# Patient Record
Sex: Male | Born: 1935 | Race: White | Hispanic: No | Marital: Married | Smoking: Former smoker
Health system: Southern US, Community
[De-identification: ages and names within clinical notes are randomized; demographics above are authoritative.]

## PROBLEM LIST (undated history)

## (undated) DIAGNOSIS — K219 Gastro-esophageal reflux disease without esophagitis: Secondary | ICD-10-CM

## (undated) DIAGNOSIS — E785 Hyperlipidemia, unspecified: Secondary | ICD-10-CM

## (undated) DIAGNOSIS — F32A Depression, unspecified: Secondary | ICD-10-CM

## (undated) DIAGNOSIS — I482 Chronic atrial fibrillation, unspecified: Secondary | ICD-10-CM

## (undated) DIAGNOSIS — Z8659 Personal history of other mental and behavioral disorders: Secondary | ICD-10-CM

## (undated) DIAGNOSIS — I10 Essential (primary) hypertension: Secondary | ICD-10-CM

## (undated) DIAGNOSIS — N183 Chronic kidney disease, stage 3 unspecified: Secondary | ICD-10-CM

## (undated) DIAGNOSIS — F329 Major depressive disorder, single episode, unspecified: Secondary | ICD-10-CM

## (undated) DIAGNOSIS — I5032 Chronic diastolic (congestive) heart failure: Secondary | ICD-10-CM

## (undated) DIAGNOSIS — I714 Abdominal aortic aneurysm, without rupture, unspecified: Secondary | ICD-10-CM

## (undated) DIAGNOSIS — C801 Malignant (primary) neoplasm, unspecified: Secondary | ICD-10-CM

## (undated) DIAGNOSIS — I2510 Atherosclerotic heart disease of native coronary artery without angina pectoris: Secondary | ICD-10-CM

## (undated) DIAGNOSIS — I495 Sick sinus syndrome: Secondary | ICD-10-CM

## (undated) DIAGNOSIS — I739 Peripheral vascular disease, unspecified: Secondary | ICD-10-CM

## (undated) DIAGNOSIS — J449 Chronic obstructive pulmonary disease, unspecified: Secondary | ICD-10-CM

## (undated) DIAGNOSIS — G4730 Sleep apnea, unspecified: Secondary | ICD-10-CM

## (undated) DIAGNOSIS — S82899A Other fracture of unspecified lower leg, initial encounter for closed fracture: Secondary | ICD-10-CM

## (undated) HISTORY — DX: Abdominal aortic aneurysm, without rupture, unspecified: I71.40

## (undated) HISTORY — PX: CORONARY ARTERY BYPASS GRAFT: SHX141

## (undated) HISTORY — PX: ABDOMINAL AORTIC ANEURYSM REPAIR: SUR1152

## (undated) HISTORY — DX: Chronic kidney disease, stage 3 unspecified: N18.30

## (undated) HISTORY — PX: INSERT / REPLACE / REMOVE PACEMAKER: SUR710

## (undated) HISTORY — DX: Chronic kidney disease, stage 3 (moderate): N18.3

## (undated) HISTORY — DX: Chronic obstructive pulmonary disease, unspecified: J44.9

## (undated) HISTORY — PX: TRIGGER FINGER RELEASE: SHX641

## (undated) HISTORY — DX: Sleep apnea, unspecified: G47.30

## (undated) HISTORY — DX: Malignant (primary) neoplasm, unspecified: C80.1

## (undated) HISTORY — DX: Hyperlipidemia, unspecified: E78.5

## (undated) HISTORY — DX: Gastro-esophageal reflux disease without esophagitis: K21.9

## (undated) HISTORY — PX: ANKLE SURGERY: SHX546

## (undated) HISTORY — DX: Sick sinus syndrome: I49.5

## (undated) HISTORY — PX: OTHER SURGICAL HISTORY: SHX169

## (undated) HISTORY — DX: Essential (primary) hypertension: I10

## (undated) HISTORY — DX: Peripheral vascular disease, unspecified: I73.9

## (undated) HISTORY — DX: Chronic diastolic (congestive) heart failure: I50.32

## (undated) HISTORY — DX: Atherosclerotic heart disease of native coronary artery without angina pectoris: I25.10

## (undated) HISTORY — DX: Other fracture of unspecified lower leg, initial encounter for closed fracture: S82.899A

## (undated) HISTORY — PX: FOOT SURGERY: SHX648

## (undated) HISTORY — DX: Chronic atrial fibrillation, unspecified: I48.20

## (undated) HISTORY — DX: Abdominal aortic aneurysm, without rupture: I71.4

---

## 1997-08-19 ENCOUNTER — Inpatient Hospital Stay (HOSPITAL_COMMUNITY): Admission: EM | Admit: 1997-08-19 | Discharge: 1997-08-30 | Payer: Self-pay | Admitting: Cardiology

## 1997-10-01 ENCOUNTER — Inpatient Hospital Stay (HOSPITAL_COMMUNITY): Admission: RE | Admit: 1997-10-01 | Discharge: 1997-10-06 | Payer: Self-pay | Admitting: Vascular Surgery

## 1997-11-16 ENCOUNTER — Emergency Department (HOSPITAL_COMMUNITY): Admission: EM | Admit: 1997-11-16 | Discharge: 1997-11-16 | Payer: Self-pay | Admitting: Emergency Medicine

## 2002-12-30 ENCOUNTER — Encounter: Payer: Self-pay | Admitting: Neurosurgery

## 2003-01-01 ENCOUNTER — Inpatient Hospital Stay (HOSPITAL_COMMUNITY): Admission: RE | Admit: 2003-01-01 | Discharge: 2003-01-02 | Payer: Self-pay | Admitting: Neurosurgery

## 2003-01-01 ENCOUNTER — Encounter: Payer: Self-pay | Admitting: Neurosurgery

## 2003-09-26 ENCOUNTER — Other Ambulatory Visit: Payer: Self-pay

## 2005-01-19 ENCOUNTER — Ambulatory Visit: Payer: Self-pay | Admitting: Chiropractic Medicine

## 2005-02-25 ENCOUNTER — Ambulatory Visit: Payer: Self-pay | Admitting: Family Medicine

## 2005-12-13 ENCOUNTER — Inpatient Hospital Stay: Payer: Self-pay | Admitting: Internal Medicine

## 2005-12-14 ENCOUNTER — Inpatient Hospital Stay (HOSPITAL_COMMUNITY): Admission: AD | Admit: 2005-12-14 | Discharge: 2005-12-16 | Payer: Self-pay | Admitting: *Deleted

## 2005-12-15 ENCOUNTER — Encounter: Payer: Self-pay | Admitting: Cardiovascular Disease

## 2006-07-23 ENCOUNTER — Emergency Department: Payer: Self-pay | Admitting: Emergency Medicine

## 2007-01-15 ENCOUNTER — Ambulatory Visit: Payer: Self-pay | Admitting: Specialist

## 2007-01-15 ENCOUNTER — Other Ambulatory Visit: Payer: Self-pay

## 2007-01-22 ENCOUNTER — Inpatient Hospital Stay: Payer: Self-pay | Admitting: Internal Medicine

## 2007-02-19 ENCOUNTER — Inpatient Hospital Stay: Payer: Self-pay | Admitting: Specialist

## 2007-03-06 ENCOUNTER — Ambulatory Visit: Payer: Self-pay | Admitting: Unknown Physician Specialty

## 2007-03-13 ENCOUNTER — Ambulatory Visit: Payer: Self-pay | Admitting: *Deleted

## 2007-06-16 ENCOUNTER — Inpatient Hospital Stay: Payer: Self-pay | Admitting: Internal Medicine

## 2007-06-16 ENCOUNTER — Other Ambulatory Visit: Payer: Self-pay

## 2007-06-21 ENCOUNTER — Encounter: Payer: Self-pay | Admitting: Cardiovascular Disease

## 2007-06-21 ENCOUNTER — Encounter: Payer: Self-pay | Admitting: Internal Medicine

## 2007-09-10 ENCOUNTER — Emergency Department: Payer: Self-pay | Admitting: Emergency Medicine

## 2007-09-10 ENCOUNTER — Other Ambulatory Visit: Payer: Self-pay

## 2008-01-05 ENCOUNTER — Emergency Department: Payer: Self-pay | Admitting: Emergency Medicine

## 2008-04-23 ENCOUNTER — Inpatient Hospital Stay: Payer: Self-pay | Admitting: Internal Medicine

## 2008-04-30 ENCOUNTER — Ambulatory Visit: Payer: Self-pay | Admitting: Otolaryngology

## 2008-05-15 ENCOUNTER — Inpatient Hospital Stay (HOSPITAL_COMMUNITY): Admission: AD | Admit: 2008-05-15 | Discharge: 2008-05-17 | Payer: Self-pay | Admitting: Cardiovascular Disease

## 2008-05-16 ENCOUNTER — Encounter: Payer: Self-pay | Admitting: Internal Medicine

## 2008-05-16 ENCOUNTER — Encounter: Payer: Self-pay | Admitting: Cardiovascular Disease

## 2008-06-12 ENCOUNTER — Encounter: Payer: Self-pay | Admitting: Cardiovascular Disease

## 2008-09-18 ENCOUNTER — Encounter: Payer: Self-pay | Admitting: Cardiovascular Disease

## 2008-09-19 ENCOUNTER — Encounter: Payer: Self-pay | Admitting: Internal Medicine

## 2008-09-22 ENCOUNTER — Encounter: Payer: Self-pay | Admitting: Cardiovascular Disease

## 2008-12-29 ENCOUNTER — Observation Stay: Payer: Self-pay | Admitting: Internal Medicine

## 2009-01-01 ENCOUNTER — Encounter: Payer: Self-pay | Admitting: Cardiovascular Disease

## 2009-02-02 ENCOUNTER — Encounter: Payer: Self-pay | Admitting: Family Medicine

## 2009-02-09 ENCOUNTER — Encounter: Payer: Self-pay | Admitting: Family Medicine

## 2009-03-11 ENCOUNTER — Encounter: Payer: Self-pay | Admitting: Family Medicine

## 2009-06-12 ENCOUNTER — Ambulatory Visit: Payer: Self-pay | Admitting: Cardiovascular Disease

## 2009-06-12 ENCOUNTER — Telehealth: Payer: Self-pay | Admitting: Cardiovascular Disease

## 2009-06-12 DIAGNOSIS — I509 Heart failure, unspecified: Secondary | ICD-10-CM | POA: Insufficient documentation

## 2009-06-12 DIAGNOSIS — I4891 Unspecified atrial fibrillation: Secondary | ICD-10-CM | POA: Insufficient documentation

## 2009-06-12 DIAGNOSIS — I10 Essential (primary) hypertension: Secondary | ICD-10-CM | POA: Insufficient documentation

## 2009-06-12 DIAGNOSIS — I25810 Atherosclerosis of coronary artery bypass graft(s) without angina pectoris: Secondary | ICD-10-CM

## 2009-06-12 DIAGNOSIS — I714 Abdominal aortic aneurysm, without rupture, unspecified: Secondary | ICD-10-CM | POA: Insufficient documentation

## 2009-06-12 DIAGNOSIS — E785 Hyperlipidemia, unspecified: Secondary | ICD-10-CM | POA: Insufficient documentation

## 2009-06-12 DIAGNOSIS — Z950 Presence of cardiac pacemaker: Secondary | ICD-10-CM | POA: Insufficient documentation

## 2009-06-12 DIAGNOSIS — K219 Gastro-esophageal reflux disease without esophagitis: Secondary | ICD-10-CM

## 2009-06-12 DIAGNOSIS — J449 Chronic obstructive pulmonary disease, unspecified: Secondary | ICD-10-CM

## 2009-06-12 DIAGNOSIS — J4489 Other specified chronic obstructive pulmonary disease: Secondary | ICD-10-CM | POA: Insufficient documentation

## 2009-06-15 ENCOUNTER — Encounter: Payer: Self-pay | Admitting: Cardiovascular Disease

## 2009-06-22 ENCOUNTER — Ambulatory Visit: Payer: Self-pay | Admitting: Internal Medicine

## 2009-07-20 ENCOUNTER — Encounter: Payer: Self-pay | Admitting: Internal Medicine

## 2009-08-04 ENCOUNTER — Encounter: Payer: Self-pay | Admitting: Cardiovascular Disease

## 2009-08-06 ENCOUNTER — Emergency Department: Payer: Medicare Other | Admitting: Emergency Medicine

## 2009-08-13 ENCOUNTER — Emergency Department: Payer: Medicare Other | Admitting: Emergency Medicine

## 2009-10-15 ENCOUNTER — Ambulatory Visit: Payer: Self-pay | Admitting: Cardiovascular Disease

## 2009-10-24 ENCOUNTER — Inpatient Hospital Stay: Payer: Medicare Other | Admitting: Psychiatry

## 2009-11-15 ENCOUNTER — Ambulatory Visit: Payer: Self-pay | Admitting: Internal Medicine

## 2009-11-15 ENCOUNTER — Observation Stay (HOSPITAL_COMMUNITY): Admission: EM | Admit: 2009-11-15 | Discharge: 2009-11-16 | Payer: Self-pay | Admitting: Emergency Medicine

## 2010-04-21 ENCOUNTER — Encounter: Payer: Self-pay | Admitting: Cardiovascular Disease

## 2010-05-09 LAB — CONVERTED CEMR LAB
BUN: 22 mg/dL
Basophils Relative: 1 %
Eosinophils Relative: 4 %
Glucose, Bld: 104 mg/dL
HCT: 35.7 %
Hemoglobin: 12 g/dL
Lymphocytes, automated: 26 %
MCV: 86 fL
Monocytes Relative: 12 %
WBC: 7.1 10*3/uL

## 2010-05-11 NOTE — Assessment & Plan Note (Signed)
Summary: NEP/AMD   Visit Type:  New EP pt Referring Provider:  Mariah Milling Primary Provider:  Maxwell Marion  CC:  no cp; some shortness of breath and no edema .  History of Present Illness: 77 year old gentleman known to me from [facility] with a history of coronary artery disease, bypass surgery in 1999 with catheterization in 2007 with patent LIMA to the LAD, occluded vein graft to the OM, history of Cypher stent to the PL branch and mid RCA in September 07, ejection fraction 45-50%, chronic atrial fibrillation with Medtronic pacemaker not on Coumadin due to recurrent falls and injury, possible alcohol abuse. Pacemaker was placed by Dr. Lynnea Ferrier on 25 2010 for syncope, AFib with pauses. The patient states that his episodes of syncope have improved since his PPM was placed.  No other complaints. He states that he is trying to reduce his ETOH intake.   Current Problems (verified): 1)  Aaa  (ICD-441.4) 2)  Essential Hypertension  (ICD-401.9) 3)  Hyperlipidemia  (ICD-272.4) 4)  Gerd  (ICD-530.81) 5)  COPD  (ICD-496) 6)  Accidental Falls, Recurrent  (ICD-E888.9) 7)  Pacemaker, Permanent  (ICD-V45.01) 8)  Atrial Fibrillation  (ICD-427.31) 9)  Coronary Atherosclerosis of Artery Bypass Graft  (ICD-414.04) 10)  CHF  (ICD-428.0)  Current Medications (verified): 1)  Omeprazole 20 Mg Cpdr (Omeprazole) .[name].. 1 Tab Daily 2)  Aspirin 81 Mg Chew (Aspirin) .[name].. 1 Tab Daily 3)  Cartia Xt 180 Mg Xr24h-Cap (Diltiazem Hcl Coated Beads) .[name].. 1 Tab Daily 4)  Prozac 20 Mg Caps (Fluoxetine Hcl) .[name].. 1 Tab Daily 5)  Metoprolol Succinate 50 Mg Xr24h-Tab (Metoprolol Succinate) .... Take One Tablet By Mouth Daily 6)  Crestor 5 Mg Tabs (Rosuvastatin Calcium) .... Take One Tablet By Mouth 4 Times A Week  Allergies (verified): 1)  ! Lipitor  Past History:  Past Medical History: Last updated: 06/12/2009 CAD -- bypass 1999 CHF - ECHO 09/2008 EF 45-50% chronic a fib, sp pacemaker  implantation witrh medtronic sensia recurrent falls, not a coumadin candidate COPD GERD hyperlipidemia HTN AAA --repair 1999  Past Surgical History: Last updated: 06/12/2009 CABG x2 1999 cardiac cath 2007: patent LIMA to the LAD and an occluded saphenous vein graft to an OM h/o cyper stent placement to a PL branch into the mid RCA in Sept. AAA repair L hip replacement appendectomy tonsilectomy pacemaker  Risk Factors: Alcohol Use: 1 (06/12/2009) Caffeine Use: 2 (06/12/2009) Exercise: no (06/12/2009)  Risk Factors: Smoking Status: quit > 6 months (06/12/2009) Packs/Day: 1.5 (06/12/2009)  Review of Systems  The patient denies chest pain, syncope, dyspnea on exertion, and peripheral edema.    Vital Signs:  Patient profile:   77 year old male Height:      73 inches Weight:      236.50 pounds BMI:     31.32 Pulse rate:   78 / minute Pulse rhythm:   regular BP sitting:   124 / 76  (left arm) Cuff size:   regular  Vitals Entered By: [name] (June 22, 2009 10:52 AM)  Physical Exam  General:  tall elderly gentleman in no apparent distress, HEENT exam is benign.  neck is supple with no JVP or carotid bruits  heart sounds are regular with S1-S2 and no murmurs appreciated lungs are clear to auscultation with no wheezes Rales. Well healed PPM incision.  abdominal exam is benign heart for morbid obesity, no significant lower extremity pulses are equal and symmetrical in his upper and lower extremities, neurologic exam  is grossly nonfocal, skin is warm and dry. He was able to walk without a cane though had slight balance instability.   PPM Specifications Following MD:  Lewayne Bunting, MD     PPM Vendor:  Medtronic     PPM Model Number:  [id number]     PPM Serial Number:  [id number] PPM DOI:  05/16/2008     PPM Implanting MD:  NOT IMPLANTED BY [place]  Lead 1    Location: RV     DOI: 05/16/2008     Model #: [id number]     Serial #: [id number]     Status: active  Magnet Response  Rate:  BOL 85 ERI  65  Indications:  A-fib   PPM Follow Up Remote Check?  No Battery Voltage:  2.78 V     Battery Est. Longevity:  6.5 years     Pacer Dependent:  No     Right Ventricle  Amplitude: 15.68 mV, Impedance: 613 ohms, Threshold: 0.75 V at 0.4 msec  Episodes Coumadin:  No Ventricular Pacing:  76.2%  Parameters Mode:  VVIR     Lower Rate Limit:  60     Upper Rate Limit:  130 Next Cardiology Appt Due:  12/10/2009 Tech Comments:  RV 2.5@ 0.4.  Device function normal.  The patient does c/o some soreness at the site when he lays on that side.  Dr. Ladona Ridgel will address that with him.  ROV 6 months in the [place] [facility]. Altha Harm, LPN  June 22, 2009 11:10 AM  MD Comments:  Agree with above.  ICD Specifications Following MD:  Lewayne Bunting, MD     ICD Vendor:  Medtronic     Impression & Recommendations:  Problem # 1:  ATRIAL FIBRILLATION (ICD-427.31) He will continue his ASA.  He is not a coumadin candidate and his rate is well controlled. His updated medication list for this problem includes:    Aspirin 81 Mg Chew (Aspirin) .[name]... 1 tab daily    Metoprolol Succinate 50 Mg Xr24h-tab (Metoprolol succinate) .[name]... Take one tablet by mouth daily  Problem # 2:  ESSENTIAL HYPERTENSION (ICD-401.9) He will continue his current meds and maintain a low sodium diet. His updated medication list for this problem includes:    Aspirin 81 Mg Chew (Aspirin) .[name]... 1 tab daily    Cartia Xt 180 Mg Xr24h-cap (Diltiazem hcl coated beads) .[name]... 1 tab daily    Metoprolol Succinate 50 Mg Xr24h-tab (Metoprolol succinate) .[name]... Take one tablet by mouth daily  Problem # 3:  PACEMAKER, PERMANENT (ICD-V45.01) His device is working normally.  Will recheck in several months.  Patient Instructions: 1)  Your physician recommends that you schedule a follow-up appointment in: 6 months with [name] 2)  Your physician recommends that you continue on your current medications as directed. Please refer to the  Current Medication list given to you today.

## 2010-05-11 NOTE — Assessment & Plan Note (Signed)
Summary: [name]   Visit Type:  Follow-up Referring Provider:  Mariah Milling Primary Provider:  Maxwell Marion  CC:  c/o soreness at incision from pacemaker.[name]  History of Present Illness: 77 year old gentleman known to me from [facility] with a history of coronary artery disease, bypass surgery in 1999 with catheterization in 2007 with patent LIMA to the LAD, occluded vein graft to the OM, history of Cypher stent to the PL branch and mid RCA in September 07, ejection fraction 45-50%, chronic atrial fibrillation with Medtronic pacemaker not on Coumadin due to recurrent falls and injury, possible alcohol abuse. Pacemaker was placed by Dr. Lynnea Ferrier on 25 2010 for syncope, AFib with pauses.  The patient states that his episodes of syncope have improved since his PPM was placed.    he does have a history of alcohol abuse per his family though he denies that he drinks that much and reports that his gait instability and history of falls with fractures and trauma to his face has been due to poor balance. He denies any recent falls but he states that his legs and balance her stool poor. He usually walks with a cane though did not bring with him today.  Overall he has no new complaints. No symptoms of chest discomfort, no shortness of breath, no significant lower extremity edema. He does not do much during the daytime, does not exercise or get out of the house much. He states his wife is always getting on him to do something.   Current Medications (verified): 1)  Omeprazole 20 Mg Cpdr (Omeprazole) .[name].. 1 Tab Daily 2)  Aspirin 81 Mg Chew (Aspirin) .... Two Tablets Once Daily 3)  Diltiazem Hcl Cr 120 Mg Xr12h-Cap (Diltiazem Hcl) .[name].. 1 Once Daily 4)  Prozac 20 Mg Caps (Fluoxetine Hcl) .[name].. 1 Tab Daily 5)  Metoprolol Succinate 50 Mg Xr24h-Tab (Metoprolol Succinate) .... Take One Tablet By Mouth Daily 6)  Pravachol 20 Mg Tabs (Pravastatin Sodium) .[name].. 1 Once Daily  Allergies (verified): 1)   ! Lipitor  Past History:  Past Medical History: Last updated: 06/15/2009 CAD -- bypass 1999 CHF - ECHO 09/2008 EF 45-50% chronic a fib, sp pacemaker implantation witrh medtronic sensia recurrent falls, not a coumadin candidate COPD GERD hyperlipidemia HTN AAA --repair 1999  Past Surgical History: Last updated: 06/15/2009 CABG x2 1999 cardiac cath 2007: patent LIMA to the LAD and an occluded saphenous vein graft to an OM h/o cyper stent placement to a PL branch into the mid RCA in Sept. AAA repair L hip replacement appendectomy tonsilectomy pacemaker  Family History: Last updated: 2009-06-15 mother died of stomach CA at 73 father died at 9 of cerebral hemorrhage family hx of DM, HTN, CAD  Social History: Last updated: 15-Jun-2009 retired Scientist, product/process development married quit smoking in 1999 EtOH abuse  Risk Factors: Alcohol Use: 1 (2009-06-15) Caffeine Use: 2 (2009/06/15) Exercise: no (2009-06-15)  Risk Factors: Smoking Status: quit > 6 months (Jun 15, 2009) Packs/Day: 1.5 (June 15, 2009)  Review of Systems       The patient complains of difficulty walking.  The patient denies fever, weight loss, weight gain, vision loss, decreased hearing, hoarseness, chest pain, syncope, dyspnea on exertion, peripheral edema, prolonged cough, abdominal pain, incontinence, muscle weakness, depression, and enlarged lymph nodes.    Vital Signs:  Patient profile:   77 year old male Height:      73 inches Weight:      229 pounds BMI:     30.32 Pulse rate:   86 /  minute BP sitting:   144 / 87  (left arm) Cuff size:   regular  Vitals Entered By: Bishop Dublin, CMA (October 15, 2009 10:42 AM)  Physical Exam  General:  Well developed, well nourished, in no acute distress. Head:  normocephalic and atraumatic Neck:  Neck supple, no JVD. No masses, thyromegaly or abnormal cervical nodes. Lungs:  Clear bilaterally to auscultation and percussion. Heart:  Non-displaced PMI, chest non-tender;  regular rate and rhythm, S1, S2 without murmurs, rubs or gallops. Carotid upstroke normal, no bruit. Pedals normal pulses. No edema, no varicosities. Abdomen:  Bowel sounds positive; abdomen soft and non-tender without masses Msk:  Back normal, normal gait. Muscle strength and tone normal. Pulses:  mildly diminished pulses in his lower extremities Extremities:  No clubbing or cyanosis. Neurologic:  Alert and oriented x 3. Skin:  Intact without lesions or rashes. Psych:  Normal affect.    PPM Specifications Following MD:  Lewayne Bunting, MD     PPM Vendor:  Medtronic     PPM Model Number:  [id number]     PPM Serial Number:  [id number] PPM DOI:  05/16/2008     PPM Implanting MD:  NOT IMPLANTED BY [place]  Lead 1    Location: RV     DOI: 05/16/2008     Model #: [id number]     Serial #: [id number]     Status: active  Magnet Response Rate:  BOL 85 ERI  65  Indications:  A-fib   PPM Follow Up Pacer Dependent:  No      Episodes Coumadin:  No  Parameters Mode:  VVIR     Lower Rate Limit:  60     Upper Rate Limit:  130  ICD Specifications Following MD:  Lewayne Bunting, MD     ICD Vendor:  Medtronic     Impression & Recommendations:  Problem # 1:  CORONARY ATHEROSCLEROSIS OF ARTERY BYPASS GRAFT (ICD-414.04) no symptoms suggestive of angina. He is not very active and we have encouraged him to do more walking with his cane. He does have significant gait instability and history of falls. We will continue him on his current medication.  His updated medication list for this problem includes:    Aspirin 81 Mg Chew (Aspirin) .[name]..[name] Two tablets once daily    Diltiazem Hcl Cr 120 Mg Xr12h-cap (Diltiazem hcl) .[name]... 1 once daily    Metoprolol Succinate 50 Mg Xr24h-tab (Metoprolol succinate) .[name]... Take one tablet by mouth daily  Problem # 2:  HYPERLIPIDEMIA (ICD-272.4) We will try to obtain his most recent lipid panel. It is important that his cholesterol is less than 150, LDL less than 70 given his underlying  coronary artery disease and history of bypass.  The following medications were removed from the medication list:    Crestor 5 Mg Tabs (Rosuvastatin calcium) .[name]... Take one tablet by mouth 4 times a week His updated medication list for this problem includes:    Pravachol 20 Mg Tabs (Pravastatin sodium) .[name]... 1 once daily  Problem # 3:  ESSENTIAL HYPERTENSION (ICD-401.9) Blood pressure is reasonably well controlled on today's visit.  His updated medication list for this problem includes:    Aspirin 81 Mg Chew (Aspirin) .[name]..[name] Two tablets once daily    Diltiazem Hcl Cr 120 Mg Xr12h-cap (Diltiazem hcl) .[name]... 1 once daily    Metoprolol Succinate 50 Mg Xr24h-tab (Metoprolol succinate) .[name]... Take one tablet by mouth daily  Problem # 4:  PACEMAKER, PERMANENT (ICD-V45.01) He is followed  by Dr. Ladona Ridgel for his pacemaker. Recent evaluation suggests a pacemaker is working well. Mr. Sibert has no complaints of lightheadedness or dizziness.

## 2010-05-11 NOTE — Op Note (Signed)
Summary: Pacemaker  Pacemaker   Imported By: Harlon Flor 07/23/2009 10:56:13  _____________________________________________________________________  External Attachment:    Type:   Image     Comment:   External Document

## 2010-05-11 NOTE — Cardiovascular Report (Signed)
Summary: Cardiac Cath Other  Cardiac Cath Other   Imported By: Harlon Flor 06/15/2009 15:41:38  _____________________________________________________________________  External Attachment:    Type:   Image     Comment:   External Document

## 2010-05-11 NOTE — Assessment & Plan Note (Signed)
Summary: NP6/AMD   Visit Type:  Follow-up Primary Provider:  Maxwell Kennedy  CC:  no chest pain, SOB continues, no swelling since changing to salt substitute, and fell last night and lacerated eyebrow.  History of Present Illness: 77 year old gentleman known to me from [facility] with a history of coronary artery disease, bypass surgery in 1999 with catheterization in 2007 with patent LIMA to the LAD, occluded vein graft to the OM, history of Cypher stent to the PL branch and mid RCA in September 07, ejection fraction 45-50%, chronic itch or fibrillation with Medtronic pacemaker not on Coumadin due to recurrent falls and injury, possible alcohol abuse.  [name] has had admissions to [facility] for traumatic facial injury after falls. He presents again today after falling last night and has a laceration above his left eye. He did not seek medical attention. On previous falls, he's required plastic surgery for healing. His wife states that he drinks too much and wanted him "committed". He states that he does not drink much and that most of his falls come from his history of left hip repair and chronic right knee weakness.  Echocardiogram in June 2010 shows mildly dilated left atrium, ejection fraction 45-50%, diastolic relaxation abnormality, right ventricular systolic pressure 30-40 mm of mercury. Mild to moderate tricuspid regurgitation.  Stress test in June 2010 was a pharmacologic study showing no significant ischemia. Overall this was felt to be a low risk scan.  Pacemaker was placed by Dr. Lynnea Ferrier on 25 2010 for syncope, AFib with pauses. Medtronic serial number [id number]  Past catheterization in 9 2007.  Renal artery ultrasound in March 2009 was essentially normal.  Cholesterol in June 2010 shows total cholesterol 226, LDL 149, HDL 62, triglycerides 74. TSH 1.3 hemoglobin A1c 6.1  Preventive Screening-Counseling &  Management  Alcohol-Tobacco     Alcohol drinks/day: 1     Alcohol type: spirits     Smoking Status: quit > 6 months     Packs/Day: 1.5     Year Started: 1952     Year Quit: 1999  Caffeine-Diet-Exercise     Caffeine use/day: 2     Does Patient Exercise: no  Current Problems (verified): 1)  Aaa  (ICD-441.4) 2)  Essential Hypertension  (ICD-401.9) 3)  Hyperlipidemia  (ICD-272.4) 4)  Gerd  (ICD-530.81) 5)  COPD  (ICD-496) 6)  Accidental Falls, Recurrent  (ICD-E888.9) 7)  Pacemaker, Permanent  (ICD-V45.01) 8)  Atrial Fibrillation  (ICD-427.31) 9)  Coronary Atherosclerosis of Artery Bypass Graft  (ICD-414.04) 10)  CHF  (ICD-428.0)  Current Medications (verified): 1)  Omeprazole 20 Mg Cpdr (Omeprazole) .[name].. 1 Tab Daily 2)  Aspirin 81 Mg Chew (Aspirin) .[name].. 1 Tab Daily 3)  Cartia Xt 180 Mg Xr24h-Cap (Diltiazem Hcl Coated Beads) .[name].. 1 Tab Daily 4)  Prozac 20 Mg Caps (Fluoxetine Hcl) .[name].. 1 Tab Daily 5)  Metoprolol Succinate 50 Mg Xr24h-Tab (Metoprolol Succinate) .... Take One Tablet By Mouth Daily 6)  Crestor 5 Mg Tabs (Rosuvastatin Calcium) .... Take One Tablet By Mouth 2 Times A Week  Allergies (verified): 1)  ! Lipitor  Past History:  Family History: Last updated: 2009-07-12 mother died of stomach CA at 101 father died at 41 of cerebral hemorrhage family hx of DM, HTN, CAD  Social History: Last updated: July 12, 2009 retired Scientist, product/process development married quit smoking in 1999 EtOH abuse  Risk Factors: Alcohol Use: 1 (07-12-09) Caffeine Use: 2 (07-12-2009) Exercise: no (12-Jul-2009)  Risk Factors: Smoking  Status: quit > 6 months (06/12/2009) Packs/Day: 1.5 (06/12/2009)  Past Medical History: CAD -- bypass 1999 CHF - ECHO 09/2008 EF 45-50% chronic a fib, sp pacemaker implantation witrh medtronic sensia recurrent falls, not a coumadin candidate COPD GERD hyperlipidemia HTN AAA --repair 1999  Past Surgical History: CABG x2 1999 cardiac cath 2007: patent LIMA to  the LAD and an occluded saphenous vein graft to an OM h/o cyper stent placement to a PL branch into the mid RCA in Sept. AAA repair L hip replacement appendectomy tonsilectomy pacemaker  Family History: mother died of stomach CA at 71 father died at 11 of cerebral hemorrhage family hx of DM, HTN, CAD  Social History: retired Scientist, product/process development married quit smoking in 1999 EtOH abuse Alcohol drinks/day:  1 Smoking Status:  quit > 6 months Packs/Day:  1.5 Caffeine use/day:  2 Does Patient Exercise:  no  Review of Systems       The patient complains of chest pain.  The patient denies fever, weight loss, weight gain, vision loss, decreased hearing, hoarseness, syncope, dyspnea on exertion, peripheral edema, prolonged cough, headaches, abdominal pain, incontinence, muscle weakness, depression, and enlarged lymph nodes.         Rare epsodes of chest pain  Vital Signs:  Patient profile:   77 year old male Height:      73 inches Weight:      235.63 pounds BMI:     31.20 Pulse rate:   83 / minute Pulse rhythm:   regular BP sitting:   178 / 84  (left arm) Cuff size:   regular  Vitals Entered By: [name] (June 12, 2009 10:09 AM)  Physical Exam  General:  tall elderly gentleman in no apparent distress, HEENT exam is benign, laceration above his left eye that is new and 1 1/2 cm in length,otherwise alert and oriented, neck is supple with no JVP or carotid bruits, heart sounds are regular with S1-S2 and no murmurs appreciated, lungs are clear to auscultation with no wheezes Rales, abdominal exam is benign heart for morbid obesity, no significant lower extremity pulses are equal and symmetrical in his upper and lower extremities, neurologic exam is grossly nonfocal, skin is warm and dry. He was able to walk without a cane though had slight balance instability.   EKG  Procedure date:  06/12/2009  Findings:      Ventricular paced rhythm with ventricular rate 83 beats per  minute.  Impression & Recommendations:  Problem # 1:  ACCIDENTAL FALLS, RECURRENT (ICD-E888.9) His wife reports a long alcohol history. The patient denies that he drinks that much. He blames it on his left hip surgery and has chronic right knee weakness and discomfort. He frequently states that he falls from tripping over items at home. He uses his cane occasionally. We will continue to watch him. I suggested that he watches drinking. I suspect that he drinks more than he is letting on.  Problem # 2:  CORONARY ATHEROSCLEROSIS OF ARTERY BYPASS GRAFT (ICD-414.04) history of coronary artery disease as detailed above. He had a stress test in June 2010 that showed no ischemia. His cholesterol is not well-controlled and he only takes Crestor 2 times per week. He denies any significant myalgias. We have suggested that he increase the dose of his Crestor to 5 times per week and titrate this up to every day 5 mg if tolerated. His LFTs were normal in June of 2010.  His updated medication list for this problem includes:  Aspirin 81 Mg Chew (Aspirin) .[name]... 1 tab daily    Cartia Xt 180 Mg Xr24h-cap (Diltiazem hcl coated beads) .[name]... 1 tab daily    Metoprolol Succinate 50 Mg Xr24h-tab (Metoprolol succinate) .[name]... Take one tablet by mouth daily  Problem # 3:  ESSENTIAL HYPERTENSION (ICD-401.9) Blood pressure is elevated today he states that he has been missing doses of medications. He did not take his medications today. He is due to take metoprolol and Cartia. Rest and to monitor his blood pressures at home to mention that he is not running above goal. His updated medication list for this problem includes:    Aspirin 81 Mg Chew (Aspirin) .[name]... 1 tab daily    Cartia Xt 180 Mg Xr24h-cap (Diltiazem hcl coated beads) .[name]... 1 tab daily    Metoprolol Succinate 50 Mg Xr24h-tab (Metoprolol succinate) .[name]... Take one tablet by mouth daily  Problem # 4:  PACEMAKER, PERMANENT (ICD-V45.01) he is to have his pacemaker  checked in [place], last checked in September 2010. He's indicated that he would like to have it checked in [place] with Dr. Graciela Husbands. We will arrange this for him. His Cartia was increased on the last visit with Dr. Lynnea Ferrier for additional rate control.    Echocardiogram  Procedure date:  09/22/2008  Findings:      AF with intermittent ventricular paced rhythm Left ventricular systolic function is mildly reduced EF 45-50%  The trasmitral spectral Doppler flow pattern is suggestive of impraied LV relaxation Regional wall motion abnormality may reflect pacemaker activation There is a pacemaker lead in the R ventricle The LA is mildly dilated There is mild to moderate tricuspid regurg R ventricular systolic pressure is elevated at 33 mmHg Trivial pericardial effusion  Exercise Stress Test  Procedure date:  09/22/2008  Findings:      pharmacological myocardial perfusion imagine study with no evidence of significant ischemia.  There is a small to moderate sized region og scar noted in the mid to distal inferior and infero-apical region.  The post stress left ventricle is normal in size.  non gated study secondary to ectopy  pharmacological stress test without chest pain or EKG changes for ischemia. frequent PVC's. EKG is negative for ischemia.  no significant ischemia demonstrated.  region of scar noted.  this is a low risk scan.  prior study not available for comparsion.  Pacemaker Information  Procedure date:  05/16/2008  Findings:      implanted device: PPM generator- manufacturer: medtronic model # [id number], serial # [id number] Ventricular lead- manufacturer medtronicm model # [id number], serial # [id number]  Cardiac Cath  Procedure date:  12/15/2005  Findings:      successful placement of a Cypher 2.5x8 mm stent in the proximal posterolateral artery lesion successful placement of a Cypher 3.5x18 mm stent in the md right coronary artery stenotic lesion, ultimately post  dilated to 3.85 mm patent left internal mammary artery to the left anterior descending with diffuse apical left anterior descending totally occluded vein graft to the obtuse marginal significant 2 vessel coronary artery disease mild depressed left ventricle systolic function patent abdominal aortic graft eighty percent left renal artery stenosis adjuvant use of integrillin infusion  Renal US  Procedure date:  06/21/2007  Findings:      right and left renal arteries: no evidence of significant diameter reduction, dissection, tortuosity or any other vascular abnormality  right and left kidneys: essentially equal in size, symmetrical in shape with normal cortex and medulla nad no abnormal echogenicity or hydronephrosis  this is essentially a normal renal duplex Doppler evaluation  -  Date:  09/18/2008    WBC: 7.1    HGB: 12.0    HCT: 35.7    RBC: 4.17    PLT: 236    MCV: 86    RDW: 17.2    Neutrophil: 57    Lymphs: 26    Monos: 12    Eos: 4    Basophil: 1    BG Random: 104    BUN: 22    Creatinine: 1.33

## 2010-05-11 NOTE — Progress Notes (Signed)
Summary: FYI  Phone Note Call from Patient Call back at Home Phone [phone]   Caller: WIFE Call For: [facility] Summary of Call: PTS WIFE WOULD LIKE FOR DR Mariah Milling TO CALL HER BEFORE HE SEES MR [facility]-THERE IS SOMETHING THAT SHE WANTS TO TELL HIM BEFORE THE VISIT AND SHE WAS UNABLE TO COME WITH HIM Initial call taken by: Harlon Flor,  June 12, 2009 9:38 AM  Follow-up for Phone Call        drinking issue- last night pt fell because he was drinking, mind is gone especially at evening time, pts wife would like for him to have tests done "on his head"  Follow-up by: Charlena Cross, RN, BSN,  June 12, 2009 9:43 AM

## 2010-05-11 NOTE — Progress Notes (Signed)
Summary: PHI  PHI   Imported By: Harlon Flor 07/23/2009 10:56:36  _____________________________________________________________________  External Attachment:    Type:   Image     Comment:   External Document

## 2010-05-11 NOTE — Progress Notes (Signed)
Summary: PHI  PHI   Imported By: Harlon Flor 06/15/2009 15:46:47  _____________________________________________________________________  External Attachment:    Type:   Image     Comment:   External Document

## 2010-05-12 ENCOUNTER — Encounter: Payer: Self-pay | Admitting: Cardiovascular Disease

## 2010-05-14 ENCOUNTER — Encounter: Payer: Self-pay | Admitting: Cardiovascular Disease

## 2010-05-14 ENCOUNTER — Ambulatory Visit (INDEPENDENT_AMBULATORY_CARE_PROVIDER_SITE_OTHER): Payer: MEDICARE | Admitting: Cardiovascular Disease

## 2010-05-14 DIAGNOSIS — E785 Hyperlipidemia, unspecified: Secondary | ICD-10-CM

## 2010-05-14 DIAGNOSIS — I2510 Atherosclerotic heart disease of native coronary artery without angina pectoris: Secondary | ICD-10-CM

## 2010-05-19 NOTE — Assessment & Plan Note (Signed)
Summary: Surgical Clearance for Knee Surgery/AMD   Vital Signs:  Patient profile:   77 year old male Height:      73 inches Weight:      234 pounds BMI:     30.98 Pulse rate:   85 / minute BP sitting:   121 / 78  (left arm) Cuff size:   regular  Vitals Entered By: Bishop Dublin, CMA (May 14, 2010 2:12 PM)  Visit Type:  Follow-up Referring Provider:  Mariah Milling Primary Provider:  Maxwell Marion  CC:  Surgical clearance for right knee surg.  Occas. has some chest pain and shortness of breath. .  History of Present Illness: 77 year old gentleman with a history of coronary artery disease, bypass surgery in 1999 with catheterization in 2007 with patent LIMA to the LAD, occluded vein graft to the OM, history of Cypher stent to the PL branch and mid RCA in September 07, ejection fraction 45-50%, chronic atrial fibrillation with Medtronic pacemaker not on Coumadin due to recurrent falls and injury from alcohol abuse. Pacemaker was placed by Dr. Lynnea Ferrier on 25 2010 for syncope, AFib with pauses.  Mr. [name] reports having shortness of breath at baseline with exertion "because I'm out of shape ". He denies any chest pain. He does not do any exercise. He does not take very good care of himself in general. He has alcohol problems and his family has caught him hiding alcohol bottles throughout his house and workshop. After he drinks, he frequent falls down and hurts himself. Previously he has had a large laceration to the left eye. On his visit today, he has had a small injury to his left hand. he continues to drive though it is uncertain if he drives while intoxicated. His family is worried he will hurt somebody and hurt himself.  He has spent a month and day alcohol rehabilitation facility but did not followup with Alcoholics Anonymous after discharge. He reports it was expensive to stay at the rehabilitation facility. He is not interested in attending another alcohol rehabilitation at this time.  he  states that he needs to have right knee replacement as soon as possible secondary to severe pain. Has difficulty ambulating secondary to his symptoms. This has been going on for several years and getting worse.  EKG shows paced rhythm at 85 beats per minute   Current Medications (verified): 1)  Omeprazole 20 Mg Cpdr (Omeprazole) .[name].. 1 Tab Daily 2)  Aspirin 81 Mg Chew (Aspirin) .... Two Tablets Once Daily 3)  Diltiazem Hcl Cr 120 Mg Xr12h-Cap (Diltiazem Hcl) .[name].. 1 Once Daily 4)  Prozac 20 Mg Caps (Fluoxetine Hcl) .[name].. 1 Tab Daily 5)  Metoprolol Succinate 50 Mg Xr24h-Tab (Metoprolol Succinate) .... Take One Tablet By Mouth Daily 6)  Pravachol 20 Mg Tabs (Pravastatin Sodium) .[name].. 1 Once Daily 7)  Lisinopril 10 Mg Tabs (Lisinopril) .... One Tablet Once Daily  Allergies (verified): 1)  ! Lipitor  Past History:  Past Medical History: Last updated: 06/12/2009 CAD -- bypass 1999 CHF - ECHO 09/2008 EF 45-50% chronic a fib, sp pacemaker implantation witrh medtronic sensia recurrent falls, not a coumadin candidate COPD GERD hyperlipidemia HTN AAA --repair 1999  Past Surgical History: Last updated: 06/12/2009 CABG x2 1999 cardiac cath 2007: patent LIMA to the LAD and an occluded saphenous vein graft to an OM h/o cyper stent placement to a PL branch into the mid RCA in Sept. AAA repair L hip replacement appendectomy tonsilectomy pacemaker  Family History: Last updated: 06/12/2009 mother  died of stomach CA at 80 father died at 5 of cerebral hemorrhage family hx of DM, HTN, CAD  Social History: Last updated: 06/12/2009 retired Scientist, product/process development married quit smoking in 1999 EtOH abuse  Risk Factors: Alcohol Use: 1 (06/12/2009) Caffeine Use: 2 (06/12/2009) Exercise: no (06/12/2009)  Risk Factors: Smoking Status: quit > 6 months (06/12/2009) Packs/Day: 1.5 (06/12/2009)  Review of Systems       The patient complains of dyspnea on exertion and difficulty walking.  The  patient denies fever, weight loss, weight gain, vision loss, decreased hearing, hoarseness, chest pain, syncope, peripheral edema, prolonged cough, abdominal pain, incontinence, muscle weakness, depression, and enlarged lymph nodes.         severe right knee pain  Physical Exam  General:  Well developed, well nourished, in no acute distress. Head:  normocephalic and atraumatic Neck:  Neck supple, no JVD. No masses, thyromegaly or abnormal cervical nodes. Lungs:  Clear bilaterally to auscultation and percussion. Heart:  Non-displaced PMI, chest non-tender; regular rate and rhythm, S1, S2 without murmurs, rubs or gallops. Carotid upstroke normal, no bruit. Pedals normal pulses. No edema, no varicosities. Abdomen:  Bowel sounds positive; abdomen soft and non-tender without masses Msk:  Back normal, normal gait. Muscle strength and tone normal. Pulses:  mildly diminished pulses in his lower extremities Extremities:  No clubbing or cyanosis. Neurologic:  Alert and oriented x 3. Skin:  rash on his face and forehead Psych:  Normal affect.   Impression & Recommendations:  Problem # 1:  ACCIDENTAL FALLS, RECURRENT (ICD-E888.9) we had a long discussion about his falls and alcohol problem. He will continue to try to quit. His wife would like him to take a medication for his memory. I have suggested he talk with Dr. Sheppard Penton or possibly a neurologist.  he has had a long problem with alcohol for many years and a recent stay in rehabilitation did not help. He did not followup with Alcoholics Anonymous. Have encouraged him to recheck with Alcoholics Anonymous. I do not think that he would have alcohol withdrawal with his upcoming surgery on his knee.  Problem # 2:  PRE-OPERATIVE CARDIAC EXAM (ICD-V72.81) he would be an acceptable risk for his upcoming total knee replacement. No further testing is needed. Would continue him on his current outpatient regimen.  His updated medication list for this problem  includes:    Aspirin 81 Mg Chew (Aspirin) .[name]..[name] Two tablets once daily    Diltiazem Hcl Cr 120 Mg Xr12h-cap (Diltiazem hcl) .[name]... 1 once daily    Metoprolol Succinate 50 Mg Xr24h-tab (Metoprolol succinate) .[name]... Take one tablet by mouth daily    Lisinopril 10 Mg Tabs (Lisinopril) ..... One tablet once daily  Problem # 3:  HYPERLIPIDEMIA (ICD-272.4) We'll try to obtain his most recent lipid panel from Dr. Evelene Croon. Goal LDL is less than 70.[name]  His updated medication list for this problem includes:    Pravachol 20 Mg Tabs (Pravastatin sodium) .[name]... 1 once daily  Problem # 4:  ESSENTIAL HYPERTENSION (ICD-401.9) Blood pressure is adequate on today's visit. No further medication changes made.  His updated medication list for this problem includes:    Aspirin 81 Mg Chew (Aspirin) .[name]..[name] Two tablets once daily    Diltiazem Hcl Cr 120 Mg Xr12h-cap (Diltiazem hcl) .[name]... 1 once daily    Metoprolol Succinate 50 Mg Xr24h-tab (Metoprolol succinate) .[name]... Take one tablet by mouth daily    Lisinopril 10 Mg Tabs (Lisinopril) ..... One tablet once daily  Other Orders: EKG  w/ Interpretation (93000)   Orders Added: 1)  EKG w/ Interpretation [93000]

## 2010-05-20 ENCOUNTER — Encounter (INDEPENDENT_AMBULATORY_CARE_PROVIDER_SITE_OTHER): Payer: MEDICARE

## 2010-05-20 ENCOUNTER — Encounter: Payer: Self-pay | Admitting: Internal Medicine

## 2010-05-20 ENCOUNTER — Ambulatory Visit: Payer: Medicare Other | Admitting: Family Medicine

## 2010-05-20 DIAGNOSIS — I4891 Unspecified atrial fibrillation: Secondary | ICD-10-CM

## 2010-05-21 ENCOUNTER — Encounter: Payer: Self-pay | Admitting: Cardiovascular Disease

## 2010-05-24 ENCOUNTER — Inpatient Hospital Stay (HOSPITAL_COMMUNITY)
Admission: RE | Admit: 2010-05-24 | Discharge: 2010-05-27 | DRG: 470 | Disposition: A | Discharge status: Skilled Nursing Facility | Payer: MEDICARE | Source: Ambulatory Visit | Attending: Orthopedic Surgery | Admitting: Orthopedic Surgery

## 2010-05-24 ENCOUNTER — Ambulatory Visit: Payer: Self-pay | Admitting: Cardiovascular Disease

## 2010-05-24 DIAGNOSIS — F1020 Alcohol dependence, uncomplicated: Secondary | ICD-10-CM | POA: Diagnosis present

## 2010-05-24 DIAGNOSIS — Z950 Presence of cardiac pacemaker: Secondary | ICD-10-CM

## 2010-05-24 DIAGNOSIS — Z79899 Other long term (current) drug therapy: Secondary | ICD-10-CM

## 2010-05-24 DIAGNOSIS — Z9861 Coronary angioplasty status: Secondary | ICD-10-CM

## 2010-05-24 DIAGNOSIS — I10 Essential (primary) hypertension: Secondary | ICD-10-CM | POA: Diagnosis present

## 2010-05-24 DIAGNOSIS — K219 Gastro-esophageal reflux disease without esophagitis: Secondary | ICD-10-CM | POA: Diagnosis present

## 2010-05-24 DIAGNOSIS — D62 Acute posthemorrhagic anemia: Secondary | ICD-10-CM | POA: Diagnosis not present

## 2010-05-24 DIAGNOSIS — Z87891 Personal history of nicotine dependence: Secondary | ICD-10-CM

## 2010-05-24 DIAGNOSIS — I4891 Unspecified atrial fibrillation: Secondary | ICD-10-CM | POA: Diagnosis present

## 2010-05-24 DIAGNOSIS — T40605A Adverse effect of unspecified narcotics, initial encounter: Secondary | ICD-10-CM | POA: Diagnosis not present

## 2010-05-24 DIAGNOSIS — Z7982 Long term (current) use of aspirin: Secondary | ICD-10-CM

## 2010-05-24 DIAGNOSIS — I2510 Atherosclerotic heart disease of native coronary artery without angina pectoris: Secondary | ICD-10-CM | POA: Diagnosis present

## 2010-05-24 DIAGNOSIS — Y9210 Unspecified residential institution as the place of occurrence of the external cause: Secondary | ICD-10-CM | POA: Diagnosis not present

## 2010-05-24 DIAGNOSIS — M1710 Unilateral primary osteoarthritis, unspecified knee: Principal | ICD-10-CM | POA: Diagnosis present

## 2010-05-24 DIAGNOSIS — F99 Mental disorder, not otherwise specified: Secondary | ICD-10-CM | POA: Diagnosis not present

## 2010-05-24 DIAGNOSIS — E785 Hyperlipidemia, unspecified: Secondary | ICD-10-CM | POA: Diagnosis present

## 2010-05-24 DIAGNOSIS — I509 Heart failure, unspecified: Secondary | ICD-10-CM | POA: Diagnosis present

## 2010-05-24 DIAGNOSIS — E669 Obesity, unspecified: Secondary | ICD-10-CM | POA: Diagnosis present

## 2010-05-24 LAB — COMPREHENSIVE METABOLIC PANEL
ALT: 13 U/L (ref 0–53)
Alkaline Phosphatase: 70 U/L (ref 39–117)
BUN: 19 mg/dL (ref 6–23)
CO2: 26 mEq/L (ref 19–32)
Calcium: 9.6 mg/dL (ref 8.4–10.5)
GFR calc non Af Amer: 48 mL/min — ABNORMAL LOW (ref >60)
Glucose, Bld: 96 mg/dL (ref 70–99)
Sodium: 140 mEq/L (ref 135–145)
Total Protein: 7.1 g/dL (ref 6.0–8.3)

## 2010-05-24 LAB — TYPE AND SCREEN
ABO/RH(D): A POS
Antibody Screen: NEGATIVE

## 2010-05-24 LAB — CBC
Hemoglobin: 13.2 g/dL (ref 13.0–17.0)
MCH: 30.7 pg (ref 26.0–34.0)
MCHC: 33.7 g/dL (ref 30.0–36.0)
RDW: 17.7 % — ABNORMAL HIGH (ref 11.5–15.5)

## 2010-05-24 LAB — DIFFERENTIAL
Basophils Absolute: 0.1 10*3/uL (ref 0.0–0.1)
Basophils Relative: 1 % (ref 0–1)
Eosinophils Absolute: 0.3 10*3/uL (ref 0.0–0.7)
Eosinophils Relative: 4 % (ref 0–5)
Monocytes Absolute: 0.9 10*3/uL (ref 0.1–1.0)
Monocytes Relative: 13 % — ABNORMAL HIGH (ref 3–12)

## 2010-05-24 LAB — PROTIME-INR
INR: 1.02 (ref 0.00–1.49)
Prothrombin Time: 13.6 seconds (ref 11.6–15.2)

## 2010-05-24 LAB — ABO/RH: ABO/RH(D): A POS

## 2010-05-25 ENCOUNTER — Ambulatory Visit: Payer: Self-pay | Admitting: Internal Medicine

## 2010-05-25 LAB — BASIC METABOLIC PANEL
BUN: 20 mg/dL (ref 6–23)
CO2: 27 mEq/L (ref 19–32)
Calcium: 8.7 mg/dL (ref 8.4–10.5)
Chloride: 101 mEq/L (ref 96–112)
Creatinine, Ser: 1.31 mg/dL (ref 0.4–1.5)

## 2010-05-25 LAB — CBC
Hemoglobin: 11 g/dL — ABNORMAL LOW (ref 13.0–17.0)
MCH: 31 pg (ref 26.0–34.0)
MCHC: 34.1 g/dL (ref 30.0–36.0)
MCV: 91 fL (ref 78.0–100.0)
RBC: 3.55 MIL/uL — ABNORMAL LOW (ref 4.22–5.81)

## 2010-05-26 ENCOUNTER — Inpatient Hospital Stay (HOSPITAL_COMMUNITY): Payer: MEDICARE

## 2010-05-26 LAB — CBC
MCH: 30.6 pg (ref 26.0–34.0)
MCHC: 33.9 g/dL (ref 30.0–36.0)
MCV: 90.1 fL (ref 78.0–100.0)
Platelets: 167 10*3/uL (ref 150–400)
RDW: 17.2 % — ABNORMAL HIGH (ref 11.5–15.5)

## 2010-05-26 LAB — BASIC METABOLIC PANEL
BUN: 22 mg/dL (ref 6–23)
Calcium: 8.8 mg/dL (ref 8.4–10.5)
Creatinine, Ser: 1.4 mg/dL (ref 0.4–1.5)
GFR calc Af Amer: 60 mL/min — ABNORMAL LOW (ref >60)
GFR calc non Af Amer: 50 mL/min — ABNORMAL LOW (ref >60)

## 2010-05-26 LAB — BRAIN NATRIURETIC PEPTIDE: Pro B Natriuretic peptide (BNP): 289 pg/mL — ABNORMAL HIGH (ref 0.0–100.0)

## 2010-05-27 ENCOUNTER — Encounter: Payer: Medicare Other | Admitting: Internal Medicine

## 2010-05-27 LAB — CBC
HCT: 27.5 % — ABNORMAL LOW (ref 39.0–52.0)
MCHC: 33.5 g/dL (ref 30.0–36.0)
Platelets: 176 10*3/uL (ref 150–400)
RDW: 17.5 % — ABNORMAL HIGH (ref 11.5–15.5)

## 2010-05-27 LAB — BASIC METABOLIC PANEL
Calcium: 8.8 mg/dL (ref 8.4–10.5)
GFR calc non Af Amer: 53 mL/min — ABNORMAL LOW (ref >60)
Glucose, Bld: 118 mg/dL — ABNORMAL HIGH (ref 70–99)
Sodium: 136 mEq/L (ref 135–145)

## 2010-06-01 NOTE — Op Note (Signed)
NAME:  [name], [name]               ACCOUNT NO.:  [id number]  MEDICAL RECORD NO.:  [id number]           PATIENT TYPE:  I  LOCATION:  3313                         FACILITY:  [facility]  PHYSICIAN:  Lyzbeth Genrich A. Thurston Kennedy, M.D. DATE OF BIRTH:  [birth year]  DATE OF PROCEDURE:  05/24/2010 DATE OF DISCHARGE:                              OPERATIVE REPORT   PREOPERATIVE DIAGNOSIS:  Right knee, degenerative joint disease.  POSTOPERATIVE DIAGNOSIS:  Right knee, degenerative joint disease.  PROCEDURE: 1. Right total knee replacement using DePuy cemented total knee system     with #4 cemented femur, #5 cemented tibia with 15-mm polyethylene     RP tibial spacer and 38-mm polyethylene cemented patella. 2. Zinacef impregnated cement.  SURGEON:  Elana Alm. Thurston Hole, MD  ASSISTANT:  Julien Girt, PA  ANESTHESIA:  General.  OPERATIVE TIME:  One hour and 30 minutes.  COMPLICATIONS:  None.  DESCRIPTION OF PROCEDURE:  [name] was brought to the operating room on May 24, 2010, after a femoral nerve block was placed in the holding area by Anesthesia.  He was placed on the operating table in supine position.  He received Ancef 2 g IV preoperatively for prophylaxis.  After being placed under general anesthesia, his right knee was examined.  Range of motion from -8 to 140 degrees, mild varus deformity, knee stable ligamentous exam with normal patellar tracking. He had a Foley catheter placed under sterile conditions.  His right leg was prepped using sterile DuraPrep and draped using sterile technique. After this was done, time-out procedure was called and the correct right knee identified.  The right leg was exsanguinated and a thigh tourniquet elevated to 365 mm.  Initially, through a 15-cm longitudinal incision based over the patella, initial exposure was made.  The underlying subcutaneous tissues were incised along with the skin incision.  A median arthrotomy was performed revealing an  excessive amount of normal- appearing joint fluid.  The articular surfaces were inspected.  He had grade 4 changes medially, grade 3 changes laterally, and grade 3 and 4 changes in the patellofemoral joint.  Osteophytes were removed from the femoral condyles and tibial plateau.  Medial and lateral meniscal remnants were removed as well as the anterior cruciate ligament. Intramedullary drill was then drilled up the femoral canal for placement of distal femoral cutting jig, which was placed in the appropriate manner rotation and a distal 12-mm cut was made.  The distal femur was incised.  A #4 was found to the appropriate size, #4 cutting jig was placed in the appropriate manner of external rotation and then these cuts were made.  The proximal tibia was then exposed.  The tibial spines were removed with an oscillating saw.  Intramedullary drill was then drilled down the tibial canal for placement of proximal tibial cutting jig, which was placed in the appropriate manner rotation and a proximal 2-mm cut was made based off the medial or lower side.  Spacer blocks were then placed in flexion and extension.  A 15-mm blocks gave excellent balancing, excellent stability, and excellent correction of his flexion and varus deformities.  At this point, the #  5 tibial baseplate trial was placed on the cut tibial surface with an excellent fit and the keel cut was made.  The PCL box cutter was then placed on the distal femur and these cuts were made.  After this was done, the #4 femoral trial was placed with a #5 tibial base plate trial and a 15-mm polyethylene RP tibial spacer, knee was reduced, taken through range of motion from 0 to 135 degrees with excellent stability and excellent correction of his flexion and varus deformities and normal patellar tracking.  A resurfacing 9.5-mm cut was then made on the patella and 3 locking holes placed for a 38-mm patellar trial and again patellofemoral tracking  was evaluated and found to be normal.  After this done, it felt that all the trial components were of excellent size, fit, and stability.  They were then removed.  The knee was then jet lavage irrigated with 3 liters of saline.  The proximal tibia was then exposed and #5 tibial baseplate with Zinacef impregnated cement backing was hammered into position with an excellent fit with excess cement being removed from around the edges.  A #4 femoral component with cement backing was hammered into position also with an excellent fit with excess cement being removed from around the edges.  The 15-mm polyethylene RP tibial spacer was placed on tibial baseplate, the knee reduced, taken through range of motion from 0 to 140 degrees with excellent stability and excellent correction of his flexion and varus deformities.  The 38-mm polyethylene cement backed patella was then placed in its position and held there with a clamp.  After cement hardened, again patellofemoral tracking was evaluated and found to be normal.  At this point, it was felt that all the components were of excellent size, fit, and stability.  The wound was further irrigated with saline.  Tourniquet was released.  Hemostasis obtained with cautery.  The arthrotomy was then closed with #1 Ethibond suture over 2 medium Hemovac drains.  Subcutaneous tissues were closed with 0 and 2-0 Vicryl, subcuticular layer was closed with 4-0 Monocryl.  Sterile dressings, long-leg splint applied.  The patient then awakened, extubated, and taken to recovery  room in stable condition.  Needle and sponge counts were correct x2 at the end the case.  Neurovascular status normal.  Pulses 2+ and symmetric.     [name], M.D.     RAW/MEDQ  D:  05/24/2010  T:  05/25/2010  Job:  [id number]  Electronically Signed by Salvatore Marvel M.D. on 06/01/2010 08:36:14 AM

## 2010-06-02 NOTE — Letter (Signed)
Summary: Medical Record Release  Medical Record Release   Imported By: Harlon Flor 05/24/2010 15:20:04  _____________________________________________________________________  External Attachment:    Type:   Image     Comment:   External Document

## 2010-06-02 NOTE — Procedures (Signed)
Summary: Surgical Clearance/AMD   Current Medications (verified): 1)  Omeprazole 20 Mg Cpdr (Omeprazole) .[name].. 1 Tab Daily 2)  Aspirin 81 Mg Chew (Aspirin) .... Two Tablets Once Daily 3)  Diltiazem Hcl Cr 120 Mg Xr12h-Cap (Diltiazem Hcl) .[name].. 1 Once Daily 4)  Prozac 20 Mg Caps (Fluoxetine Hcl) .[name].. 1 Tab Daily 5)  Metoprolol Succinate 50 Mg Xr24h-Tab (Metoprolol Succinate) .... Take One Tablet By Mouth Daily 6)  Pravachol 20 Mg Tabs (Pravastatin Sodium) .[name].. 1 Once Daily 7)  Lisinopril 10 Mg Tabs (Lisinopril) .... One Tablet Once Daily  Allergies (verified): 1)  ! Lipitor   PPM Specifications Following MD:  Lewayne Bunting, MD     PPM Vendor:  Medtronic     PPM Model Number:  [id number]     PPM Serial Number:  [id number] PPM DOI:  05/16/2008     PPM Implanting MD:  NOT IMPLANTED BY [place]  Lead 1    Location: RV     DOI: 05/16/2008     Model #: [id number]     Serial #: [id number]     Status: active  Magnet Response Rate:  BOL 85 ERI  65  Indications:  A-fib   PPM Follow Up Remote Check?  No Battery Voltage:  2.78 V     Battery Est. Longevity:  8 years     Pacer Dependent:  No     Right Ventricle  Amplitude: 22.40 mV, Impedance: 575 ohms, Threshold: 0.75 V at 0.4 msec  Episodes Coumadin:  No Ventricular Pacing:  73%  Parameters Mode:  VVIR     Lower Rate Limit:  60     Upper Rate Limit:  130 Next Cardiology Appt Due:  11/10/2010 Tech Comments:  No parameter changes. Device function normal.  ROV 6 months with Dr.Anushri Casalino in [facility], LPN  May 20, 2010 12:08 PM   ICD Specifications Following MD:  Lewayne Bunting, MD     ICD Vendor:  Medtronic

## 2010-06-08 ENCOUNTER — Ambulatory Visit: Payer: Self-pay | Admitting: Internal Medicine

## 2010-06-08 NOTE — Progress Notes (Signed)
Summary: [name]: Progress Note  [name]: Progress Note   Imported By: Earl Many 06/02/2010 09:38:37  _____________________________________________________________________  External Attachment:    Type:   Image     Comment:   External Document

## 2010-06-10 ENCOUNTER — Encounter: Payer: Medicare Other | Admitting: Internal Medicine

## 2010-06-11 ENCOUNTER — Ambulatory Visit: Payer: Medicare Other | Admitting: Orthopedic Surgery

## 2010-06-15 ENCOUNTER — Ambulatory Visit: Payer: Self-pay | Admitting: Internal Medicine

## 2010-06-17 NOTE — Discharge Summary (Signed)
NAME:  [name], [name]               ACCOUNT NO.:  [id number]  MEDICAL RECORD NO.:  [id number]           PATIENT TYPE:  I  LOCATION:  5038                         FACILITY:  [facility]  PHYSICIAN:  Arnoldo Hildreth A. Thurston Hole, M.D. DATE OF BIRTH:  1933/07/22  DATE OF ADMISSION:  05/24/2010 DATE OF DISCHARGE:  05/27/2010                              DISCHARGE SUMMARY   ADMITTING DIAGNOSES:  End-stage degenerative joint disease, right knee; coronary artery disease status post bypass grafting in 1999; congestive heart failure with an ejection fraction of 45-50%; chronic AFib with pacemaker placement; chronic obstructive pulmonary disease; gastroesophageal reflux disease; hyperlipidemia; hypertension; aortic aneurysm status post repair in 1999; alcoholism; obesity.  DISCHARGE DIAGNOSIS:  End-stage degenerative joint disease, right knee status post right total knee replacement, acute blood loss anemia, acute postop confusion that is secondary to narcotics.  It has cleared since narcotics have been discontinued.  Coronary artery disease status post bypass grafting in 1999, congestive heart failure with an ejection fraction of 45%, chronic AFib status post pacemaker placement, chronic obstructive pulmonary disease, gastroesophageal reflux disease, hyperlipidemia, hypertension, aortic aneurysm status post repair currently stable by ultrasound within the last month.  HISTORY OF PRESENT ILLNESS:  The patient is a 77 year old white male with a history of end-stage DJD of his right knee.  He has tried anti- inflammatories and articular cortisone injections, continues to have right knee pain and has failed conservative care.  Risks, benefits, and possible complications were discussed in detail with the patient and his wife.  He elects to undergo a right total knee replacement.  PROCEDURES IN-HOUSE:  On May 24, 2010, the patient underwent a right total knee replacement by Dr. Thurston Hole, a right femoral nerve  block by anesthesia and a Autovac transfusion of 350 mL.  He tolerated all 3 procedures well.  HOSPITAL COURSE:  The patient was admitted postoperatively for pain control, DVT prophylaxis, and physical therapy.  He was placed on an alcohol withdrawal protocol with Ativan due to him being a known alcoholic. He has showed no signs of DTs, although has had some postop delirium that has cleared.  It appeared worse with narcotics.  All of his narcotics have been discontinued and he is receiving Tylenol for pain control.  He was doing well with Tylenol for pain control and is having significantly less confusion.  At night time, he is given Ativan 0.5 to 1 mg p.o. at bedtime and this has helped with his night time confusion.  In the past 24 hours, he has been alert and oriented to person, place, and situation and has been very cooperative.  Postop day #1, the patient was placed in the step-down unit due to his significant cardiac disease.  He was stable overnight with his pacemaker functioning properly.  Surgical wound was well-approximated.  His hemoglobin was 11 on postop day #1.  He was metabolically stable.  He was up with physical therapy in the cardiac step-down unit and had no difficulty with this and so was transferred to the orthopedic floor. Postop day #2, the patient had some acute onset of delirium following narcotics administration and was very combative,  narcotics were discontinued, he was given some Ativan.  He was more cooperative postop day #2, but somewhat somnolent.  Postop day #3, the patient was alert and oriented to person, place, and situation.  His wheezing improved with albuterol nebulizers.  His BNP is stable at approximately 200. Today he has no shortness of breath, no wheezing, no chest pain.  His hemoglobin is stable at 9.2.  His vital signs were stable.  He has been weaned off oxygen.  Surgical wound is well-approximated.  He has a moderate amount of ecchymosis  on the lateral aspect of his wound.  He is being discharged to [facility] for physical therapy daily, occupational therapy daily for ADLs.  He will need a CPM 0-90 degrees 6 hours a day.  He will need his right heel elevated on a folded pillow for 30 minutes twice a day.  DISCHARGE MEDICATIONS: 1. Lisinopril 10 mg 1 p.o. daily. 2. Pravachol 20 mg 1 p.o. daily. 3. Toprol-XL 50 mg 1 p.o. daily. 4. Omeprazole 20 mg 1 p.o. daily. 5. Fluoxetine 20 mg 1 p.o. daily. 6. Cardizem 120 mg 1 p.o. daily. 7. Aspirin 81 mg 1 p.o. daily. 8. Lovenox 30 mg subcu b.i.d. until June 10, 2010. 9. Tylenol 325 mg 1-2 q.4 h. p.r.n. pain. 10.Albuterol 2.5 mg nebulizer or inhaler q.6 h. p.r.n. wheezing. 11.Colace 100 mg 1 tablet twice a day as needed. 12.Ativan 0.5-1 mg q.8 h. p.r.n. anxiety, alcohol withdrawal, or     agitation.  He is being discharged in stable condition, weightbearing as tolerated, he will need daily dressing changes on his right knee, he will need to follow up with Dr. Wyline Mood on June 07, 2010.  Please call our office with increased pain, increased redness, increased swelling, or a temperature greater than 101.  Our office number is [phone].     Kirstin Shepperson, PA-C   ______________________________ Elana Alm Thurston Hole, M.D.    KS/MEDQ  D:  05/27/2010  T:  05/27/2010  Job:  [id number]  Electronically Signed by Julien Girt P.A. on 06/14/2010 03:35:52 PM Electronically Signed by Salvatore Marvel M.D. on 06/16/2010 02:02:25 PM

## 2010-06-17 NOTE — Cardiovascular Report (Signed)
Summary: Office Visit   Office Visit   Imported By: Roderic Ovens 06/07/2010 16:02:12  _____________________________________________________________________  External Attachment:    Type:   Image     Comment:   External Document

## 2010-06-25 LAB — DIFFERENTIAL
Basophils Absolute: 0 10*3/uL (ref 0.0–0.1)
Basophils Absolute: 0.1 10*3/uL (ref 0.0–0.1)
Basophils Relative: 1 % (ref 0–1)
Eosinophils Absolute: 0.4 10*3/uL (ref 0.0–0.7)
Eosinophils Absolute: 0.5 10*3/uL (ref 0.0–0.7)
Eosinophils Relative: 5 % (ref 0–5)
Lymphocytes Relative: 19 % (ref 12–46)
Neutro Abs: 6.4 10*3/uL (ref 1.7–7.7)
Neutrophils Relative %: 65 % (ref 43–77)
Neutrophils Relative %: 69 % (ref 43–77)

## 2010-06-25 LAB — BASIC METABOLIC PANEL
CO2: 23 mEq/L (ref 19–32)
CO2: 23 mEq/L (ref 19–32)
Calcium: 8.9 mg/dL (ref 8.4–10.5)
Chloride: 109 mEq/L (ref 96–112)
Creatinine, Ser: 1.37 mg/dL (ref 0.4–1.5)
GFR calc Af Amer: 52 mL/min — ABNORMAL LOW (ref >60)
GFR calc Af Amer: >60 mL/min (ref >60)
GFR calc non Af Amer: 51 mL/min — ABNORMAL LOW (ref >60)
Potassium: 4.5 mEq/L (ref 3.5–5.1)
Sodium: 140 mEq/L (ref 135–145)

## 2010-06-25 LAB — CBC
Hemoglobin: 10.1 g/dL — ABNORMAL LOW (ref 13.0–17.0)
MCH: 26.7 pg (ref 26.0–34.0)
MCH: 27 pg (ref 26.0–34.0)
MCHC: 33.2 g/dL (ref 30.0–36.0)
Platelets: 269 10*3/uL (ref 150–400)
Platelets: 276 10*3/uL (ref 150–400)
RBC: 3.81 MIL/uL — ABNORMAL LOW (ref 4.22–5.81)

## 2010-06-25 LAB — MAGNESIUM: Magnesium: 1.8 mg/dL (ref 1.5–2.5)

## 2010-06-25 LAB — URINALYSIS, ROUTINE W REFLEX MICROSCOPIC
Glucose, UA: NEGATIVE mg/dL
Protein, ur: 100 mg/dL — AB
pH: 6 (ref 5.0–8.0)

## 2010-06-25 LAB — CARDIAC PANEL(CRET KIN+CKTOT+MB+TROPI)
CK, MB: 4.6 ng/mL — ABNORMAL HIGH (ref 0.3–4.0)
CK, MB: 5.4 ng/mL — ABNORMAL HIGH (ref 0.3–4.0)
Relative Index: 2.4 (ref 0.0–2.5)
Relative Index: 2.5 (ref 0.0–2.5)
Total CK: 174 U/L (ref 7–232)
Total CK: 211 U/L (ref 7–232)
Total CK: 223 U/L (ref 7–232)
Troponin I: 0.02 ng/mL (ref 0.00–0.06)
Troponin I: 0.11 ng/mL — ABNORMAL HIGH (ref 0.00–0.06)

## 2010-06-25 LAB — POCT CARDIAC MARKERS
CKMB, poc: 3.7 ng/mL (ref 1.0–8.0)
Myoglobin, poc: 221 ng/mL (ref 12–200)

## 2010-06-25 LAB — MRSA PCR SCREENING: MRSA by PCR: NEGATIVE

## 2010-06-25 LAB — CORTISOL-AM, BLOOD: Cortisol - AM: 9.6 ug/dL (ref 4.3–22.4)

## 2010-06-25 LAB — LIPID PANEL
HDL: 36 mg/dL — ABNORMAL LOW (ref >39)
Total CHOL/HDL Ratio: 3.1 RATIO
Triglycerides: 99 mg/dL (ref <150)
VLDL: 20 mg/dL (ref 0–40)

## 2010-06-25 LAB — URINE CULTURE

## 2010-06-25 LAB — URINE MICROSCOPIC-ADD ON

## 2010-06-25 LAB — ETHANOL: Alcohol, Ethyl (B): 5 mg/dL (ref 0–10)

## 2010-07-13 ENCOUNTER — Other Ambulatory Visit (HOSPITAL_COMMUNITY): Payer: MEDICARE

## 2010-07-19 ENCOUNTER — Inpatient Hospital Stay (HOSPITAL_COMMUNITY): Admission: RE | Admit: 2010-07-19 | Payer: Medicare Other | Source: Ambulatory Visit | Admitting: Orthopedic Surgery

## 2010-07-27 LAB — CBC
HCT: 37 % — ABNORMAL LOW (ref 39.0–52.0)
Hemoglobin: 11.5 g/dL — ABNORMAL LOW (ref 13.0–17.0)
Hemoglobin: 12.5 g/dL — ABNORMAL LOW (ref 13.0–17.0)
Platelets: 206 10*3/uL (ref 150–400)
RBC: 3.96 MIL/uL — ABNORMAL LOW (ref 4.22–5.81)
WBC: 7 10*3/uL (ref 4.0–10.5)
WBC: 7.2 10*3/uL (ref 4.0–10.5)

## 2010-07-27 LAB — BASIC METABOLIC PANEL
BUN: 17 mg/dL (ref 6–23)
Calcium: 9.1 mg/dL (ref 8.4–10.5)
GFR calc Af Amer: 57 mL/min — ABNORMAL LOW (ref >60)
GFR calc Af Amer: >60 mL/min (ref >60)
GFR calc non Af Amer: 47 mL/min — ABNORMAL LOW (ref >60)
GFR calc non Af Amer: >60 mL/min (ref >60)
Potassium: 4.2 mEq/L (ref 3.5–5.1)
Sodium: 133 mEq/L — ABNORMAL LOW (ref 135–145)
Sodium: 136 mEq/L (ref 135–145)

## 2010-07-27 LAB — PROTIME-INR: INR: 1.2 (ref 0.00–1.49)

## 2010-07-27 LAB — APTT: aPTT: 33 seconds (ref 24–37)

## 2010-08-17 ENCOUNTER — Telehealth: Payer: Self-pay | Admitting: Emergency Medicine

## 2010-08-17 NOTE — Telephone Encounter (Signed)
Pt called in today stating that when he was in the last time to see you you stated that there is another medication that he could try for his BP that was cheaper. He wanted to know what medication that was and if he can try it. Please advise. Thanks Huntley Dec.

## 2010-08-20 NOTE — Telephone Encounter (Signed)
Pt came in office this AM stating he had not heard back about medication question. He states that Dr. Mariah Milling said a medication might need to be changed if too expensive, he was referring to Metoprolol Succinate. Pt states that he has not had a problem with succ, so pt was advised today to continue with current meds. Pt ok with this.

## 2010-08-24 NOTE — Discharge Summary (Signed)
NAMEJAIMEN, MELONE               ACCOUNT NO.:  [id number]   MEDICAL RECORD NO.:  [id number]          PATIENT TYPE:  INP   LOCATION:  4733                         FACILITY:  [facility]   PHYSICIAN:  Antonieta Iba, MD   DATE OF BIRTH:  Dec 27, 1933   DATE OF ADMISSION:  05/15/2008  DATE OF DISCHARGE:  05/17/2008                               DISCHARGE SUMMARY   DISCHARGE DIAGNOSES:  1. Syncope as well as lightheadedness episodes, and near-syncope      secondary to bradycardia.      a.     The patient had a left eye fracture, requiring surgery with       this syncope.      b.     Placement of permanent transvenous pacemaker, Medtronic,       single chamber.  2. Chronic atrial fibrillation.  3. Anticoagulation.  4. History of coronary disease with bypass grafting in 1999.      a.     Known occluded vein graft to the obtuse marginal.      b.     Previous angioplasty of the mid right coronary artery.  5. History of congestive heart failure.   DISCHARGE CONDITION:  Improved.   PROCEDURE:  Placement of a VVI Medtronic pacemaker on May 16, 2008,  by Dr. Ritta Slot.   DISCHARGE MEDICATIONS:  1. Aspirin 81 mg 2 daily.  2. Omeprazole 20 mg daily.  3. Cartia 120 mg daily.  4. Furosemide if needed.  5. Lisinopril 5 mg daily.  6. Fluoxetine 20 mg daily.  7. Coumadin, do not begin until Sunday, May 18, 2008,  2.5 mg 1      daily as before.  8. Temazepam 15 mg at bedtime.  9. Crestor 5 mg daily.  10.Resume the metoprolol tartrate, i.e., Lopressor 25 mg twice a day,      new dose.  No Toprol for now but Dr. Mariah Milling may change.  11.Tylenol #3 one to two tablets every 6 hours if needed for pain at      pacer site.   DISCHARGE INSTRUCTIONS:  1. Low-sodium heart-healthy diet.  2. Increase activity slowly.  No lifting for 1-2 weeks.  No driving      for 1 week.  3. See pacemaker instruction sheet for complete details for wound      care.  Fever, any swelling, or drainage, call the  office.  4. Follow up with Dr. Mariah Milling in 1 week and [facility] will call      with date and time.  5. Follow up with Dr. Lynnea Ferrier in 3 months for pacemaker interrogation.      The office will call you with date and time.  That will be the      [place] office.  Have Coumadin level checked on Wednesday,      May 21, 2008.   HISTORY OF PRESENT ILLNESS:  A 77 year old gentleman with history of  coronary artery disease, bypass grafting, and graft dysfunction with  occluded vein graft to the OM, angioplasty of the mid RCA, also 1  episode of heart failure  in the past, had a recent syncopal episode,  resulting in fracture of his left orbit.  He underwent surgery and was  seen secondary to previous episodes of near-syncope.   The patient had CardioNet placed and on the CardioNet, he has episodes  of tachycardia with chronic AFib, rates up to 150 beats per minute, also  periods of bradycardia with heart rates in the 30s with frequent pauses,  lasting more than 3 seconds.  He had had several symptomatic episodes.   It was decided to have a pacemaker placed as we could not control the  tachycardia without medication and with the medication, he would have  syncopal episodes.  The patient came into the hospital on May 15, 2008, while weather was significantly bad, he had early morning  procedure, as well as continued 3-second pauses that were symptomatic.   After the patient was admitted, he did have numerous episodes.  He was  originally placed on the telemetry unit due to numerous episodes of 3-  second pauses.  He went to the step-down unit for further closer  management.  On the next morning, he underwent pacemaker insertion by  Dr. Lynnea Ferrier and tolerated the procedure.  Day after procedure, he was  seen by Dr. Lynnea Ferrier.  His pacer was interrogated and with that  interrogation, he did have 1 episode of rapid ventricular response to  his atrial fibrillation.  Blood pressure  was also elevated at the time  of discharge, 160/96 or prior to discharge, but his lisinopril had been  held, unsure of why and as well, we increased his beta-blocker and  restarted his Cardizem.  The patient will follow up with Dr. Lynnea Ferrier as  previously stated.   FAMILY HISTORY, SOCIAL HISTORY, REVIEW OF SYSTEMS:  See H&P.   LABORATORY DATA:  Hemoglobin 12.5, hematocrit 37, WBC 7.2, platelets  206.  Basic metabolic panel, sodium 133, potassium 4.2, chloride 100,  CO2 24, glucose 104, BUN 17, creatinine 1.17 and PTT prior to procedure  33 with a pro time of 1.16 with an INR of 1.2 and a CBC with hemoglobin  11.5, hematocrit 34.6 prior to the procedure.   Post procedure, the patient had several needs for fentanyl IV but he  will be discharged with Tylenol No. 3.      Darcella Gasman. Annie Paras, N.P.      Antonieta Iba, MD  Electronically Signed    LRI/MEDQ  D:  05/17/2008  T:  05/17/2008  Job:  [id number]   cc:   Ritta Slot, MD  Cay Schillings

## 2010-08-24 NOTE — H&P (Signed)
NAMECRYSTIAN, [name]               ACCOUNT NO.:  [id number]   MEDICAL RECORD NO.:  [id number]         PATIENT TYPE:  INP   LOCATION:                               FACILITY:  [facility]   PHYSICIAN:  Antonieta Iba, MD   DATE OF BIRTH:  1933-09-09   DATE OF ADMISSION:  05/15/2008  DATE OF DISCHARGE:                              HISTORY & PHYSICAL   HISTORY:  [name] is a 77 year old gentleman with a history of  coronary artery disease, history of bypass surgery with recent syncopal  event resulting in a fracture of his left orbit, status post repair,  also with previous episodes of near syncope, who presents for routine  followup today in the clinic on May 15, 2008.  He has had a  CardioNet over the past several weeks following his admission to  [facility] on April 23, 2008 for his syncopal  episode.  CardioNet shows frequent periods of tachycardia with chronic  atrial fibrillation with rates up to 150 beats per minute and also with  periods of bradycardia with rates in the 30s and frequent pauses lasting  more than 3 seconds.  He has had several symptomatic episodes and hit  his button several times.  His most recent syncopal episode in January  was when he hopped up in the middle of the night to go to the bathroom  and he was found on the floor, and that is when he went to [facility].  Given  the findings on his CardioNet, he is being admitted to the hospital for  pacemaker placement.  The case has been discussed with Dr. Lynnea Ferrier.  Due  to the poor weather due to come into the [facility] area tomorrow,  Friday, May 16, 2008, I suggested that they go to the hospital late  this afternoon for the procedure early tomorrow.  It was discussed that  we could potentially wait and have the patient report next week, though  given that he has numerous prolonged pauses and has had a recent  syncopal episode with the eye fracture from his fall, I did not want  to  wait too long and felt that he might get lost to followup.   PAST MEDICAL HISTORY:  1. Significant for coronary disease, history of bypass surgery in 1999      with an occluded vein graft to the OM, PTCA of the mid RCA, history      of CHF symptoms in March 2009 with admission to [facility].  2. Recent syncopal episode with fracture of the left eye requiring      surgery.  3. Previous episodes of lightheadedness and near syncope.   CURRENT MEDICATIONS:  1. Omeprazole 20 mg daily.  2. Aspirin 162 mg daily.  3. Warfarin 2.5 mg daily.  4. Cartia 120 mg daily.  5. Lasix p.r.n.  6. Lisinopril 5 mg daily.  7. Fluoxetine 20 mg daily.  8. Temazepam 15 mg q.h.s.  9. Crestor 5 mg daily.  10.Metoprolol tartrate 12.5 mg q.a.m. only.  (This had been  tried over      the past week in an effort to slow his heart rate.)   FAMILY HISTORY:  Noncontributory.   SOCIAL HISTORY:  He is married with 3 children and 3 grandchildren.  Does not do any exercise.  He leads a sedentary lifestyle.  No  significant smoking history.  He does not drink any significant alcohol.  He is retired.   PHYSICAL EXAMINATION:  VITAL SIGNS:  His blood pressure typically runs  between 100-130 systolic/60-80s with heart rate in the 60-70s with  details as above, weight 235 pounds, he is 6 feet 1 inch.  HEENT:  Benign.  NECK:  Supple with no JVP or carotid bruits.  HEART:  Sounds are irregular with S1 and S2.  No murmurs appreciated.  LUNGS:  Clear to auscultation with no wheezes or rales.  He is  moderately obese with no widening of the aortic pulsation.  EXTREMITIES:  He has equal and symmetrical pulses in his upper and lower  extremities.  No significant lower extremity edema.  NEUROLOGIC:  Nonfocal.  SKIN:  Warm and dry.   LABORATORY DATA:  Most recent lab findings show an INR from May 14, 2008 of 1.2, creatinine 1.54, BUN 28 which is up from his creatinine of  1.29 and BUN of  19 in October 2009.  This is most likely due to his  p.r.n. Lasix.   DIAGNOSTICS:  EKG shows atrial fibrillation with rates in the 60-70s  with no significant ST/T wave changes.   SUMMARY:  [name] is a 77 year old gentleman with history of coronary  disease and bypass in 1999, history of congestive heart failure, recent  history of near syncope as well as a severe syncopal episode with a left  eye fracture requiring surgery.  Recent CardioNet showing sick sinus  syndrome with rates up to 150 and as low as 35 with frequent prolonged  pauses more than 3 seconds long.  Due to the challenging nature of his  rhythm and the inability to control his tachycardia with low-dose beta  blockers due to his bradycardia and pauses and due to his recent  syncopal episode due to his pauses and bradycardia, I have suggested  that he have a pacemaker placed to make management easier of his rate.  I have discussed this with Dr. Lynnea Ferrier and he is tentatively scheduled  for a pacemaker placement early in the morning on May 16, 2008 at  [facility].  He will be admitted this evening due to severe  weather that is due to hit the area the day of the procedure.      Antonieta Iba, MD  Electronically Signed     TJG/MEDQ  D:  05/15/2008  T:  05/15/2008  Job:  [id number]   cc:   Cay Schillings

## 2010-08-27 NOTE — Discharge Summary (Signed)
NAMEAARYN, [name]               ACCOUNT NO.:  [id number]   MEDICAL RECORD NO.:  [id number]          PATIENT TYPE:  INP   LOCATION:  6526                         FACILITY:  [facility]   PHYSICIAN:  Darlin Priestly, MD  DATE OF BIRTH:  12/21/1933   DATE OF ADMISSION:  12/14/2005  DATE OF DISCHARGE:  12/16/2005                               DISCHARGE SUMMARY   PRIMARY CARDIOLOGIST:  Dr. Elsie Lincoln.   PRIMARY CARE PHYSICIAN:  Dr. Cay Schillings.   FINAL DIAGNOSES:  1. Chest pain/unstable angina pectoris.  2. Status post stenting of the proximal PLA with a 2.5 x 8 mm CYPHER      stent taking 8% stenosis to the left and 10% residual.  3. Status post stenting of the mid right coronary artery with a 3.5 x      18 mm CYPHER stent taking 70% stenosis to the left and 10%      residual.  4. Sixty percent stenosis in the circumflex artery.  5. Left ventricular function ejection fraction 40% with global      hypokinesis.  6. Status post coronary artery bypass graft in 1999.  7. Hypertension.  8. Dyslipidemia.  9. Obesity.  10.Gastroesophageal reflux disease.  11.Degenerative disc disease.  12.Abdominal aortic aneurysm repair in 1999 with a patent graft on      cardiac catheterization.  13.Eighty percent left renal artery stenosis.  14.Totally occluded saphenous vein graft to the obtuse marginal      branch.   BRIEF HISTORY:  [name] is a 77 year old male with history of  coronary artery disease, status post CABG x2 in 1999.  He had done quite  well since that time with only occasional chest pain.  Prior to his  admission, he experienced left anterior chest discomfort and shoulder  pain, which were accompanied by shortness of breath and nausea.  He had  negative enzymes at [facility] where he sought  initial attention; however, upon arrival to [facility] transfer, his CK  was 196, CK-MB was 5.9 with an index of 3.0 and troponin was 0.45.  He  was scheduled for cardiac  catheterization on December 15, 2005 with the  above findings.  There was also a patent left internal mammary artery to  the LAD with diffuse apical left anterior descending.  There was a  totally occluded vein graft to the obtuse marginal branch and there was  significant 2-vessel coronary artery disease.  There was also evidence  of 80% left renal artery stenosis.  Percutaneous coronary intervention,  he was started on an Integrilin infusion and transferred to telemetry in  stable condition.  On the day of discharge, he is ambulating in the  hallway without difficulty and has had no recurrent chest discomfort.   DISCHARGE INSTRUCTIONS:  He is to follow a low-fat, low-cholesterol, low-  sodium diet.  He is to avoid driving for the next 2 days and no heavy  lifting for the next 2 weeks.  He is to increase his activity slowly and  he may shower within 24 hours.   WOUND CARE:  He is to contact us with any redness, swelling or drainage  from the right groin site or particularly with the onset of any new  chest pain or shortness of breath.   DISCHARGE MEDICATIONS:  1. Aspirin 325 mg daily.  2. Zetia 10 mg daily.  3. Toprol-XL 25 mg 1/2 tablet daily.  4. Protonix 40 mg daily.  5. Plavix 75 mg daily.  6. Lisinopril 20 mg daily.   FOLLOWUP APPOINTMENT:  He is to see Dr. Jenne Campus on September 24 at  12:15.     ______________________________  Charmian Muff, NP      Darlin Priestly, MD  Electronically Signed    LS/MEDQ  D:  04/24/2006  T:  04/25/2006  Job:  [id number]   cc:   Cay Schillings

## 2010-08-27 NOTE — Cardiovascular Report (Signed)
[name], [name]               ACCOUNT NO.:  [id number]   MEDICAL RECORD NO.:  [id number]          PATIENT TYPE:  INP   LOCATION:  3799                         FACILITY:  [facility]   PHYSICIAN:  Darlin Priestly, MD  DATE OF BIRTH:  [birth year]   DATE OF PROCEDURE:  12/15/2005  DATE OF DISCHARGE:                              CARDIAC CATHETERIZATION   PROCEDURE:  1. Left heart catheterization.  2. Coronary angiography.  3. Left ventriculogram.  4. Saphenous vein graft.  5. Left internal mammary artery angiography.  6. Abdominal aortogram.  7. PLA , proximal, placement endocoronary stent.  8. Right coronary artery mid, placement endocoronary stent.  .   COMPLICATIONS:  None.   INDICATIONS:  [name] is a 77 year old male patient of Dr. Lavonne Chick,  Dr. Sheppard Penton  and Dr. Cheree Ditto with a history of hypertension, hyperlipidemia,  history of peripheral vascular disease, status post abdominal aorta repair  by Dr. Gretta Began in 1999, history of CAD, status post bypass surgery in  1999 consisting of LIMA to the LAD and vein graft to obtuse marginal. He was  recently admitted to [facility] with a complaint of  substernal chest pain and did rule in for non-Q wave MI with a troponin of  1.1.  He is now referred for cardiac catheterization to reassess his CAD.   DESCRIPTION OF PROCEDURE:  After giving informed consent, the patient was  brought to the cardiac catheterization laboratory.  Right lower quadrant was  shaved, prepped and draped in the usual sterile fashion.  ECG monitor was  established.  Using the modified Seldinger technique, #6 intra-arterial  sheath was inserted in the right femoral artery.  A 6-French diagnostic  catheter then performed diagnostic angiography.   The left main is a medium size vessel with no significant disease.   The LAD is a small vessel which was totally occluded in its proximal  portion.  The mid and distal LAD fill the patent LIMA which  insertion to the  portion of the LAD.  The LAD becomes a small, diffusely diseased vessel at  the apex.  There is retrograde fill at one small diagonal branch.   The left coronary artery also has a strong medium size ramus intermedius  which bifurcates distally with no significant disease.   The left circumflex is a medium size vessel that coarse to the AV groove to  one obtuse marginal branch.  AV circumflex is noted to have diffuse 60% mid  vessel disease.   The first OM is a medium size vessel which bifurcates in the mid segment.  There is a graft which previously inserted into the lower branch of the OM  which does retrograde fill but it appears to be totally occluded with dye  hanging in the graft.  There is a 50% lesion beyond the graft insertion in  the OM.   Saphenous vein graft to the OM is totally occluded in its early mid segment.  There is TIMI 0 to 1 flow into the mid portion of the graft.  As previously  stated, the distal limb of  the graft does retrograde fill from the native  OM.   The right coronary artery is a large vessel, is dominant coarse to the PDA  and posterolateral branch.  The RCA is a large vessel with 60 and 70%  sequential lesions its distal mid segment.  There is 50% lesion in the  distal RCA prior to the bifurcation.   The PDA is a medium size vessel with no significant disease.   The PLA is a large vessel which bifurcates in its distal side,  more than  80% proximal lesion.   HEMODYNAMIC SYSTEM:  Arterial pressure 125/72, LV systemic pressure 124/7,  LVEDP 18.   LEFT VENTRICULOGRAM:  Mildly depressed EF of 40% with global hypokinesis.   ABDOMINAL AORTOGRAM:  Patent abdominal aorta graft extending into both  proximal iliacs.  There is an 80% ostial left renal artery stenosis noted.   INTERVENTIONAL PROCEDURE:  PLA, left proximal:  Final diagnostic  angiography, a #6-French JR-4 guiding catheter with side holes was engaged  in right coronary  ostium.  Next, a 0.014 Reuben Likes was advanced out of the  guiding catheter and positioned in the distal PLA without difficulty.  Next,  a Cypher 2.5 x 8 mm stent was then positioned across the proximal PLA lesion  and deployed to 8 atmospheres for 27 seconds.  A second inflation to 12  atmospheres was performed for a total of 17 seconds.  Followup angiogram  revealed excellent luminal gain with no evidence of dissection of thrombus.  This balloon was removed and a Powersail 2.5 x 8 was then placed within the  previously placed stent.  One inflation of 14 atmospheres was performed for  a total of 20 seconds.  Followup angiogram revealed no evidence of  dissection or thrombus.  TIMI 3 through the distal vessel.  This balloon was  then removed and a Cypher 3.5 x 18 mm stent was then placed in the distal  mid RCA.  This stent was employed at 12 atmospheres for a total of 25  seconds.  A second inflation to 16 atmospheres was performed for 17 seconds.  Followup angiogram revealed no evidence of dissection or thrombus.  This  balloon was then exchanged for a Powersail 3.75 x 13 mm balloon.  Two  inflations to a maximum of 14 atmospheres was performed for a total of  approximately 40 seconds.  Followup angiogram revealed no evidence of  dissection or thrombus, and TIMI 3 flow through the distal vessel.  IV  Integrilin was used throughout the case.  Intravenous doses of heparin were  given to maintain AC between 200 and 300.   Final angiograms reveal less than 10% residual stenosis in the PLA stenotic  lesions as well as less than 10% residual stenosis in the mid RCA stenotic  lesion.  At this point we elected to conclude the procedure.  All balloons,  sheaths, and  catheters were removed.  Hemostatic sheaths were sewn in  place.  The patient transferred back to the ward in stable condition.   CONCLUSIONS: 1. Successful placement of a Cypher 2.5 x 8 mm stent in the proximal      posterolateral  artery lesion.  2. Successful placement of a Cypher 3.5 x 18 mm stent in the mid right      coronary artery stenotic lesion, ultimately post dilated to 3.85 mm.  3. Patent left internal mammary artery to the left anterior descending      with diffuse apical left anterior descending.  4. Totally occluded vein graft to the obtuse marginal.  5. Significant two-vessel coronary artery disease.  6. Mild depressed left ventricular systolic function.  7. Patent abdominal aortic graft.  8. Eighty percent left renal artery stenosis.  9. Adjuvant use of Integrilin infusion.      Darlin Priestly, MD  Electronically Signed     RHM/MEDQ  D:  12/15/2005  T:  12/15/2005  Job:  [id number]   cc:   Madaline Savage, M.D.  Cay Schillings

## 2010-08-27 NOTE — Op Note (Signed)
NAME:  [name], [name]                         ACCOUNT NO.:  [id number]   MEDICAL RECORD NO.:  [id number]                   PATIENT TYPE:  INP   LOCATION:  2860                                 FACILITY:  [facility]   PHYSICIAN:  Hewitt Shorts, M.D.            DATE OF BIRTH:  Jun 20, 1933   DATE OF PROCEDURE:  01/01/2003  DATE OF DISCHARGE:                                 OPERATIVE REPORT   PREOPERATIVE DIAGNOSIS:  Cervical spondylosis, degenerative disk disease,  and radiculopathy.   POSTOPERATIVE DIAGNOSIS:  Cervical spondylosis, degenerative disk disease,  and radiculopathy.   OPERATION PERFORMED:  C4-5 and C5-6 anterior cervical diskectomy and  arthrodesis with iliac crest allograft and tether cervical plating.   SURGEON:  Hewitt Shorts, M.D.   ASSISTANT:  Payton Doughty, M.D.   ANESTHESIA:  General endotracheal.   INDICATIONS FOR PROCEDURE:  The patient is a 77 year old man who presented  with proximal right upper extremity weakness and atrophy.  He has undergone  extensive work-up including evaluation of myopathy and amyotrophic lateral  sclerosis.  In the end it was the opinion of his neurologist that in fact,  his difficulties are due to cervical spondylosis and therefore, the patient  is admitted now for surgical decompression.   DESCRIPTION OF PROCEDURE:  The patient was brought to the operating room and  placed under general endotracheal anesthesia.  The patient was placed in 10  pounds of halter traction.  The neck was prepped with Betadine soap and  solution and draped in sterile fashion.  A horizontal incision was made on  the left side of the neck.  The line of incision was infiltrated with local  anesthetic with epinephrine.  Dissection was carried down to the  subcutaneous tissue and platysma and bipolar cautery was used to maintain  hemostasis.  Dissection was carried out to an avascular plane leaving the  sternocleidomastoid, carotid artery and jugular  vein laterally and trachea  and esophagus medially.  The ventral aspect of the vertebral column was  identified.  An x-ray was taken and the C3-4 and C4-5 intervertebral disk  spaces were identified.  Diskectomy was begun at the C4-5 level.  Incision  of the annulus continued with microcurets and pituitary rongeurs.  There was  marked degeneration of the disk space which was markedly narrowed.  The  cartilaginous end plates and the corresponding vertebrae were removed using  the MicroMax drill and then the operating microscope was draped and brought  into the field to provide additional magnification, illumination and  visualization and the remainder of the diskectomy and decompression was  performed using microdissection and microsurgical technique.  Posterior  osteophytic overgrowth was removed using the MicroMax drill along with a 2mm  Kerrison punch with a thin foot plate.  The posterior longitudinal ligament  which was partially calcified, was carefully removed and then a foraminotomy  was performed bilaterally.  In the end,  good decompression of the spinal  canal and thecal sac as well as the foramina and nerve roots was achieved  bilaterally.  At the C5-6 level, it was quite difficult to identify the  actual interspace.  The anterior longitudinal ligament had ossified and we  could not identify the disk space or the annulus.  X-rays were taken.  The  view was limited due to his shoulders.  We attempted to visualize it with a  C-arm and in the end, through relative dead reckoning, we identified where  we believe the disk space would be and drilled off the anterior ossification  and entered into the disk space.  We scraped out the remaining degenerated  disk material with microcurets and the cartilaginous end plates which were  partially calcified, were removed as well, using the MicroMax drill as well  as microcurets.  The microscope again was brought into the field to provide   additional magnification, illumination and visualization and the remainder  of the decompression was performed using microdissection and microsurgical  technique. There was significant posterior osteophytic overgrowth that was  removed the MicroMax drill along with the 2 mm Kerrison punch with thin foot  plate.  We were able to decompress the spinal canal and thecal sac and then  performed foraminotomies decompressing the foramina and nerve roots  bilaterally.  We examined the foramina carefully at each level and felt that  good decompression of the nerve roots had been achieved and we had also  decompressed the spinal canal at both the C4-5 and C5-6 levels.  Once the  diskectomy was completed, we measured the disk spaces using spacers to  select the size of interbody graft. We selected a 7 mm graft at C4-5 and an  8 mm graft at C5-6.  Each of the grafts was hydrated in saline solution and  positioned in the intervertebral disk space and countersunk.  Gelfoam was  used to help establish hemostasis.  We selected a 35 mm tether cervical  plate.  It was positioned over the fusion construct and secured to the C4  and C6 vertebrae with a pair of 4 x 14 mm screws and to the C5 vertebra with  a single 4 x 14 mm screw.  Each screw hole was drilled and tapped. The  screws were placed in alternating fashion. All five screws were placed and  then they were fully tightened.  The wound was irrigated with bacitracin  solution and checked for hemostasis which was established and confirmed.  An  x-ray was taken which showed the screws in good position at C4 and the graft  in good position a the C4-5 level but we could not see the C5 or 6 vertebrae  or the C5-6 disk space due to the patient's shoulders.  However, under  direct visualization, the grafts appeared in good position, the plate and  screws appeared in good position.  The wound was irrigated with bacitracin solution and checked for hemostasis once  again and then we proceeded with  closure.  The platysma was closed with interrupted inverted 2-0 undyed  Vicryl sutures, the subcutaneous and subcuticular layer were closed with  interrupted inverted 3-0 undyed Vicryl sutures.  The skin was reapproximated  with Dermabond.  The patient tolerated the procedure well.  The estimated  blood loss was 50mL.  Sponge and instrument counts were correct.  Following  surgery, the patient had been taken out of cervical traction once the bone  grafts were in place,  was reversed from anesthetic to be extubated and was  transferred to the recovery room for further care.                                                Hewitt Shorts, M.D.    RWN/MEDQ  D:  01/01/2003  T:  01/02/2003  Job:  [id number]

## 2010-09-17 ENCOUNTER — Observation Stay: Payer: Medicare Other | Admitting: Internal Medicine

## 2010-09-23 ENCOUNTER — Encounter: Payer: Self-pay | Admitting: Cardiovascular Disease

## 2010-09-23 ENCOUNTER — Ambulatory Visit (INDEPENDENT_AMBULATORY_CARE_PROVIDER_SITE_OTHER): Payer: Medicare Other | Admitting: Cardiovascular Disease

## 2010-09-23 DIAGNOSIS — Z950 Presence of cardiac pacemaker: Secondary | ICD-10-CM

## 2010-09-23 DIAGNOSIS — R079 Chest pain, unspecified: Secondary | ICD-10-CM | POA: Insufficient documentation

## 2010-09-23 DIAGNOSIS — I509 Heart failure, unspecified: Secondary | ICD-10-CM

## 2010-09-23 DIAGNOSIS — E785 Hyperlipidemia, unspecified: Secondary | ICD-10-CM

## 2010-09-23 DIAGNOSIS — J449 Chronic obstructive pulmonary disease, unspecified: Secondary | ICD-10-CM

## 2010-09-23 DIAGNOSIS — I10 Essential (primary) hypertension: Secondary | ICD-10-CM

## 2010-09-23 DIAGNOSIS — I25810 Atherosclerosis of coronary artery bypass graft(s) without angina pectoris: Secondary | ICD-10-CM

## 2010-09-23 DIAGNOSIS — I714 Abdominal aortic aneurysm, without rupture: Secondary | ICD-10-CM

## 2010-09-23 DIAGNOSIS — I4891 Unspecified atrial fibrillation: Secondary | ICD-10-CM

## 2010-09-23 NOTE — Assessment & Plan Note (Signed)
Blood pressure is well controlled on today's visit. No changes made to the medications. 

## 2010-09-23 NOTE — Assessment & Plan Note (Signed)
Recent chest pain from sustaining a fall and injury to the left side of his chest. He has point tenderness. Recent admission to the hospital overnight for pain control. Continues to have mild pain with movement.

## 2010-09-23 NOTE — Assessment & Plan Note (Signed)
Cholesterol is at goal on the current lipid regimen. No changes to the medications were made.  

## 2010-09-23 NOTE — Progress Notes (Signed)
   Patient ID: [name], male    DOB: 1934-03-14, 77 y.o.   MRN: [id number]  HPI Comments: 77 year old gentleman with a history of coronary artery disease, bypass surgery in 1999 with catheterization in 2007 with patent LIMA to the LAD, occluded vein graft to the OM, history of Cypher stent to the PL branch and mid RCA in September 07, ejection fraction 45-50%, chronic atrial fibrillation with Medtronic pacemaker not on Coumadin due to recurrent falls and injury from alcohol abuse. Pacemaker was placed by Dr. Lynnea Ferrier on 25 2010 for syncope, AFib with pauses.   [name] Recently fell over a garden hose and injured his chest. He was admitted to the hospital overnight for pain control. He was discharged on any nonsteroidal anti-inflammatory medication. He reports that his discomfort is slowly getting better. He has point tenderness over the left chest radiating into his left upper back.   He is status post right knee replacement. Uncertain if he is still drinking as he has had a problem with alcohol abuse in the past and has even been to rehabilitation with relapse. Uncertain if his recent fall had anything to do with alcohol. This was not addressed with him.   EKG shows paced rhythm at 67 beats per minute      Review of Systems  Constitutional: Negative.   HENT: Negative.   Eyes: Negative.   Respiratory: Negative.   Cardiovascular: Positive for chest pain.  Gastrointestinal: Negative.   Musculoskeletal: Positive for gait problem.  Skin: Negative.   Neurological: Negative.   Hematological: Negative.   Psychiatric/Behavioral: Negative.   All other systems reviewed and are negative.    BP 126/71  Pulse 67  Ht 6\' 1"  (1.854 m)  Wt 230 lb (104.327 kg)  BMI 30.34 kg/m2   Physical Exam  Nursing note and vitals reviewed. Constitutional: He is oriented to person, place, and time. He appears well-developed and well-nourished.       Unsteady gait, walks with a cane  HENT:  Head:  Normocephalic.  Nose: Nose normal.  Mouth/Throat: Oropharynx is clear and moist.  Eyes: Conjunctivae are normal. Pupils are equal, round, and reactive to light.  Neck: Normal range of motion. Neck supple. No JVD present.  Cardiovascular: Normal rate, regular rhythm, S1 normal, S2 normal, normal heart sounds and intact distal pulses.  Exam reveals no gallop and no friction rub.   No murmur heard.      Some chest discomfort with palpation on the left  Pulmonary/Chest: Effort normal and breath sounds normal. No respiratory distress. He has no wheezes. He has no rales. He exhibits no tenderness.  Abdominal: Soft. Bowel sounds are normal. He exhibits no distension. There is no tenderness.  Musculoskeletal: Normal range of motion. He exhibits no edema and no tenderness.  Lymphadenopathy:    He has no cervical adenopathy.  Neurological: He is alert and oriented to person, place, and time. Coordination normal.  Skin: Skin is warm and dry. No rash noted. No erythema.  Psychiatric: He has a normal mood and affect. His behavior is normal. Judgment and thought content normal.           Assessment and Plan

## 2010-09-23 NOTE — Patient Instructions (Signed)
You are doing well. No medication changes were made. Please call us if you have new issues that need to be addressed before your next appt.  We will call you for a follow up Appt. In 6 months  

## 2010-09-23 NOTE — Assessment & Plan Note (Signed)
Rate is well controlled on his current medication regimen. No changes made

## 2010-12-10 ENCOUNTER — Encounter: Payer: Self-pay | Admitting: *Deleted

## 2010-12-22 ENCOUNTER — Encounter: Payer: Self-pay | Admitting: Cardiovascular Disease

## 2011-03-17 ENCOUNTER — Encounter: Payer: Self-pay | Admitting: Cardiovascular Disease

## 2011-03-25 ENCOUNTER — Ambulatory Visit (INDEPENDENT_AMBULATORY_CARE_PROVIDER_SITE_OTHER): Payer: Medicare Other | Admitting: Cardiovascular Disease

## 2011-03-25 ENCOUNTER — Encounter: Payer: Self-pay | Admitting: Cardiovascular Disease

## 2011-03-25 DIAGNOSIS — I25810 Atherosclerosis of coronary artery bypass graft(s) without angina pectoris: Secondary | ICD-10-CM

## 2011-03-25 DIAGNOSIS — E785 Hyperlipidemia, unspecified: Secondary | ICD-10-CM

## 2011-03-25 DIAGNOSIS — M79603 Pain in arm, unspecified: Secondary | ICD-10-CM

## 2011-03-25 DIAGNOSIS — Z950 Presence of cardiac pacemaker: Secondary | ICD-10-CM

## 2011-03-25 DIAGNOSIS — I10 Essential (primary) hypertension: Secondary | ICD-10-CM

## 2011-03-25 DIAGNOSIS — M79609 Pain in unspecified limb: Secondary | ICD-10-CM

## 2011-03-25 DIAGNOSIS — I4891 Unspecified atrial fibrillation: Secondary | ICD-10-CM

## 2011-03-25 DIAGNOSIS — R079 Chest pain, unspecified: Secondary | ICD-10-CM

## 2011-03-25 MED ORDER — DABIGATRAN ETEXILATE MESYLATE 150 MG PO CAPS: 150.0000 mg | ORAL_CAPSULE | Freq: Two times a day (BID) | ORAL | Status: DC

## 2011-03-25 NOTE — Progress Notes (Signed)
Patient ID: [name], male    DOB: 06-May-1933, 77 y.o.   MRN: [id number]  HPI Comments: 77 year old gentleman with a history of coronary artery disease, bypass surgery in 1999 with catheterization in 2007 with patent LIMA to the LAD, occluded vein graft to the OM, history of Cypher stent to the PL branch and mid RCA in September 07, ejection fraction 45-50%, chronic atrial fibrillation with Medtronic pacemaker not on Coumadin due to previous recurrent falls and injury from alcohol abuse. Pacemaker was placed by Dr. Lynnea Ferrier on 25 2010 for syncope, AFib with pauses.   Mr. Kruse has not been drinking alcohol by report and has had less falls. He overall feels well with no complaints. His wife reports that he sleeps all day and is up all night and this bothers her. She would like him to sleep at night and be more active in the daytime. Notes indicate he had a discussion with Dr. Sheppard Penton about warfarin. He was reluctant to start anticoagulation secondary to the inconvenience of trips to the doctor.   He is status post right knee replacement.  EKG shows paced rhythm at 67 beats per minute    Outpatient Encounter Prescriptions as of 03/25/2011  Medication Sig Dispense Refill  . aspirin 81 MG EC tablet Take 162 mg by mouth daily.       [name] diltiazem (DILACOR XR) 180 MG 24 hr capsule Take 180 mg by mouth daily.        . ferrous sulfate 325 (65 FE) MG tablet Take 325 mg by mouth daily with breakfast.        . FLUoxetine (PROZAC) 20 MG capsule Take 20 mg by mouth daily.        [name] lisinopril (PRINIVIL,ZESTRIL) 10 MG tablet Take 10 mg by mouth daily.        . metoprolol (TOPROL-XL) 50 MG 24 hr tablet Take 50 mg by mouth daily.        . OMEGA 3 1200 MG CAPS Take 2 capsules by mouth every morning.        [name] omeprazole (PRILOSEC) 20 MG capsule Take 20 mg by mouth daily.        . pravastatin (PRAVACHOL) 20 MG tablet Take 20 mg by mouth daily.        . dabigatran (PRADAXA) 150 MG CAPS Take 1 capsule (150 mg  total) by mouth every 12 (twelve) hours.  60 capsule  6     Review of Systems  Constitutional: Negative.   HENT: Negative.   Eyes: Negative.   Respiratory: Negative.   Gastrointestinal: Negative.   Skin: Negative.   Neurological: Negative.   Hematological: Negative.   Psychiatric/Behavioral: Negative.   All other systems reviewed and are negative.    BP 140/100  Pulse 67  Ht 6\' 1"  (1.854 m)  Wt 237 lb (107.502 kg)  BMI 31.27 kg/m2   Physical Exam  Nursing note and vitals reviewed. Constitutional: He is oriented to person, place, and time. He appears well-developed and well-nourished.       Unsteady gait, walks with a cane  HENT:  Head: Normocephalic.  Nose: Nose normal.  Mouth/Throat: Oropharynx is clear and moist.  Eyes: Conjunctivae are normal. Pupils are equal, round, and reactive to light.  Neck: Normal range of motion. Neck supple. No JVD present.  Cardiovascular: Normal rate, regular rhythm, S1 normal, S2 normal, normal heart sounds and intact distal pulses.  Exam reveals no gallop and no friction rub.   No murmur  heard.      Some chest discomfort with palpation on the left  Pulmonary/Chest: Effort normal and breath sounds normal. No respiratory distress. He has no wheezes. He has no rales. He exhibits no tenderness.  Abdominal: Soft. Bowel sounds are normal. He exhibits no distension. There is no tenderness.  Musculoskeletal: Normal range of motion. He exhibits no edema and no tenderness.  Lymphadenopathy:    He has no cervical adenopathy.  Neurological: He is alert and oriented to person, place, and time. Coordination normal.  Skin: Skin is warm and dry. No rash noted. No erythema.  Psychiatric: He has a normal mood and affect. His behavior is normal. Judgment and thought content normal.           Assessment and Plan

## 2011-03-25 NOTE — Assessment & Plan Note (Signed)
In the past, he has not tolerated higher doses of cholesterol medication.

## 2011-03-25 NOTE — Assessment & Plan Note (Signed)
We have discussed anticoagulation. With [name] we have suggested he try Pradaxa 150 mg b.i.d..  We have given him some samples, called in a prescription. On his insurance, this is a teir one medication and should not be expensive.

## 2011-03-25 NOTE — Assessment & Plan Note (Signed)
He is overdue for a pacemaker check. We'll schedule this for him with Dr. Ladona Ridgel.

## 2011-03-25 NOTE — Assessment & Plan Note (Signed)
Blood pressure is elevated on today's visit. We've asked him to monitor his blood pressure at home.

## 2011-03-25 NOTE — Assessment & Plan Note (Signed)
Currently with no symptoms of angina. No further workup at this time. Continue current medication regimen. 

## 2011-03-25 NOTE — Patient Instructions (Addendum)
You are doing well.  We will start pradaxa 150 mg twice a day (blood thinner for atrial fibrillation) We will schedule you for an appt with Dr. Ladona Ridgel to have your pacemaker checked.  Please call us if you have new issues that need to be addressed before your next appt.  The office will contact you for a follow up Appt. In 6 months

## 2011-04-01 ENCOUNTER — Telehealth: Payer: Self-pay

## 2011-04-01 NOTE — Telephone Encounter (Signed)
Spoke to Charmayne Sheer @ [phone] for prior authorization on Pradaxa 150 mg one tablet bid.  The authorization is approved starting Jan. 1, 2013 through Jan.1, 2014.  Ref number is [id number]. Called [facility] to let them know pradaxa approved for one year.  The patient was given samples to get him through the holidays.

## 2011-04-13 LAB — CBC
HCT: 40.6 % (ref 40.0–52.0)
HGB: 13.5 g/dL (ref 13.0–18.0)
MCHC: 33.2 g/dL (ref 32.0–36.0)
MCV: 94 fL (ref 80–100)

## 2011-04-13 LAB — BASIC METABOLIC PANEL
Anion Gap: 12 (ref 7–16)
BUN: 34 mg/dL — ABNORMAL HIGH (ref 7–18)
Chloride: 106 mmol/L (ref 98–107)
Co2: 20 mmol/L — ABNORMAL LOW (ref 21–32)
EGFR (African American): 48 — ABNORMAL LOW
Osmolality: 283 (ref 275–301)

## 2011-04-13 LAB — TROPONIN I: Troponin-I: <0.02 ng/mL

## 2011-04-14 ENCOUNTER — Inpatient Hospital Stay: Payer: Self-pay | Admitting: Internal Medicine

## 2011-04-14 DIAGNOSIS — R0602 Shortness of breath: Secondary | ICD-10-CM

## 2011-04-14 DIAGNOSIS — I369 Nonrheumatic tricuspid valve disorder, unspecified: Secondary | ICD-10-CM

## 2011-04-14 LAB — TROPONIN I
Troponin-I: <0.02 ng/mL
Troponin-I: <0.02 ng/mL

## 2011-04-14 LAB — POTASSIUM: Potassium: 4.1 mmol/L (ref 3.5–5.1)

## 2011-04-14 LAB — MAGNESIUM: Magnesium: 1.2 mg/dL — ABNORMAL LOW

## 2011-04-14 LAB — CK TOTAL AND CKMB (NOT AT ARMC)
CK, Total: 152 U/L (ref 35–232)
CK, Total: 114 U/L (ref 35–232)
CK-MB: 1.3 ng/mL (ref 0.5–3.6)
CK-MB: 1.3 ng/mL (ref 0.5–3.6)

## 2011-04-15 LAB — LIPID PANEL
HDL Cholesterol: 46 mg/dL (ref 40–60)
Ldl Cholesterol, Calc: 51 mg/dL (ref 0–100)
Triglycerides: 80 mg/dL (ref 0–200)
VLDL Cholesterol, Calc: 16 mg/dL (ref 5–40)

## 2011-04-15 LAB — BASIC METABOLIC PANEL
Anion Gap: 15 (ref 7–16)
BUN: 34 mg/dL — ABNORMAL HIGH (ref 7–18)
Chloride: 108 mmol/L — ABNORMAL HIGH (ref 98–107)
Creatinine: 1.75 mg/dL — ABNORMAL HIGH (ref 0.60–1.30)
EGFR (African American): 49 — ABNORMAL LOW
Potassium: 4.5 mmol/L (ref 3.5–5.1)

## 2011-04-15 LAB — CBC WITH DIFFERENTIAL/PLATELET
Basophil #: 0 10*3/uL (ref 0.0–0.1)
Basophil %: 0 %
Eosinophil %: 0 %
HCT: 38.4 % — ABNORMAL LOW (ref 40.0–52.0)
HGB: 12.5 g/dL — ABNORMAL LOW (ref 13.0–18.0)
Lymphocyte #: 0.7 10*3/uL — ABNORMAL LOW (ref 1.0–3.6)
MCH: 31.1 pg (ref 26.0–34.0)
MCHC: 32.7 g/dL (ref 32.0–36.0)
MCV: 95 fL (ref 80–100)
Monocyte #: 0.4 10*3/uL (ref 0.0–0.7)
Neutrophil #: 15.1 10*3/uL — ABNORMAL HIGH (ref 1.4–6.5)
Platelet: 220 10*3/uL (ref 150–440)
RDW: 17.2 % — ABNORMAL HIGH (ref 11.5–14.5)

## 2011-04-15 LAB — HEMOGLOBIN A1C: Hemoglobin A1C: 6.2 % (ref 4.2–6.3)

## 2011-04-21 ENCOUNTER — Ambulatory Visit: Payer: Medicare Other | Admitting: Cardiovascular Disease

## 2011-05-03 ENCOUNTER — Ambulatory Visit (INDEPENDENT_AMBULATORY_CARE_PROVIDER_SITE_OTHER): Payer: Medicare Other | Admitting: Internal Medicine

## 2011-05-03 ENCOUNTER — Encounter: Payer: Self-pay | Admitting: Internal Medicine

## 2011-05-03 DIAGNOSIS — I4891 Unspecified atrial fibrillation: Secondary | ICD-10-CM

## 2011-05-03 DIAGNOSIS — I10 Essential (primary) hypertension: Secondary | ICD-10-CM

## 2011-05-03 DIAGNOSIS — Z950 Presence of cardiac pacemaker: Secondary | ICD-10-CM

## 2011-05-03 DIAGNOSIS — I509 Heart failure, unspecified: Secondary | ICD-10-CM

## 2011-05-03 LAB — PACEMAKER DEVICE OBSERVATION
BMOD-0003RV: 30
BRDY-0002RV: 60 {beats}/min
BRDY-0004RV: 130 {beats}/min
VENTRICULAR PACING PM: 78

## 2011-05-03 NOTE — Assessment & Plan Note (Signed)
Interrogation of his pacemaker demonstrates normal function. We'll plan to recheck in several months.

## 2011-05-03 NOTE — Assessment & Plan Note (Signed)
His ventricular rate appears to be well-controlled. He is pacing approximately 70% of the time. He will continue his current dose of anti-coagulation.

## 2011-05-03 NOTE — Progress Notes (Signed)
HPI [name] returns today for followup. He is a pleasant 78 yo man with CAD, s/p MI, chronic atrial fib, syncope secondary to prolonged pauses and is s/p PPM. He denies c/p. He admits to being sedentary. He denies syncope, but does note dyspnea with exertion. Allergies  Allergen Reactions  . Atorvastatin      Current Outpatient Prescriptions  Medication Sig Dispense Refill  . aspirin 81 MG EC tablet Take 162 mg by mouth daily.       . dabigatran (PRADAXA) 150 MG CAPS Take 1 capsule (150 mg total) by mouth every 12 (twelve) hours.  60 capsule  6  . diltiazem (DILACOR XR) 180 MG 24 hr capsule Take 180 mg by mouth daily.        . ferrous sulfate 325 (65 FE) MG tablet Take 325 mg by mouth daily with breakfast.        . FLUoxetine (PROZAC) 20 MG capsule Take 20 mg by mouth daily.        [name] lisinopril (PRINIVIL,ZESTRIL) 10 MG tablet Take 10 mg by mouth daily.        . metoprolol (TOPROL-XL) 50 MG 24 hr tablet Take 50 mg by mouth daily.        [name] omeprazole (PRILOSEC) 20 MG capsule Take 20 mg by mouth daily.        . pravastatin (PRAVACHOL) 20 MG tablet Take 20 mg by mouth 2 (two) times daily at 10 AM and 5 PM.          Past Medical History  Diagnosis Date  . Chronic atrial fibrillation   . Coronary artery disease   . Chronic obstructive pulmonary disease   . GERD (gastroesophageal reflux disease)   . Hyperlipidemia   . Hypertension     ROS:   All systems reviewed and negative except as noted in the HPI.   Past Surgical History  Procedure Date  . Coronary artery bypass graft   . Abdominal aortic aneurysm repair   . Insert / replace / remove pacemaker      Family History  Problem Relation Age of Onset  . Stomach cancer Mother   . Diabetes Mother   . Hypertension Mother   . Coronary artery disease Mother   . Cerebral aneurysm Father   . Diabetes Father   . Hypertension Father   . Coronary artery disease Father      History   Social History  . Marital Status:  Married    Spouse Name: N/A    Number of Children: N/A  . Years of Education: N/A   Occupational History  . Not on file.   Social History Main Topics  . Smoking status: Former Smoker    Quit date: 04/11/1997  . Smokeless tobacco: Never Used  . Alcohol Use: Yes  . Drug Use: No  . Sexually Active: Not on file   Other Topics Concern  . Not on file   Social History Narrative  . No narrative on file     BP 110/66  Pulse 67  Ht 6\' 1"  (1.854 m)  Wt 104.509 kg (230 lb 6.4 oz)  BMI 30.40 kg/m2  SpO2 98%  Physical Exam:  Well appearing elderly man, NAD HEENT: Unremarkable Neck:  No JVD, no thyromegally Lungs:  Clear with no wheezes, rales, or rhonchi. Well-healed pacemaker incision. HEART:  Regular rate rhythm, no murmurs, no rubs, no clicks Abd:  soft, positive bowel sounds, no organomegally, no rebound, no guarding Ext:  2 plus  pulses, no edema, no cyanosis, no clubbing Skin:  No rashes no nodules Neuro:  CN II through XII intact, motor grossly intact  DEVICE  Normal device function.  See PaceArt for details.   Assess/Plan:

## 2011-05-03 NOTE — Assessment & Plan Note (Signed)
His symptoms are currently class II. He is sedentary. I've asked that he increase his physical activity. She will maintain a low-sodium diet and his current medical therapy.

## 2011-05-03 NOTE — Patient Instructions (Signed)
Follow up with Dr. Taylor in one year.  

## 2011-05-03 NOTE — Assessment & Plan Note (Signed)
His blood pressure is well-controlled today. He will continue his current medical therapy and maintain a low-sodium diet.

## 2011-08-04 ENCOUNTER — Ambulatory Visit (INDEPENDENT_AMBULATORY_CARE_PROVIDER_SITE_OTHER): Payer: Medicare Other | Admitting: *Deleted

## 2011-08-04 ENCOUNTER — Encounter: Payer: Self-pay | Admitting: Internal Medicine

## 2011-08-04 DIAGNOSIS — I4891 Unspecified atrial fibrillation: Secondary | ICD-10-CM

## 2011-08-08 LAB — REMOTE PACEMAKER DEVICE
BATTERY VOLTAGE: 2.77 V
BMOD-0005RV: 95 {beats}/min
BRDY-0002RV: 60 {beats}/min
RV LEAD THRESHOLD: 0.75 V
VENTRICULAR PACING PM: 90

## 2011-08-09 NOTE — Progress Notes (Signed)
Remote pacer check  

## 2011-08-10 ENCOUNTER — Telehealth: Payer: Self-pay | Admitting: Cardiovascular Disease

## 2011-08-10 NOTE — Telephone Encounter (Signed)
Pt wife states that he is having surgery on 5/10 and needs to know what medications he needs to stop and when. Please call pt to advise

## 2011-08-10 NOTE — Telephone Encounter (Signed)
Patient's wife would like to know what medications pt needs to stop prior surgery. Patient is having orthopedic surgery on one  his finder and a toe on 08/19/11. Spoke with Johnnie at Dr. Trenton Gammon office, she said that pt needs to stop take pradaxa and aspirin, for whatever time Dr. Mariah Milling agrees for  pt to be off prior surgery.

## 2011-08-11 NOTE — Telephone Encounter (Signed)
He would be acceptable risk to stop Plavix 5 days before the procedure. Would check with surgeon that they are not able to keep him on low-dose aspirin. If they definitively need him off aspirin, this is certainly a small risk that he will have to take. He could stop the aspirin if he is willing to take the  risk  No further workup needed for preop, acceptable risk overall from cardiac perspective

## 2011-08-11 NOTE — Telephone Encounter (Signed)
Returned call to Dr. Michaelle Copas office about pt ability to stay on low dose aspirin while Pradaxa is stopped for 5 days prior to surgery. Dayton Scrape will talk with Dr. Katrinka Blazing and return call to triage room tomorrow. Pt is having  Left hand Charley finger tendon release and a nodule removed from his left foot. Mylo Red RN

## 2011-08-12 ENCOUNTER — Ambulatory Visit: Payer: Self-pay | Admitting: Specialist

## 2011-08-12 LAB — BASIC METABOLIC PANEL
Anion Gap: 7 (ref 7–16)
Calcium, Total: 9.2 mg/dL (ref 8.5–10.1)
Co2: 27 mmol/L (ref 21–32)
Creatinine: 1.54 mg/dL — ABNORMAL HIGH (ref 0.60–1.30)
EGFR (African American): 50 — ABNORMAL LOW
EGFR (Non-African Amer.): 43 — ABNORMAL LOW
Glucose: 98 mg/dL (ref 65–99)
Potassium: 4.7 mmol/L (ref 3.5–5.1)

## 2011-08-12 LAB — CBC
MCHC: 33.2 g/dL (ref 32.0–36.0)
MCV: 99 fL (ref 80–100)
RBC: 3.97 10*6/uL — ABNORMAL LOW (ref 4.40–5.90)
RDW: 14.9 % — ABNORMAL HIGH (ref 11.5–14.5)
WBC: 7.2 10*3/uL (ref 3.8–10.6)

## 2011-08-17 NOTE — Telephone Encounter (Signed)
Dr.Smith's office called # [phone] was told okay with Dr.Gollan to have patient hold plavix 5 days before surgery.If okay with Dr.Smith patient can remain on low dose aspirin.Also was told patient's wife stated patient does not take plavix any more.

## 2011-08-19 ENCOUNTER — Ambulatory Visit: Payer: Self-pay | Admitting: Specialist

## 2011-08-26 ENCOUNTER — Encounter: Payer: Self-pay | Admitting: *Deleted

## 2011-09-27 ENCOUNTER — Ambulatory Visit (INDEPENDENT_AMBULATORY_CARE_PROVIDER_SITE_OTHER): Payer: Medicare Other | Admitting: Cardiovascular Disease

## 2011-09-27 ENCOUNTER — Encounter: Payer: Self-pay | Admitting: Cardiovascular Disease

## 2011-09-27 VITALS — BP 142/86 | HR 84 | Ht 72.0 in | Wt 241.0 lb

## 2011-09-27 DIAGNOSIS — R0602 Shortness of breath: Secondary | ICD-10-CM

## 2011-09-27 DIAGNOSIS — I25810 Atherosclerosis of coronary artery bypass graft(s) without angina pectoris: Secondary | ICD-10-CM

## 2011-09-27 DIAGNOSIS — I10 Essential (primary) hypertension: Secondary | ICD-10-CM

## 2011-09-27 DIAGNOSIS — I4891 Unspecified atrial fibrillation: Secondary | ICD-10-CM

## 2011-09-27 DIAGNOSIS — Z950 Presence of cardiac pacemaker: Secondary | ICD-10-CM

## 2011-09-27 DIAGNOSIS — R079 Chest pain, unspecified: Secondary | ICD-10-CM

## 2011-09-27 NOTE — Assessment & Plan Note (Signed)
Blood pressure is well controlled on today's visit. No changes made to the medications. 

## 2011-09-27 NOTE — Assessment & Plan Note (Signed)
Followed by Dr. Taylor 

## 2011-09-27 NOTE — Progress Notes (Signed)
Patient ID: [name], male    DOB: 12/16/1933, 78 y.o.   MRN: [id number]  HPI Comments: 78 year old gentleman with a history of coronary artery disease, bypass surgery in 1999 with catheterization in 2007 with patent LIMA to the LAD, occluded vein graft to the OM, history of Cypher stent to the PL branch and mid RCA in September 07, ejection fraction 45-50%, chronic atrial fibrillation with Medtronic pacemaker, hx of previous recurrent falls and injury from alcohol abuse (stopped drinking more recently), Pacemaker was placed by Dr. Lynnea Ferrier in 2010 for syncope, AFib with pauses. He presents for routine followup.   Mr. Thiam has not been drinking alcohol by report and has had less falls. He has had no complications on anticoagulation. He overall feels well with no complaints. He does feel tired with no energy. He does not do any regular exercise . Previously he was sleeping most of the day and awake at nighttime. He has tried to turn this around . He is no longer walking with a cane.   He is status post right knee replacement.  EKG shows paced rhythm at 84 beats per minute   Outpatient Encounter Prescriptions as of 09/27/2011  Medication Sig Dispense Refill  . aspirin 81 MG EC tablet Take 162 mg by mouth daily.       . dabigatran (PRADAXA) 150 MG CAPS Take 1 capsule (150 mg total) by mouth every 12 (twelve) hours.  60 capsule  6  . diltiazem (DILACOR XR) 180 MG 24 hr capsule Take 180 mg by mouth daily.        [name] lisinopril (PRINIVIL,ZESTRIL) 10 MG tablet Take 10 mg by mouth daily.        . metoprolol succinate (TOPROL-XL) 100 MG 24 hr tablet Take 100 mg by mouth daily. Take with or immediately following a meal.      . omeprazole (PRILOSEC) 20 MG capsule Take 20 mg by mouth daily.        . pravastatin (PRAVACHOL) 20 MG tablet Take 20 mg by mouth daily.       . Tamsulosin HCl (FLOMAX) 0.4 MG CAPS Take 0.4 mg by mouth daily after supper.      [name] DISCONTD: ferrous sulfate 325 (65 FE) MG tablet  Take 325 mg by mouth daily with breakfast.        . DISCONTD: FLUoxetine (PROZAC) 20 MG capsule Take 20 mg by mouth daily.        [name] DISCONTD: metoprolol (TOPROL-XL) 50 MG 24 hr tablet Take 50 mg by mouth daily.         Review of Systems  Constitutional: Negative.   HENT: Negative.   Eyes: Negative.   Respiratory: Negative.   Cardiovascular: Negative.   Gastrointestinal: Negative.   Musculoskeletal: Negative.   Skin: Negative.   Neurological: Negative.   Hematological: Negative.   Psychiatric/Behavioral: Negative.   All other systems reviewed and are negative.    BP 142/86  Pulse 84  Ht 6' (1.829 m)  Wt 241 lb (109.317 kg)  BMI 32.69 kg/m2  Physical Exam  Nursing note and vitals reviewed. Constitutional: He is oriented to person, place, and time. He appears well-developed and well-nourished.  HENT:  Head: Normocephalic.  Nose: Nose normal.  Mouth/Throat: Oropharynx is clear and moist.  Eyes: Conjunctivae are normal. Pupils are equal, round, and reactive to light.  Neck: Normal range of motion. Neck supple. No JVD present.  Cardiovascular: Normal rate, regular rhythm, S1 normal, S2 normal, normal heart sounds  and intact distal pulses.  Exam reveals no gallop and no friction rub.   No murmur heard. Pulmonary/Chest: Effort normal and breath sounds normal. No respiratory distress. He has no wheezes. He has no rales. He exhibits no tenderness.  Abdominal: Soft. Bowel sounds are normal. He exhibits no distension. There is no tenderness.  Musculoskeletal: Normal range of motion. He exhibits no edema and no tenderness.  Lymphadenopathy:    He has no cervical adenopathy.  Neurological: He is alert and oriented to person, place, and time. Coordination normal.  Skin: Skin is warm and dry. No rash noted. No erythema.  Psychiatric: He has a normal mood and affect. His behavior is normal. Judgment and thought content normal.           Assessment and Plan

## 2011-09-27 NOTE — Assessment & Plan Note (Signed)
Currently with no symptoms of angina. No further workup at this time. Continue current medication regimen. 

## 2011-09-27 NOTE — Patient Instructions (Addendum)
You are doing well. No medication changes were made. Try zyrtec ([facility]-TEC from [facility]) for allergies.  Check the price of Xarelto   Please call us if you have new issues that need to be addressed before your next appt.  Your physician wants you to follow-up in: 6 months.  You will receive a reminder letter in the mail two months in advance. If you don't receive a letter, please call our office to schedule the follow-up appointment.

## 2011-09-27 NOTE — Assessment & Plan Note (Signed)
We have suggested he stay on anticoagulation. He will continue pradaxa twice a day.

## 2011-11-10 ENCOUNTER — Encounter: Payer: Self-pay | Admitting: Internal Medicine

## 2011-11-10 ENCOUNTER — Ambulatory Visit (INDEPENDENT_AMBULATORY_CARE_PROVIDER_SITE_OTHER): Payer: Medicare Other | Admitting: *Deleted

## 2011-11-10 DIAGNOSIS — I4891 Unspecified atrial fibrillation: Secondary | ICD-10-CM

## 2011-11-10 DIAGNOSIS — Z950 Presence of cardiac pacemaker: Secondary | ICD-10-CM

## 2011-11-15 LAB — REMOTE PACEMAKER DEVICE
BMOD-0003RV: 30
BMOD-0005RV: 95 {beats}/min
BRDY-0004RV: 130 {beats}/min
VENTRICULAR PACING PM: 86

## 2011-11-24 ENCOUNTER — Encounter: Payer: Self-pay | Admitting: *Deleted

## 2012-01-16 ENCOUNTER — Observation Stay: Payer: Self-pay | Admitting: Internal Medicine

## 2012-01-16 ENCOUNTER — Telehealth: Payer: Self-pay

## 2012-01-16 LAB — URINALYSIS, COMPLETE
Bilirubin,UR: NEGATIVE
Ketone: NEGATIVE
Ph: 6 (ref 4.5–8.0)
Protein: NEGATIVE
RBC,UR: 1 /HPF (ref 0–5)
Specific Gravity: 1.006 (ref 1.003–1.030)
Squamous Epithelial: NONE SEEN
WBC UR: <1 /HPF (ref 0–5)

## 2012-01-16 LAB — CBC WITH DIFFERENTIAL/PLATELET
Basophil #: 0.1 10*3/uL (ref 0.0–0.1)
Basophil %: 0.8 %
Eosinophil #: 0.5 10*3/uL (ref 0.0–0.7)
Eosinophil %: 7.1 %
HCT: 40.4 % (ref 40.0–52.0)
Lymphocyte #: 1.3 10*3/uL (ref 1.0–3.6)
Lymphocyte %: 19.7 %
MCH: 31.8 pg (ref 26.0–34.0)
MCHC: 33.2 g/dL (ref 32.0–36.0)
Monocyte #: 0.6 x10 3/mm (ref 0.2–1.0)
Monocyte %: 9.2 %
Neutrophil #: 4.2 10*3/uL (ref 1.4–6.5)
Neutrophil %: 63.2 %
RBC: 4.21 10*6/uL — ABNORMAL LOW (ref 4.40–5.90)
RDW: 14.2 % (ref 11.5–14.5)

## 2012-01-16 LAB — TROPONIN I
Troponin-I: <0.02 ng/mL
Troponin-I: <0.02 ng/mL

## 2012-01-16 LAB — CK TOTAL AND CKMB (NOT AT ARMC)
CK, Total: 122 U/L (ref 35–232)
CK-MB: 1.9 ng/mL (ref 0.5–3.6)
CK-MB: 1.7 ng/mL (ref 0.5–3.6)

## 2012-01-16 LAB — COMPREHENSIVE METABOLIC PANEL
Albumin: 3.8 g/dL (ref 3.4–5.0)
Alkaline Phosphatase: 83 U/L (ref 50–136)
Anion Gap: 9 (ref 7–16)
BUN: 18 mg/dL (ref 7–18)
Chloride: 108 mmol/L — ABNORMAL HIGH (ref 98–107)
Creatinine: 1.76 mg/dL — ABNORMAL HIGH (ref 0.60–1.30)
Glucose: 117 mg/dL — ABNORMAL HIGH (ref 65–99)
Osmolality: 288 (ref 275–301)
SGOT(AST): 25 U/L (ref 15–37)
SGPT (ALT): 28 U/L (ref 12–78)
Sodium: 143 mmol/L (ref 136–145)
Total Protein: 7.5 g/dL (ref 6.4–8.2)

## 2012-01-16 LAB — APTT: Activated PTT: 49.6 secs — ABNORMAL HIGH (ref 23.6–35.9)

## 2012-01-16 NOTE — Telephone Encounter (Signed)
Pt wife calling says pt has been c/o worsening sob and CP over the last 3 days. She says he has been  "moaning and yelling all night". I asked why and she says its because of pain in his chest, associated with severe dyspnea.  He is having active symptoms right now and wife wants pt to be seen this AM.  I explained Dr. Mariah Milling not in office until 1000 this am and I do not believe pt should wait this long.  He should go to ER now, especially considering extensive CAD hx.  She agrees and will have pt go to hosp. Now.

## 2012-01-17 LAB — CK TOTAL AND CKMB (NOT AT ARMC)
CK, Total: 113 U/L (ref 35–232)
CK-MB: 1.6 ng/mL (ref 0.5–3.6)

## 2012-01-26 ENCOUNTER — Encounter: Payer: Self-pay | Admitting: Cardiovascular Disease

## 2012-01-26 ENCOUNTER — Ambulatory Visit (INDEPENDENT_AMBULATORY_CARE_PROVIDER_SITE_OTHER): Payer: Medicare Other | Admitting: Cardiovascular Disease

## 2012-01-26 VITALS — BP 104/64 | HR 78 | Ht 73.0 in | Wt 250.5 lb

## 2012-01-26 DIAGNOSIS — I2510 Atherosclerotic heart disease of native coronary artery without angina pectoris: Secondary | ICD-10-CM

## 2012-01-26 DIAGNOSIS — I509 Heart failure, unspecified: Secondary | ICD-10-CM

## 2012-01-26 DIAGNOSIS — I5031 Acute diastolic (congestive) heart failure: Secondary | ICD-10-CM

## 2012-01-26 DIAGNOSIS — R0602 Shortness of breath: Secondary | ICD-10-CM

## 2012-01-26 DIAGNOSIS — I10 Essential (primary) hypertension: Secondary | ICD-10-CM

## 2012-01-26 DIAGNOSIS — Z950 Presence of cardiac pacemaker: Secondary | ICD-10-CM

## 2012-01-26 DIAGNOSIS — E785 Hyperlipidemia, unspecified: Secondary | ICD-10-CM

## 2012-01-26 DIAGNOSIS — I25810 Atherosclerosis of coronary artery bypass graft(s) without angina pectoris: Secondary | ICD-10-CM

## 2012-01-26 DIAGNOSIS — I4891 Unspecified atrial fibrillation: Secondary | ICD-10-CM

## 2012-01-26 DIAGNOSIS — I5032 Chronic diastolic (congestive) heart failure: Secondary | ICD-10-CM | POA: Insufficient documentation

## 2012-01-26 MED ORDER — FUROSEMIDE 20 MG PO TABS: 20.0000 mg | ORAL_TABLET | Freq: Two times a day (BID) | ORAL | Status: DC

## 2012-01-26 NOTE — Assessment & Plan Note (Signed)
Followed by [place] [facility]. 

## 2012-01-26 NOTE — Assessment & Plan Note (Signed)
We have suggested he stay on his statin. Repeat blood work needed. He is scheduled to followup with Dr. Burnett Sheng for new patient evaluation.

## 2012-01-26 NOTE — Progress Notes (Signed)
Patient ID: [name], male    DOB: 29-Feb-1934, 78 y.o.   MRN: [id number]  HPI Comments: 78 year old gentleman with a history of coronary artery disease, bypass surgery in 1999 with catheterization in 2007 with patent LIMA to the LAD, occluded vein graft to the OM, history of Cypher stent to the PL branch and mid RCA in September 07, ejection fraction 45-50%, chronic atrial fibrillation with Medtronic pacemaker, hx of previous recurrent falls and injury from alcohol abuse, Pacemaker  in 2010 for syncope, AFib with pauses. He presents for routine followup.  He was recently in the hospital 01/16/2012  and discharged on October 8, admitted with shortness of breath, suspected diastolic CHF. He was given IV Lasix with improvement of his symptoms. He reports he has been drinking a significant amount of water. He was having several pillow orthopnea and PND, no leg swelling. He was discharged on Lasix 20 mg twice a day but has only been taking Lasix daily. BNP in the hospital was 1700, initial creatinine 1.76 normal LFTs  Since his discharge, he continues to feel mild shortness of breath. He continues to drink significant by mouth fluid. He has not been weighing himself at home   He is status post right knee replacement.  EKG shows paced rhythm at78 beats per minute   Outpatient Encounter Prescriptions as of 01/26/2012  Medication Sig Dispense Refill  . aspirin 81 MG EC tablet Take 162 mg by mouth daily.       . dabigatran (PRADAXA) 150 MG CAPS Take 1 capsule (150 mg total) by mouth every 12 (twelve) hours.  60 capsule  6  . diltiazem (DILACOR XR) 180 MG 24 hr capsule Take 180 mg by mouth daily.        . furosemide (LASIX) 20 MG tablet Take 1 tablet (20 mg total) by mouth 2 (two) times daily.  180 tablet  3  . lisinopril (PRINIVIL,ZESTRIL) 10 MG tablet Take 10 mg by mouth daily.        . metoprolol succinate (TOPROL-XL) 100 MG 24 hr tablet Take 100 mg by mouth daily. Take with or immediately  following a meal.      . omeprazole (PRILOSEC) 20 MG capsule Take 20 mg by mouth daily.        . potassium chloride SA (K-DUR,KLOR-CON) 20 MEQ tablet Take 20 mEq by mouth daily.      . pravastatin (PRAVACHOL) 20 MG tablet Take 20 mg by mouth daily.       . Tamsulosin HCl (FLOMAX) 0.4 MG CAPS Take 0.4 mg by mouth daily after supper.      [name] DISCONTD: furosemide (LASIX) 20 MG tablet Take 20 mg by mouth daily.       Review of Systems  Constitutional: Negative.   HENT: Negative.   Eyes: Negative.   Respiratory: Positive for shortness of breath.   Cardiovascular: Negative.   Gastrointestinal: Negative.   Musculoskeletal: Negative.   Skin: Negative.   Neurological: Negative.   Hematological: Negative.   Psychiatric/Behavioral: Negative.   All other systems reviewed and are negative.    BP 104/64  Pulse 78  Ht 6\' 1"  (1.854 m)  Wt 250 lb 8 oz (113.626 kg)  BMI 33.05 kg/m2  Physical Exam  Nursing note and vitals reviewed. Constitutional: He is oriented to person, place, and time. He appears well-developed and well-nourished.  HENT:  Head: Normocephalic.  Nose: Nose normal.  Mouth/Throat: Oropharynx is clear and moist.  Eyes: Conjunctivae normal are normal. Pupils  are equal, round, and reactive to light.  Neck: Normal range of motion. Neck supple. No JVD present.  Cardiovascular: Normal rate, regular rhythm, S1 normal, S2 normal, normal heart sounds and intact distal pulses.  Exam reveals no gallop and no friction rub.   No murmur heard. Pulmonary/Chest: Effort normal and breath sounds normal. No respiratory distress. He has no wheezes. He has no rales. He exhibits no tenderness.  Abdominal: Soft. Bowel sounds are normal. He exhibits no distension. There is no tenderness.  Musculoskeletal: Normal range of motion. He exhibits no edema and no tenderness.  Lymphadenopathy:    He has no cervical adenopathy.  Neurological: He is alert and oriented to person, place, and time. Coordination  normal.  Skin: Skin is warm and dry. No rash noted. No erythema.  Psychiatric: He has a normal mood and affect. His behavior is normal. Judgment and thought content normal.           Assessment and Plan

## 2012-01-26 NOTE — Patient Instructions (Signed)
You are doing well. No medication changes were made.  Decrease fluid intake Take extra lasix, up to two a day for worsening shortness of breath or edema  Please call us if you have new issues that need to be addressed before your next appt.  Your physician wants you to follow-up in: 3 months.  You will receive a reminder letter in the mail two months in advance. If you don't receive a letter, please call our office to schedule the follow-up appointment.

## 2012-01-26 NOTE — Assessment & Plan Note (Signed)
Blood pressure is well controlled on today's visit. No changes made to the medications. 

## 2012-01-26 NOTE — Assessment & Plan Note (Signed)
Currently on warfarin. Paced rhythm 

## 2012-01-26 NOTE — Assessment & Plan Note (Signed)
Currently with no symptoms of angina. No further workup at this time. Continue current medication regimen. 

## 2012-01-26 NOTE — Assessment & Plan Note (Signed)
Recent discharge 10 days ago. We have suggested he decrease his fluid intake and stay on Lasix daily. He would likely need Lasix twice a day for continued symptoms of shortness of breath. We have suggested he watch his weight closely and take extra Lasix for any fluid gain/weight gain.

## 2012-02-13 ENCOUNTER — Encounter: Payer: Self-pay | Admitting: Internal Medicine

## 2012-02-13 ENCOUNTER — Ambulatory Visit (INDEPENDENT_AMBULATORY_CARE_PROVIDER_SITE_OTHER): Payer: Medicare Other | Admitting: *Deleted

## 2012-02-13 DIAGNOSIS — I4891 Unspecified atrial fibrillation: Secondary | ICD-10-CM

## 2012-02-13 DIAGNOSIS — Z950 Presence of cardiac pacemaker: Secondary | ICD-10-CM

## 2012-02-20 LAB — REMOTE PACEMAKER DEVICE
BATTERY VOLTAGE: 2.77 V
BMOD-0003RV: 30
BMOD-0005RV: 95 {beats}/min
RV LEAD IMPEDENCE PM: 451 Ohm

## 2012-02-28 ENCOUNTER — Encounter: Payer: Self-pay | Admitting: *Deleted

## 2012-03-30 ENCOUNTER — Inpatient Hospital Stay: Payer: Self-pay | Admitting: Internal Medicine

## 2012-03-30 DIAGNOSIS — I5042 Chronic combined systolic (congestive) and diastolic (congestive) heart failure: Secondary | ICD-10-CM

## 2012-03-30 DIAGNOSIS — I4891 Unspecified atrial fibrillation: Secondary | ICD-10-CM

## 2012-03-30 DIAGNOSIS — N189 Chronic kidney disease, unspecified: Secondary | ICD-10-CM

## 2012-03-30 LAB — URINALYSIS, COMPLETE
Blood: NEGATIVE
Ketone: NEGATIVE
Leukocyte Esterase: NEGATIVE
Ph: 5 (ref 4.5–8.0)
Protein: NEGATIVE
Specific Gravity: 1.014 (ref 1.003–1.030)
WBC UR: 2 /HPF (ref 0–5)

## 2012-03-30 LAB — COMPREHENSIVE METABOLIC PANEL
Albumin: 3.5 g/dL (ref 3.4–5.0)
Alkaline Phosphatase: 84 U/L (ref 50–136)
Anion Gap: 12 (ref 7–16)
BUN: 65 mg/dL — ABNORMAL HIGH (ref 7–18)
Bilirubin,Total: 0.3 mg/dL (ref 0.2–1.0)
Calcium, Total: 8.9 mg/dL (ref 8.5–10.1)
Co2: 19 mmol/L — ABNORMAL LOW (ref 21–32)
EGFR (Non-African Amer.): 16 — ABNORMAL LOW
Osmolality: 293 (ref 275–301)
Potassium: 4 mmol/L (ref 3.5–5.1)
Sodium: 137 mmol/L (ref 136–145)
Total Protein: 7.4 g/dL (ref 6.4–8.2)

## 2012-03-30 LAB — CBC
HCT: 36.2 % — ABNORMAL LOW (ref 40.0–52.0)
HGB: 12.1 g/dL — ABNORMAL LOW (ref 13.0–18.0)
MCH: 31.3 pg (ref 26.0–34.0)
MCV: 94 fL (ref 80–100)
Platelet: 235 10*3/uL (ref 150–440)
RBC: 3.87 10*6/uL — ABNORMAL LOW (ref 4.40–5.90)
WBC: 6.8 10*3/uL (ref 3.8–10.6)

## 2012-03-31 LAB — BASIC METABOLIC PANEL
Calcium, Total: 9 mg/dL (ref 8.5–10.1)
Chloride: 109 mmol/L — ABNORMAL HIGH (ref 98–107)
Co2: 20 mmol/L — ABNORMAL LOW (ref 21–32)
Creatinine: 2.61 mg/dL — ABNORMAL HIGH (ref 0.60–1.30)
EGFR (African American): 26 — ABNORMAL LOW
EGFR (Non-African Amer.): 23 — ABNORMAL LOW
Glucose: 111 mg/dL — ABNORMAL HIGH (ref 65–99)
Osmolality: 291 (ref 275–301)
Potassium: 4.3 mmol/L (ref 3.5–5.1)
Sodium: 138 mmol/L (ref 136–145)

## 2012-03-31 LAB — CBC WITH DIFFERENTIAL/PLATELET
Basophil %: 1.1 %
Eosinophil #: 0.5 10*3/uL (ref 0.0–0.7)
Lymphocyte #: 1.3 10*3/uL (ref 1.0–3.6)
MCH: 31.7 pg (ref 26.0–34.0)
MCHC: 33.8 g/dL (ref 32.0–36.0)
MCV: 94 fL (ref 80–100)
Monocyte #: 0.9 x10 3/mm (ref 0.2–1.0)
Monocyte %: 14 %
Neutrophil #: 3.4 10*3/uL (ref 1.4–6.5)
Neutrophil %: 55.8 %
RBC: 3.49 10*6/uL — ABNORMAL LOW (ref 4.40–5.90)
WBC: 6.2 10*3/uL (ref 3.8–10.6)

## 2012-03-31 LAB — MAGNESIUM: Magnesium: 1.5 mg/dL — ABNORMAL LOW

## 2012-04-01 LAB — BASIC METABOLIC PANEL
BUN: 35 mg/dL — ABNORMAL HIGH (ref 7–18)
Calcium, Total: 9 mg/dL (ref 8.5–10.1)
EGFR (African American): 38 — ABNORMAL LOW
EGFR (Non-African Amer.): 33 — ABNORMAL LOW
Glucose: 116 mg/dL — ABNORMAL HIGH (ref 65–99)
Potassium: 4.9 mmol/L (ref 3.5–5.1)
Sodium: 136 mmol/L (ref 136–145)

## 2012-04-01 LAB — MAGNESIUM: Magnesium: 1.8 mg/dL

## 2012-04-02 ENCOUNTER — Encounter: Payer: Self-pay | Admitting: *Deleted

## 2012-04-02 ENCOUNTER — Telehealth: Payer: Self-pay | Admitting: *Deleted

## 2012-04-02 NOTE — Telephone Encounter (Signed)
I spoke with [name] and he is doing well today. Reviewed a low sodium diet with him as well as daily weights. He likes macaroni and cheese. Will reduce portion size and try to avoid those high sodium foods. He is aware of his appointmen 04/09/12 with Ward Givens NP. Mylo Red RN\

## 2012-04-09 ENCOUNTER — Ambulatory Visit (INDEPENDENT_AMBULATORY_CARE_PROVIDER_SITE_OTHER): Payer: Medicare Other | Admitting: Nurse Practitioner

## 2012-04-09 ENCOUNTER — Telehealth: Payer: Self-pay | Admitting: Internal Medicine

## 2012-04-09 ENCOUNTER — Encounter: Payer: Self-pay | Admitting: Nurse Practitioner

## 2012-04-09 VITALS — BP 110/70 | HR 86 | Ht 73.0 in | Wt 240.0 lb

## 2012-04-09 DIAGNOSIS — R0602 Shortness of breath: Secondary | ICD-10-CM

## 2012-04-09 DIAGNOSIS — I25810 Atherosclerosis of coronary artery bypass graft(s) without angina pectoris: Secondary | ICD-10-CM

## 2012-04-09 DIAGNOSIS — R0789 Other chest pain: Secondary | ICD-10-CM

## 2012-04-09 DIAGNOSIS — I959 Hypotension, unspecified: Secondary | ICD-10-CM

## 2012-04-09 DIAGNOSIS — N179 Acute kidney failure, unspecified: Secondary | ICD-10-CM

## 2012-04-09 DIAGNOSIS — N189 Chronic kidney disease, unspecified: Secondary | ICD-10-CM

## 2012-04-09 DIAGNOSIS — I5032 Chronic diastolic (congestive) heart failure: Secondary | ICD-10-CM

## 2012-04-09 NOTE — Telephone Encounter (Signed)
Pt wants to know if he needs to keep appt with Dr Ladona Ridgel in January. Pt device was checked while in hospital

## 2012-04-09 NOTE — Patient Instructions (Addendum)
Your physician wants you to follow-up in: 04/27/12 with Dr. Mariah Milling as scheduled. You will receive a reminder letter in the mail two months in advance. If you don't receive a letter, please call our office to schedule the follow-up appointment.

## 2012-04-09 NOTE — Progress Notes (Signed)
Patient Name: [name] Date of Encounter: 04/09/2012  Primary Care Provider:  Jerl Mina, MD Primary Cardiologist:  Concha Se, MD  Patient Profile  78 y/o male who was recently hospitalized @ [facility] with hypotn and renal failure who presents for f/u.  Problem List   Past Medical History  Diagnosis Date  . Chronic atrial fibrillation     a. on pradaxa  . Coronary artery disease     a. s/p MI x 3;  b. s/p CABG in 1999;  c. Cath 2007: LIMA->LAD patent, VG->OM 100, PCI/DES of RPL/mid RCA (cypher DES).  . Chronic obstructive pulmonary disease   . GERD (gastroesophageal reflux disease)   . Hyperlipidemia   . Hypertension   . PVD (peripheral vascular disease)   . AAA (abdominal aortic aneurysm)     a. s/p repair  . Chronic diastolic CHF (congestive heart failure)     a. 01/2012 Echo:  EF 55%.  . Tachy-brady syndrome     a. s/p PPM 2010.  [name] CKD (chronic kidney disease), stage III     a. Acute on chronic 03/2012   Past Surgical History  Procedure Date  . Coronary artery bypass graft   . Abdominal aortic aneurysm repair   . Insert / replace / remove pacemaker   . Foot surgery     left foot surgery to remove cyst.  . Trigger finger release     Allergies  Allergies  Allergen Reactions  . Atorvastatin     HPI  78 y/o male with the above complex problem list.  He was admitted to [facility] on 1/20 with complaints of malaise, wkns, back pain, and hypotn.  Creatinine was elevated @ 3.46.  He was admitted and hydrated.  Antihypertensives, including lisinopril and furosemide, were held.  Off of meds, BP improved and creatinine stabilized to near his baseline @ 1.94.  His home doses of metoprolol & Dilt were resumed, as was pradaxa (held in setting of ARF), and he was d/c'd off of lisinopril and lasix.  Since d/c, he has been feeling increasingly better.  He denies pnd, orthopnea, n, v, dizziness, syncope, edema, weight gain, or early satiety.  He does have chronic DOE that  occurs after walking about 100 yards.  He notes that this has been this way for years.  He has not had any chest pain/pressure.  He weighs himself daily and his weight has been stable.  Home Medications  Prior to Admission medications   Medication Sig Start Date End Date Taking? Authorizing Provider  aspirin 81 MG EC tablet Take 162 mg by mouth daily.    Yes Historical Provider, MD  dabigatran (PRADAXA) 150 MG CAPS Take 1 capsule (150 mg total) by mouth every 12 (twelve) hours. 03/25/11  Yes Antonieta Iba, MD  diltiazem (DILACOR XR) 180 MG 24 hr capsule Take 180 mg by mouth daily.     Yes Historical Provider, MD  metoprolol succinate (TOPROL-XL) 100 MG 24 hr tablet Take 100 mg by mouth daily. Take with or immediately following a meal.   Yes Historical Provider, MD  omeprazole (PRILOSEC) 20 MG capsule Take 20 mg by mouth daily.     Yes Historical Provider, MD  pravastatin (PRAVACHOL) 20 MG tablet Take 20 mg by mouth daily.    Yes Historical Provider, MD  Tamsulosin HCl (FLOMAX) 0.4 MG CAPS Take 0.4 mg by mouth daily after supper.   Yes Historical Provider, MD    Review of Systems  As above, he  has some degree of chronic DOE.  He denies chest pain, pnd, orthopnea, n, v, dizziness, syncope, edema, weight gain, or early satiety. All other systems reviewed and are otherwise negative except as noted above.  Physical Exam  Blood pressure 110/70, pulse 86, height 6\' 1"  (1.854 m), weight 240 lb (108.863 kg), SpO2 97.00%.  General: Pleasant, NAD Psych: Normal affect. Neuro: Alert and oriented X 3. Moves all extremities spontaneously. HEENT: Normal  Neck: Supple without bruits or JVD. Lungs:  Resp regular and unlabored, CTA. Heart: irregular, no s3, s4, or murmurs. Abdomen: Soft, non-tender, non-distended, BS + x 4.  Extremities: No clubbing, cyanosis or edema. DP/PT/Radials 1+ and equal bilaterally.  Accessory Clinical Findings  ECG - paced, 86, underlying afib.  Assessment & Plan  1.   Hypotension:  Resolved.  BP looks good today.  His strength has improved.  He remains off of lisinopril and lasix for the time being.  2.  Acute on chronic stage III CKD:  F/u BMET today.  3.  Chronic Diastolic CHF:  Volume looks good.  Weight has been stable.  HR/BP well controlled.  He is currently off of lasix.  F/U bmet today.  4.  Chronic Afib/tachy-brady syndrome:  Rate-controlled on bb/dilt.  Cont pradaxa.  Device was interrogated while hospitalized and reportedly nl.  He has f/u with Dr. Ladona Ridgel @ the end of January.  5.  CAD:  No chest pain.  He has chronic doe.  He had nl troponins while hospitalized despite hypotension.  Cont asa, bb, statin therapy.  6.  Dispo:  BMET today.  F/U with Dr. Mariah Milling as scheduled next month.  Nicolasa Ducking, NP 04/09/2012, 10:26 AM

## 2012-04-09 NOTE — Telephone Encounter (Signed)
I dont see record of this in [facility] records Does he need to keep device appt as scheduled?

## 2012-04-09 NOTE — Addendum Note (Signed)
Addended by: Nicolasa Ducking R on: 04/09/2012 11:55 AM   Modules accepted: Level of Service

## 2012-04-10 LAB — BASIC METABOLIC PANEL
Calcium: 9.3 mg/dL (ref 8.6–10.2)
Chloride: 102 mmol/L (ref 97–108)
GFR calc Af Amer: 46 mL/min/{1.73_m2} — ABNORMAL LOW (ref >59)
GFR calc non Af Amer: 39 mL/min/{1.73_m2} — ABNORMAL LOW (ref >59)
Glucose: 126 mg/dL — ABNORMAL HIGH (ref 65–99)
Potassium: 5.1 mmol/L (ref 3.5–5.2)
Sodium: 141 mmol/L (ref 134–144)

## 2012-04-16 NOTE — Telephone Encounter (Signed)
Spoke with wife and explained that [name] does need to keep his scheduled appointment with Dr. Ladona Ridgel in [place] since he has not seen him in 1 year.

## 2012-04-27 ENCOUNTER — Ambulatory Visit (INDEPENDENT_AMBULATORY_CARE_PROVIDER_SITE_OTHER): Payer: Medicare Other | Admitting: Cardiovascular Disease

## 2012-04-27 ENCOUNTER — Encounter: Payer: Self-pay | Admitting: Cardiovascular Disease

## 2012-04-27 VITALS — BP 130/80 | HR 76 | Ht 73.0 in | Wt 244.8 lb

## 2012-04-27 DIAGNOSIS — N183 Chronic kidney disease, stage 3 unspecified: Secondary | ICD-10-CM

## 2012-04-27 DIAGNOSIS — I4891 Unspecified atrial fibrillation: Secondary | ICD-10-CM

## 2012-04-27 DIAGNOSIS — I509 Heart failure, unspecified: Secondary | ICD-10-CM

## 2012-04-27 DIAGNOSIS — I10 Essential (primary) hypertension: Secondary | ICD-10-CM

## 2012-04-27 DIAGNOSIS — Z950 Presence of cardiac pacemaker: Secondary | ICD-10-CM

## 2012-04-27 NOTE — Assessment & Plan Note (Signed)
He has followup with Dr. Ladona Ridgel in 2 weeks' time for annual review.

## 2012-04-27 NOTE — Assessment & Plan Note (Signed)
We'll continue on pradaxa twice a day. Rate is well controlled.

## 2012-04-27 NOTE — Progress Notes (Signed)
Patient ID: [name], male    DOB: 20-May-1933, 79 y.o.   MRN: [id number]  HPI Comments: 79 year old gentleman with a history of coronary artery disease, bypass surgery in 1999 with catheterization in 2007 with patent LIMA to the LAD, occluded vein graft to the OM, history of Cypher stent to the PL branch and mid RCA in September 07, ejection fraction 45-50%, chronic atrial fibrillation with Medtronic pacemaker, hx of previous recurrent falls and injury from alcohol abuse (stopped drinking more recently), Pacemaker was placed by Dr. Lynnea Ferrier in 2010 for syncope, AFib with pauses. He presents for routine followup.   Recent hospitalization for malaise, dehydration, creatinine greater than 3. Lisinopril and Lasix was discontinued. Recent followup post hospital visit with Ward Givens in our clinic and he was doing relatively well. No medication changes made. Today he reports that he is doing well. No significant change in his weight. Chronic mild shortness of breath which is unchanged. No edema. Overall he feels the best yesterday and today and he is soaked in a long time.  Gait is poor He is no longer walking with a cane. He is status post right knee replacement.   room in work shows total cholesterol 175, LDL 100, HDL 56, creatinine 1.3 to   Outpatient Encounter Prescriptions as of 04/27/2012  Medication Sig Dispense Refill  . aspirin 81 MG EC tablet Take 162 mg by mouth daily.       . dabigatran (PRADAXA) 150 MG CAPS Take 1 capsule (150 mg total) by mouth every 12 (twelve) hours.  60 capsule  6  . diltiazem (DILACOR XR) 180 MG 24 hr capsule Take 180 mg by mouth daily.        . metoprolol succinate (TOPROL-XL) 100 MG 24 hr tablet Take 100 mg by mouth daily. Take with or immediately following a meal.      . Multiple Vitamin (MULTIVITAMIN) tablet Take 1 tablet by mouth daily.      [name] omeprazole (PRILOSEC) 20 MG capsule Take 20 mg by mouth daily.        . pravastatin (PRAVACHOL) 20 MG tablet Take  20 mg by mouth daily.       . Tamsulosin HCl (FLOMAX) 0.4 MG CAPS Take 0.4 mg by mouth daily after supper.       Review of Systems  Constitutional: Negative.   HENT: Negative.   Eyes: Negative.   Respiratory: Negative.   Cardiovascular: Negative.   Gastrointestinal: Negative.   Musculoskeletal: Positive for gait problem.  Skin: Negative.   Neurological: Negative.   Hematological: Negative.   Psychiatric/Behavioral: Negative.   All other systems reviewed and are negative.    BP 130/80  Pulse 76  Ht 6\' 1"  (1.854 m)  Wt 244 lb 12 oz (111.018 kg)  BMI 32.29 kg/m2  Physical Exam  Nursing note and vitals reviewed. Constitutional: He is oriented to person, place, and time. He appears well-developed and well-nourished.  HENT:  Head: Normocephalic.  Nose: Nose normal.  Mouth/Throat: Oropharynx is clear and moist.  Eyes: Conjunctivae normal are normal. Pupils are equal, round, and reactive to light.  Neck: Normal range of motion. Neck supple. No JVD present.  Cardiovascular: Normal rate, regular rhythm, S1 normal, S2 normal, normal heart sounds and intact distal pulses.  Exam reveals no gallop and no friction rub.   No murmur heard. Pulmonary/Chest: Effort normal and breath sounds normal. No respiratory distress. He has no wheezes. He has no rales. He exhibits no tenderness.  Abdominal: Soft.  Bowel sounds are normal. He exhibits no distension. There is no tenderness.  Musculoskeletal: Normal range of motion. He exhibits no edema and no tenderness.  Lymphadenopathy:    He has no cervical adenopathy.  Neurological: He is alert and oriented to person, place, and time. Coordination normal.  Skin: Skin is warm and dry. No rash noted. No erythema.  Psychiatric: He has a normal mood and affect. His behavior is normal. Judgment and thought content normal.           Assessment and Plan

## 2012-04-27 NOTE — Assessment & Plan Note (Signed)
Creatinine has improved to 1.3 without Lasix or lisinopril. We'll continue to hold these for now. He is reluctant to have followup with renal given renal improvement.

## 2012-04-27 NOTE — Assessment & Plan Note (Signed)
Blood pressure is well controlled on today's visit. No changes made to the medications. 

## 2012-04-27 NOTE — Assessment & Plan Note (Signed)
No signs of heart failure on today's visit. We will continue to hold the ACE inhibitor and diuretic.

## 2012-04-27 NOTE — Patient Instructions (Addendum)
You are doing well. No medication changes were made.  Please call us if you have new issues that need to be addressed before your next appt.  Your physician wants you to follow-up in: 6 months.  You will receive a reminder letter in the mail two months in advance. If you don't receive a letter, please call our office to schedule the follow-up appointment.   

## 2012-05-02 DIAGNOSIS — N189 Chronic kidney disease, unspecified: Secondary | ICD-10-CM

## 2012-05-02 DIAGNOSIS — I5032 Chronic diastolic (congestive) heart failure: Secondary | ICD-10-CM

## 2012-05-02 DIAGNOSIS — I959 Hypotension, unspecified: Secondary | ICD-10-CM

## 2012-05-02 DIAGNOSIS — I509 Heart failure, unspecified: Secondary | ICD-10-CM

## 2012-05-02 DIAGNOSIS — R0602 Shortness of breath: Secondary | ICD-10-CM

## 2012-05-02 DIAGNOSIS — I25810 Atherosclerosis of coronary artery bypass graft(s) without angina pectoris: Secondary | ICD-10-CM

## 2012-05-02 DIAGNOSIS — N179 Acute kidney failure, unspecified: Secondary | ICD-10-CM

## 2012-05-08 ENCOUNTER — Encounter: Payer: Self-pay | Admitting: Internal Medicine

## 2012-05-08 ENCOUNTER — Ambulatory Visit (INDEPENDENT_AMBULATORY_CARE_PROVIDER_SITE_OTHER): Payer: Medicare Other | Admitting: Internal Medicine

## 2012-05-08 VITALS — BP 92/50 | HR 100 | Ht 73.0 in | Wt 245.5 lb

## 2012-05-08 DIAGNOSIS — I10 Essential (primary) hypertension: Secondary | ICD-10-CM

## 2012-05-08 DIAGNOSIS — I4891 Unspecified atrial fibrillation: Secondary | ICD-10-CM

## 2012-05-08 DIAGNOSIS — Z950 Presence of cardiac pacemaker: Secondary | ICD-10-CM

## 2012-05-08 LAB — PACEMAKER DEVICE OBSERVATION
BMOD-0003RV: 30
BMOD-0005RV: 95 {beats}/min
BRDY-0002RV: 60 {beats}/min
RV LEAD AMPLITUDE: 165.68 mv

## 2012-05-08 MED ORDER — METOPROLOL SUCCINATE ER 100 MG PO TB24: 50.0000 mg | ORAL_TABLET | Freq: Every day | ORAL | Status: DC

## 2012-05-08 NOTE — Assessment & Plan Note (Signed)
His ventricular rate appears to be well-controlled. He has backup pacing with his single chamber pacemaker. He will continue his current regimen of anticoagulation.

## 2012-05-08 NOTE — Patient Instructions (Addendum)
Your physician has recommended you make the following change in your medication:  -decrease Toprol to 50 mg daily   Your physician wants you to follow-up in: 1 year with Dr. Ladona Ridgel. You will receive a reminder letter in the mail two months in advance. If you don't receive a letter, please call our office to schedule the follow-up appointment.

## 2012-05-08 NOTE — Assessment & Plan Note (Signed)
His pacemaker is working normally. He is a single chamber Medtronic device. We'll recheck in several months.

## 2012-05-08 NOTE — Progress Notes (Signed)
HPI Mr. Pilley returns today for followup. He is a 79 year old man with chronic atrial fibrillation, symptomatic bradycardia, status post permanent pacemaker insertion, morbid obesity, and recent hospitalization for acute renal failure. His kidney function has normalized. His blood pressure has run low. He denies chest pain or shortness of breath or syncope. He does have occasional dizzy episodes particularly when he stands up. Allergies  Allergen Reactions  . Atorvastatin      Current Outpatient Prescriptions  Medication Sig Dispense Refill  . aspirin 81 MG EC tablet Take 162 mg by mouth daily.       . dabigatran (PRADAXA) 150 MG CAPS Take 1 capsule (150 mg total) by mouth every 12 (twelve) hours.  60 capsule  6  . diltiazem (DILACOR XR) 180 MG 24 hr capsule Take 180 mg by mouth daily.        . metoprolol succinate (TOPROL-XL) 100 MG 24 hr tablet Take 1 tablet (100 mg total) by mouth daily. Take with or immediately following a meal.  30 tablet  3  . Multiple Vitamin (MULTIVITAMIN) tablet Take 1 tablet by mouth daily.      [name] omeprazole (PRILOSEC) 20 MG capsule Take 20 mg by mouth daily.        . pravastatin (PRAVACHOL) 20 MG tablet Take 20 mg by mouth daily.       . Tamsulosin HCl (FLOMAX) 0.4 MG CAPS Take 0.4 mg by mouth daily after supper.         Past Medical History  Diagnosis Date  . Chronic atrial fibrillation     a. on pradaxa  . Coronary artery disease     a. s/p MI x 3;  b. s/p CABG in 1999;  c. Cath 2007: LIMA->LAD patent, VG->OM 100, PCI/DES of RPL/mid RCA (cypher DES).  . Chronic obstructive pulmonary disease   . GERD (gastroesophageal reflux disease)   . Hyperlipidemia   . Hypertension   . PVD (peripheral vascular disease)   . AAA (abdominal aortic aneurysm)     a. s/p repair  . Chronic diastolic CHF (congestive heart failure)     a. 01/2012 Echo:  EF 55%.  . Tachy-brady syndrome     a. s/p PPM 2010.  [name] CKD (chronic kidney disease), stage III     a. Acute on  chronic 03/2012  . Cancer     skin cancer nose    ROS:   All systems reviewed and negative except as noted in the HPI.   Past Surgical History  Procedure Date  . Coronary artery bypass graft   . Abdominal aortic aneurysm repair   . Insert / replace / remove pacemaker   . Foot surgery     left foot surgery to remove cyst.  . Trigger finger release   . Skin cancer biopsy     nose     Family History  Problem Relation Age of Onset  . Stomach cancer Mother     died @ 81  . Diabetes Mother   . Hypertension Mother   . Coronary artery disease Mother   . Cerebral aneurysm Father     died @ 24  . Diabetes Father   . Hypertension Father   . Coronary artery disease Father      History   Social History  . Marital Status: Married    Spouse Name: N/A    Number of Children: N/A  . Years of Education: N/A   Occupational History  . Not on file.  Social History Main Topics  . Smoking status: Former Smoker -- 1.0 packs/day for 20 years    Quit date: 04/11/1997  . Smokeless tobacco: Never Used  . Alcohol Use: No     Comment: Rare  . Drug Use: No  . Sexually Active: Not on file   Other Topics Concern  . Not on file   Social History Narrative   Lives locally with his wife.  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       BP 92/50  Pulse 100  Ht 6\' 1"  (1.854 m)  Wt 245 lb 8 oz (111.358 kg)  BMI 32.39 kg/m2  Physical Exam:  Well appearing obese, 79 year old man, NAD HEENT: Unremarkable Neck:  6 cm JVD, no thyromegally Lungs:  Clear with no wheezes, rales, or rhonchi. HEART:  IRegular rate rhythm, no murmurs, no rubs, no clicks Abd:  soft, obese, positive bowel sounds, no organomegally, no rebound, no guarding Ext:  2 plus pulses, trace edema, no cyanosis, no clubbing Skin:  No rashes no nodules Neuro:  CN II through XII intact, motor grossly intact  DEVICE  Normal device function.  See PaceArt for details.   Assess/Plan:

## 2012-05-08 NOTE — Assessment & Plan Note (Signed)
His blood pressure is actually low today. I recommended that he reduce his dose of Toprol from 100 mg a day to 50 mg a day.

## 2012-06-06 ENCOUNTER — Other Ambulatory Visit: Payer: Self-pay

## 2012-06-06 ENCOUNTER — Telehealth: Payer: Self-pay | Admitting: *Deleted

## 2012-06-06 MED ORDER — METOPROLOL SUCCINATE ER 100 MG PO TB24: ORAL_TABLET | ORAL | Status: DC

## 2012-06-06 NOTE — Telephone Encounter (Signed)
Pt was taking metoprol tartrate 50mg  and at last visit with Ladona Ridgel he sent in succinate for pt. COpay went from $0.00 for 90 days to $35 and month. Is there a way he can change back or change to a different med. Send to [facility].

## 2012-06-06 NOTE — Telephone Encounter (Signed)
pts wife says they bottle they have been using says "metoprolol ER" and are 100 mg tablets. She has been cutting these in half.  (50 mg ) I explained the Toprol 50 mg we sent in was the same.  She says we sent in 100 mg tablets I advised her to cut these in half Understanding verb She will check with different pharmacies to compare prices

## 2012-06-21 ENCOUNTER — Telehealth: Payer: Self-pay | Admitting: *Deleted

## 2012-06-21 NOTE — Telephone Encounter (Signed)
Will forward to Dr. Mariah Milling to make sure he has no problem with switch. If ok to make a change- need orders for dosing.

## 2012-06-21 NOTE — Telephone Encounter (Signed)
The ones that he has listed are dosed three to four times a day for the same amount of medication as they are short acting, 6 hours. Does he want to take a pill three to four times a day Maybe he can call insurance company back and ask for other alternatives that he does not have to take so frequently?

## 2012-06-21 NOTE — Telephone Encounter (Signed)
Pt received letter from insurance company wanting him to switch his Dilt-XR 180 mg to either diltiazem HCL or Verapamil HCL. Co-pay for the Dilt-XR went from $3.00 month to $45.00 the others will be $6.00 and wants to know if ok to change to either of these meds. Pt uses [facility] now. Please call pt when this is done. Pt only has couple days of medication left

## 2012-06-22 NOTE — Telephone Encounter (Signed)
lmtcb

## 2012-06-22 NOTE — Telephone Encounter (Signed)
Pt ok with these doses Please dose

## 2012-06-24 NOTE — Telephone Encounter (Signed)
Perhaps he could change to  Diltiazem 60 mg,  take TID for total of 180 mg daily Give generic short acting  dose

## 2012-06-25 ENCOUNTER — Other Ambulatory Visit: Payer: Self-pay

## 2012-06-25 MED ORDER — DILTIAZEM HCL 60 MG PO TABS: 60.0000 mg | ORAL_TABLET | Freq: Three times a day (TID) | ORAL | Status: DC

## 2012-06-25 NOTE — Telephone Encounter (Signed)
Pt informed Understanding verb RX sent to pharm 

## 2012-08-06 ENCOUNTER — Ambulatory Visit (INDEPENDENT_AMBULATORY_CARE_PROVIDER_SITE_OTHER): Payer: Medicare Other | Admitting: *Deleted

## 2012-08-06 ENCOUNTER — Encounter: Payer: Self-pay | Admitting: Internal Medicine

## 2012-08-06 ENCOUNTER — Other Ambulatory Visit: Payer: Self-pay | Admitting: Internal Medicine

## 2012-08-06 DIAGNOSIS — Z950 Presence of cardiac pacemaker: Secondary | ICD-10-CM

## 2012-08-06 DIAGNOSIS — I4891 Unspecified atrial fibrillation: Secondary | ICD-10-CM

## 2012-08-14 LAB — REMOTE PACEMAKER DEVICE
BATTERY VOLTAGE: 2.77 v
BMOD-0003RV: 30
BMOD-0005RV: 95 {beats}/min
BRDY-0002RV: 60 {beats}/min
BRDY-0004RV: 130 {beats}/min
RV LEAD AMPLITUDE: 22.4 mv
RV LEAD IMPEDENCE PM: 459 Ohm
RV LEAD THRESHOLD: 0.875 v
VENTRICULAR PACING PM: 77

## 2012-08-30 ENCOUNTER — Encounter: Payer: Self-pay | Admitting: *Deleted

## 2012-09-27 ENCOUNTER — Other Ambulatory Visit: Payer: Self-pay | Admitting: *Deleted

## 2012-09-27 MED ORDER — DILTIAZEM HCL 60 MG PO TABS: 60.0000 mg | ORAL_TABLET | Freq: Three times a day (TID) | ORAL | Status: DC

## 2012-09-27 NOTE — Telephone Encounter (Signed)
Refilled Diltiazem sent to [facility].

## 2012-10-24 ENCOUNTER — Ambulatory Visit (INDEPENDENT_AMBULATORY_CARE_PROVIDER_SITE_OTHER): Payer: Medicare Other | Admitting: Cardiovascular Disease

## 2012-10-24 ENCOUNTER — Encounter: Payer: Self-pay | Admitting: Cardiovascular Disease

## 2012-10-24 VITALS — BP 130/82 | HR 69 | Ht 73.0 in | Wt 245.5 lb

## 2012-10-24 DIAGNOSIS — I5031 Acute diastolic (congestive) heart failure: Secondary | ICD-10-CM

## 2012-10-24 DIAGNOSIS — I25810 Atherosclerosis of coronary artery bypass graft(s) without angina pectoris: Secondary | ICD-10-CM

## 2012-10-24 DIAGNOSIS — I509 Heart failure, unspecified: Secondary | ICD-10-CM

## 2012-10-24 DIAGNOSIS — Z950 Presence of cardiac pacemaker: Secondary | ICD-10-CM

## 2012-10-24 DIAGNOSIS — I4891 Unspecified atrial fibrillation: Secondary | ICD-10-CM

## 2012-10-24 DIAGNOSIS — E785 Hyperlipidemia, unspecified: Secondary | ICD-10-CM

## 2012-10-24 DIAGNOSIS — R0602 Shortness of breath: Secondary | ICD-10-CM

## 2012-10-24 NOTE — Assessment & Plan Note (Signed)
Followed by Dr. Ladona Ridgel. Permanent atrial fibrillation, paced.

## 2012-10-24 NOTE — Assessment & Plan Note (Signed)
Currently with no symptoms of angina. No further workup at this time. Continue current medication regimen. 

## 2012-10-24 NOTE — Assessment & Plan Note (Signed)
Recent lab work shows creatinine 1.7

## 2012-10-24 NOTE — Progress Notes (Signed)
Patient ID: [name], male    DOB: 02/28/1934, 79 y.o.   MRN: [id number]  HPI Comments: 79 year old gentleman with a history of coronary artery disease, bypass surgery in 1999 with catheterization in 2007 with patent LIMA to the LAD, occluded vein graft to the OM, history of Cypher stent to the PL branch and mid RCA in September 07, ejection fraction 45-50%, chronic atrial fibrillation with Medtronic pacemaker, hx of previous recurrent falls and injury from alcohol abuse (stopped drinking more recently), Pacemaker was placed by [facility] heart and vascular in 2010 for syncope, AFib with pauses. He presents for routine followup.    Previous hospitalization for malaise, dehydration, creatinine greater than 3. Lisinopril and Lasix was discontinued.  followup post hospital visit with [name] in our clinic and he was doing relatively well. No medication changes made.  In followup today, he is doing well. He stopped his statin as he reported having joint ache and muscle ache. He has been off the cholesterol medication for at least 2-3 weeks. Denies any other significant symptoms. He reports that his balance is poor. He reports that he has a cane but uses it only sometimes.  Recent lab work from 10/23/2012 shows total cholesterol 212, LDL 142, HDL 50, creatinine 1.7, BUN 22 He is status post right knee replacement.   EKG shows ventricularly paced rhythm at 69 beats per minute, suspect underlying atrial fibrillation      Past Medical History   .  Chronic atrial fibrillation         a. on pradaxa   .  Coronary artery disease         a. s/p MI x 3;  b. s/p CABG in 1999;  c. Cath 2007: LIMA->LAD patent, VG->OM 100, PCI/DES of RPL/mid RCA (cypher DES).   .  Chronic obstructive pulmonary disease     .  GERD (gastroesophageal reflux disease)     .  Hyperlipidemia     .  Hypertension     .  PVD (peripheral vascular disease)     .  AAA (abdominal aortic aneurysm)         a. s/p repair   .   Chronic diastolic CHF (congestive heart failure)         a. 01/2012 Echo:  EF 55%.   .  Tachy-brady syndrome         a. s/p PPM 2010.   [name]  CKD (chronic kidney disease), stage III         a. Acute on chronic 03/2012   .  Cancer         skin cancer nose      Outpatient Encounter Prescriptions as of 10/24/2012  Medication Sig Dispense Refill  . aspirin 81 MG EC tablet Take 162 mg by mouth daily.       . dabigatran (PRADAXA) 150 MG CAPS Take 1 capsule (150 mg total) by mouth every 12 (twelve) hours.  60 capsule  6  . diltiazem (CARDIZEM) 60 MG tablet Take 1 tablet (60 mg total) by mouth 3 (three) times daily.  90 tablet  3  . metoprolol succinate (TOPROL-XL) 100 MG 24 hr tablet Take 1/2 tablet daily  30 tablet  3  . Multiple Vitamin (MULTIVITAMIN) tablet Take 1 tablet by mouth daily.      [name] omeprazole (PRILOSEC) 20 MG capsule Take 20 mg by mouth daily.        . Tamsulosin HCl (FLOMAX) 0.4 MG CAPS Take 0.4  mg by mouth daily after supper.       Review of Systems  Constitutional: Negative.   HENT: Negative.   Eyes: Negative.   Respiratory: Negative.   Cardiovascular: Negative.   Gastrointestinal: Negative.   Musculoskeletal: Positive for gait problem.  Skin: Negative.   Neurological: Negative.   Psychiatric/Behavioral: Negative.   All other systems reviewed and are negative.    BP 130/82  Pulse 69  Ht 6\' 1"  (1.854 m)  Wt 245 lb 8 oz (111.358 kg)  BMI 32.4 kg/m2  Physical Exam  Nursing note and vitals reviewed. Constitutional: He is oriented to person, place, and time. He appears well-developed and well-nourished.  HENT:  Head: Normocephalic.  Nose: Nose normal.  Mouth/Throat: Oropharynx is clear and moist.  Eyes: Conjunctivae are normal. Pupils are equal, round, and reactive to light.  Neck: Normal range of motion. Neck supple. No JVD present.  Cardiovascular: Normal rate, regular rhythm, S1 normal, S2 normal, normal heart sounds and intact distal pulses.  Exam reveals no  gallop and no friction rub.   No murmur heard. Pulmonary/Chest: Effort normal and breath sounds normal. No respiratory distress. He has no wheezes. He has no rales. He exhibits no tenderness.  Abdominal: Soft. Bowel sounds are normal. He exhibits no distension. There is no tenderness.  Musculoskeletal: Normal range of motion. He exhibits no edema and no tenderness.  Lymphadenopathy:    He has no cervical adenopathy.  Neurological: He is alert and oriented to person, place, and time. Coordination normal.  Skin: Skin is warm and dry. No rash noted. No erythema.  Psychiatric: He has a normal mood and affect. His behavior is normal. Judgment and thought content normal.      Assessment and Plan

## 2012-10-24 NOTE — Assessment & Plan Note (Signed)
Beta blocker dose was decreased by Dr. Ladona Ridgel. He denies any further dizziness. Rate is still well controlled

## 2012-10-24 NOTE — Patient Instructions (Addendum)
You are doing well.  Cholesterol is high (greater than 200) We want 150 or less Please start crestor 5 mg every other day If you have no joint or muscle ache after a few weeks, increase crestor to 5 mg daily  Please call us if you have new issues that need to be addressed before your next appt.  Your physician wants you to follow-up in: 6 months.  You will receive a reminder letter in the mail two months in advance. If you don't receive a letter, please call our office to schedule the follow-up appointment.

## 2012-10-24 NOTE — Assessment & Plan Note (Signed)
No significant shortness of breath. Appears relatively euvolemic.

## 2012-10-24 NOTE — Assessment & Plan Note (Signed)
Unable to tolerate pravastatin. He is willing to try low-dose Crestor. We will try 5 mg every other day. We'll try to slowly increase to 5 mg daily if tolerated. Samples given today.

## 2012-11-12 ENCOUNTER — Encounter: Payer: Medicare Other | Admitting: *Deleted

## 2012-11-15 ENCOUNTER — Telehealth: Payer: Self-pay | Admitting: *Deleted

## 2012-11-15 NOTE — Telephone Encounter (Signed)
Patient came by for samples of : Crestor 5mg  #14 [facility] [id number] ex 03/2015 Pradaxa 150mg  #24 [id number] ex 6/15  Given to patient.

## 2012-11-19 ENCOUNTER — Encounter: Payer: Self-pay | Admitting: *Deleted

## 2012-11-22 ENCOUNTER — Telehealth: Payer: Self-pay | Admitting: Internal Medicine

## 2012-11-22 NOTE — Telephone Encounter (Signed)
Patient to resend transmission later today.

## 2012-11-22 NOTE — Telephone Encounter (Signed)
New Prob     Pt missed his remote check and states he was told to call in.

## 2012-12-24 ENCOUNTER — Telehealth: Payer: Self-pay

## 2012-12-24 NOTE — Telephone Encounter (Signed)
Pt would like samples for Pradaxa 150 and Crestor 5 mg. Please call.

## 2012-12-24 NOTE — Telephone Encounter (Signed)
Currently out of pradaxa 150 mg but pt is aware that I have placed samples of crestor 5 mg up front for pick up.

## 2013-02-03 ENCOUNTER — Emergency Department: Payer: Self-pay | Admitting: Emergency Medicine

## 2013-02-03 LAB — COMPREHENSIVE METABOLIC PANEL
Albumin: 3.6 g/dL (ref 3.4–5.0)
Alkaline Phosphatase: 81 U/L (ref 50–136)
Anion Gap: 4 — ABNORMAL LOW (ref 7–16)
BUN: 24 mg/dL — ABNORMAL HIGH (ref 7–18)
Bilirubin,Total: 0.4 mg/dL (ref 0.2–1.0)
Calcium, Total: 9.2 mg/dL (ref 8.5–10.1)
Creatinine: 2.01 mg/dL — ABNORMAL HIGH (ref 0.60–1.30)
EGFR (Non-African Amer.): 31 — ABNORMAL LOW
Osmolality: 280 (ref 275–301)
SGOT(AST): 27 U/L (ref 15–37)
SGPT (ALT): 22 U/L (ref 12–78)
Sodium: 138 mmol/L (ref 136–145)
Total Protein: 7.4 g/dL (ref 6.4–8.2)

## 2013-02-03 LAB — TROPONIN I: Troponin-I: <0.02 ng/mL

## 2013-02-03 LAB — CBC
HCT: 40.9 % (ref 40.0–52.0)
RBC: 4.46 10*6/uL (ref 4.40–5.90)
WBC: 8.1 10*3/uL (ref 3.8–10.6)

## 2013-02-03 LAB — CK TOTAL AND CKMB (NOT AT ARMC)
CK, Total: 113 U/L (ref 35–232)
CK-MB: 1.8 ng/mL (ref 0.5–3.6)

## 2013-02-12 ENCOUNTER — Telehealth: Payer: Self-pay

## 2013-02-12 NOTE — Telephone Encounter (Signed)
Pradaxa 150 mg sample placed up front for pick up.

## 2013-02-12 NOTE — Telephone Encounter (Signed)
Pt wife called, pt requesting Pradaxa samples.

## 2013-03-04 ENCOUNTER — Other Ambulatory Visit: Payer: Self-pay | Admitting: *Deleted

## 2013-03-04 MED ORDER — DILTIAZEM HCL 60 MG PO TABS: 60.0000 mg | ORAL_TABLET | Freq: Three times a day (TID) | ORAL | Status: DC

## 2013-03-04 NOTE — Telephone Encounter (Signed)
Requested Prescriptions   Signed Prescriptions Disp Refills  . diltiazem (CARDIZEM) 60 MG tablet 90 tablet 3    Sig: Take 1 tablet (60 mg total) by mouth 3 (three) times daily.    Authorizing Provider: Antonieta Iba    Ordering User: Kendrick Fries

## 2013-03-05 ENCOUNTER — Other Ambulatory Visit: Payer: Self-pay

## 2013-03-05 MED ORDER — DABIGATRAN ETEXILATE MESYLATE 150 MG PO CAPS: 150.0000 mg | ORAL_CAPSULE | Freq: Two times a day (BID) | ORAL | Status: DC

## 2013-03-05 NOTE — Telephone Encounter (Signed)
Refill sent for Pradaxa 

## 2013-03-11 ENCOUNTER — Encounter: Payer: Self-pay | Admitting: Cardiovascular Disease

## 2013-03-11 ENCOUNTER — Ambulatory Visit (INDEPENDENT_AMBULATORY_CARE_PROVIDER_SITE_OTHER): Payer: Medicare Other | Admitting: Cardiovascular Disease

## 2013-03-11 ENCOUNTER — Telehealth: Payer: Self-pay

## 2013-03-11 VITALS — BP 140/78 | HR 71 | Ht 73.0 in | Wt 251.5 lb

## 2013-03-11 DIAGNOSIS — I4891 Unspecified atrial fibrillation: Secondary | ICD-10-CM

## 2013-03-11 DIAGNOSIS — I5031 Acute diastolic (congestive) heart failure: Secondary | ICD-10-CM

## 2013-03-11 DIAGNOSIS — E785 Hyperlipidemia, unspecified: Secondary | ICD-10-CM

## 2013-03-11 DIAGNOSIS — I25810 Atherosclerosis of coronary artery bypass graft(s) without angina pectoris: Secondary | ICD-10-CM

## 2013-03-11 DIAGNOSIS — I10 Essential (primary) hypertension: Secondary | ICD-10-CM

## 2013-03-11 DIAGNOSIS — I509 Heart failure, unspecified: Secondary | ICD-10-CM

## 2013-03-11 DIAGNOSIS — R0602 Shortness of breath: Secondary | ICD-10-CM

## 2013-03-11 DIAGNOSIS — R0789 Other chest pain: Secondary | ICD-10-CM

## 2013-03-11 MED ORDER — POTASSIUM CHLORIDE ER 10 MEQ PO TBCR: 10.0000 meq | EXTENDED_RELEASE_TABLET | Freq: Two times a day (BID) | ORAL | Status: DC | PRN

## 2013-03-11 MED ORDER — FUROSEMIDE 20 MG PO TABS: 20.0000 mg | ORAL_TABLET | Freq: Two times a day (BID) | ORAL | Status: DC | PRN

## 2013-03-11 NOTE — Progress Notes (Signed)
Patient ID: [name], male    DOB: [birth year], 79 y.o.   MRN: [id number]  HPI Comments: 79 year old gentleman with a history of coronary artery disease, bypass surgery in 1999 with catheterization in 2007 with patent LIMA to the LAD, occluded vein graft to the OM, history of Cypher stent to the PL branch and mid RCA in September 07, ejection fraction 45-50%, chronic atrial fibrillation with Medtronic pacemaker, hx of previous recurrent falls and injury from alcohol abuse (stopped drinking more recently), Pacemaker was placed by [facility] heart and vascular in 2010 for syncope, AFib with pauses. He presents for routine followup.  Previous hospitalization for malaise, dehydration, creatinine greater than 3. Lisinopril and Lasix was discontinued.   In followup today, he reports recent weight gain, leg edema, abdominal swelling, coughing when supine with shortness of breath with exertion. Reports at least a 7 pound weight gain. He has not been taking Lasix though did take one yesterday. His sleep for. He attributed this to his CPAP which does not seem to be working well. He does admit to eating ham recently. Drinking more fluids.  Previous lab work from 10/23/2012 shows total cholesterol 212, LDL 142, HDL 50, creatinine 1.7, BUN 22 He is status post right knee replacement.   EKG shows atrial fibrillation , ventricularly paced rhythm at 71 beats per minute   Past Medical History   .  Chronic atrial fibrillation         a. on pradaxa   .  Coronary artery disease         a. s/p MI x 3;  b. s/p CABG in 1999;  c. Cath 2007: LIMA->LAD patent, VG->OM 100, PCI/DES of RPL/mid RCA (cypher DES).   .  Chronic obstructive pulmonary disease     .  GERD (gastroesophageal reflux disease)     .  Hyperlipidemia     .  Hypertension     .  PVD (peripheral vascular disease)     .  AAA (abdominal aortic aneurysm)         a. s/p repair   .  Chronic diastolic CHF (congestive heart failure)         a.  01/2012 Echo:  EF 55%.   .  Tachy-brady syndrome         a. s/p PPM 2010.   [name]  CKD (chronic kidney disease), stage III         a. Acute on chronic 03/2012   .  Cancer         skin cancer nose      Outpatient Encounter Prescriptions as of 03/11/2013  Medication Sig  . aspirin 81 MG EC tablet Take 162 mg by mouth daily.   . dabigatran (PRADAXA) 150 MG CAPS capsule Take 1 capsule (150 mg total) by mouth every 12 (twelve) hours.  [name] diltiazem (CARDIZEM) 60 MG tablet Take 1 tablet (60 mg total) by mouth 3 (three) times daily.  . metoprolol succinate (TOPROL-XL) 100 MG 24 hr tablet Take 1/2 tablet daily  . Multiple Vitamin (MULTIVITAMIN) tablet Take 1 tablet by mouth daily.  . NON FORMULARY CPAP  . omeprazole (PRILOSEC) 20 MG capsule Take 20 mg by mouth daily.    . rosuvastatin (CRESTOR) 5 MG tablet Take 5 mg by mouth daily.  . Tamsulosin HCl (FLOMAX) 0.4 MG CAPS Take 0.4 mg by mouth daily after supper.  . furosemide (LASIX) 20 MG tablet Take 1 tablet (20 mg total) by mouth 2 (two) times daily  as needed.  . potassium chloride (K-DUR) 10 MEQ tablet Take 1 tablet (10 mEq total) by mouth 2 (two) times daily as needed.    Review of Systems  Constitutional: Negative.   HENT: Negative.   Eyes: Negative.   Respiratory: Negative.   Cardiovascular: Negative.   Gastrointestinal: Negative.   Musculoskeletal: Positive for gait problem.  Skin: Negative.   Neurological: Negative.   Psychiatric/Behavioral: Negative.   All other systems reviewed and are negative.    BP 140/78  Pulse 71  Ht 6\' 1"  (1.854 m)  Wt 251 lb 8 oz (114.08 kg)  BMI 33.19 kg/m2  Physical Exam  Nursing note and vitals reviewed. Constitutional: He is oriented to person, place, and time. He appears well-developed and well-nourished.  HENT:  Head: Normocephalic.  Nose: Nose normal.  Mouth/Throat: Oropharynx is clear and moist.  Eyes: Conjunctivae are normal. Pupils are equal, round, and reactive to light.  Neck: Normal  range of motion. Neck supple. No JVD present.  Cardiovascular: Normal rate, regular rhythm, S1 normal, S2 normal, normal heart sounds and intact distal pulses.  Exam reveals no gallop and no friction rub.   No murmur heard. 1+ pitting edema to the thighs  Pulmonary/Chest: Effort normal. No respiratory distress. He has no wheezes. He has rales. He exhibits no tenderness.  Abdominal: Soft. Bowel sounds are normal. He exhibits no distension. There is no tenderness.  Musculoskeletal: Normal range of motion. He exhibits edema. He exhibits no tenderness.  Lymphadenopathy:    He has no cervical adenopathy.  Neurological: He is alert and oriented to person, place, and time. Coordination normal.  Skin: Skin is warm and dry. No rash noted. No erythema.  Psychiatric: He has a normal mood and affect. His behavior is normal. Judgment and thought content normal.      Assessment and Plan

## 2013-03-11 NOTE — Assessment & Plan Note (Signed)
Rate is reasonably well controlled. He is on anticoagulation

## 2013-03-11 NOTE — Telephone Encounter (Signed)
Pt wife called and wants to be seen today, states his feet have been swollen all weekend. Please call.

## 2013-03-11 NOTE — Telephone Encounter (Signed)
Pt wife called and wants to be seen today, states his feet have been swollen all weekend. Please call.  

## 2013-03-11 NOTE — Assessment & Plan Note (Signed)
We'll try to obtain the most recent lipid panel for our records. Previously was well controlled

## 2013-03-11 NOTE — Patient Instructions (Signed)
Please take lasix daily for leg swelling every day Take with potassium  Once the leg swelling has resolved, take as needed  If shortness of breath does not improve, Take lasix twice a day (Am and after lunch) With potassium twice a day  Please call us if you have new issues that need to be addressed before your next appt.  Your physician wants you to follow-up in: 3 months.  You will receive a reminder letter in the mail two months in advance. If you don't receive a letter, please call our office to schedule the follow-up appointment.

## 2013-03-11 NOTE — Telephone Encounter (Signed)
Spoke w/ pt's wife.  Reports that pt's feet are swelling and she would like for pt to be seen today. Offered her appt w/ Dr. Mariah Milling at 11:00, but she states that pt has appt w/ [facility] at the same time. She will call to resched that appt to see Dr. Mariah Milling at 11:00.

## 2013-03-11 NOTE — Assessment & Plan Note (Signed)
Currently with no symptoms of angina. No further workup at this time. Continue current medication regimen. If symptoms of heart failure do not improve, may need ischemia workup.

## 2013-03-11 NOTE — Assessment & Plan Note (Addendum)
Episode of acute on chronic diastolic CHF likely from dietary indiscretion.  Encouraged him to start taking Lasix daily with potassium 10 mEq. If no significant improvement in his weight and symptoms, will take Lasix twice a day with potassium twice a day until leg edema has improved then take Lasix as needed.

## 2013-03-11 NOTE — Assessment & Plan Note (Signed)
Blood pressure is well controlled on today's visit. No changes made to the medications. 

## 2013-03-14 ENCOUNTER — Telehealth: Payer: Self-pay

## 2013-03-14 NOTE — Telephone Encounter (Signed)
Pt son called states Lasix is not working, states DR Mariah Milling said he would prescribe something different if these did not work. Please call. States he is also having trouble breathing.

## 2013-03-15 ENCOUNTER — Telehealth: Payer: Self-pay | Admitting: *Deleted

## 2013-03-15 NOTE — Telephone Encounter (Signed)
Spoke with patient, wife and son. Pt is having episodes of sob. He feels the lasix is not working and he has even tried to double his dose a few times. He reports swollen ankles. Denies chest pain. He admits that he is not strict about his low sodium diet. He has burgers and other high sodium foods. He states he is currently in no distress but says Dr. Mariah Milling told him at his last appt that there might be another diuretic he could try. Pt was re educated on low sodium diet and advised to follow.

## 2013-03-15 NOTE — Telephone Encounter (Signed)
Left vm for pt and son 12/5

## 2013-04-09 ENCOUNTER — Telehealth: Payer: Self-pay | Admitting: *Deleted

## 2013-04-09 NOTE — Telephone Encounter (Signed)
Placed samples of Pradaxa 150 mg and Crestor 5 mg upfront for pick up.

## 2013-04-09 NOTE — Telephone Encounter (Signed)
Patient called and needs samples of Pradaxa and Crestor. Call when ready # [phone]

## 2013-04-23 ENCOUNTER — Ambulatory Visit (INDEPENDENT_AMBULATORY_CARE_PROVIDER_SITE_OTHER): Payer: Medicare PPO | Admitting: Cardiovascular Disease

## 2013-04-23 ENCOUNTER — Encounter: Payer: Self-pay | Admitting: Cardiovascular Disease

## 2013-04-23 ENCOUNTER — Telehealth: Payer: Self-pay

## 2013-04-23 VITALS — BP 130/72 | HR 71 | Ht 72.0 in | Wt 250.8 lb

## 2013-04-23 DIAGNOSIS — I4891 Unspecified atrial fibrillation: Secondary | ICD-10-CM

## 2013-04-23 DIAGNOSIS — I509 Heart failure, unspecified: Secondary | ICD-10-CM

## 2013-04-23 DIAGNOSIS — I5031 Acute diastolic (congestive) heart failure: Secondary | ICD-10-CM

## 2013-04-23 DIAGNOSIS — E785 Hyperlipidemia, unspecified: Secondary | ICD-10-CM

## 2013-04-23 DIAGNOSIS — R079 Chest pain, unspecified: Secondary | ICD-10-CM

## 2013-04-23 DIAGNOSIS — I10 Essential (primary) hypertension: Secondary | ICD-10-CM

## 2013-04-23 NOTE — Progress Notes (Signed)
Patient ID: [name], male    DOB: November 07, 1933, 80 y.o.   MRN: [id number]  HPI Comments: 80 year old gentleman with a history of coronary artery disease, bypass surgery in 1999 with catheterization in 2007 with patent LIMA to the LAD, occluded vein graft to the OM, history of Cypher stent to the PL branch and mid RCA in September 07, ejection fraction 45-50%, chronic atrial fibrillation with Medtronic pacemaker, hx of previous recurrent falls and injury from alcohol abuse (stopped drinking more recently), Pacemaker was placed by [facility] vascular in 2010 for syncope, AFib with pauses. He presents for routine followup.  Previous hospitalization for malaise, dehydration, creatinine greater than 3. Lisinopril and Lasix was discontinued.  On his last clinic visit, he reported weight gain, leg edema, abdominal swelling, coughing when supine. He was started on Lasix 20 mg daily.  In followup today he reports that his leg edema is much better, cough has resolved, weight has not changed much but he feels better.   He reports having a positive sleep study, was on CPAP for several months. Recent change to his insurance now has no coverage and we'll need to change supplier of his CPAP.  Previous lab work from 10/23/2012 shows total cholesterol 212, LDL 142, HDL 50, creatinine 1.7, BUN 22 He is status post right knee replacement.   EKG shows atrial fibrillation , ventricularly paced rhythm at 71 beats per minute   Past Medical History   .  Chronic atrial fibrillation         a. on pradaxa   .  Coronary artery disease         a. s/p MI x 3;  b. s/p CABG in 1999;  c. Cath 2007: LIMA->LAD patent, VG->OM 100, PCI/DES of RPL/mid RCA (cypher DES).   .  Chronic obstructive pulmonary disease     .  GERD (gastroesophageal reflux disease)     .  Hyperlipidemia     .  Hypertension     .  PVD (peripheral vascular disease)     .  AAA (abdominal aortic aneurysm)         a. s/p repair   .   Chronic diastolic CHF (congestive heart failure)         a. 01/2012 Echo:  EF 55%.   .  Tachy-brady syndrome         a. s/p PPM 2010.   [name]  CKD (chronic kidney disease), stage III         a. Acute on chronic 03/2012   .  Cancer         skin cancer nose      Outpatient Encounter Prescriptions as of 04/23/2013  Medication Sig  . aspirin 81 MG EC tablet Take 162 mg by mouth daily.   . dabigatran (PRADAXA) 150 MG CAPS capsule Take 1 capsule (150 mg total) by mouth every 12 (twelve) hours.  [name] diltiazem (CARDIZEM) 60 MG tablet Take 1 tablet (60 mg total) by mouth 3 (three) times daily.  . furosemide (LASIX) 20 MG tablet Take 1 tablet (20 mg total) by mouth 2 (two) times daily as needed.  . metoprolol succinate (TOPROL-XL) 100 MG 24 hr tablet Take 1/2 tablet daily  . Multiple Vitamin (MULTIVITAMIN) tablet Take 1 tablet by mouth daily.  . NON FORMULARY CPAP  . omeprazole (PRILOSEC) 20 MG capsule Take 20 mg by mouth daily.    . potassium chloride (K-DUR) 10 MEQ tablet Take 1 tablet (10 mEq  total) by mouth 2 (two) times daily as needed.  . rosuvastatin (CRESTOR) 5 MG tablet Take 5 mg by mouth daily.  . Tamsulosin HCl (FLOMAX) 0.4 MG CAPS Take 0.4 mg by mouth daily after supper.    Review of Systems  Constitutional: Negative.   HENT: Negative.   Eyes: Negative.   Respiratory: Negative.   Cardiovascular: Negative.   Gastrointestinal: Negative.   Musculoskeletal: Positive for gait problem.  Skin: Negative.   Neurological: Negative.   Psychiatric/Behavioral: Negative.   All other systems reviewed and are negative.    BP 130/72  Pulse 71  Ht 6' (1.829 m)  Wt 250 lb 12 oz (113.739 kg)  BMI 34.00 kg/m2  Physical Exam  Nursing note and vitals reviewed. Constitutional: He is oriented to person, place, and time. He appears well-developed and well-nourished.  HENT:  Head: Normocephalic.  Nose: Nose normal.  Mouth/Throat: Oropharynx is clear and moist.  Eyes: Conjunctivae are normal.  Pupils are equal, round, and reactive to light.  Neck: Normal range of motion. Neck supple. No JVD present.  Cardiovascular: Normal rate, regular rhythm, S1 normal, S2 normal, normal heart sounds and intact distal pulses.  Exam reveals no gallop and no friction rub.   No murmur heard. 1+ pitting edema to the thighs  Pulmonary/Chest: Effort normal. No respiratory distress. He has no wheezes. He exhibits no tenderness.  Abdominal: Soft. Bowel sounds are normal. He exhibits no distension. There is no tenderness.  Musculoskeletal: Normal range of motion. He exhibits no edema and no tenderness.  Lymphadenopathy:    He has no cervical adenopathy.  Neurological: He is alert and oriented to person, place, and time. Coordination normal.  Skin: Skin is warm and dry. No rash noted. No erythema.  Psychiatric: He has a normal mood and affect. His behavior is normal. Judgment and thought content normal.      Assessment and Plan

## 2013-04-23 NOTE — Assessment & Plan Note (Signed)
Heart failure symptoms have significantly improved with  Lasix 20 mg daily. we have suggested if he has additional leg swelling, that he take 20 mg Lasix in the afternoon.

## 2013-04-23 NOTE — Patient Instructions (Addendum)
You are doing well. No medication changes were made.  Continue lasix one a day Please take extra lasix as needed for leg swelling  Please call us if you have new issues that need to be addressed before your next appt.  Your physician wants you to follow-up in: 3 months.  You will receive a reminder letter in the mail two months in advance. If you don't receive a letter, please call our office to schedule the follow-up appointment.

## 2013-04-23 NOTE — Telephone Encounter (Signed)
Pt called stating the when he was in the office for visit this am, he was advised that he could use a local company here in [place] for his CPAP and would not need to go thru [place] anymore. Reports that he called and cancelled his CPAP w/ the company that he has been working with that was ordered by Dr. Verneda Skill. Previously had sleep study at [facility] and was told that he would not need another sleep study in order to switch companies.  States that he would prefer to go thru [place], but wants to make sure that ins will cover before he orders. Will inquire about ins and contact pt w/ any further instructions.

## 2013-04-23 NOTE — Assessment & Plan Note (Signed)
Encouraged him to stay on his Crestor 

## 2013-04-23 NOTE — Assessment & Plan Note (Signed)
Blood pressure is well controlled on today's visit. No changes made to the medications. 

## 2013-04-23 NOTE — Assessment & Plan Note (Signed)
Tolerating pradaxa twice a day.

## 2013-04-24 NOTE — Telephone Encounter (Signed)
Spoke w/ pt.  Advised him that I had received message from [facility] [place]:  "Unfortunately, this pt's insurance is the only one we are not in network with for equipment. (We are in network with them for home health however). It's this specific [employer] "Gold Plus" plan that I'm referring to. There are other [employer] plans that have out of network benefits that we can accept.   We are seeing quite a few patients that changed to [facility] after the first of the year so if you get another one, just let me know and I can check their specific plan. Also, for this patient, we know that [employer] has a Landscape architect with this plan so he should be able to transfer to them for his CPAP."  Pt expresses disappointment, as he cancelled his service through [place] yesterday.  He reports that he will contact them to try to reestablish care with them and contact us if he needs any additional info.

## 2013-05-14 ENCOUNTER — Encounter: Payer: Self-pay | Admitting: Internal Medicine

## 2013-05-14 ENCOUNTER — Ambulatory Visit (INDEPENDENT_AMBULATORY_CARE_PROVIDER_SITE_OTHER): Payer: Medicare PPO | Admitting: Internal Medicine

## 2013-05-14 VITALS — BP 158/90 | HR 78 | Ht 73.0 in | Wt 253.5 lb

## 2013-05-14 DIAGNOSIS — I5032 Chronic diastolic (congestive) heart failure: Secondary | ICD-10-CM

## 2013-05-14 DIAGNOSIS — I10 Essential (primary) hypertension: Secondary | ICD-10-CM

## 2013-05-14 DIAGNOSIS — Z950 Presence of cardiac pacemaker: Secondary | ICD-10-CM

## 2013-05-14 DIAGNOSIS — I4891 Unspecified atrial fibrillation: Secondary | ICD-10-CM

## 2013-05-14 LAB — MDC_IDC_ENUM_SESS_TYPE_INCLINIC
Battery Impedance: 1456 Ohm
Battery Remaining Longevity: 49 mo
Brady Statistic RV Percent Paced: 77 %
Lead Channel Impedance Value: 426 Ohm
Lead Channel Setting Pacing Pulse Width: 0.4 ms
Lead Channel Setting Sensing Sensitivity: 5.6 mV
MDC IDC MSMT BATTERY VOLTAGE: 2.76 V
MDC IDC MSMT LEADCHNL RA IMPEDANCE VALUE: 0 Ohm
MDC IDC MSMT LEADCHNL RV PACING THRESHOLD AMPLITUDE: 1 V
MDC IDC MSMT LEADCHNL RV PACING THRESHOLD PULSEWIDTH: 0.4 ms
MDC IDC MSMT LEADCHNL RV SENSING INTR AMPL: 15.67 mV
MDC IDC SESS DTM: 20150203111113
MDC IDC SET LEADCHNL RV PACING AMPLITUDE: 2.5 V

## 2013-05-14 MED ORDER — DILTIAZEM HCL ER COATED BEADS 120 MG PO CP24: 120.0000 mg | ORAL_CAPSULE | Freq: Every day | ORAL | Status: DC

## 2013-05-14 NOTE — Assessment & Plan Note (Signed)
As above.

## 2013-05-14 NOTE — Progress Notes (Signed)
Patient Care Team: [place] Pink, MD as PCP - General (Family Medicine)   HPI  [name] is a 80 y.o. male Seen in followup for a pacemaker implanted for symptomatic bradycardia in the context of permanent atrial fibrillation. He takes dabigitran for anticoagulation.  He also has ischemic heart disease with prior bypass surgery and subsequent percutaneous revascularization. Last ejection fraction was 2013-EF 55%. He also suffers from morbid obesity and sleep apnea for which he tried but was unable to use and has returned his CPAP   He has been struggling with heart failure symptoms her edema is much improved since the up titration of his diuretics.     Past Medical History  Diagnosis Date  . Chronic atrial fibrillation     a. on pradaxa  . Coronary artery disease     a. s/p MI x 3;  b. s/p CABG in 1999;  c. Cath 2007: LIMA->LAD patent, VG->OM 100, PCI/DES of RPL/mid RCA (cypher DES).  . Chronic obstructive pulmonary disease   . GERD (gastroesophageal reflux disease)   . Hyperlipidemia   . Hypertension   . PVD (peripheral vascular disease)   . AAA (abdominal aortic aneurysm)     a. s/p repair  . Chronic diastolic CHF (congestive heart failure)     a. 01/2012 Echo:  EF 55%.  . Tachy-brady syndrome     a. s/p PPM 2010.  [name] CKD (chronic kidney disease), stage III     a. Acute on chronic 03/2012  . Cancer     skin cancer nose  . Sleep apnea     Past Surgical History  Procedure Laterality Date  . Coronary artery bypass graft    . Abdominal aortic aneurysm repair    . Insert / replace / remove pacemaker    . Foot surgery      left foot surgery to remove cyst.  . Trigger finger release    . Skin cancer biopsy      nose    Current Outpatient Prescriptions  Medication Sig Dispense Refill  . aspirin 81 MG EC tablet Take 162 mg by mouth daily.       . dabigatran (PRADAXA) 150 MG CAPS capsule Take 1 capsule (150 mg total) by mouth every 12 (twelve) hours.   60 capsule  6  . diltiazem (CARDIZEM) 60 MG tablet Take 1 tablet (60 mg total) by mouth 3 (three) times daily.  90 tablet  3  . furosemide (LASIX) 20 MG tablet Take 1 tablet (20 mg total) by mouth 2 (two) times daily as needed.  60 tablet  6  . metoprolol succinate (TOPROL-XL) 100 MG 24 hr tablet Take 1/2 tablet daily  30 tablet  3  . omeprazole (PRILOSEC) 20 MG capsule Take 20 mg by mouth daily.        . potassium chloride (K-DUR) 10 MEQ tablet Take 1 tablet (10 mEq total) by mouth 2 (two) times daily as needed.  60 tablet  6  . rosuvastatin (CRESTOR) 5 MG tablet Take 5 mg by mouth daily.      . Tamsulosin HCl (FLOMAX) 0.4 MG CAPS Take 0.4 mg by mouth daily after supper.       No current facility-administered medications for this visit.    Allergies  Allergen Reactions  . Atorvastatin     Review of Systems negative except from HPI and PMH  Physical Exam BP 158/90  Pulse 78  Ht 6\' 1"  (1.854 m)  Wt  253 lb 8 oz (114.987 kg)  BMI 33.45 kg/m2 Well developed and well nourished in no acute distress HENT normal E scleral and icterus clear Neck Supple JVP flat; carotids brisk and full Clear to ausculation  Regular rate and rhythm, no murmurs gallops or rub Soft with active bowel sounds No clubbing cyanosis 1+ and none Edema Alert and oriented, grossly normal motor and sensory function Skin Warm and Dry    Assessment and  Plan

## 2013-05-14 NOTE — Assessment & Plan Note (Signed)
Volume status is much improved. His blood pressure is still elevated. We will increase his diltiazem( 60 3 times a day--240 daily) and check his metabolic profile.

## 2013-05-14 NOTE — Patient Instructions (Addendum)
Your physician has recommended you make the following change in your medication:  Stop Aspirin  Increase Cardizem to 120 mg daily    Your physician recommends that you return for lab work in:  Today Basic metabolic panel   Your physician wants you to follow-up in: 1 year Dr. Caryl Comes. You will receive a reminder letter in the mail two months in advance. If you don't receive a letter, please call our office to schedule the follow-up appointment.  Remote monitoring is used to monitor your Pacemaker of ICD from home. This monitoring reduces the number of office visits required to check your device to one time per year. It allows [place] to keep an eye on the functioning of your device to ensure it is working properly. You are scheduled for a device check from home on 08/15/13. You may send your transmission at any time that day. If you have a wireless device, the transmission will be sent automatically. After your physician reviews your transmission, you will receive a postcard with your next transmission date.

## 2013-05-14 NOTE — Assessment & Plan Note (Signed)
Continue dabigitran; we'll discontinue aspirin

## 2013-05-14 NOTE — Assessment & Plan Note (Signed)
The patient's device was interrogated.  The information was reviewed. No changes were made in the programming.    

## 2013-05-15 LAB — BASIC METABOLIC PANEL
BUN/Creatinine Ratio: 11 (ref 10–22)
BUN: 19 mg/dL (ref 8–27)
CHLORIDE: 100 mmol/L (ref 97–108)
CO2: 21 mmol/L (ref 18–29)
Calcium: 9.7 mg/dL (ref 8.6–10.2)
Creatinine, Ser: 1.8 mg/dL — ABNORMAL HIGH (ref 0.76–1.27)
GFR calc Af Amer: 41 mL/min/{1.73_m2} — ABNORMAL LOW (ref >59)
GFR, EST NON AFRICAN AMERICAN: 36 mL/min/{1.73_m2} — AB (ref >59)
GLUCOSE: 113 mg/dL — AB (ref 65–99)
Potassium: 5.4 mmol/L — ABNORMAL HIGH (ref 3.5–5.2)
Sodium: 144 mmol/L (ref 134–144)

## 2013-05-16 ENCOUNTER — Other Ambulatory Visit: Payer: Self-pay | Admitting: *Deleted

## 2013-05-16 DIAGNOSIS — I5032 Chronic diastolic (congestive) heart failure: Secondary | ICD-10-CM

## 2013-05-27 ENCOUNTER — Ambulatory Visit (INDEPENDENT_AMBULATORY_CARE_PROVIDER_SITE_OTHER): Payer: Commercial Managed Care - HMO | Admitting: *Deleted

## 2013-05-27 DIAGNOSIS — I5032 Chronic diastolic (congestive) heart failure: Secondary | ICD-10-CM

## 2013-05-28 LAB — BASIC METABOLIC PANEL
BUN/Creatinine Ratio: 16 (ref 10–22)
BUN: 31 mg/dL — AB (ref 8–27)
CALCIUM: 9.3 mg/dL (ref 8.6–10.2)
CHLORIDE: 98 mmol/L (ref 97–108)
CO2: 22 mmol/L (ref 18–29)
Creatinine, Ser: 2 mg/dL — ABNORMAL HIGH (ref 0.76–1.27)
GFR calc Af Amer: 36 mL/min/{1.73_m2} — ABNORMAL LOW (ref >59)
GFR, EST NON AFRICAN AMERICAN: 31 mL/min/{1.73_m2} — AB (ref >59)
GLUCOSE: 119 mg/dL — AB (ref 65–99)
POTASSIUM: 4.4 mmol/L (ref 3.5–5.2)
Sodium: 140 mmol/L (ref 134–144)

## 2013-06-03 ENCOUNTER — Other Ambulatory Visit: Payer: Self-pay | Admitting: *Deleted

## 2013-06-03 DIAGNOSIS — I10 Essential (primary) hypertension: Secondary | ICD-10-CM

## 2013-06-03 DIAGNOSIS — I5032 Chronic diastolic (congestive) heart failure: Secondary | ICD-10-CM

## 2013-06-03 MED ORDER — METOPROLOL SUCCINATE ER 100 MG PO TB24: 100.0000 mg | ORAL_TABLET | Freq: Every day | ORAL | Status: DC

## 2013-06-03 MED ORDER — METOPROLOL SUCCINATE ER 100 MG PO TB24: 100.0000 mg | ORAL_TABLET | Freq: Every day | ORAL | Status: AC

## 2013-06-10 ENCOUNTER — Ambulatory Visit (INDEPENDENT_AMBULATORY_CARE_PROVIDER_SITE_OTHER): Payer: Medicare PPO | Admitting: Cardiovascular Disease

## 2013-06-10 ENCOUNTER — Encounter: Payer: Self-pay | Admitting: Cardiovascular Disease

## 2013-06-10 VITALS — BP 130/82 | HR 78 | Ht 73.0 in | Wt 250.0 lb

## 2013-06-10 DIAGNOSIS — I25810 Atherosclerosis of coronary artery bypass graft(s) without angina pectoris: Secondary | ICD-10-CM

## 2013-06-10 DIAGNOSIS — I5032 Chronic diastolic (congestive) heart failure: Secondary | ICD-10-CM

## 2013-06-10 DIAGNOSIS — I10 Essential (primary) hypertension: Secondary | ICD-10-CM

## 2013-06-10 DIAGNOSIS — I4891 Unspecified atrial fibrillation: Secondary | ICD-10-CM

## 2013-06-10 DIAGNOSIS — E785 Hyperlipidemia, unspecified: Secondary | ICD-10-CM

## 2013-06-10 DIAGNOSIS — Z950 Presence of cardiac pacemaker: Secondary | ICD-10-CM

## 2013-06-10 DIAGNOSIS — R0602 Shortness of breath: Secondary | ICD-10-CM

## 2013-06-10 NOTE — Progress Notes (Signed)
Patient ID: [name], male    DOB: [birth year], 80 y.o.   MRN: [id number]  HPI Comments: 80 year old gentleman with a history of coronary artery disease, bypass surgery in 1999 with catheterization in 2007 with patent LIMA to the LAD, occluded vein graft to the OM, history of Cypher stent to the PL branch and mid RCA in September 07, ejection fraction 45-50%, chronic atrial fibrillation with Medtronic pacemaker, hx of previous recurrent falls and injury from alcohol abuse (stopped drinking more recently), Pacemaker was placed by [facility] in 2010 for syncope, AFib with pauses. He presents for routine followup.  Previous hospitalization for malaise, dehydration, creatinine greater than 3. Lisinopril and Lasix was discontinued.  On his last clinic visit, he reported weight gain, leg edema, abdominal swelling, coughing when supine. He was started on Lasix 20 mg daily.  He felt better restarting Lasix daily Today he presents and reports taking Lasix 20 mg twice a day Renal function has been getting worse with a cement about panel 2 weeks ago showing worsening renal function. He denies having any significant leg edema.   History of positive sleep study, was on CPAP for several months. Recent change to his insurance now has no coverage  Previous lab work from 10/23/2012 shows total cholesterol 212, LDL 142, HDL 50, creatinine 1.7, BUN 22 He is status post right knee replacement.   EKG shows atrial fibrillation , ventricularly paced rhythm at 78 beats per minute, ventricular paced beats   Past Medical History   .  Chronic atrial fibrillation         a. on pradaxa   .  Coronary artery disease         a. s/p MI x 3;  b. s/p CABG in 1999;  c. Cath 2007: LIMA->LAD patent, VG->OM 100, PCI/DES of RPL/mid RCA (cypher DES).   .  Chronic obstructive pulmonary disease     .  GERD (gastroesophageal reflux disease)     .  Hyperlipidemia     .  Hypertension     .  PVD  (peripheral vascular disease)     .  AAA (abdominal aortic aneurysm)         a. s/p repair   .  Chronic diastolic CHF (congestive heart failure)         a. 01/2012 Echo:  EF 55%.   .  Tachy-brady syndrome         a. s/p PPM 2010.   [name]  CKD (chronic kidney disease), stage III         a. Acute on chronic 03/2012   .  Cancer         skin cancer nose      Outpatient Encounter Prescriptions as of 06/10/2013  Medication Sig  . dabigatran (PRADAXA) 150 MG CAPS capsule Take 1 capsule (150 mg total) by mouth every 12 (twelve) hours.  . furosemide (LASIX) 20 MG tablet Take 20 mg by mouth 2 (two) times daily.  . metoprolol succinate (TOPROL-XL) 100 MG 24 hr tablet Take 1 tablet (100 mg total) by mouth daily.  [name] omeprazole (PRILOSEC) 20 MG capsule Take 20 mg by mouth daily.    . rosuvastatin (CRESTOR) 5 MG tablet Take 5 mg by mouth daily.  . Tamsulosin HCl (FLOMAX) 0.4 MG CAPS Take 0.4 mg by mouth daily after supper.   Review of Systems  Constitutional: Negative.   HENT: Negative.   Eyes: Negative.   Respiratory: Negative.   Cardiovascular:  Negative.   Gastrointestinal: Negative.   Endocrine: Negative.   Musculoskeletal: Positive for gait problem.  Skin: Negative.   Allergic/Immunologic: Negative.   Neurological: Negative.   Hematological: Negative.   Psychiatric/Behavioral: Negative.   All other systems reviewed and are negative.    BP 130/82  Pulse 78  Ht 6\' 1"  (1.854 m)  Wt 250 lb (113.399 kg)  BMI 32.99 kg/m2  Physical Exam  Nursing note and vitals reviewed. Constitutional: He is oriented to person, place, and time. He appears well-developed and well-nourished.  HENT:  Head: Normocephalic.  Nose: Nose normal.  Mouth/Throat: Oropharynx is clear and moist.  Eyes: Conjunctivae are normal. Pupils are equal, round, and reactive to light.  Neck: Normal range of motion. Neck supple. No JVD present.  Cardiovascular: Normal rate, S1 normal, S2 normal, normal heart sounds and  intact distal pulses.  An irregularly irregular rhythm present. Exam reveals no gallop and no friction rub.   No murmur heard. Pulmonary/Chest: Effort normal. No respiratory distress. He has no wheezes. He exhibits no tenderness.  Abdominal: Soft. Bowel sounds are normal. He exhibits no distension. There is no tenderness.  Musculoskeletal: Normal range of motion. He exhibits no edema and no tenderness.  Lymphadenopathy:    He has no cervical adenopathy.  Neurological: He is alert and oriented to person, place, and time. Coordination normal.  Skin: Skin is warm and dry. No rash noted. No erythema.  Psychiatric: He has a normal mood and affect. His behavior is normal. Judgment and thought content normal.      Assessment and Plan

## 2013-06-10 NOTE — Assessment & Plan Note (Signed)
Recommended he stay on his Crestor

## 2013-06-10 NOTE — Assessment & Plan Note (Signed)
On anticoagulation, rate well controlled

## 2013-06-10 NOTE — Assessment & Plan Note (Signed)
Blood pressure is well controlled on today's visit. No changes made to the medications. 

## 2013-06-10 NOTE — Assessment & Plan Note (Addendum)
Worsening renal function consistent with dehydration. Uncertain why he is taking Lasix 20 mg twice a day. We have recommended:  take lasix one a day for weight >246,  Take lasix twice a day for weight >250

## 2013-06-10 NOTE — Assessment & Plan Note (Signed)
Currently with no symptoms of angina. No further workup at this time. Continue current medication regimen. 

## 2013-06-10 NOTE — Patient Instructions (Signed)
Please take lasix one a day for weight >246 Take lasix twice a day for weight >250  Please call us if you have new issues that need to be addressed before your next appt.  Your physician wants you to follow-up in: 6 months.  You will receive a reminder letter in the mail two months in advance. If you don't receive a letter, please call our office to schedule the follow-up appointment.

## 2013-06-10 NOTE — Assessment & Plan Note (Signed)
Followed by Dr. Klein 

## 2013-06-30 ENCOUNTER — Emergency Department: Payer: Self-pay | Admitting: Emergency Medicine

## 2013-06-30 LAB — CBC
HCT: 40.8 % (ref 40.0–52.0)
HGB: 13.4 g/dL (ref 13.0–18.0)
MCH: 30.7 pg (ref 26.0–34.0)
MCHC: 32.8 g/dL (ref 32.0–36.0)
MCV: 94 fL (ref 80–100)
Platelet: 170 10*3/uL (ref 150–440)
RBC: 4.36 10*6/uL — ABNORMAL LOW (ref 4.40–5.90)
RDW: 15 % — ABNORMAL HIGH (ref 11.5–14.5)
WBC: 8.2 10*3/uL (ref 3.8–10.6)

## 2013-06-30 LAB — BASIC METABOLIC PANEL
Anion Gap: 4 — ABNORMAL LOW (ref 7–16)
BUN: 22 mg/dL — ABNORMAL HIGH (ref 7–18)
CO2: 28 mmol/L (ref 21–32)
Calcium, Total: 9.4 mg/dL (ref 8.5–10.1)
Chloride: 110 mmol/L — ABNORMAL HIGH (ref 98–107)
Creatinine: 1.8 mg/dL — ABNORMAL HIGH (ref 0.60–1.30)
EGFR (African American): 41 — ABNORMAL LOW
EGFR (Non-African Amer.): 36 — ABNORMAL LOW
GLUCOSE: 91 mg/dL (ref 65–99)
Osmolality: 286 (ref 275–301)
POTASSIUM: 4.8 mmol/L (ref 3.5–5.1)
SODIUM: 142 mmol/L (ref 136–145)

## 2013-06-30 LAB — PRO B NATRIURETIC PEPTIDE: B-TYPE NATIURETIC PEPTID: 7505 pg/mL — AB (ref 0–450)

## 2013-06-30 LAB — CK TOTAL AND CKMB (NOT AT ARMC)
CK, Total: 89 U/L
CK-MB: 1.5 ng/mL (ref 0.5–3.6)

## 2013-06-30 LAB — TROPONIN I: Troponin-I: 0.02 ng/mL

## 2013-07-01 ENCOUNTER — Encounter: Payer: Self-pay | Admitting: Cardiovascular Disease

## 2013-07-01 ENCOUNTER — Ambulatory Visit (INDEPENDENT_AMBULATORY_CARE_PROVIDER_SITE_OTHER): Payer: Medicare PPO | Admitting: Cardiovascular Disease

## 2013-07-01 ENCOUNTER — Telehealth: Payer: Self-pay

## 2013-07-01 VITALS — BP 151/96 | HR 86 | Ht 73.0 in | Wt 240.5 lb

## 2013-07-01 DIAGNOSIS — I5032 Chronic diastolic (congestive) heart failure: Secondary | ICD-10-CM

## 2013-07-01 DIAGNOSIS — I10 Essential (primary) hypertension: Secondary | ICD-10-CM

## 2013-07-01 DIAGNOSIS — I25810 Atherosclerosis of coronary artery bypass graft(s) without angina pectoris: Secondary | ICD-10-CM

## 2013-07-01 DIAGNOSIS — I4891 Unspecified atrial fibrillation: Secondary | ICD-10-CM

## 2013-07-01 DIAGNOSIS — R079 Chest pain, unspecified: Secondary | ICD-10-CM

## 2013-07-01 DIAGNOSIS — R0602 Shortness of breath: Secondary | ICD-10-CM

## 2013-07-01 MED ORDER — AMLODIPINE BESYLATE 5 MG PO TABS: 5.0000 mg | ORAL_TABLET | Freq: Every day | ORAL | Status: DC

## 2013-07-01 MED ORDER — FUROSEMIDE 40 MG PO TABS
40.0000 mg | ORAL_TABLET | Freq: Every day | ORAL | Status: DC
Start: 2013-07-01 — End: 2013-07-12

## 2013-07-01 NOTE — Telephone Encounter (Signed)
Pt would like Pradaxa and Crestor samples. He will pick them up next week when he comes back.

## 2013-07-01 NOTE — Assessment & Plan Note (Signed)
The patient seems to be having problems with fluid overload recently with difficult management due to chronic kidney disease. It's not entirely clear if also there is a component related to COPD although he reports no improvement in the past with the use of inhalers. He has not had any recent evaluation of his ejection fraction. Thus, I requested an echocardiogram. Given his chronic kidney disease, the 20 mg dose of Lasix might not be effective. Thus, I changed it to 40 mg once daily. Check basic metabolic profile in one week. Consider renal artery duplex ultrasound given worsening heart failure and chronic kidney disease. Followup with Dr. Rockey Situ after echo.

## 2013-07-01 NOTE — Telephone Encounter (Signed)
Samples of crestor 5 mg and pradaxa 150 mg placed at the front desk for the patient to pick up.

## 2013-07-01 NOTE — Assessment & Plan Note (Signed)
Continue anticoagulation. Consider switching Pradaxa to Eliquis given chronic kidney disease.

## 2013-07-01 NOTE — Assessment & Plan Note (Signed)
Blood pressure has been elevated since diltiazem was discontinued. Thus, I added amlodipine 5 mg once daily.

## 2013-07-01 NOTE — Patient Instructions (Signed)
Your physician has requested that you have an echocardiogram. Echocardiography is a painless test that uses sound waves to create images of your heart. It provides your doctor with information about the size and shape of your heart and how well your heart's chambers and valves are working. This procedure takes approximately one hour. There are no restrictions for this procedure.  Your physician recommends that you return for lab work in:  BMP the same day as your echo.   Your physician has recommended you make the following change in your medication:  Increase lasix to 40 mg daily  Start Amlodipine 5 mg daily   Your physician recommends that you schedule a follow-up appointment in:  With Dr. Rockey Situ after your echo

## 2013-07-01 NOTE — Assessment & Plan Note (Signed)
Consider ischemic cardiac evaluation once volume status is optimized given worsening symptoms of heart failure.

## 2013-07-01 NOTE — Progress Notes (Signed)
Patient ID: [name], male    DOB: 01-Oct-1933, 80 y.o.   MRN: [id number]  HPI Comments: 80 year old gentleman who is a patient of Dr. Rockey Situ. He was added to my schedule after an emergency room visit yesterday. He has a history of coronary artery disease, bypass surgery in 1999 with catheterization in 2007 with patent LIMA to the LAD, occluded vein graft to the OM, history of Cypher stent to the PL branch and mid RCA in September 07, ejection fraction 45-50%, chronic atrial fibrillation with Medtronic pacemaker, hx of previous recurrent falls and injury from alcohol abuse (stopped drinking more recently), Pacemaker was placed by [facility] vascular in 2010 for syncope, AFib with pauses. He presents for routine followup.  Previous hospitalization for malaise, dehydration, creatinine greater than 3. Lisinopril and Lasix was discontinued.  On his last clinic visit, he reported weight gain, leg edema, abdominal swelling, coughing when supine. He was started on Lasix 20 mg daily.  He has been having difficulties with fluid management and worsening kidney function. History of positive sleep study, was on CPAP for several months. Recent change to his insurance now has no coverage  He has been taking Lasix 20 mg once daily or twice daily as needed depending on his weight. Diltiazem was discontinued recently. He went to the emergency room yesterday due to worsening dyspnea and orthopnea. Labs showed creatinine of 1.8, BNP was 7500 and cardiac enzymes were normal. Chest x-ray showed COPD changes with cardiomegaly. He was given Lasix in the emergency room with improvement in symptoms.   Past Medical History   .  Chronic atrial fibrillation         a. on pradaxa   .  Coronary artery disease         a. s/p MI x 3;  b. s/p CABG in 1999;  c. Cath 2007: LIMA->LAD patent, VG->OM 100, PCI/DES of RPL/mid RCA (cypher DES).   .  Chronic obstructive pulmonary disease     .  GERD (gastroesophageal  reflux disease)     .  Hyperlipidemia     .  Hypertension     .  PVD (peripheral vascular disease)     .  AAA (abdominal aortic aneurysm)         a. s/p repair   .  Chronic diastolic CHF (congestive heart failure)         a. 01/2012 Echo:  EF 55%.   .  Tachy-brady syndrome         a. s/p PPM 2010.   [name]  CKD (chronic kidney disease), stage III         a. Acute on chronic 03/2012   .  Cancer         skin cancer nose      Outpatient Encounter Prescriptions as of 06/10/2013  Medication Sig  . dabigatran (PRADAXA) 150 MG CAPS capsule Take 1 capsule (150 mg total) by mouth every 12 (twelve) hours.  . furosemide (LASIX) 20 MG tablet Take 20 mg by mouth 2 (two) times daily.  . metoprolol succinate (TOPROL-XL) 100 MG 24 hr tablet Take 1 tablet (100 mg total) by mouth daily.  [name] omeprazole (PRILOSEC) 20 MG capsule Take 20 mg by mouth daily.    . rosuvastatin (CRESTOR) 5 MG tablet Take 5 mg by mouth daily.  . Tamsulosin HCl (FLOMAX) 0.4 MG CAPS Take 0.4 mg by mouth daily after supper.   Review of Systems  Constitutional: Negative.   HENT:  Negative.   Eyes: Negative.   Respiratory: Negative.   Cardiovascular: Negative.   Gastrointestinal: Negative.   Endocrine: Negative.   Musculoskeletal: Positive for gait problem.  Skin: Negative.   Allergic/Immunologic: Negative.   Neurological: Negative.   Hematological: Negative.   Psychiatric/Behavioral: Negative.   All other systems reviewed and are negative.    There were no vitals taken for this visit.  Physical Exam  Nursing note and vitals reviewed. Constitutional: He is oriented to person, place, and time. He appears well-developed and well-nourished.  HENT:  Head: Normocephalic.  Nose: Nose normal.  Mouth/Throat: Oropharynx is clear and moist.  Eyes: Conjunctivae are normal. Pupils are equal, round, and reactive to light.  Neck: Normal range of motion. Neck supple. No JVD present.  Cardiovascular: Normal rate, S1 normal, S2 normal,  normal heart sounds and intact distal pulses.  An irregularly irregular rhythm present. Exam reveals no gallop and no friction rub.   No murmur heard. Pulmonary/Chest: Effort normal. No respiratory distress. He has no wheezes. He exhibits no tenderness.  Abdominal: Soft. Bowel sounds are normal. He exhibits no distension. There is no tenderness.  Musculoskeletal: Normal range of motion. He exhibits no edema and no tenderness.  Lymphadenopathy:    He has no cervical adenopathy.  Neurological: He is alert and oriented to person, place, and time. Coordination normal.  Skin: Skin is warm and dry. No rash noted. No erythema.  Psychiatric: He has a normal mood and affect. His behavior is normal. Judgment and thought content normal.    EKG: Atrial fibrillation with ST and T wave changes in the anterior and inferior leads    Assessment and Plan

## 2013-07-09 ENCOUNTER — Other Ambulatory Visit (INDEPENDENT_AMBULATORY_CARE_PROVIDER_SITE_OTHER): Payer: Medicare PPO

## 2013-07-09 ENCOUNTER — Ambulatory Visit (INDEPENDENT_AMBULATORY_CARE_PROVIDER_SITE_OTHER): Payer: Medicare PPO

## 2013-07-09 ENCOUNTER — Other Ambulatory Visit: Payer: Self-pay

## 2013-07-09 DIAGNOSIS — R0602 Shortness of breath: Secondary | ICD-10-CM

## 2013-07-09 DIAGNOSIS — R079 Chest pain, unspecified: Secondary | ICD-10-CM

## 2013-07-09 DIAGNOSIS — I4891 Unspecified atrial fibrillation: Secondary | ICD-10-CM

## 2013-07-09 DIAGNOSIS — I509 Heart failure, unspecified: Secondary | ICD-10-CM

## 2013-07-10 LAB — BASIC METABOLIC PANEL
BUN/Creatinine Ratio: 16 (ref 10–22)
BUN: 31 mg/dL — ABNORMAL HIGH (ref 8–27)
CALCIUM: 9 mg/dL (ref 8.6–10.2)
CO2: 25 mmol/L (ref 18–29)
CREATININE: 1.98 mg/dL — AB (ref 0.76–1.27)
Chloride: 99 mmol/L (ref 97–108)
GFR calc non Af Amer: 32 mL/min/{1.73_m2} — ABNORMAL LOW (ref >59)
GFR, EST AFRICAN AMERICAN: 37 mL/min/{1.73_m2} — AB (ref >59)
Glucose: 104 mg/dL — ABNORMAL HIGH (ref 65–99)
POTASSIUM: 5.4 mmol/L — AB (ref 3.5–5.2)
SODIUM: 142 mmol/L (ref 134–144)

## 2013-07-12 ENCOUNTER — Encounter: Payer: Self-pay | Admitting: Cardiovascular Disease

## 2013-07-12 ENCOUNTER — Ambulatory Visit (INDEPENDENT_AMBULATORY_CARE_PROVIDER_SITE_OTHER): Payer: Medicare PPO | Admitting: Cardiovascular Disease

## 2013-07-12 VITALS — BP 130/80 | HR 75 | Ht 73.0 in | Wt 253.0 lb

## 2013-07-12 DIAGNOSIS — R079 Chest pain, unspecified: Secondary | ICD-10-CM

## 2013-07-12 DIAGNOSIS — I5032 Chronic diastolic (congestive) heart failure: Secondary | ICD-10-CM

## 2013-07-12 DIAGNOSIS — I4891 Unspecified atrial fibrillation: Secondary | ICD-10-CM

## 2013-07-12 DIAGNOSIS — Z950 Presence of cardiac pacemaker: Secondary | ICD-10-CM

## 2013-07-12 DIAGNOSIS — R0602 Shortness of breath: Secondary | ICD-10-CM

## 2013-07-12 DIAGNOSIS — J449 Chronic obstructive pulmonary disease, unspecified: Secondary | ICD-10-CM

## 2013-07-12 DIAGNOSIS — E785 Hyperlipidemia, unspecified: Secondary | ICD-10-CM

## 2013-07-12 DIAGNOSIS — I10 Essential (primary) hypertension: Secondary | ICD-10-CM

## 2013-07-12 DIAGNOSIS — N183 Chronic kidney disease, stage 3 unspecified: Secondary | ICD-10-CM

## 2013-07-12 DIAGNOSIS — I25810 Atherosclerosis of coronary artery bypass graft(s) without angina pectoris: Secondary | ICD-10-CM

## 2013-07-12 MED ORDER — TORSEMIDE 20 MG PO TABS: 20.0000 mg | ORAL_TABLET | Freq: Two times a day (BID) | ORAL | Status: DC

## 2013-07-12 NOTE — Assessment & Plan Note (Signed)
Followed by EP 

## 2013-07-12 NOTE — Assessment & Plan Note (Signed)
I suspect he has underlying COPD, also likely has damage from prior work exposure in [employer]. This could explain his pulmonary hypertension

## 2013-07-12 NOTE — Patient Instructions (Addendum)
You are doing well. Please hold the lasix/furosemide Start torsemide 20 mg 2 pills at lunch Hold the torsemide for weight less than 237  Please call us if you have new issues that need to be addressed before your next appt.  Your physician wants you to follow-up in: 1 month.

## 2013-07-12 NOTE — Progress Notes (Signed)
Patient ID: [name], male    DOB: 15-Feb-1934, 80 y.o.   MRN: [id number]  HPI Comments: 80 year old gentleman with a history of coronary artery disease, bypass surgery in 1999 with catheterization in 2007 with patent LIMA to the LAD, occluded vein graft to the OM, history of Cypher stent to the PL branch and mid RCA in September 07, ejection fraction 45-50%, chronic atrial fibrillation with Medtronic pacemaker, hx of previous recurrent falls and injury from alcohol abuse (stopped drinking more recently), Pacemaker was placed by [facility] heart and vascular in 2010 for syncope, AFib with pauses. He presents for routine followup. 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  Previous hospitalization for malaise, dehydration, creatinine greater than 3. Lisinopril and Lasix was discontinued.  On a prior clinic visit, he reported weight gain, leg edema, abdominal swelling, coughing when supine. He was started on Lasix 20 mg daily. He felt better restarting Lasix daily  On his last clinic visit with myself, he was taking Lasix 20 mg twice a day Renal function has been getting worse through 2015   On his last visit he saw Dr. Fletcher Anon. Echocardiogram was ordered that showed mild to moderate pulmonary hypertension. Creatinine last week of 2. He started taking Lasix 40 mg total, 20 mg twice a day with minimal change in his weight. He reports when he came home from the hospital, his weight was in the 137 range, now is 143 pounds at home.  History of positive sleep study, was on CPAP for several months. Recent change to his insurance now has no coverage  Previous lab work from 10/23/2012 shows total cholesterol 212, LDL 142, HDL 50, creatinine 1.7, BUN 22 He is status post right knee replacement.   EKG shows atrial fibrillation , rate 75 beats per minute, paced beats, diffuse T-wave abnormality anterolateral and inferior leads   Past Medical History   .  Chronic  atrial fibrillation         a. on pradaxa   .  Coronary artery disease         a. s/p MI x 3;  b. s/p CABG in 1999;  c. Cath 2007: LIMA->LAD patent, VG->OM 100, PCI/DES of RPL/mid RCA (cypher DES).   .  Chronic obstructive pulmonary disease     .  GERD (gastroesophageal reflux disease)     .  Hyperlipidemia     .  Hypertension     .  PVD (peripheral vascular disease)     .  AAA (abdominal aortic aneurysm)         a. s/p repair   .  Chronic diastolic CHF (congestive heart failure)         a. 01/2012 Echo:  EF 55%.   .  Tachy-brady syndrome         a. s/p PPM 2010.   [name]  CKD (chronic kidney disease), stage III         a. Acute on chronic 03/2012   .  Cancer         skin cancer nose      Outpatient Encounter Prescriptions as of 07/12/2013  Medication Sig  . amLODipine (NORVASC) 5 MG tablet Take 1 tablet (5 mg total) by mouth daily.  . dabigatran (PRADAXA) 150 MG CAPS capsule Take 1 capsule (150 mg total) by mouth every 12 (twelve) hours.  . furosemide (LASIX) 40 MG tablet Take 1 tablet (40 mg total) by mouth daily.  . metoprolol succinate (TOPROL-XL)  100 MG 24 hr tablet Take 1 tablet (100 mg total) by mouth daily.  [name] omeprazole (PRILOSEC) 20 MG capsule Take 20 mg by mouth daily.    . rosuvastatin (CRESTOR) 5 MG tablet Take 5 mg by mouth daily.  . Tamsulosin HCl (FLOMAX) 0.4 MG CAPS Take 0.4 mg by mouth daily after supper.    Review of Systems  Constitutional: Negative.   HENT: Negative.   Eyes: Negative.   Respiratory: Negative.   Cardiovascular: Negative.   Gastrointestinal: Negative.   Endocrine: Negative.   Musculoskeletal: Positive for gait problem.  Skin: Negative.   Allergic/Immunologic: Negative.   Neurological: Negative.   Hematological: Negative.   Psychiatric/Behavioral: Negative.   All other systems reviewed and are negative.    BP 130/80  Pulse 75  Ht 6\' 1"  (1.854 m)  Wt 253 lb (114.76 kg)  BMI 33.39 kg/m2  Physical Exam  Nursing note and vitals  reviewed. Constitutional: He is oriented to person, place, and time. He appears well-developed and well-nourished.  HENT:  Head: Normocephalic.  Nose: Nose normal.  Mouth/Throat: Oropharynx is clear and moist.  Eyes: Conjunctivae are normal. Pupils are equal, round, and reactive to light.  Neck: Normal range of motion. Neck supple. No JVD present.  Cardiovascular: Normal rate, S1 normal, S2 normal, normal heart sounds and intact distal pulses.  An irregularly irregular rhythm present. Exam reveals no gallop and no friction rub.   No murmur heard. Pulmonary/Chest: Effort normal. No respiratory distress. He has no wheezes. He exhibits no tenderness.  Abdominal: Soft. Bowel sounds are normal. He exhibits no distension. There is no tenderness.  Musculoskeletal: Normal range of motion. He exhibits no edema and no tenderness.  Lymphadenopathy:    He has no cervical adenopathy.  Neurological: He is alert and oriented to person, place, and time. Coordination normal.  Skin: Skin is warm and dry. No rash noted. No erythema.  Psychiatric: He has a normal mood and affect. His behavior is normal. Judgment and thought content normal.      Assessment and Plan

## 2013-07-12 NOTE — Assessment & Plan Note (Signed)
He continues to have chronic mild shortness of breath. Unable to ambulate as he would like. He reports that he felt better with weight around 137 pounds which was his weight after he left the hospital. He reports the Lasix 20 mg twice a day is not working for him. We have suggested he try torsemide 20 mg twice a day. Recommended he hold the torsemide for weight less than 137 pounds. He does not want to have followup with the renal service for his worsening renal failure.

## 2013-07-12 NOTE — Assessment & Plan Note (Addendum)
Recommended he continue his Crestor. Prior cholesterol not at goal. Repeat lipid panel with Dr. Kary Kos with routine labs.

## 2013-07-12 NOTE — Assessment & Plan Note (Signed)
Chronic atrial fibrillation. Heart rate relatively well controlled

## 2013-07-12 NOTE — Assessment & Plan Note (Signed)
Blood pressure is well controlled on today's visit. No changes made to the medications. 

## 2013-07-12 NOTE — Assessment & Plan Note (Signed)
Currently with no symptoms of angina. No further workup at this time. Continue current medication regimen. 

## 2013-07-12 NOTE — Assessment & Plan Note (Signed)
Last creatinine up to 2. Change for the worse on the last three checks in 2015. He does not on followup with the kidney doctors at this time. He recommended we see him in one month with BMP at that time

## 2013-08-13 ENCOUNTER — Ambulatory Visit (INDEPENDENT_AMBULATORY_CARE_PROVIDER_SITE_OTHER): Payer: Medicare PPO | Admitting: Cardiovascular Disease

## 2013-08-13 ENCOUNTER — Encounter: Payer: Self-pay | Admitting: Cardiovascular Disease

## 2013-08-13 VITALS — BP 110/68 | HR 68 | Ht 73.0 in | Wt 243.5 lb

## 2013-08-13 DIAGNOSIS — I5030 Unspecified diastolic (congestive) heart failure: Secondary | ICD-10-CM

## 2013-08-13 DIAGNOSIS — I4891 Unspecified atrial fibrillation: Secondary | ICD-10-CM

## 2013-08-13 DIAGNOSIS — I10 Essential (primary) hypertension: Secondary | ICD-10-CM

## 2013-08-13 DIAGNOSIS — I25810 Atherosclerosis of coronary artery bypass graft(s) without angina pectoris: Secondary | ICD-10-CM

## 2013-08-13 DIAGNOSIS — R0602 Shortness of breath: Secondary | ICD-10-CM

## 2013-08-13 DIAGNOSIS — N183 Chronic kidney disease, stage 3 unspecified: Secondary | ICD-10-CM

## 2013-08-13 DIAGNOSIS — I5032 Chronic diastolic (congestive) heart failure: Secondary | ICD-10-CM

## 2013-08-13 DIAGNOSIS — I509 Heart failure, unspecified: Secondary | ICD-10-CM

## 2013-08-13 DIAGNOSIS — E785 Hyperlipidemia, unspecified: Secondary | ICD-10-CM

## 2013-08-13 NOTE — Assessment & Plan Note (Signed)
9 pound weight loss over the past month on torsemide. Suggested he stay on 20 mg daily. Extra torsemide for any weight gain. We'll check basic metabolic panel today

## 2013-08-13 NOTE — Assessment & Plan Note (Signed)
Heart rate relatively well-controlled on his current medications

## 2013-08-13 NOTE — Assessment & Plan Note (Signed)
Currently with no symptoms of angina. No further workup at this time. Continue current medication regimen. 

## 2013-08-13 NOTE — Progress Notes (Signed)
Patient ID: [name], male    DOB: Dec 22, 1933, 80 y.o.   MRN: [id number]  HPI Comments: 80 year old gentleman with a history of coronary artery disease, bypass surgery in 1999 with catheterization in 2007 with patent LIMA to the LAD, occluded vein graft to the OM, history of Cypher stent to the PL branch and mid RCA in September 07, ejection fraction 45-50%, chronic atrial fibrillation with [employer] pacemaker, hx of previous recurrent falls and injury from alcohol abuse (stopped drinking more recently), Pacemaker was placed by [facility] in 2010 for syncope, AFib with pauses. He presents for routine followup. 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  Previous hospitalization for malaise, dehydration, creatinine greater than 3. Lisinopril and Lasix was discontinued.  On a prior clinic visit, he reported weight gain, leg edema, abdominal swelling, coughing when supine. He was started on Lasix 20 mg daily. On his last clinic visit with myself, he was taking Lasix 20 mg twice a day Renal function has been getting worse through 2015    he saw Dr. Fletcher Anon. Echocardiogram was ordered that showed mild to moderate pulmonary hypertension. Creatinine of 2.  He started taking Lasix 40 mg total, 20 mg twice a day with minimal change in his weight. He reports when he came home from the hospital, his weight was in the 137 range  On his last clinic visit, he reported worsening weight gain, shortness of breath. He was changed to torsemide 20 mg twice a day In followup today he has had a 10 pound weight loss, feels better, he is bothered by polyuria. He's had incontinence episodes No complaints of shortness of breath or leg edema Weight at home is 139 pounds, 243 pounds in the office  he continues to drink significant fluids at home  History of positive sleep study, was on CPAP for several months. Recent change to his insurance now has no  coverage  Previous lab work from 10/23/2012 shows total cholesterol 212, LDL 142, HDL 50, creatinine 1.7, BUN 22 He is status post right knee replacement.   EKG shows atrial fibrillation , rate 68 beats per minute, paced beats, diffuse T-wave abnormality anterolateral and inferior leads, Paced beats noted   Past Medical History   .  Chronic atrial fibrillation         a. on pradaxa   .  Coronary artery disease         a. s/p MI x 3;  b. s/p CABG in 1999;  c. Cath 2007: LIMA->LAD patent, VG->OM 100, PCI/DES of RPL/mid RCA (cypher DES).   .  Chronic obstructive pulmonary disease     .  GERD (gastroesophageal reflux disease)     .  Hyperlipidemia     .  Hypertension     .  PVD (peripheral vascular disease)     .  AAA (abdominal aortic aneurysm)         a. s/p repair   .  Chronic diastolic CHF (congestive heart failure)         a. 01/2012 Echo:  EF 55%.   .  Tachy-brady syndrome         a. s/p PPM 2010.   [name]  CKD (chronic kidney disease), stage III         a. Acute on chronic 03/2012   .  Cancer         skin cancer nose      Outpatient Encounter Prescriptions as of  08/13/2013  Medication Sig  . amLODipine (NORVASC) 5 MG tablet Take 1 tablet (5 mg total) by mouth daily.  . dabigatran (PRADAXA) 150 MG CAPS capsule Take 1 capsule (150 mg total) by mouth every 12 (twelve) hours.  . metoprolol succinate (TOPROL-XL) 100 MG 24 hr tablet Take 1 tablet (100 mg total) by mouth daily.  [name] omeprazole (PRILOSEC) 20 MG capsule Take 20 mg by mouth daily.    . rosuvastatin (CRESTOR) 5 MG tablet Take 5 mg by mouth daily.  . Tamsulosin HCl (FLOMAX) 0.4 MG CAPS Take 0.4 mg by mouth daily after supper.  . torsemide (DEMADEX) 20 MG tablet Take 1 tablet (20 mg total) by mouth 2 (two) times daily. He has decreased the dose down to one a day     Review of Systems  Constitutional: Negative.   HENT: Negative.   Eyes: Negative.   Respiratory: Negative.   Cardiovascular: Negative.   Gastrointestinal:  Negative.   Endocrine: Negative.   Genitourinary: Positive for urgency.       Incontinence  Musculoskeletal: Positive for gait problem.  Skin: Negative.   Allergic/Immunologic: Negative.   Neurological: Negative.   Hematological: Negative.   Psychiatric/Behavioral: Negative.   All other systems reviewed and are negative.   BP 110/68  Pulse 68  Ht 6\' 1"  (1.854 m)  Wt 243 lb 8 oz (110.451 kg)  BMI 32.13 kg/m2  Physical Exam  Nursing note and vitals reviewed. Constitutional: He is oriented to person, place, and time. He appears well-developed and well-nourished.  HENT:  Head: Normocephalic.  Nose: Nose normal.  Mouth/Throat: Oropharynx is clear and moist.  Eyes: Conjunctivae are normal. Pupils are equal, round, and reactive to light.  Neck: Normal range of motion. Neck supple. No JVD present.  Cardiovascular: Normal rate, S1 normal, S2 normal, normal heart sounds and intact distal pulses.  An irregularly irregular rhythm present. Exam reveals no gallop and no friction rub.   No murmur heard. Pulmonary/Chest: Effort normal and breath sounds normal. No respiratory distress. He has no wheezes. He has no rales. He exhibits no tenderness.  Abdominal: Soft. Bowel sounds are normal. He exhibits no distension. There is no tenderness.  Musculoskeletal: Normal range of motion. He exhibits no edema and no tenderness.  Lymphadenopathy:    He has no cervical adenopathy.  Neurological: He is alert and oriented to person, place, and time. Coordination normal.  Skin: Skin is warm and dry. No rash noted. No erythema.  Psychiatric: He has a normal mood and affect. His behavior is normal. Judgment and thought content normal.      Assessment and Plan

## 2013-08-13 NOTE — Assessment & Plan Note (Signed)
Blood pressure is well controlled on today's visit. No changes made to the medications. 

## 2013-08-13 NOTE — Patient Instructions (Signed)
You are doing well.  Please take torsemide one a day  Goal weight 240 pounds Take extra torsemide for weight >243  We will check labs today  Please call us if you have new issues that need to be addressed before your next appt.  Your physician wants you to follow-up in: 3 months.  You will receive a reminder letter in the mail two months in advance. If you don't receive a letter, please call our office to schedule the follow-up appointment.

## 2013-08-13 NOTE — Assessment & Plan Note (Signed)
Recommended that he stay on his Crestor 

## 2013-08-13 NOTE — Assessment & Plan Note (Signed)
He has mild general malaise. We will check basic metabolic panel today given his recent aggressive diuresis

## 2013-08-14 LAB — BASIC METABOLIC PANEL
BUN/Creatinine Ratio: 17 (ref 10–22)
BUN: 35 mg/dL — ABNORMAL HIGH (ref 8–27)
CO2: 22 mmol/L (ref 18–29)
CREATININE: 2.12 mg/dL — AB (ref 0.76–1.27)
Calcium: 9.4 mg/dL (ref 8.6–10.2)
Chloride: 101 mmol/L (ref 97–108)
GFR, EST AFRICAN AMERICAN: 33 mL/min/{1.73_m2} — AB (ref >59)
GFR, EST NON AFRICAN AMERICAN: 29 mL/min/{1.73_m2} — AB (ref >59)
GLUCOSE: 100 mg/dL — AB (ref 65–99)
Potassium: 4.6 mmol/L (ref 3.5–5.2)
Sodium: 143 mmol/L (ref 134–144)

## 2013-08-15 ENCOUNTER — Telehealth: Payer: Self-pay | Admitting: Cardiology

## 2013-08-15 ENCOUNTER — Encounter: Payer: Medicare PPO | Admitting: *Deleted

## 2013-08-15 NOTE — Telephone Encounter (Signed)
I called to remind pt of remote transmission that needed to be done. Pt wife stated that they done it this AM at 8. I Informed her that we did not received the transmission and asked her if they could resend it. She stated that when he got home they would re send it.

## 2013-08-20 ENCOUNTER — Telehealth: Payer: Self-pay | Admitting: *Deleted

## 2013-08-20 NOTE — Telephone Encounter (Signed)
Please call patient. Still having problems with the remote access???

## 2013-08-20 NOTE — Telephone Encounter (Signed)
Spoke with wife, we have scheduled him for an office visit 6/16 since he is unable to to remote follow up.

## 2013-09-03 ENCOUNTER — Telehealth: Payer: Self-pay | Admitting: *Deleted

## 2013-09-03 NOTE — Telephone Encounter (Signed)
Please call patient has some question about his pacemake machine.

## 2013-09-04 NOTE — Telephone Encounter (Signed)
Left message 5/27

## 2013-09-06 NOTE — Telephone Encounter (Signed)
Patient now has a home phone and would like to set up remote pacer check

## 2013-09-06 NOTE — Telephone Encounter (Signed)
LVM 5/29

## 2013-09-06 NOTE — Telephone Encounter (Signed)
Office visit for 6/16 changed to a remote at patient's request.

## 2013-09-17 ENCOUNTER — Other Ambulatory Visit: Payer: Medicare PPO

## 2013-09-24 ENCOUNTER — Encounter: Payer: Commercial Managed Care - HMO | Admitting: *Deleted

## 2013-09-24 ENCOUNTER — Telehealth: Payer: Self-pay | Admitting: *Deleted

## 2013-09-24 ENCOUNTER — Ambulatory Visit (INDEPENDENT_AMBULATORY_CARE_PROVIDER_SITE_OTHER): Payer: Medicare PPO | Admitting: *Deleted

## 2013-09-24 ENCOUNTER — Other Ambulatory Visit: Payer: Self-pay | Admitting: Internal Medicine

## 2013-09-24 ENCOUNTER — Telehealth: Payer: Self-pay | Admitting: Cardiology

## 2013-09-24 DIAGNOSIS — I4891 Unspecified atrial fibrillation: Secondary | ICD-10-CM

## 2013-09-24 NOTE — Telephone Encounter (Signed)
Placed samples of Pradaxa 150 mg and Crestor 5 mg.

## 2013-09-24 NOTE — Telephone Encounter (Signed)
Spoke with pt wife and confirmed remote transmission. Pt wife state that they switched to [employer] cable phone and have had issues sent. I asked her if she had a filter for her monitor and she said she thought so. I gave her technical support number to call and receive help trouble shooting monitor.

## 2013-09-24 NOTE — Progress Notes (Signed)
PPM check in office. 

## 2013-09-24 NOTE — Telephone Encounter (Signed)
Patient called wanting samples of Pradaxa and Crestor. Please call when ready.

## 2013-09-25 ENCOUNTER — Encounter: Payer: Self-pay | Admitting: Cardiology

## 2013-09-27 ENCOUNTER — Encounter: Payer: Self-pay | Admitting: Nurse Practitioner

## 2013-09-27 ENCOUNTER — Ambulatory Visit (INDEPENDENT_AMBULATORY_CARE_PROVIDER_SITE_OTHER): Payer: Commercial Managed Care - HMO | Admitting: Nurse Practitioner

## 2013-09-27 VITALS — BP 130/78 | HR 74 | Ht 72.0 in | Wt 239.0 lb

## 2013-09-27 DIAGNOSIS — I509 Heart failure, unspecified: Secondary | ICD-10-CM

## 2013-09-27 DIAGNOSIS — I4891 Unspecified atrial fibrillation: Secondary | ICD-10-CM

## 2013-09-27 DIAGNOSIS — M20031 Swan-neck deformity of right finger(s): Secondary | ICD-10-CM

## 2013-09-27 DIAGNOSIS — I495 Sick sinus syndrome: Secondary | ICD-10-CM | POA: Insufficient documentation

## 2013-09-27 DIAGNOSIS — M20039 Swan-neck deformity of unspecified finger(s): Secondary | ICD-10-CM

## 2013-09-27 DIAGNOSIS — I5032 Chronic diastolic (congestive) heart failure: Secondary | ICD-10-CM | POA: Insufficient documentation

## 2013-09-27 DIAGNOSIS — Z01818 Encounter for other preprocedural examination: Secondary | ICD-10-CM

## 2013-09-27 DIAGNOSIS — N184 Chronic kidney disease, stage 4 (severe): Secondary | ICD-10-CM

## 2013-09-27 DIAGNOSIS — I2510 Atherosclerotic heart disease of native coronary artery without angina pectoris: Secondary | ICD-10-CM

## 2013-09-27 DIAGNOSIS — I482 Chronic atrial fibrillation, unspecified: Secondary | ICD-10-CM

## 2013-09-27 NOTE — Patient Instructions (Addendum)
[place]  Your caregiver has ordered a Stress Test with nuclear imaging. The purpose of this test is to evaluate the blood supply to your heart muscle. This procedure is referred to as a "Non-Invasive Stress Test." This is because other than having an IV started in your vein, nothing is inserted or "invades" your body. Cardiac stress tests are done to find areas of poor blood flow to the heart by determining the extent of coronary artery disease (CAD). Some patients exercise on a treadmill, which naturally increases the blood flow to your heart, while others who are  unable to walk on a treadmill due to physical limitations have a pharmacologic/chemical stress agent called Lexiscan . This medicine will mimic walking on a treadmill by temporarily increasing your coronary blood flow.   Please note: these test may take anywhere between 2-4 hours to complete  PLEASE REPORT TO [facility] AT THE FIRST DESK WILL DIRECT YOU WHERE TO GO  Date of Procedure:___Wednesday, June 24________________  Arrival Time for Procedure:________7:45am________________  Instructions regarding medication:   __X__:  Hold betablocker(s) night before procedure and morning of procedure:  DO NOT TAKE YOUR METOPROLOL AM OF TEST   PLEASE NOTIFY THE OFFICE AT LEAST 24 HOURS IN ADVANCE IF YOU ARE UNABLE TO KEEP YOUR APPOINTMENT.  [phone] AND  PLEASE NOTIFY NUCLEAR MEDICINE AT [facility] AT LEAST 24 HOURS IN ADVANCE IF YOU ARE UNABLE TO KEEP YOUR APPOINTMENT. [phone]  How to prepare for your Myoview test:  1. Do not eat or drink after midnight 2. No caffeine for 24 hours prior to test 3. No smoking 24 hours prior to test. 4. Your medication may be taken with water.  If your doctor stopped a medication because of this test, do not take that medication. 5. Ladies, please do not wear dresses.  Skirts or pants are appropriate. Please wear a short sleeve shirt. 6. No perfume, cologne or  lotion. 7. Wear comfortable walking shoes. No heels!  Your physician recommends that you schedule a follow-up appointment in: 6 months

## 2013-09-27 NOTE — Progress Notes (Signed)
Patient Name: [name] Date of Encounter: 09/27/2013  Primary Care Provider:  Maryland Pink, MD Primary Cardiologist:  Dr. Rockey Situ, MD  Patient Profile  80 year old male with history of CAD s/p MI and 2 vessel CABG in 1999 at [facility], AAA s/p repair 1999 at [facility], chronic a fib s/p permanent pacemaker for tacy-brady syndrome, chronic diastolic CHF, CKD stage IV, COPD, HL, HTN, and GERD.  Problem List   Past Medical History  Diagnosis Date  . Chronic atrial fibrillation     a. on pradaxa  . Coronary artery disease     a. s/p MI x 3;  b. s/p CABG x2 in 1999;  c. Cath 2007: LIMA->LAD patent, VG->OM 100, PCI/DES of RPL/mid RCA (cypher DES).  . Chronic obstructive pulmonary disease   . GERD (gastroesophageal reflux disease)   . Hyperlipidemia   . Hypertension   . PVD (peripheral vascular disease)   . AAA (abdominal aortic aneurysm)     a. s/p repair - 1999 @ [facility] @ time of CABG.  . Chronic diastolic CHF (congestive heart failure)     a. 01/2012 Echo:  EF 55%.  . Tachy-brady syndrome     a. s/p PPM 2010.  [name] CKD (chronic kidney disease), stage III     a. Acute on chronic 03/2012  . Cancer     skin cancer nose  . Sleep apnea    Past Surgical History  Procedure Laterality Date  . Coronary artery bypass graft    . Abdominal aortic aneurysm repair    . Insert / replace / remove pacemaker    . Foot surgery      left foot surgery to remove cyst.  . Trigger finger release    . Skin cancer biopsy      nose    Allergies  Allergies  Allergen Reactions  . Atorvastatin     HPI  80 year old male with the above problem list here for pre-op evaluation of his right 5th digit. Patient has a planned procedure by Dr. 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 He also has a similar deformity of the contralateral 5th digit, but not as severe. He does not plan to have this worked on at this time. He did have this procedure done to the left 4th digit a couple of years  ago without issues. Patient complains of the right 5th digit clicking and locking. States it is uncomfortable. He is right handed.   He has history of NSTEMI in 1999. At that time he underwent 2 vessel CABG, LIMA to LAD and vein graft to OM, at [facility]. During this hospitalization it was found that he had a AAA. This too was repaired during this hospitalization by Dr. Donnetta Hutching. He reports having had subsequent studies of his aorta and them being fine. In 2007 he was admitted to [facility] for chest pain and NSTEMI. Cath revealed LM with no significant dz. LAD 100% proximally. Mid and distal LAD fill the patent LIMA which inserts to the portion of hte LAD. LCx diffuse 60% mid vessel dz. RCA 60-70% sequential lesions along distal mid segment. 50% distal RCA prior to bifurcation. Patient had successful placement of Cypher stent in the proximal posteriolateral artery lesion. Cypher stent in the mid RCA lesion.    His chronic a fib permanent pacemaker controlled. This was last checked in the [place] office on 09/24/13. He is on Pradaxa 150 mg bid and tolerating without issues.   He does note  improved SOB since both the pollen count has improved and the initiation of torsemide 20 mg daily back on 07/12/13 from furosemide 40 mg daily. He lives a pretty sedentary lifestyle, not doing too much at home. He occasionally experiences left sided c/p without any rhyme or reason.  He denies any lower extremity edema, abdominal fullness, clothes fitting tighter, nausea, vomiting, diaphoresis, dizziness, presyncope, or syncope.   Prior tobacco smoker of 10 pack years. Quit in 1999. No ETOH or drugs. Born in [place].     Home Medications  Prior to Admission medications   Medication Sig Start Date End Date Taking? Authorizing Provider  amLODipine (NORVASC) 5 MG tablet Take 1 tablet (5 mg total) by mouth daily. 07/01/13  Yes Wellington Hampshire, MD  dabigatran (PRADAXA) 150 MG CAPS capsule Take 1 capsule (150 mg total) by mouth  every 12 (twelve) hours. 03/05/13  Yes Minna Merritts, MD  metoprolol succinate (TOPROL-XL) 100 MG 24 hr tablet Take 1 tablet (100 mg total) by mouth daily. 06/03/13  Yes Deboraha Sprang, MD  omeprazole (PRILOSEC) 20 MG capsule Take 20 mg by mouth daily.     Yes Historical Provider, MD  rosuvastatin (CRESTOR) 5 MG tablet Take 5 mg by mouth daily.   Yes Historical Provider, MD  Tamsulosin HCl (FLOMAX) 0.4 MG CAPS Take 0.4 mg by mouth daily after supper.   Yes Historical Provider, MD  torsemide (DEMADEX) 20 MG tablet Take 20 mg by mouth once. 07/12/13  Yes Minna Merritts, MD    Family History  Family History  Problem Relation Age of Onset  . Stomach cancer Mother     died @ 21  . Diabetes Mother   . Hypertension Mother   . Coronary artery disease Mother   . Cerebral aneurysm Father     died @ 73  . Diabetes Father   . Hypertension Father   . Coronary artery disease Father     Social History  History   Social History  . Marital Status: Married    Spouse Name: N/A    Number of Children: N/A  . Years of Education: N/A   Occupational History  . Not on file.   Social History Main Topics  . Smoking status: Former Smoker -- 1.00 packs/day for 20 years    Quit date: 04/11/1997  . Smokeless tobacco: Never Used  . Alcohol Use: No     Comment: Rare  . Drug Use: No  . Sexual Activity: Not on file   Other Topics Concern  . Not on file   Social History Narrative   Lives locally with his wife.  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       Review of Systems General:  No chills, fever, night sweats or weight changes.  Cardiovascular:  No chest pain, dyspnea on exertion, edema, orthopnea, palpitations, paroxysmal nocturnal dyspnea. Dermatological: No rash, lesions/masses Respiratory: No cough, dyspnea Urologic: No hematuria, dysuria Abdominal:   No nausea, vomiting, diarrhea, bright red blood per rectum, melena, or hematemesis Neurologic:  No visual changes, wkns,  changes in mental status. All other systems reviewed and are otherwise negative except as noted above.  Physical Exam  Blood pressure 130/78, pulse 74, height 6' (1.829 m), weight 239 lb (108.41 kg).  General: Pleasant, NAD Psych: Normal affect. Neuro: Alert and oriented X 3. Moves all extremities spontaneously. HEENT: Normal  Neck: Supple without bruits or JVD. Lungs:  Resp regular and unlabored, CTA. Heart: RRR no s3,  s4, or murmurs. Abdomen: Soft, non-tender, non-distended, BS + x 4.  Extremities: No clubbing, cyanosis or edema. DP/PT/Radials 2+ and equal bilaterally. 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    Accessory Clinical Findings  ECG - Ventricular paced with underlying a fib, 66  Assessment & Plan  1. CAD: S/p NSTEMI and 2 vessel CABG in 1999 and 2 Cypher stents in 2007. He has not had any further cardiac testing since this time. Given  Baseline doe (though improved with diuretics) and occasional, episodic c/p, will proceed with a Lexiscan Myoview prior to clearing him for his procedure on his pinky finger. He is currently on bb and statin. Asa stopped previously.   2. Chronic a fib: Permanent pacemaker in place. This was checked in the office on 09/24/13. Currently on Pradaxa 150 mg bid. Given that he has no prior h/o CVA, I do not think we will have to bridge his anticoagulation.   3. Chronic diastolic CHF: Last echo on 07/09/13 indicated EF of 55-65%. No wall motion abnormalities. Continue torsemide. No active SOB.  HR/BP stable.  4. CKD stage IV: Labs on 08/13/13 indicated creatinine of 2.12 and GFR 29. Torsemide was decreased to daily at this time. Kidney diet and optimal blood pressure control are a must.   5. Pre-op clearance: Plan for Lexiscan Myoview next week prior to clearing.   6. HTN: Well controlled. Continue current medications. Optimal control is a must.   7.  Dispo:  F/U with Dr. Rockey Situ in 6 mos or sooner  if necessary.   Murray Hodgkins, NP 09/27/2013, 3:50 PM

## 2013-09-30 ENCOUNTER — Ambulatory Visit: Payer: Self-pay | Admitting: Specialist

## 2013-09-30 ENCOUNTER — Telehealth: Payer: Self-pay | Admitting: *Deleted

## 2013-09-30 DIAGNOSIS — Z951 Presence of aortocoronary bypass graft: Secondary | ICD-10-CM

## 2013-09-30 LAB — CBC WITH DIFFERENTIAL/PLATELET
Basophil #: 0.1 10*3/uL (ref 0.0–0.1)
Basophil %: 1.1 %
Eosinophil #: 0.4 10*3/uL (ref 0.0–0.7)
Eosinophil %: 5.5 %
HCT: 40.1 % (ref 40.0–52.0)
HGB: 12.8 g/dL — AB (ref 13.0–18.0)
LYMPHS PCT: 25.1 %
Lymphocyte #: 1.8 10*3/uL (ref 1.0–3.6)
MCH: 29.9 pg (ref 26.0–34.0)
MCHC: 31.8 g/dL — ABNORMAL LOW (ref 32.0–36.0)
MCV: 94 fL (ref 80–100)
Monocyte #: 0.7 x10 3/mm (ref 0.2–1.0)
Monocyte %: 9.9 %
Neutrophil #: 4.2 10*3/uL (ref 1.4–6.5)
Neutrophil %: 58.4 %
Platelet: 203 10*3/uL (ref 150–440)
RBC: 4.27 10*6/uL — ABNORMAL LOW (ref 4.40–5.90)
RDW: 15 % — AB (ref 11.5–14.5)
WBC: 7.1 10*3/uL (ref 3.8–10.6)

## 2013-09-30 LAB — BASIC METABOLIC PANEL
Anion Gap: 5 — ABNORMAL LOW (ref 7–16)
BUN: 26 mg/dL — ABNORMAL HIGH (ref 7–18)
CO2: 29 mmol/L (ref 21–32)
Calcium, Total: 9.4 mg/dL (ref 8.5–10.1)
Chloride: 105 mmol/L (ref 98–107)
Creatinine: 1.84 mg/dL — ABNORMAL HIGH (ref 0.60–1.30)
EGFR (Non-African Amer.): 34 — ABNORMAL LOW
GFR CALC AF AMER: 40 — AB
Glucose: 93 mg/dL (ref 65–99)
OSMOLALITY: 282 (ref 275–301)
POTASSIUM: 4.4 mmol/L (ref 3.5–5.1)
Sodium: 139 mmol/L (ref 136–145)

## 2013-09-30 LAB — MDC_IDC_ENUM_SESS_TYPE_INCLINIC
Battery Remaining Longevity: 38 mo
Battery Voltage: 2.75 V
Brady Statistic RV Percent Paced: 53 %
Lead Channel Impedance Value: 441 Ohm
Lead Channel Sensing Intrinsic Amplitude: 15.67 mV
Lead Channel Setting Pacing Amplitude: 2.5 V
Lead Channel Setting Pacing Pulse Width: 0.4 ms
MDC IDC MSMT BATTERY IMPEDANCE: 1892 Ohm
MDC IDC MSMT LEADCHNL RA IMPEDANCE VALUE: 0 Ohm
MDC IDC MSMT LEADCHNL RV PACING THRESHOLD AMPLITUDE: 0.75 V
MDC IDC MSMT LEADCHNL RV PACING THRESHOLD PULSEWIDTH: 0.4 ms
MDC IDC SESS DTM: 20150616191847
MDC IDC SET LEADCHNL RV SENSING SENSITIVITY: 5.6 mV

## 2013-09-30 NOTE — Telephone Encounter (Signed)
He still needs his stress test prior to [place] determining his surgical risk.  Provided that his stress test checks out OK, he will be able to hold it for three days prior to surgery, without having to bridge.

## 2013-09-30 NOTE — Telephone Encounter (Signed)
Spoke w/ pt.  Advised him of Chris's recommendation.  He is agreeable and will keep his appt for stress test on Wednesday.

## 2013-09-30 NOTE — Telephone Encounter (Signed)
Can he stop Pradaxa 3 days before surgery (finger surgery) on 10/08/13 with Dr. Christophe Louis

## 2013-10-01 NOTE — Telephone Encounter (Signed)
Nurse from Dr. Margaretmary Eddy is calling to see the status of pt surgery clearance. Please call.

## 2013-10-01 NOTE — Telephone Encounter (Signed)
Left message for [name] that we are waiting on results of pt's stress test sched for tomorrow before we can provide cardiac clearance.

## 2013-10-02 ENCOUNTER — Ambulatory Visit: Payer: Self-pay | Admitting: Nurse Practitioner

## 2013-10-02 DIAGNOSIS — R0989 Other specified symptoms and signs involving the circulatory and respiratory systems: Secondary | ICD-10-CM

## 2013-10-02 DIAGNOSIS — R0609 Other forms of dyspnea: Secondary | ICD-10-CM

## 2013-10-02 DIAGNOSIS — Z01810 Encounter for preprocedural cardiovascular examination: Secondary | ICD-10-CM

## 2013-10-03 ENCOUNTER — Other Ambulatory Visit: Payer: Self-pay

## 2013-10-03 DIAGNOSIS — I5032 Chronic diastolic (congestive) heart failure: Secondary | ICD-10-CM

## 2013-10-03 DIAGNOSIS — I2510 Atherosclerotic heart disease of native coronary artery without angina pectoris: Secondary | ICD-10-CM

## 2013-10-03 DIAGNOSIS — I4891 Unspecified atrial fibrillation: Secondary | ICD-10-CM

## 2013-10-03 DIAGNOSIS — Z01818 Encounter for other preprocedural examination: Secondary | ICD-10-CM

## 2013-10-03 DIAGNOSIS — I482 Chronic atrial fibrillation, unspecified: Secondary | ICD-10-CM

## 2013-10-04 ENCOUNTER — Telehealth: Payer: Self-pay | Admitting: Cardiology

## 2013-10-04 NOTE — Telephone Encounter (Signed)
Pt called and stated that he received a letter about missed transmission. Pt had device interrogated on 09-24-13. Pt informed to disregard letter. Pt verbalized understanding.

## 2013-10-08 ENCOUNTER — Ambulatory Visit: Payer: Self-pay | Admitting: Specialist

## 2013-10-10 LAB — PATHOLOGY REPORT

## 2013-10-23 ENCOUNTER — Encounter: Payer: Self-pay | Admitting: Internal Medicine

## 2013-11-06 ENCOUNTER — Telehealth: Payer: Self-pay

## 2013-11-06 NOTE — Telephone Encounter (Signed)
Patient notified samples available of crestor 5 mg and pradaxa 150 mg

## 2013-11-06 NOTE — Telephone Encounter (Signed)
Pt would like Pradaxa and Crestor samples. Please call.

## 2013-11-13 ENCOUNTER — Ambulatory Visit (INDEPENDENT_AMBULATORY_CARE_PROVIDER_SITE_OTHER): Payer: Commercial Managed Care - HMO | Admitting: Cardiovascular Disease

## 2013-11-13 ENCOUNTER — Encounter: Payer: Self-pay | Admitting: Cardiovascular Disease

## 2013-11-13 VITALS — BP 124/82 | HR 69 | Ht 72.0 in | Wt 234.5 lb

## 2013-11-13 DIAGNOSIS — I4891 Unspecified atrial fibrillation: Secondary | ICD-10-CM

## 2013-11-13 DIAGNOSIS — I482 Chronic atrial fibrillation, unspecified: Secondary | ICD-10-CM

## 2013-11-13 DIAGNOSIS — I509 Heart failure, unspecified: Secondary | ICD-10-CM

## 2013-11-13 DIAGNOSIS — I5032 Chronic diastolic (congestive) heart failure: Secondary | ICD-10-CM

## 2013-11-13 DIAGNOSIS — E785 Hyperlipidemia, unspecified: Secondary | ICD-10-CM

## 2013-11-13 DIAGNOSIS — I2510 Atherosclerotic heart disease of native coronary artery without angina pectoris: Secondary | ICD-10-CM

## 2013-11-13 MED ORDER — SIMVASTATIN 20 MG PO TABS: 20.0000 mg | ORAL_TABLET | Freq: Every day | ORAL | Status: DC

## 2013-11-13 NOTE — Assessment & Plan Note (Signed)
No recent lab work. He reports that he ate breakfast today. We will try to perform lipids on followup visit

## 2013-11-13 NOTE — Assessment & Plan Note (Signed)
Tolerating anticoagulation. No recent falls 

## 2013-11-13 NOTE — Progress Notes (Signed)
Patient ID: [name], male    DOB: [birth year], 80 y.o.   MRN: [id number]  HPI Comments: 80 year old gentleman with a history of coronary artery disease, bypass surgery in 1999 with catheterization in 2007 with patent LIMA to the LAD, occluded vein graft to the OM, history of Cypher stent to the PL branch and mid RCA in September 07, ejection fraction 45-50%, chronic atrial fibrillation with [employer] pacemaker, hx of previous recurrent falls and injury from alcohol abuse (stopped drinking more recently), Pacemaker was placed by [facility] in 2010 for syncope, AFib with pauses. He presents for routine followup. 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  In followup today, he reports that he is doing well. He denies having any significant shortness of breath, no leg edema. He has not been taking torsemide on a regular basis as his weight has been stable. Review of his weight over the past several months shows a weight decline from 243 pounds down to 239 pounds on his last clinic visit now down to 234.8 pounds. On high-dose diuretics in the past, renal function is getting worse.  Previous hospitalization for malaise, dehydration, creatinine greater than 3. Lisinopril and Lasix was discontinued.   Previous echocardiogram  showed mild to moderate pulmonary hypertension. Creatinine of 2.  He started taking Lasix 40 mg total, 20 mg twice a day with minimal change in his weight. He reports when he came home from the hospital, his weight was in the 137 range   he is bothered by polyuria. He's had incontinence episodes  he continues to drink significant fluids at home  History of positive sleep study, was on CPAP for several months. Recent change to his insurance now has no coverage  Previous lab work from 10/23/2012 shows total cholesterol 212, LDL 142, HDL 50, creatinine 1.7, BUN 22 He is status post right knee replacement.   EKG shows  atrial fibrillation , rate 69 beats per minute, paced beats   Past Medical History   .  Chronic atrial fibrillation         a. on pradaxa   .  Coronary artery disease         a. s/p MI x 3;  b. s/p CABG in 1999;  c. Cath 2007: LIMA->LAD patent, VG->OM 100, PCI/DES of RPL/mid RCA (cypher DES).   .  Chronic obstructive pulmonary disease     .  GERD (gastroesophageal reflux disease)     .  Hyperlipidemia     .  Hypertension     .  PVD (peripheral vascular disease)     .  AAA (abdominal aortic aneurysm)         a. s/p repair   .  Chronic diastolic CHF (congestive heart failure)         a. 01/2012 Echo:  EF 55%.   .  Tachy-brady syndrome         a. s/p PPM 2010.   [name]  CKD (chronic kidney disease), stage III         a. Acute on chronic 03/2012   .  Cancer         skin cancer nose      Outpatient Encounter Prescriptions as of 11/13/2013  Medication Sig  . amLODipine (NORVASC) 5 MG tablet Take 1 tablet (5 mg total) by mouth daily.  . dabigatran (PRADAXA) 150 MG CAPS capsule Take 1 capsule (150 mg total) by mouth every 12 (twelve) hours.  [name]  metoprolol succinate (TOPROL-XL) 100 MG 24 hr tablet Take 1 tablet (100 mg total) by mouth daily.  [name] omeprazole (PRILOSEC) 20 MG capsule Take 20 mg by mouth daily.    . rosuvastatin (CRESTOR) 5 MG tablet Take 5 mg by mouth daily.  . Tamsulosin HCl (FLOMAX) 0.4 MG CAPS Take 0.4 mg by mouth daily after supper.  . torsemide (DEMADEX) 20 MG tablet Take 20 mg by mouth daily as needed. He is not taking this on a regular basis     Review of Systems  Constitutional: Negative.   HENT: Negative.   Eyes: Negative.   Respiratory: Negative.   Cardiovascular: Negative.   Gastrointestinal: Negative.   Endocrine: Negative.   Musculoskeletal: Positive for gait problem.  Skin: Negative.   Allergic/Immunologic: Negative.   Neurological: Negative.   Hematological: Negative.   Psychiatric/Behavioral: Negative.   All other systems reviewed and are  negative.   BP 124/82  Ht 6' (1.829 m)  Wt 234 lb 8 oz (106.369 kg)  BMI 31.80 kg/m2  Physical Exam  Nursing note and vitals reviewed. Constitutional: He is oriented to person, place, and time. He appears well-developed and well-nourished.  HENT:  Head: Normocephalic.  Nose: Nose normal.  Mouth/Throat: Oropharynx is clear and moist.  Eyes: Conjunctivae are normal. Pupils are equal, round, and reactive to light.  Neck: Normal range of motion. Neck supple. No JVD present.  Cardiovascular: Normal rate, regular rhythm, S1 normal, S2 normal, normal heart sounds and intact distal pulses.  Exam reveals no gallop and no friction rub.   No murmur heard. Pulmonary/Chest: Effort normal and breath sounds normal. No respiratory distress. He has no wheezes. He has no rales. He exhibits no tenderness.  Abdominal: Soft. Bowel sounds are normal. He exhibits no distension. There is no tenderness.  Musculoskeletal: Normal range of motion. He exhibits no edema and no tenderness.  Lymphadenopathy:    He has no cervical adenopathy.  Neurological: He is alert and oriented to person, place, and time. Coordination normal.  Skin: Skin is warm and dry. No rash noted. No erythema.  Psychiatric: He has a normal mood and affect. His behavior is normal. Judgment and thought content normal.      Assessment and Plan

## 2013-11-13 NOTE — Assessment & Plan Note (Signed)
He reports that his weight is stable. In fact his weight has been dropping over the past several months. He has been taking torsemide only sporadically. No recent clinical signs of heart failure. No medication changes made.

## 2013-11-13 NOTE — Patient Instructions (Signed)
You are doing well.  When you run out of crestor, Change to generic simvastatin one a day  Please call us if you have new issues that need to be addressed before your next appt.  Your physician wants you to follow-up in: 6 months.  You will receive a reminder letter in the mail two months in advance. If you don't receive a letter, please call our office to schedule the follow-up appointment.

## 2013-11-13 NOTE — Assessment & Plan Note (Signed)
Currently with no symptoms of angina. No further workup at this time. Continue current medication regimen. 

## 2014-01-08 ENCOUNTER — Telehealth: Payer: Self-pay

## 2014-01-08 NOTE — Telephone Encounter (Signed)
Pradaxa 150 mg samples @ front desk for pick up.

## 2014-01-08 NOTE — Telephone Encounter (Signed)
Pt would like Pradaxa samples 

## 2014-03-17 ENCOUNTER — Other Ambulatory Visit: Payer: Self-pay | Admitting: Cardiovascular Disease

## 2014-04-14 DIAGNOSIS — J449 Chronic obstructive pulmonary disease, unspecified: Secondary | ICD-10-CM | POA: Diagnosis not present

## 2014-04-14 DIAGNOSIS — N289 Disorder of kidney and ureter, unspecified: Secondary | ICD-10-CM | POA: Diagnosis not present

## 2014-04-14 DIAGNOSIS — I10 Essential (primary) hypertension: Secondary | ICD-10-CM | POA: Diagnosis not present

## 2014-04-14 DIAGNOSIS — E785 Hyperlipidemia, unspecified: Secondary | ICD-10-CM | POA: Diagnosis not present

## 2014-04-15 DIAGNOSIS — N390 Urinary tract infection, site not specified: Secondary | ICD-10-CM | POA: Diagnosis not present

## 2014-04-27 DIAGNOSIS — S82891A Other fracture of right lower leg, initial encounter for closed fracture: Secondary | ICD-10-CM | POA: Diagnosis not present

## 2014-04-27 DIAGNOSIS — S82851A Displaced trimalleolar fracture of right lower leg, initial encounter for closed fracture: Secondary | ICD-10-CM | POA: Diagnosis not present

## 2014-04-27 DIAGNOSIS — W010XXA Fall on same level from slipping, tripping and stumbling without subsequent striking against object, initial encounter: Secondary | ICD-10-CM | POA: Diagnosis not present

## 2014-04-27 DIAGNOSIS — S82831D Other fracture of upper and lower end of right fibula, subsequent encounter for closed fracture with routine healing: Secondary | ICD-10-CM | POA: Diagnosis not present

## 2014-04-27 DIAGNOSIS — R9431 Abnormal electrocardiogram [ECG] [EKG]: Secondary | ICD-10-CM | POA: Diagnosis not present

## 2014-04-27 DIAGNOSIS — S82831A Other fracture of upper and lower end of right fibula, initial encounter for closed fracture: Secondary | ICD-10-CM | POA: Diagnosis not present

## 2014-04-27 DIAGNOSIS — I5022 Chronic systolic (congestive) heart failure: Secondary | ICD-10-CM | POA: Diagnosis not present

## 2014-04-27 DIAGNOSIS — S8251XD Displaced fracture of medial malleolus of right tibia, subsequent encounter for closed fracture with routine healing: Secondary | ICD-10-CM | POA: Diagnosis not present

## 2014-04-27 DIAGNOSIS — I252 Old myocardial infarction: Secondary | ICD-10-CM | POA: Diagnosis not present

## 2014-04-27 DIAGNOSIS — I129 Hypertensive chronic kidney disease with stage 1 through stage 4 chronic kidney disease, or unspecified chronic kidney disease: Secondary | ICD-10-CM | POA: Diagnosis not present

## 2014-04-27 DIAGNOSIS — I480 Paroxysmal atrial fibrillation: Secondary | ICD-10-CM | POA: Diagnosis not present

## 2014-04-27 DIAGNOSIS — I2510 Atherosclerotic heart disease of native coronary artery without angina pectoris: Secondary | ICD-10-CM | POA: Diagnosis not present

## 2014-04-27 DIAGNOSIS — S82409A Unspecified fracture of shaft of unspecified fibula, initial encounter for closed fracture: Secondary | ICD-10-CM | POA: Diagnosis not present

## 2014-04-27 DIAGNOSIS — S82401A Unspecified fracture of shaft of right fibula, initial encounter for closed fracture: Secondary | ICD-10-CM | POA: Diagnosis not present

## 2014-04-27 DIAGNOSIS — S8251XA Displaced fracture of medial malleolus of right tibia, initial encounter for closed fracture: Secondary | ICD-10-CM | POA: Diagnosis not present

## 2014-04-27 LAB — PROTIME-INR
INR: 1.3
Prothrombin Time: 16 secs — ABNORMAL HIGH (ref 11.5–14.7)

## 2014-04-27 LAB — COMPREHENSIVE METABOLIC PANEL
Albumin: 3.2 g/dL — ABNORMAL LOW (ref 3.4–5.0)
Alkaline Phosphatase: 78 U/L
Anion Gap: 8 (ref 7–16)
BILIRUBIN TOTAL: 0.5 mg/dL (ref 0.2–1.0)
BUN: 21 mg/dL — AB (ref 7–18)
CO2: 23 mmol/L (ref 21–32)
Calcium, Total: 8.7 mg/dL (ref 8.5–10.1)
Chloride: 110 mmol/L — ABNORMAL HIGH (ref 98–107)
Creatinine: 1.83 mg/dL — ABNORMAL HIGH (ref 0.60–1.30)
GFR CALC AF AMER: 46 — AB
GFR CALC NON AF AMER: 38 — AB
Glucose: 93 mg/dL (ref 65–99)
Osmolality: 284 (ref 275–301)
POTASSIUM: 4.9 mmol/L (ref 3.5–5.1)
SGOT(AST): 36 U/L (ref 15–37)
SGPT (ALT): 27 U/L
Sodium: 141 mmol/L (ref 136–145)
Total Protein: 7.3 g/dL (ref 6.4–8.2)

## 2014-04-27 LAB — CBC
HCT: 39.8 % — AB (ref 40.0–52.0)
HGB: 12.9 g/dL — ABNORMAL LOW (ref 13.0–18.0)
MCH: 30.3 pg (ref 26.0–34.0)
MCHC: 32.3 g/dL (ref 32.0–36.0)
MCV: 94 fL (ref 80–100)
Platelet: 175 10*3/uL (ref 150–440)
RBC: 4.25 10*6/uL — ABNORMAL LOW (ref 4.40–5.90)
RDW: 14 % (ref 11.5–14.5)
WBC: 7.1 10*3/uL (ref 3.8–10.6)

## 2014-04-27 LAB — APTT: Activated PTT: 43.7 secs — ABNORMAL HIGH (ref 23.6–35.9)

## 2014-04-28 ENCOUNTER — Inpatient Hospital Stay: Payer: Self-pay | Admitting: Internal Medicine

## 2014-04-28 DIAGNOSIS — N183 Chronic kidney disease, stage 3 (moderate): Secondary | ICD-10-CM | POA: Diagnosis not present

## 2014-04-28 DIAGNOSIS — R9431 Abnormal electrocardiogram [ECG] [EKG]: Secondary | ICD-10-CM | POA: Diagnosis not present

## 2014-04-28 DIAGNOSIS — S82851A Displaced trimalleolar fracture of right lower leg, initial encounter for closed fracture: Secondary | ICD-10-CM | POA: Diagnosis not present

## 2014-04-28 DIAGNOSIS — Z951 Presence of aortocoronary bypass graft: Secondary | ICD-10-CM | POA: Diagnosis not present

## 2014-04-28 DIAGNOSIS — I5022 Chronic systolic (congestive) heart failure: Secondary | ICD-10-CM | POA: Diagnosis not present

## 2014-04-28 DIAGNOSIS — R531 Weakness: Secondary | ICD-10-CM | POA: Diagnosis not present

## 2014-04-28 DIAGNOSIS — N179 Acute kidney failure, unspecified: Secondary | ICD-10-CM | POA: Diagnosis not present

## 2014-04-28 DIAGNOSIS — S82831A Other fracture of upper and lower end of right fibula, initial encounter for closed fracture: Secondary | ICD-10-CM | POA: Diagnosis not present

## 2014-04-28 DIAGNOSIS — J449 Chronic obstructive pulmonary disease, unspecified: Secondary | ICD-10-CM | POA: Diagnosis not present

## 2014-04-28 DIAGNOSIS — I129 Hypertensive chronic kidney disease with stage 1 through stage 4 chronic kidney disease, or unspecified chronic kidney disease: Secondary | ICD-10-CM | POA: Diagnosis not present

## 2014-04-28 DIAGNOSIS — S8251XA Displaced fracture of medial malleolus of right tibia, initial encounter for closed fracture: Secondary | ICD-10-CM | POA: Diagnosis not present

## 2014-04-28 DIAGNOSIS — E785 Hyperlipidemia, unspecified: Secondary | ICD-10-CM | POA: Diagnosis not present

## 2014-04-28 DIAGNOSIS — I10 Essential (primary) hypertension: Secondary | ICD-10-CM | POA: Diagnosis not present

## 2014-04-28 DIAGNOSIS — Z01818 Encounter for other preprocedural examination: Secondary | ICD-10-CM | POA: Diagnosis not present

## 2014-04-28 DIAGNOSIS — S82891A Other fracture of right lower leg, initial encounter for closed fracture: Secondary | ICD-10-CM | POA: Diagnosis not present

## 2014-04-28 DIAGNOSIS — Z950 Presence of cardiac pacemaker: Secondary | ICD-10-CM | POA: Diagnosis not present

## 2014-04-28 DIAGNOSIS — R0602 Shortness of breath: Secondary | ICD-10-CM | POA: Diagnosis not present

## 2014-04-28 DIAGNOSIS — J929 Pleural plaque without asbestos: Secondary | ICD-10-CM | POA: Diagnosis not present

## 2014-04-28 DIAGNOSIS — I252 Old myocardial infarction: Secondary | ICD-10-CM | POA: Diagnosis not present

## 2014-04-28 DIAGNOSIS — W010XXA Fall on same level from slipping, tripping and stumbling without subsequent striking against object, initial encounter: Secondary | ICD-10-CM | POA: Diagnosis not present

## 2014-04-28 DIAGNOSIS — Z7901 Long term (current) use of anticoagulants: Secondary | ICD-10-CM | POA: Diagnosis not present

## 2014-04-28 DIAGNOSIS — I517 Cardiomegaly: Secondary | ICD-10-CM | POA: Diagnosis not present

## 2014-04-28 DIAGNOSIS — Z9181 History of falling: Secondary | ICD-10-CM | POA: Diagnosis not present

## 2014-04-28 DIAGNOSIS — R2689 Other abnormalities of gait and mobility: Secondary | ICD-10-CM | POA: Diagnosis not present

## 2014-04-28 DIAGNOSIS — N289 Disorder of kidney and ureter, unspecified: Secondary | ICD-10-CM | POA: Diagnosis not present

## 2014-04-28 DIAGNOSIS — I5032 Chronic diastolic (congestive) heart failure: Secondary | ICD-10-CM | POA: Diagnosis not present

## 2014-04-28 DIAGNOSIS — I480 Paroxysmal atrial fibrillation: Secondary | ICD-10-CM | POA: Diagnosis not present

## 2014-04-28 DIAGNOSIS — M80071D Age-related osteoporosis with current pathological fracture, right ankle and foot, subsequent encounter for fracture with routine healing: Secondary | ICD-10-CM | POA: Diagnosis not present

## 2014-04-28 DIAGNOSIS — S82409A Unspecified fracture of shaft of unspecified fibula, initial encounter for closed fracture: Secondary | ICD-10-CM | POA: Diagnosis not present

## 2014-04-28 DIAGNOSIS — Z9981 Dependence on supplemental oxygen: Secondary | ICD-10-CM | POA: Diagnosis not present

## 2014-04-28 DIAGNOSIS — M6281 Muscle weakness (generalized): Secondary | ICD-10-CM | POA: Diagnosis not present

## 2014-04-28 LAB — CBC WITH DIFFERENTIAL/PLATELET
BASOS PCT: 0.8 %
Basophil #: 0.1 10*3/uL (ref 0.0–0.1)
Eosinophil #: 0.2 10*3/uL (ref 0.0–0.7)
Eosinophil %: 2.6 %
HCT: 34.9 % — AB (ref 40.0–52.0)
HGB: 11.2 g/dL — AB (ref 13.0–18.0)
Lymphocyte #: 1.2 10*3/uL (ref 1.0–3.6)
Lymphocyte %: 14.9 %
MCH: 30.2 pg (ref 26.0–34.0)
MCHC: 32 g/dL (ref 32.0–36.0)
MCV: 94 fL (ref 80–100)
Monocyte #: 0.9 x10 3/mm (ref 0.2–1.0)
Monocyte %: 11 %
NEUTROS ABS: 5.6 10*3/uL (ref 1.4–6.5)
Neutrophil %: 70.7 %
Platelet: 147 10*3/uL — ABNORMAL LOW (ref 150–440)
RBC: 3.7 10*6/uL — ABNORMAL LOW (ref 4.40–5.90)
RDW: 14.4 % (ref 11.5–14.5)
WBC: 7.9 10*3/uL (ref 3.8–10.6)

## 2014-04-28 LAB — BASIC METABOLIC PANEL
ANION GAP: 6 — AB (ref 7–16)
BUN: 16 mg/dL (ref 7–18)
CHLORIDE: 108 mmol/L — AB (ref 98–107)
CREATININE: 1.84 mg/dL — AB (ref 0.60–1.30)
Calcium, Total: 7.9 mg/dL — ABNORMAL LOW (ref 8.5–10.1)
Co2: 25 mmol/L (ref 21–32)
EGFR (African American): 46 — ABNORMAL LOW
EGFR (Non-African Amer.): 38 — ABNORMAL LOW
Glucose: 102 mg/dL — ABNORMAL HIGH (ref 65–99)
Osmolality: 279 (ref 275–301)
Potassium: 4.5 mmol/L (ref 3.5–5.1)
SODIUM: 139 mmol/L (ref 136–145)

## 2014-04-29 LAB — BASIC METABOLIC PANEL
ANION GAP: 5 — AB (ref 7–16)
Anion Gap: 8 (ref 7–16)
BUN: 23 mg/dL — ABNORMAL HIGH (ref 7–18)
BUN: 25 mg/dL — AB (ref 7–18)
CALCIUM: 8.4 mg/dL — AB (ref 8.5–10.1)
CALCIUM: 8.4 mg/dL — AB (ref 8.5–10.1)
CHLORIDE: 102 mmol/L (ref 98–107)
CHLORIDE: 102 mmol/L (ref 98–107)
CO2: 28 mmol/L (ref 21–32)
CO2: 29 mmol/L (ref 21–32)
Creatinine: 2.51 mg/dL — ABNORMAL HIGH (ref 0.60–1.30)
Creatinine: 2.55 mg/dL — ABNORMAL HIGH (ref 0.60–1.30)
EGFR (African American): 32 — ABNORMAL LOW
EGFR (Non-African Amer.): 27 — ABNORMAL LOW
GFR CALC AF AMER: 32 — AB
GFR CALC NON AF AMER: 26 — AB
Glucose: 96 mg/dL (ref 65–99)
Glucose: 113 mg/dL — ABNORMAL HIGH (ref 65–99)
OSMOLALITY: 277 (ref 275–301)
Osmolality: 279 (ref 275–301)
POTASSIUM: 4.1 mmol/L (ref 3.5–5.1)
POTASSIUM: 4.2 mmol/L (ref 3.5–5.1)
SODIUM: 138 mmol/L (ref 136–145)
Sodium: 136 mmol/L (ref 136–145)

## 2014-04-30 LAB — URINALYSIS, COMPLETE
BACTERIA: NONE SEEN
BILIRUBIN, UR: NEGATIVE
Glucose,UR: NEGATIVE mg/dL (ref 0–75)
Ketone: NEGATIVE
LEUKOCYTE ESTERASE: NEGATIVE
Nitrite: NEGATIVE
PH: 5 (ref 4.5–8.0)
Protein: 30
Specific Gravity: 1.012 (ref 1.003–1.030)

## 2014-04-30 LAB — PROTEIN / CREATININE RATIO, URINE
Creatinine, Urine: 142.2 mg/dL — ABNORMAL HIGH (ref 30.0–125.0)
PROTEIN, RANDOM URINE: 75 mg/dL — AB (ref 0–12)
Protein/Creat. Ratio: 527 mg/gCREAT — ABNORMAL HIGH (ref 0–200)

## 2014-04-30 LAB — BASIC METABOLIC PANEL
ANION GAP: 7 (ref 7–16)
BUN: 30 mg/dL — ABNORMAL HIGH (ref 7–18)
CALCIUM: 8.7 mg/dL (ref 8.5–10.1)
Chloride: 100 mmol/L (ref 98–107)
Co2: 30 mmol/L (ref 21–32)
Creatinine: 2.99 mg/dL — ABNORMAL HIGH (ref 0.60–1.30)
EGFR (Non-African Amer.): 22 — ABNORMAL LOW
GFR CALC AF AMER: 26 — AB
Glucose: 96 mg/dL (ref 65–99)
Osmolality: 280 (ref 275–301)
Potassium: 4.1 mmol/L (ref 3.5–5.1)
SODIUM: 137 mmol/L (ref 136–145)

## 2014-05-01 DIAGNOSIS — I5032 Chronic diastolic (congestive) heart failure: Secondary | ICD-10-CM | POA: Diagnosis not present

## 2014-05-01 DIAGNOSIS — I509 Heart failure, unspecified: Secondary | ICD-10-CM | POA: Diagnosis not present

## 2014-05-01 DIAGNOSIS — Z7901 Long term (current) use of anticoagulants: Secondary | ICD-10-CM | POA: Diagnosis not present

## 2014-05-01 DIAGNOSIS — R0602 Shortness of breath: Secondary | ICD-10-CM | POA: Diagnosis not present

## 2014-05-01 DIAGNOSIS — M25571 Pain in right ankle and joints of right foot: Secondary | ICD-10-CM | POA: Diagnosis not present

## 2014-05-01 DIAGNOSIS — M810 Age-related osteoporosis without current pathological fracture: Secondary | ICD-10-CM | POA: Diagnosis not present

## 2014-05-01 DIAGNOSIS — I4891 Unspecified atrial fibrillation: Secondary | ICD-10-CM | POA: Diagnosis not present

## 2014-05-01 DIAGNOSIS — N183 Chronic kidney disease, stage 3 (moderate): Secondary | ICD-10-CM | POA: Diagnosis not present

## 2014-05-01 DIAGNOSIS — J449 Chronic obstructive pulmonary disease, unspecified: Secondary | ICD-10-CM | POA: Diagnosis not present

## 2014-05-01 DIAGNOSIS — R531 Weakness: Secondary | ICD-10-CM | POA: Diagnosis not present

## 2014-05-01 DIAGNOSIS — Z9181 History of falling: Secondary | ICD-10-CM | POA: Diagnosis not present

## 2014-05-01 DIAGNOSIS — Z8781 Personal history of (healed) traumatic fracture: Secondary | ICD-10-CM | POA: Diagnosis not present

## 2014-05-01 DIAGNOSIS — Z9981 Dependence on supplemental oxygen: Secondary | ICD-10-CM | POA: Diagnosis not present

## 2014-05-01 DIAGNOSIS — I10 Essential (primary) hypertension: Secondary | ICD-10-CM | POA: Diagnosis not present

## 2014-05-01 DIAGNOSIS — N179 Acute kidney failure, unspecified: Secondary | ICD-10-CM | POA: Diagnosis not present

## 2014-05-01 DIAGNOSIS — M80071D Age-related osteoporosis with current pathological fracture, right ankle and foot, subsequent encounter for fracture with routine healing: Secondary | ICD-10-CM | POA: Diagnosis not present

## 2014-05-01 DIAGNOSIS — E875 Hyperkalemia: Secondary | ICD-10-CM | POA: Diagnosis not present

## 2014-05-01 DIAGNOSIS — R2689 Other abnormalities of gait and mobility: Secondary | ICD-10-CM | POA: Diagnosis not present

## 2014-05-01 DIAGNOSIS — S82854A Nondisplaced trimalleolar fracture of right lower leg, initial encounter for closed fracture: Secondary | ICD-10-CM | POA: Diagnosis not present

## 2014-05-01 DIAGNOSIS — J441 Chronic obstructive pulmonary disease with (acute) exacerbation: Secondary | ICD-10-CM | POA: Diagnosis not present

## 2014-05-01 DIAGNOSIS — Z950 Presence of cardiac pacemaker: Secondary | ICD-10-CM | POA: Diagnosis not present

## 2014-05-01 DIAGNOSIS — Z967 Presence of other bone and tendon implants: Secondary | ICD-10-CM | POA: Diagnosis not present

## 2014-05-01 DIAGNOSIS — I480 Paroxysmal atrial fibrillation: Secondary | ICD-10-CM | POA: Diagnosis not present

## 2014-05-01 DIAGNOSIS — M6281 Muscle weakness (generalized): Secondary | ICD-10-CM | POA: Diagnosis not present

## 2014-05-01 LAB — BASIC METABOLIC PANEL
Anion Gap: 6 — ABNORMAL LOW (ref 7–16)
BUN: 42 mg/dL — ABNORMAL HIGH (ref 7–18)
CALCIUM: 8.6 mg/dL (ref 8.5–10.1)
CHLORIDE: 104 mmol/L (ref 98–107)
Co2: 28 mmol/L (ref 21–32)
Creatinine: 2.65 mg/dL — ABNORMAL HIGH (ref 0.60–1.30)
EGFR (African American): 30 — ABNORMAL LOW
EGFR (Non-African Amer.): 25 — ABNORMAL LOW
Glucose: 109 mg/dL — ABNORMAL HIGH (ref 65–99)
Osmolality: 287 (ref 275–301)
POTASSIUM: 4 mmol/L (ref 3.5–5.1)
Sodium: 138 mmol/L (ref 136–145)

## 2014-05-02 ENCOUNTER — Encounter: Payer: Self-pay | Admitting: Internal Medicine

## 2014-05-02 DIAGNOSIS — I4891 Unspecified atrial fibrillation: Secondary | ICD-10-CM | POA: Diagnosis not present

## 2014-05-02 DIAGNOSIS — M810 Age-related osteoporosis without current pathological fracture: Secondary | ICD-10-CM | POA: Diagnosis not present

## 2014-05-02 DIAGNOSIS — J441 Chronic obstructive pulmonary disease with (acute) exacerbation: Secondary | ICD-10-CM | POA: Diagnosis not present

## 2014-05-02 DIAGNOSIS — I509 Heart failure, unspecified: Secondary | ICD-10-CM | POA: Diagnosis not present

## 2014-05-08 DIAGNOSIS — Z967 Presence of other bone and tendon implants: Secondary | ICD-10-CM | POA: Diagnosis not present

## 2014-05-08 DIAGNOSIS — M25571 Pain in right ankle and joints of right foot: Secondary | ICD-10-CM | POA: Diagnosis not present

## 2014-05-08 DIAGNOSIS — S82854A Nondisplaced trimalleolar fracture of right lower leg, initial encounter for closed fracture: Secondary | ICD-10-CM | POA: Diagnosis not present

## 2014-05-08 DIAGNOSIS — Z8781 Personal history of (healed) traumatic fracture: Secondary | ICD-10-CM | POA: Diagnosis not present

## 2014-05-12 ENCOUNTER — Ambulatory Visit: Payer: Commercial Managed Care - HMO | Admitting: Cardiovascular Disease

## 2014-05-12 ENCOUNTER — Encounter: Payer: Self-pay | Admitting: Internal Medicine

## 2014-05-15 DIAGNOSIS — I509 Heart failure, unspecified: Secondary | ICD-10-CM | POA: Diagnosis not present

## 2014-05-15 DIAGNOSIS — N183 Chronic kidney disease, stage 3 (moderate): Secondary | ICD-10-CM | POA: Diagnosis not present

## 2014-05-15 DIAGNOSIS — I10 Essential (primary) hypertension: Secondary | ICD-10-CM | POA: Diagnosis not present

## 2014-05-15 DIAGNOSIS — N179 Acute kidney failure, unspecified: Secondary | ICD-10-CM | POA: Diagnosis not present

## 2014-05-15 DIAGNOSIS — E875 Hyperkalemia: Secondary | ICD-10-CM | POA: Diagnosis not present

## 2014-05-20 ENCOUNTER — Encounter: Payer: Commercial Managed Care - HMO | Admitting: Internal Medicine

## 2014-05-22 DIAGNOSIS — J449 Chronic obstructive pulmonary disease, unspecified: Secondary | ICD-10-CM | POA: Diagnosis not present

## 2014-05-22 DIAGNOSIS — N183 Chronic kidney disease, stage 3 (moderate): Secondary | ICD-10-CM | POA: Diagnosis not present

## 2014-05-22 DIAGNOSIS — I480 Paroxysmal atrial fibrillation: Secondary | ICD-10-CM | POA: Diagnosis not present

## 2014-05-22 DIAGNOSIS — Z9181 History of falling: Secondary | ICD-10-CM | POA: Diagnosis not present

## 2014-05-22 DIAGNOSIS — Z9981 Dependence on supplemental oxygen: Secondary | ICD-10-CM | POA: Diagnosis not present

## 2014-05-22 DIAGNOSIS — I129 Hypertensive chronic kidney disease with stage 1 through stage 4 chronic kidney disease, or unspecified chronic kidney disease: Secondary | ICD-10-CM | POA: Diagnosis not present

## 2014-05-22 DIAGNOSIS — S8251XD Displaced fracture of medial malleolus of right tibia, subsequent encounter for closed fracture with routine healing: Secondary | ICD-10-CM | POA: Diagnosis not present

## 2014-05-22 DIAGNOSIS — I5042 Chronic combined systolic (congestive) and diastolic (congestive) heart failure: Secondary | ICD-10-CM | POA: Diagnosis not present

## 2014-05-22 DIAGNOSIS — Z7901 Long term (current) use of anticoagulants: Secondary | ICD-10-CM | POA: Diagnosis not present

## 2014-05-28 DIAGNOSIS — I480 Paroxysmal atrial fibrillation: Secondary | ICD-10-CM | POA: Diagnosis not present

## 2014-05-28 DIAGNOSIS — I129 Hypertensive chronic kidney disease with stage 1 through stage 4 chronic kidney disease, or unspecified chronic kidney disease: Secondary | ICD-10-CM | POA: Diagnosis not present

## 2014-05-28 DIAGNOSIS — I5042 Chronic combined systolic (congestive) and diastolic (congestive) heart failure: Secondary | ICD-10-CM | POA: Diagnosis not present

## 2014-05-28 DIAGNOSIS — J449 Chronic obstructive pulmonary disease, unspecified: Secondary | ICD-10-CM | POA: Diagnosis not present

## 2014-05-28 DIAGNOSIS — Z9181 History of falling: Secondary | ICD-10-CM | POA: Diagnosis not present

## 2014-05-28 DIAGNOSIS — S8251XD Displaced fracture of medial malleolus of right tibia, subsequent encounter for closed fracture with routine healing: Secondary | ICD-10-CM | POA: Diagnosis not present

## 2014-05-28 DIAGNOSIS — Z9981 Dependence on supplemental oxygen: Secondary | ICD-10-CM | POA: Diagnosis not present

## 2014-05-28 DIAGNOSIS — N183 Chronic kidney disease, stage 3 (moderate): Secondary | ICD-10-CM | POA: Diagnosis not present

## 2014-05-28 DIAGNOSIS — Z7901 Long term (current) use of anticoagulants: Secondary | ICD-10-CM | POA: Diagnosis not present

## 2014-05-30 DIAGNOSIS — I129 Hypertensive chronic kidney disease with stage 1 through stage 4 chronic kidney disease, or unspecified chronic kidney disease: Secondary | ICD-10-CM | POA: Diagnosis not present

## 2014-05-30 DIAGNOSIS — Z7901 Long term (current) use of anticoagulants: Secondary | ICD-10-CM | POA: Diagnosis not present

## 2014-05-30 DIAGNOSIS — I5042 Chronic combined systolic (congestive) and diastolic (congestive) heart failure: Secondary | ICD-10-CM | POA: Diagnosis not present

## 2014-05-30 DIAGNOSIS — N183 Chronic kidney disease, stage 3 (moderate): Secondary | ICD-10-CM | POA: Diagnosis not present

## 2014-05-30 DIAGNOSIS — J449 Chronic obstructive pulmonary disease, unspecified: Secondary | ICD-10-CM | POA: Diagnosis not present

## 2014-05-30 DIAGNOSIS — Z9181 History of falling: Secondary | ICD-10-CM | POA: Diagnosis not present

## 2014-05-30 DIAGNOSIS — I480 Paroxysmal atrial fibrillation: Secondary | ICD-10-CM | POA: Diagnosis not present

## 2014-05-30 DIAGNOSIS — Z9981 Dependence on supplemental oxygen: Secondary | ICD-10-CM | POA: Diagnosis not present

## 2014-05-30 DIAGNOSIS — S8251XD Displaced fracture of medial malleolus of right tibia, subsequent encounter for closed fracture with routine healing: Secondary | ICD-10-CM | POA: Diagnosis not present

## 2014-06-02 DIAGNOSIS — Z9981 Dependence on supplemental oxygen: Secondary | ICD-10-CM | POA: Diagnosis not present

## 2014-06-02 DIAGNOSIS — M25571 Pain in right ankle and joints of right foot: Secondary | ICD-10-CM | POA: Diagnosis not present

## 2014-06-02 DIAGNOSIS — S82854D Nondisplaced trimalleolar fracture of right lower leg, subsequent encounter for closed fracture with routine healing: Secondary | ICD-10-CM | POA: Diagnosis not present

## 2014-06-02 DIAGNOSIS — I5042 Chronic combined systolic (congestive) and diastolic (congestive) heart failure: Secondary | ICD-10-CM | POA: Diagnosis not present

## 2014-06-02 DIAGNOSIS — Z9181 History of falling: Secondary | ICD-10-CM | POA: Diagnosis not present

## 2014-06-02 DIAGNOSIS — I129 Hypertensive chronic kidney disease with stage 1 through stage 4 chronic kidney disease, or unspecified chronic kidney disease: Secondary | ICD-10-CM | POA: Diagnosis not present

## 2014-06-02 DIAGNOSIS — Z967 Presence of other bone and tendon implants: Secondary | ICD-10-CM | POA: Diagnosis not present

## 2014-06-02 DIAGNOSIS — S8251XD Displaced fracture of medial malleolus of right tibia, subsequent encounter for closed fracture with routine healing: Secondary | ICD-10-CM | POA: Diagnosis not present

## 2014-06-02 DIAGNOSIS — Z8781 Personal history of (healed) traumatic fracture: Secondary | ICD-10-CM | POA: Diagnosis not present

## 2014-06-02 DIAGNOSIS — I480 Paroxysmal atrial fibrillation: Secondary | ICD-10-CM | POA: Diagnosis not present

## 2014-06-02 DIAGNOSIS — Z7901 Long term (current) use of anticoagulants: Secondary | ICD-10-CM | POA: Diagnosis not present

## 2014-06-02 DIAGNOSIS — N183 Chronic kidney disease, stage 3 (moderate): Secondary | ICD-10-CM | POA: Diagnosis not present

## 2014-06-02 DIAGNOSIS — J449 Chronic obstructive pulmonary disease, unspecified: Secondary | ICD-10-CM | POA: Diagnosis not present

## 2014-06-03 DIAGNOSIS — I480 Paroxysmal atrial fibrillation: Secondary | ICD-10-CM | POA: Diagnosis not present

## 2014-06-03 DIAGNOSIS — N183 Chronic kidney disease, stage 3 (moderate): Secondary | ICD-10-CM | POA: Diagnosis not present

## 2014-06-03 DIAGNOSIS — I129 Hypertensive chronic kidney disease with stage 1 through stage 4 chronic kidney disease, or unspecified chronic kidney disease: Secondary | ICD-10-CM | POA: Diagnosis not present

## 2014-06-03 DIAGNOSIS — Z9981 Dependence on supplemental oxygen: Secondary | ICD-10-CM | POA: Diagnosis not present

## 2014-06-03 DIAGNOSIS — I5042 Chronic combined systolic (congestive) and diastolic (congestive) heart failure: Secondary | ICD-10-CM | POA: Diagnosis not present

## 2014-06-03 DIAGNOSIS — Z7901 Long term (current) use of anticoagulants: Secondary | ICD-10-CM | POA: Diagnosis not present

## 2014-06-03 DIAGNOSIS — S8251XD Displaced fracture of medial malleolus of right tibia, subsequent encounter for closed fracture with routine healing: Secondary | ICD-10-CM | POA: Diagnosis not present

## 2014-06-03 DIAGNOSIS — Z9181 History of falling: Secondary | ICD-10-CM | POA: Diagnosis not present

## 2014-06-03 DIAGNOSIS — J449 Chronic obstructive pulmonary disease, unspecified: Secondary | ICD-10-CM | POA: Diagnosis not present

## 2014-06-04 DIAGNOSIS — R05 Cough: Secondary | ICD-10-CM | POA: Diagnosis not present

## 2014-06-04 DIAGNOSIS — Z7901 Long term (current) use of anticoagulants: Secondary | ICD-10-CM | POA: Diagnosis not present

## 2014-06-04 DIAGNOSIS — I5042 Chronic combined systolic (congestive) and diastolic (congestive) heart failure: Secondary | ICD-10-CM | POA: Diagnosis not present

## 2014-06-04 DIAGNOSIS — N183 Chronic kidney disease, stage 3 (moderate): Secondary | ICD-10-CM | POA: Diagnosis not present

## 2014-06-04 DIAGNOSIS — Z9981 Dependence on supplemental oxygen: Secondary | ICD-10-CM | POA: Diagnosis not present

## 2014-06-04 DIAGNOSIS — I129 Hypertensive chronic kidney disease with stage 1 through stage 4 chronic kidney disease, or unspecified chronic kidney disease: Secondary | ICD-10-CM | POA: Diagnosis not present

## 2014-06-04 DIAGNOSIS — J40 Bronchitis, not specified as acute or chronic: Secondary | ICD-10-CM | POA: Diagnosis not present

## 2014-06-04 DIAGNOSIS — I480 Paroxysmal atrial fibrillation: Secondary | ICD-10-CM | POA: Diagnosis not present

## 2014-06-04 DIAGNOSIS — J449 Chronic obstructive pulmonary disease, unspecified: Secondary | ICD-10-CM | POA: Diagnosis not present

## 2014-06-04 DIAGNOSIS — S8251XD Displaced fracture of medial malleolus of right tibia, subsequent encounter for closed fracture with routine healing: Secondary | ICD-10-CM | POA: Diagnosis not present

## 2014-06-04 DIAGNOSIS — Z9181 History of falling: Secondary | ICD-10-CM | POA: Diagnosis not present

## 2014-06-05 DIAGNOSIS — N183 Chronic kidney disease, stage 3 (moderate): Secondary | ICD-10-CM | POA: Diagnosis not present

## 2014-06-05 DIAGNOSIS — Z9181 History of falling: Secondary | ICD-10-CM | POA: Diagnosis not present

## 2014-06-05 DIAGNOSIS — J449 Chronic obstructive pulmonary disease, unspecified: Secondary | ICD-10-CM | POA: Diagnosis not present

## 2014-06-05 DIAGNOSIS — I129 Hypertensive chronic kidney disease with stage 1 through stage 4 chronic kidney disease, or unspecified chronic kidney disease: Secondary | ICD-10-CM | POA: Diagnosis not present

## 2014-06-05 DIAGNOSIS — I5042 Chronic combined systolic (congestive) and diastolic (congestive) heart failure: Secondary | ICD-10-CM | POA: Diagnosis not present

## 2014-06-05 DIAGNOSIS — Z9981 Dependence on supplemental oxygen: Secondary | ICD-10-CM | POA: Diagnosis not present

## 2014-06-05 DIAGNOSIS — Z7901 Long term (current) use of anticoagulants: Secondary | ICD-10-CM | POA: Diagnosis not present

## 2014-06-05 DIAGNOSIS — S8251XD Displaced fracture of medial malleolus of right tibia, subsequent encounter for closed fracture with routine healing: Secondary | ICD-10-CM | POA: Diagnosis not present

## 2014-06-05 DIAGNOSIS — I480 Paroxysmal atrial fibrillation: Secondary | ICD-10-CM | POA: Diagnosis not present

## 2014-06-09 DIAGNOSIS — S8251XD Displaced fracture of medial malleolus of right tibia, subsequent encounter for closed fracture with routine healing: Secondary | ICD-10-CM | POA: Diagnosis not present

## 2014-06-09 DIAGNOSIS — Z9981 Dependence on supplemental oxygen: Secondary | ICD-10-CM | POA: Diagnosis not present

## 2014-06-09 DIAGNOSIS — I480 Paroxysmal atrial fibrillation: Secondary | ICD-10-CM | POA: Diagnosis not present

## 2014-06-09 DIAGNOSIS — J449 Chronic obstructive pulmonary disease, unspecified: Secondary | ICD-10-CM | POA: Diagnosis not present

## 2014-06-09 DIAGNOSIS — N183 Chronic kidney disease, stage 3 (moderate): Secondary | ICD-10-CM | POA: Diagnosis not present

## 2014-06-09 DIAGNOSIS — I5042 Chronic combined systolic (congestive) and diastolic (congestive) heart failure: Secondary | ICD-10-CM | POA: Diagnosis not present

## 2014-06-09 DIAGNOSIS — Z9181 History of falling: Secondary | ICD-10-CM | POA: Diagnosis not present

## 2014-06-09 DIAGNOSIS — Z7901 Long term (current) use of anticoagulants: Secondary | ICD-10-CM | POA: Diagnosis not present

## 2014-06-09 DIAGNOSIS — I129 Hypertensive chronic kidney disease with stage 1 through stage 4 chronic kidney disease, or unspecified chronic kidney disease: Secondary | ICD-10-CM | POA: Diagnosis not present

## 2014-06-10 ENCOUNTER — Encounter
Admit: 2014-06-10 | Disposition: A | Discharge status: Home / Self Care | Payer: Self-pay | Attending: Internal Medicine | Admitting: Internal Medicine

## 2014-06-11 DIAGNOSIS — Z9181 History of falling: Secondary | ICD-10-CM | POA: Diagnosis not present

## 2014-06-11 DIAGNOSIS — I129 Hypertensive chronic kidney disease with stage 1 through stage 4 chronic kidney disease, or unspecified chronic kidney disease: Secondary | ICD-10-CM | POA: Diagnosis not present

## 2014-06-11 DIAGNOSIS — Z9981 Dependence on supplemental oxygen: Secondary | ICD-10-CM | POA: Diagnosis not present

## 2014-06-11 DIAGNOSIS — S8251XD Displaced fracture of medial malleolus of right tibia, subsequent encounter for closed fracture with routine healing: Secondary | ICD-10-CM | POA: Diagnosis not present

## 2014-06-11 DIAGNOSIS — J449 Chronic obstructive pulmonary disease, unspecified: Secondary | ICD-10-CM | POA: Diagnosis not present

## 2014-06-11 DIAGNOSIS — N183 Chronic kidney disease, stage 3 (moderate): Secondary | ICD-10-CM | POA: Diagnosis not present

## 2014-06-11 DIAGNOSIS — Z7901 Long term (current) use of anticoagulants: Secondary | ICD-10-CM | POA: Diagnosis not present

## 2014-06-11 DIAGNOSIS — I5042 Chronic combined systolic (congestive) and diastolic (congestive) heart failure: Secondary | ICD-10-CM | POA: Diagnosis not present

## 2014-06-11 DIAGNOSIS — I480 Paroxysmal atrial fibrillation: Secondary | ICD-10-CM | POA: Diagnosis not present

## 2014-06-17 ENCOUNTER — Telehealth: Payer: Self-pay | Admitting: *Deleted

## 2014-06-17 NOTE — Telephone Encounter (Signed)
Taking 2 a paradoxa   Got it refilled and  It was 97 today.  For years he would get it for 69.   Question is can he take one a day instead of the two  or can he be put on something cheaper?   Please advise.

## 2014-06-18 DIAGNOSIS — J449 Chronic obstructive pulmonary disease, unspecified: Secondary | ICD-10-CM | POA: Diagnosis not present

## 2014-06-18 DIAGNOSIS — Z9181 History of falling: Secondary | ICD-10-CM | POA: Diagnosis not present

## 2014-06-18 DIAGNOSIS — N183 Chronic kidney disease, stage 3 (moderate): Secondary | ICD-10-CM | POA: Diagnosis not present

## 2014-06-18 DIAGNOSIS — Z9981 Dependence on supplemental oxygen: Secondary | ICD-10-CM | POA: Diagnosis not present

## 2014-06-18 DIAGNOSIS — S8251XD Displaced fracture of medial malleolus of right tibia, subsequent encounter for closed fracture with routine healing: Secondary | ICD-10-CM | POA: Diagnosis not present

## 2014-06-18 DIAGNOSIS — I129 Hypertensive chronic kidney disease with stage 1 through stage 4 chronic kidney disease, or unspecified chronic kidney disease: Secondary | ICD-10-CM | POA: Diagnosis not present

## 2014-06-18 DIAGNOSIS — I480 Paroxysmal atrial fibrillation: Secondary | ICD-10-CM | POA: Diagnosis not present

## 2014-06-18 DIAGNOSIS — Z7901 Long term (current) use of anticoagulants: Secondary | ICD-10-CM | POA: Diagnosis not present

## 2014-06-18 DIAGNOSIS — I5042 Chronic combined systolic (congestive) and diastolic (congestive) heart failure: Secondary | ICD-10-CM | POA: Diagnosis not present

## 2014-06-18 NOTE — Telephone Encounter (Signed)
Please see note Below. Please advise.

## 2014-06-18 NOTE — Telephone Encounter (Signed)
Spoke w/ pt.  Advised him that the half life of Pradaxa is 12-17 hrs and he will need to take this BID. He states that Pradaxa was recently changed to tier 4 on his ins and the cost went up. Advised him that we can submit paperwork to see about getting a tier reduction on this for him.  He is appreciative and will call back w/ any questions or concerns.

## 2014-06-27 DIAGNOSIS — I10 Essential (primary) hypertension: Secondary | ICD-10-CM | POA: Diagnosis not present

## 2014-06-27 DIAGNOSIS — D631 Anemia in chronic kidney disease: Secondary | ICD-10-CM | POA: Diagnosis not present

## 2014-06-27 DIAGNOSIS — N183 Chronic kidney disease, stage 3 (moderate): Secondary | ICD-10-CM | POA: Diagnosis not present

## 2014-06-27 DIAGNOSIS — N179 Acute kidney failure, unspecified: Secondary | ICD-10-CM | POA: Diagnosis not present

## 2014-06-27 DIAGNOSIS — N2581 Secondary hyperparathyroidism of renal origin: Secondary | ICD-10-CM | POA: Diagnosis not present

## 2014-07-02 ENCOUNTER — Telehealth: Payer: Self-pay | Admitting: *Deleted

## 2014-07-02 NOTE — Telephone Encounter (Signed)
Awaiting appeal for Pradaxa up to 72 hours.

## 2014-07-03 DIAGNOSIS — J449 Chronic obstructive pulmonary disease, unspecified: Secondary | ICD-10-CM | POA: Diagnosis not present

## 2014-07-03 DIAGNOSIS — Z9181 History of falling: Secondary | ICD-10-CM | POA: Diagnosis not present

## 2014-07-03 DIAGNOSIS — N183 Chronic kidney disease, stage 3 (moderate): Secondary | ICD-10-CM | POA: Diagnosis not present

## 2014-07-03 DIAGNOSIS — S8251XD Displaced fracture of medial malleolus of right tibia, subsequent encounter for closed fracture with routine healing: Secondary | ICD-10-CM | POA: Diagnosis not present

## 2014-07-03 DIAGNOSIS — S82854D Nondisplaced trimalleolar fracture of right lower leg, subsequent encounter for closed fracture with routine healing: Secondary | ICD-10-CM | POA: Diagnosis not present

## 2014-07-03 DIAGNOSIS — Z9981 Dependence on supplemental oxygen: Secondary | ICD-10-CM | POA: Diagnosis not present

## 2014-07-03 DIAGNOSIS — M25571 Pain in right ankle and joints of right foot: Secondary | ICD-10-CM | POA: Diagnosis not present

## 2014-07-03 DIAGNOSIS — I480 Paroxysmal atrial fibrillation: Secondary | ICD-10-CM | POA: Diagnosis not present

## 2014-07-03 DIAGNOSIS — I5042 Chronic combined systolic (congestive) and diastolic (congestive) heart failure: Secondary | ICD-10-CM | POA: Diagnosis not present

## 2014-07-03 DIAGNOSIS — I129 Hypertensive chronic kidney disease with stage 1 through stage 4 chronic kidney disease, or unspecified chronic kidney disease: Secondary | ICD-10-CM | POA: Diagnosis not present

## 2014-07-03 DIAGNOSIS — Z7901 Long term (current) use of anticoagulants: Secondary | ICD-10-CM | POA: Diagnosis not present

## 2014-07-04 NOTE — Telephone Encounter (Signed)
The appeal has been approved.  It is good for the full plan. For the full year.

## 2014-07-08 DIAGNOSIS — I480 Paroxysmal atrial fibrillation: Secondary | ICD-10-CM | POA: Diagnosis not present

## 2014-07-08 DIAGNOSIS — N183 Chronic kidney disease, stage 3 (moderate): Secondary | ICD-10-CM | POA: Diagnosis not present

## 2014-07-08 DIAGNOSIS — Z9181 History of falling: Secondary | ICD-10-CM | POA: Diagnosis not present

## 2014-07-08 DIAGNOSIS — J449 Chronic obstructive pulmonary disease, unspecified: Secondary | ICD-10-CM | POA: Diagnosis not present

## 2014-07-08 DIAGNOSIS — S8251XD Displaced fracture of medial malleolus of right tibia, subsequent encounter for closed fracture with routine healing: Secondary | ICD-10-CM | POA: Diagnosis not present

## 2014-07-08 DIAGNOSIS — I5042 Chronic combined systolic (congestive) and diastolic (congestive) heart failure: Secondary | ICD-10-CM | POA: Diagnosis not present

## 2014-07-08 DIAGNOSIS — Z9981 Dependence on supplemental oxygen: Secondary | ICD-10-CM | POA: Diagnosis not present

## 2014-07-08 DIAGNOSIS — I129 Hypertensive chronic kidney disease with stage 1 through stage 4 chronic kidney disease, or unspecified chronic kidney disease: Secondary | ICD-10-CM | POA: Diagnosis not present

## 2014-07-08 DIAGNOSIS — Z7901 Long term (current) use of anticoagulants: Secondary | ICD-10-CM | POA: Diagnosis not present

## 2014-07-11 ENCOUNTER — Encounter
Admit: 2014-07-11 | Disposition: A | Discharge status: Home / Self Care | Payer: Self-pay | Attending: Internal Medicine | Admitting: Internal Medicine

## 2014-07-11 DIAGNOSIS — N183 Chronic kidney disease, stage 3 (moderate): Secondary | ICD-10-CM | POA: Diagnosis not present

## 2014-07-11 DIAGNOSIS — S8251XD Displaced fracture of medial malleolus of right tibia, subsequent encounter for closed fracture with routine healing: Secondary | ICD-10-CM | POA: Diagnosis not present

## 2014-07-11 DIAGNOSIS — Z9181 History of falling: Secondary | ICD-10-CM | POA: Diagnosis not present

## 2014-07-11 DIAGNOSIS — J449 Chronic obstructive pulmonary disease, unspecified: Secondary | ICD-10-CM | POA: Diagnosis not present

## 2014-07-11 DIAGNOSIS — Z7901 Long term (current) use of anticoagulants: Secondary | ICD-10-CM | POA: Diagnosis not present

## 2014-07-11 DIAGNOSIS — I5042 Chronic combined systolic (congestive) and diastolic (congestive) heart failure: Secondary | ICD-10-CM | POA: Diagnosis not present

## 2014-07-11 DIAGNOSIS — I480 Paroxysmal atrial fibrillation: Secondary | ICD-10-CM | POA: Diagnosis not present

## 2014-07-11 DIAGNOSIS — I129 Hypertensive chronic kidney disease with stage 1 through stage 4 chronic kidney disease, or unspecified chronic kidney disease: Secondary | ICD-10-CM | POA: Diagnosis not present

## 2014-07-15 ENCOUNTER — Encounter: Payer: Self-pay | Admitting: Internal Medicine

## 2014-07-15 ENCOUNTER — Ambulatory Visit (INDEPENDENT_AMBULATORY_CARE_PROVIDER_SITE_OTHER): Payer: Commercial Managed Care - HMO | Admitting: Internal Medicine

## 2014-07-15 ENCOUNTER — Other Ambulatory Visit: Payer: Self-pay | Admitting: Internal Medicine

## 2014-07-15 VITALS — BP 168/98 | HR 76 | Ht 73.0 in | Wt 246.8 lb

## 2014-07-15 DIAGNOSIS — I482 Chronic atrial fibrillation, unspecified: Secondary | ICD-10-CM

## 2014-07-15 DIAGNOSIS — R079 Chest pain, unspecified: Secondary | ICD-10-CM

## 2014-07-15 DIAGNOSIS — Z950 Presence of cardiac pacemaker: Secondary | ICD-10-CM | POA: Diagnosis not present

## 2014-07-15 MED ORDER — DILTIAZEM HCL ER COATED BEADS 120 MG PO CP24: 120.0000 mg | ORAL_CAPSULE | Freq: Every day | ORAL | Status: AC

## 2014-07-15 NOTE — Progress Notes (Signed)
Patient Care Team: Maryland Pink, MD as PCP - General (Family Medicine)   HPI  [name] is a 81 y.o. male Seen in followup for a pacemaker implanted for symptomatic bradycardia in the context of permanent atrial fibrillation. He takes dabigitran for anticoagulation.  He also has ischemic heart disease with prior bypass surgery and subsequent percutaneous revascularization. Last ejection fraction was 2015- he underwent Myoview scanning which was reviewed today. It demonstrated no ischemia, no scars and EF 55%.   He also suffers from morbid obesity and sleep apnea for which he tried but was unable to use and has returned his CPAP     Laboratories reviewed with a creatinine of 2.15/15. He has had a tendency to have hyperkalemia.     Past Medical History  Diagnosis Date  . Chronic atrial fibrillation     a. on pradaxa  . Coronary artery disease     a. s/p MI x 3;  b. s/p CABG x2 in 1999;  c. Cath 2007: LIMA->LAD patent, VG->OM 100, PCI/DES of RPL/mid RCA (cypher DES).  . Chronic obstructive pulmonary disease   . GERD (gastroesophageal reflux disease)   . Hyperlipidemia   . Hypertension   . PVD (peripheral vascular disease)   . AAA (abdominal aortic aneurysm)     a. s/p repair - 1999 @ [facility] @ time of CABG.  . Chronic diastolic CHF (congestive heart failure)     a. 01/2012 Echo:  EF 55%.  . Tachy-brady syndrome     a. s/p PPM 2010.  [name] CKD (chronic kidney disease), stage III     a. Acute on chronic 03/2012  . Cancer     skin cancer nose  . Sleep apnea     Past Surgical History  Procedure Laterality Date  . Coronary artery bypass graft    . Abdominal aortic aneurysm repair    . Insert / replace / remove pacemaker    . Foot surgery      left foot surgery to remove cyst.  . Trigger finger release    . Skin cancer biopsy      nose  . Ankle surgery Right     Current Outpatient Prescriptions  Medication Sig Dispense Refill  . donepezil (ARICEPT) 5 MG  tablet Take 5 mg by mouth at bedtime.    . metoprolol succinate (TOPROL-XL) 100 MG 24 hr tablet Take 1 tablet (100 mg total) by mouth daily. 30 tablet 3  . omeprazole (PRILOSEC) 20 MG capsule Take 20 mg by mouth daily.      [name] PRADAXA 150 MG CAPS capsule TAKE 1 CAPSULE BY MOUTH EVERY 12 HOURS 60 capsule 3  . simvastatin (ZOCOR) 20 MG tablet Take 1 tablet (20 mg total) by mouth at bedtime. 30 tablet 6  . Tamsulosin HCl (FLOMAX) 0.4 MG CAPS Take 0.4 mg by mouth daily after supper.    . torsemide (DEMADEX) 20 MG tablet Take 20 mg by mouth once.     No current facility-administered medications for this visit.    Allergies  Allergen Reactions  . Atorvastatin     Review of Systems negative except from HPI and PMH  Physical Exam BP 168/98 mmHg  Pulse 76  Ht 6\' 1"  (1.854 m)  Wt 246 lb 12 oz (111.925 kg)  BMI 32.56 kg/m2 Well developed and well nourished in no acute distress HENT normal E scleral and icterus clear Neck Supple JVP flat; carotids brisk and full Clear to ausculation  Regular rate and rhythm, no murmurs gallops or rub Soft with active bowel sounds No clubbing cyanosis 1+ Edema Alert and oriented, grossly normal motor and sensory function Skin Warm and Dry    Assessment and  Plan  Hypertension  Pacemaker  Ischemic heart disease with prior bypass  HFpEF  Permanent atrial fibrillation on dabigitran   Bradycardia   Blood pressure is moderately elevated. Heart rate histograms data demonstrates a right shift suggesting poor rate control. This may be contributing to his HFpEF. I would like to increase his blood pressure medications, however, according to him, his nephrologist decreased medications at his last visit presumably because creatinine was increasing in the context of modestly reduced blood pressure.  I spoke with his nephrologist. He decrease his diuretics. He is mostly euvolemic at this    We agreed to try low-dose diltiazem for both blood pressure as  well as heart rate control. He will be seen next week for repeat blood pressure measurements.  We will change his current dose of dabigitran based on his renal function to 75 mg twice daily

## 2014-07-15 NOTE — Patient Instructions (Addendum)
Your physician wants you to follow-up in: 1 year f/u with Dr. Caryl Comes.  You will receive a reminder letter in the mail two months in advance. If you don't receive a letter, please call our office to schedule the follow-up appointment.  Your physician has recommended you make the following change in your medication: START DILTIAZEM ( 120 MG ) DAILY.  Given Pradaxa ( 150 mg)  q 12 hrs today at office visit.

## 2014-07-16 ENCOUNTER — Telehealth: Payer: Self-pay

## 2014-07-16 DIAGNOSIS — J449 Chronic obstructive pulmonary disease, unspecified: Secondary | ICD-10-CM | POA: Diagnosis not present

## 2014-07-16 DIAGNOSIS — I480 Paroxysmal atrial fibrillation: Secondary | ICD-10-CM | POA: Diagnosis not present

## 2014-07-16 DIAGNOSIS — Z9181 History of falling: Secondary | ICD-10-CM | POA: Diagnosis not present

## 2014-07-16 DIAGNOSIS — N183 Chronic kidney disease, stage 3 (moderate): Secondary | ICD-10-CM | POA: Diagnosis not present

## 2014-07-16 DIAGNOSIS — Z7901 Long term (current) use of anticoagulants: Secondary | ICD-10-CM | POA: Diagnosis not present

## 2014-07-16 DIAGNOSIS — I5042 Chronic combined systolic (congestive) and diastolic (congestive) heart failure: Secondary | ICD-10-CM | POA: Diagnosis not present

## 2014-07-16 DIAGNOSIS — S8251XD Displaced fracture of medial malleolus of right tibia, subsequent encounter for closed fracture with routine healing: Secondary | ICD-10-CM | POA: Diagnosis not present

## 2014-07-16 DIAGNOSIS — I129 Hypertensive chronic kidney disease with stage 1 through stage 4 chronic kidney disease, or unspecified chronic kidney disease: Secondary | ICD-10-CM | POA: Diagnosis not present

## 2014-07-16 MED ORDER — DABIGATRAN ETEXILATE MESYLATE 75 MG PO CAPS: 75.0000 mg | ORAL_CAPSULE | Freq: Two times a day (BID) | ORAL | Status: DC

## 2014-07-16 NOTE — Telephone Encounter (Signed)
Robert Sprang, MD  Anselm Pancoast, CMA           She returned to call this man and have him change Pradaxa dose from 150-->>75 Twice Daily Please   Thanks [name].       LMOM for patient to contact our office for instructions.  New Rx sent to local pharmacy for Pradaxa 75 mg take one tablet twice daily.

## 2014-07-18 DIAGNOSIS — Z9181 History of falling: Secondary | ICD-10-CM | POA: Diagnosis not present

## 2014-07-18 DIAGNOSIS — N183 Chronic kidney disease, stage 3 (moderate): Secondary | ICD-10-CM | POA: Diagnosis not present

## 2014-07-18 DIAGNOSIS — S8251XD Displaced fracture of medial malleolus of right tibia, subsequent encounter for closed fracture with routine healing: Secondary | ICD-10-CM | POA: Diagnosis not present

## 2014-07-18 DIAGNOSIS — I480 Paroxysmal atrial fibrillation: Secondary | ICD-10-CM | POA: Diagnosis not present

## 2014-07-18 DIAGNOSIS — I5042 Chronic combined systolic (congestive) and diastolic (congestive) heart failure: Secondary | ICD-10-CM | POA: Diagnosis not present

## 2014-07-18 DIAGNOSIS — J449 Chronic obstructive pulmonary disease, unspecified: Secondary | ICD-10-CM | POA: Diagnosis not present

## 2014-07-18 DIAGNOSIS — I129 Hypertensive chronic kidney disease with stage 1 through stage 4 chronic kidney disease, or unspecified chronic kidney disease: Secondary | ICD-10-CM | POA: Diagnosis not present

## 2014-07-18 DIAGNOSIS — Z7901 Long term (current) use of anticoagulants: Secondary | ICD-10-CM | POA: Diagnosis not present

## 2014-07-21 ENCOUNTER — Telehealth: Payer: Self-pay | Admitting: Cardiovascular Disease

## 2014-07-21 NOTE — Telephone Encounter (Signed)
Patient tried to get refill for Pradaxa 75 mg (recent Change) and this changed his copay back to $96 which is not affordable for patient.  Patient has upcoming appt. And will need samples if Dr. Rockey Situ does not want to change to a cheaper medicine prior to appt.     Please call patient

## 2014-07-21 NOTE — Telephone Encounter (Signed)
Spoke with patient regarding pradaxa samples and cost of medication. Provided samples of Pradaxa 75 mg. Would like to know what to do for patient since the change from pradax 150 mg to 75 mg is too expensive for the patient. Please advise.

## 2014-07-23 DIAGNOSIS — I480 Paroxysmal atrial fibrillation: Secondary | ICD-10-CM | POA: Diagnosis not present

## 2014-07-23 DIAGNOSIS — Z9181 History of falling: Secondary | ICD-10-CM | POA: Diagnosis not present

## 2014-07-23 DIAGNOSIS — Z7901 Long term (current) use of anticoagulants: Secondary | ICD-10-CM | POA: Diagnosis not present

## 2014-07-23 DIAGNOSIS — I129 Hypertensive chronic kidney disease with stage 1 through stage 4 chronic kidney disease, or unspecified chronic kidney disease: Secondary | ICD-10-CM | POA: Diagnosis not present

## 2014-07-23 DIAGNOSIS — N183 Chronic kidney disease, stage 3 (moderate): Secondary | ICD-10-CM | POA: Diagnosis not present

## 2014-07-23 DIAGNOSIS — I5042 Chronic combined systolic (congestive) and diastolic (congestive) heart failure: Secondary | ICD-10-CM | POA: Diagnosis not present

## 2014-07-23 DIAGNOSIS — S8251XD Displaced fracture of medial malleolus of right tibia, subsequent encounter for closed fracture with routine healing: Secondary | ICD-10-CM | POA: Diagnosis not present

## 2014-07-23 DIAGNOSIS — J449 Chronic obstructive pulmonary disease, unspecified: Secondary | ICD-10-CM | POA: Diagnosis not present

## 2014-07-25 DIAGNOSIS — N183 Chronic kidney disease, stage 3 (moderate): Secondary | ICD-10-CM | POA: Diagnosis not present

## 2014-07-25 DIAGNOSIS — Z7901 Long term (current) use of anticoagulants: Secondary | ICD-10-CM | POA: Diagnosis not present

## 2014-07-25 DIAGNOSIS — J449 Chronic obstructive pulmonary disease, unspecified: Secondary | ICD-10-CM | POA: Diagnosis not present

## 2014-07-25 DIAGNOSIS — I480 Paroxysmal atrial fibrillation: Secondary | ICD-10-CM | POA: Diagnosis not present

## 2014-07-25 DIAGNOSIS — Z9181 History of falling: Secondary | ICD-10-CM | POA: Diagnosis not present

## 2014-07-25 DIAGNOSIS — S8251XD Displaced fracture of medial malleolus of right tibia, subsequent encounter for closed fracture with routine healing: Secondary | ICD-10-CM | POA: Diagnosis not present

## 2014-07-25 DIAGNOSIS — I5042 Chronic combined systolic (congestive) and diastolic (congestive) heart failure: Secondary | ICD-10-CM | POA: Diagnosis not present

## 2014-07-25 DIAGNOSIS — I129 Hypertensive chronic kidney disease with stage 1 through stage 4 chronic kidney disease, or unspecified chronic kidney disease: Secondary | ICD-10-CM | POA: Diagnosis not present

## 2014-07-29 NOTE — Discharge Summary (Signed)
PATIENT NAME:  [name], [name] MR#:  [id number] DATE OF BIRTH:  December 29, 1933  DATE OF ADMISSION:  01/16/2012 DATE OF DISCHARGE:  01/17/2012  DISCHARGE DIAGNOSES:  1. Possible diastolic congestive heart failure exacerbation.  2. Hypertension.  3. Hyperlipidemia.  4. Abdominal aortic aneurysm, status post repair.  5. History of atrial fibrillation, status post pacemaker.  6. Coronary artery disease, status post coronary artery bypass graft.  7. Gastroesophageal reflux disease.  8. Chronic obstructive pulmonary disease. 9. Atypical chest pain.  10. Accelerated hypertension on admission.   DISPOSITION: The patient is being discharged home.   FOLLOWUP:  1. Follow up with primary care physician, Dr. Kary Kos, in 1 to 2 weeks after discharge. 2. Follow up with cardiologist, Dr. Rockey Situ, in 1 to 2 weeks after discharge.   DIET: Low sodium.   ACTIVITY: As tolerated.   DISCHARGE MEDICATIONS:  1. Aspirin 81 mg, 2 tablets once a day. 2. Cardizem extended release 180 mg once a day. 3. Lisinopril 10 mg daily.  4. Pravachol 20 mg daily.  5. Pradaxa 150 mg b.i.d.  6. Lopressor 50 mg daily.  7. Senokot1 tablet b.i.d.  8. KCl 20 mEq daily.  9. Lasix 20 mg b.i.d.  10. Advair 250/50, 1 puff b.i.d.  11. ProAir HFA 2 puffs every 4 hours p.r.n. shortness of breath.   LABORATORY, DIAGNOSTIC AND RADIOLOGICAL DATA: Cardiac enzymes negative. Chest x-ray showed no acute abnormality. CBC normal. Creatinine 1.76. The rest of BMP is normal.   HOSPITAL COURSE: The patient is a 81 year old male with past medical history of atrial fibrillation, hypertension, abdominal aortic aneurysm, status post repair who presented with symptom of shortness of breath, PND, and orthopnea. He was found to have possible diastolic congestive heart failure exacerbation. His last echo was from January of 2013 which showed an ejection fraction of 50 to 55% with elevated RVSP of 30 to 40 mmHg. He had long-standing hypertension. He was  admitted to the hospital and started on diuresis with IV Lasix with good diuresis and symptomatic improvement. He also had some atypical chest pain which was felt to be noncardiac, but given his history he was monitored on telemetry; and serial cardiac enzymes were checked which were negative. He had stable chronic obstructive pulmonary disease without any evidence of exacerbation. He had accelerated hypertension on admission which was better controlled by the time of discharge. The patient symptomatically felt better and wished to be discharged home. He is being discharged home in a stable condition.   TIME SPENT: 45 minutes.  ____________________________ Cherre Huger, MD sp:cbb D: 01/17/2012 15:36:42 ET T: 01/18/2012 10:15:27 ET JOB#: [id number]  cc: Cherre Huger, MD, <Dictator> Irven Easterly. Kary Kos, MD Cherre Huger MD ELECTRONICALLY SIGNED 01/18/2012 14:19

## 2014-07-29 NOTE — Discharge Summary (Signed)
PATIENT NAME:  [name], [name] MR#:  [id number] DATE OF BIRTH:  02-11-1934  DATE OF ADMISSION:  03/30/2012 DATE OF DISCHARGE:  04/01/2012  PRIMARY CARE PHYSICIAN: Dr. Kary Kos.  CONSULTING PHYSICIANS:  1. Cardiology, Dr. Fletcher Anon. 2. Nephrology, Dr. Juleen China.  DISCHARGE DIAGNOSES:  1. Hypotension.  2. Acute renal failure on chronic kidney disease.  3. Atrial fibrillation. 4. Chronic diastolic congestive heart failure.  5. Coronary artery disease.  6. Hyperlipidemia.  7. Anemia.   CONDITION: Stable.   CODE STATUS: Full code.   HOME MEDICATIONS:  1. Aspirin 81 mg p.o. 2 tablets once a day.  2. Diltiazem 180 mg one cap once a day.  3. Pravastatin 20 mg p.o. daily.  4. Pradaxa 150 mg p.o. b.i.d.  5. Advair 1 puff b.i.d. p.r.n. for breathing.  6. Lopressor 100 mg p.o. once a day.  7. Flomax 0.4 mg p.o. at bedtime.  8. Omeprazole 20 mg p.o. daily.  9. Multivitamin 1 tablet p.o. daily.   STOPPED MEDICATION: 1. Lasix 20 mg p.o. 2 tablets once a day.  2. Lisinopril 10 mg p.o. daily due to acute renal failure and dehydration.   DIET: Low-sodium, low-fat, low-cholesterol diet.   ACTIVITY: As tolerated.   FOLLOW-UP CARE:  1. Follow up with PCP within 1 to 2 weeks.  2. Follow-up BMP in Dr. Assunta Gambles office within 1 week. 3. The patient needs a followup with Dr. Fletcher Anon within 1 to 2 weeks.   REASON FOR ADMISSION: Hypotension.   HOSPITAL COURSE:  1. The patient is a 81 year old Caucasian male with a history of CAD, PVD, AFib, hypertension, hyperlipidemia, presented to the ED with hypotension. The patient has a significant loss of appetite and increased low back pain. He went to see Dr. Kary Kos and was noted to have a low blood pressure. The patient felt very weak, was sent to ED for further evaluation by Dr. Kary Kos. In the ED, his blood pressure was 72/52 and also was found to be hypoxic with O2 saturation 90s. For detailed history and physical examination, please refer to the admission  note dictated by Dr. Laurin Coder. On admission date, the patient's glucose was 101, BUN 65, creatinine 3.46, sodium 137. Troponin negative. WBC 6.8, hemoglobin 12.1. Urinalysis was normal. Chest x-ray was normal. After admission, the patient was treated with IV fluid bolus, then IV fluid support. Lasix, lisinopril, and Lopressor were on hold due to hypotension and acute renal failure. According to Dr. Tyrell Antonio suggestion, the patient's Pradaxa was on hold due to acute renal failure. The patient's hypotension has improved after above-mentioned treatment. Then, Dr. Fletcher Anon suggested to resume Pradaxa and the Lopressor. 2. Acute renal failure. The patient's BUN decreased to 35 and creatinine decreased to 1.4 after IV fluid support, and according to Dr. Juleen China, the patient can follow up as an outpatient.  3. For AFib, as mentioned above, the patient was treated with Pradaxa and Lopressor. The patient is clinically stable and was discharged to home yesterday. Discussed the patient's discharge plan with the patient, the case manager, and Dr. Juleen China.  TIME SPENT: About 35 minutes.    ____________________________ Demetrios Loll, MD qc:es D: 04/02/2012 15:54:49 ET T: 04/03/2012 09:09:28 ET JOB#: [id number]  cc: Demetrios Loll, MD, <Dictator> Demetrios Loll MD ELECTRONICALLY SIGNED 04/04/2012 16:09

## 2014-07-29 NOTE — Telephone Encounter (Signed)
No good answer but he is to see TG next week

## 2014-07-29 NOTE — H&P (Signed)
PATIENT NAME:  [name], [name] MR#:  [id number] DATE OF BIRTH:  Jan 06, 1934  DATE OF ADMISSION:  01/16/2012  REFERRING PHYSICIAN: ER physician, Dr. Corky Downs   PRIMARY CARE PHYSICIAN: Calla Kicks, MD, who is moving, therefore, the patient is going to try and establish care with Dr. Kary Kos.  CARDIOLOGIST: Dr. Rockey Situ    CHIEF COMPLAINT: Shortness of breath.   HISTORY OF PRESENT ILLNESS: The patient is a 81 year old male with past medical history of chronic atrial fibrillation on Pradaxa, coronary artery disease status post coronary artery bypass graft, chronic obstructive pulmonary disease, gastroesophageal reflux disease, hyperlipidemia, hypertension, and history of abdominal aortic aneurysm who reports that he has gained about 20 pounds in the last six months. Of those he gained 7 pounds in the last week. Normally he sleeps with two pillows but in the last week has been having difficulty laying down flat and has to sleep sitting up. He complains of orthopnea and PND. In the middle of the night he has to get up feeling short of breath and coughing. He denies any leg swelling. He has also been having some chest pain, feels very bloated and constipated. He initially felt that he was having some sinus problems, however, his symptoms continued to get worse, therefore, he decided to come to the Emergency Room.   ALLERGIES: Lipitor.  CURRENT MEDICATIONS:  1. Aspirin 81 mg daily.  2. Cardizem 180 mg daily.  3. Lisinopril 10 mg daily.  4. Lopressor 50 mg once a day. 5. Pradaxa 150 mg b.i.d.  6. Pravachol 20 mg daily.   PAST MEDICAL HISTORY:  1. History of abdominal aortic aneurysm.  2. Atrial fibrillation.   3. Coronary artery bypass graft. 4. Left hip replacement.  5. Hyperlipidemia.  6. Hypertension.  7. History of MI x3. 8. History of coronary artery bypass graft. 9. Pacemaker. 10. Cardiac stent placement. 11. Excision of Dupuytren fracture, left hand. 12. Left Girton finger excision of  plantar fascia nodule.  13. Gastroesophageal reflux disease. 14. Chronic obstructive pulmonary disease.   SOCIAL HISTORY: Denies any history of smoking, alcohol, or drug abuse. He is married. Lives with his wife. 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   FAMILY HISTORY: Mother died at age 39 of stomach cancer. Father died at age 70 of cerebral hemorrhage. Both parents had diabetes, hypertension, and coronary artery disease.  REVIEW OF SYSTEMS: CONSTITUTIONAL: Denies any fever or fatigue. EYES: Denies any blurred or double vision. ENT: Denies any tinnitus or ear pain. RESPIRATORY: Reports cough, shortness of breath. CARDIOVASCULAR: Reports chest pressure. Denies any palpitations, syncope. GI: Reports constipation. Denies any nausea, vomiting, diarrhea, abdominal pain. GU: Denies any dysuria or hematuria. ENDOCRINE: Denies any polyuria or nocturia. HEME/LYMPH: Denies any anemia or easy bruisability. INTEGUMENTARY: Denies any acne or rash. MUSCULOSKELETAL: Denies any swelling or gout. NEUROLOGICAL: Denies any numbness, weakness. PSYCH: Denies any anxiety, depression.   PHYSICAL EXAMINATION:   VITAL SIGNS: Temperature 97.3, heart rate 71, respiratory rate 22, blood pressure 174/114, pulse oximetry 95%.   GENERAL: The patient is an elderly Caucasian male sitting up in bed.   HEAD: Atraumatic, normocephalic.   EYES: No pallor, icterus, or cyanosis. Pupils equal, round, and reactive to light and accommodation. Extraocular movements intact.   ENT: Wet mucous membranes. No oropharyngeal erythema or thrush.   NECK: Supple. No masses. No JVD. No thyromegaly. No lymphadenopathy.   CHEST WALL: No tenderness to palpation. Not using accessory muscles of respiration. No intercostal muscle retractions.   LUNGS: Bilateral basal  crepitations. No wheezing or rhonchi.   CARDIOVASCULAR: S1, S2 regular. No murmur or rubs. There is a systolic murmur. No rubs or gallops.   ABDOMEN: Soft. No  guarding. No rigidity. No organomegaly. Normal bowel sounds.   SKIN: No rashes or lesions.   PERIPHERIES: Trace pedal edema, 1+ pedal pulses.   MUSCULOSKELETAL: No cyanosis or clubbing.   NEUROLOGIC: Cranial nerves grossly intact. Awake, alert, oriented x3. Nonfocal neurologic exam.   PSYCH: Normal mood and affect.   LABORATORY, DIAGNOSTIC, AND RADIOLOGICAL DATA: EKG is paced rhythm.   CBC normal.   Chest x-ray shows no acute cardiopulmonary pathology.   BNP 1764. Cardiac enzymes negative. Creatinine 1.76, glucose 117. Rest of CMP is normal. INR 1.1.   ASSESSMENT AND PLAN: This is a 81 year old male who presents with symptoms of shortness of breath, orthopnea, PND, and weight gain. 1. Possible diastolic CHF exacerbation. The patient's last echo is from January of 2013 which showed EF of 50 to 55%. He did have elevated RSVP of 30 to 40 mm at that time. He has no systolic dysfunction. He is presenting with symptoms of PND, orthopnea, and weight gain. Will admit him to telemetry bed and diurese with Lasix. Will monitor strict I's and O's and daily weight.  2. Chest pain. The patient reports chest pressure and bloating. This is atypical and could be related to GI issue/dyspepsia, however, given his history of CAD will continue his aspirin, beta-blocker, and place on p.r.n. nitroglycerin. Will check serial cardiac enzymes.  3. History of COPD, appears to be stable at present. There is no evidence of any wheezing. Will place him on p.r.n. nebulizer treatments.  4. Constipation. Will start him on a bowel regimen.  5. Accelerated hypertension. The patient's blood pressure is elevated at present. Will continue his lisinopril, beta-blocker. Place on p.r.n. hydralazine and IV Lasix. 6. Hyperlipidemia. Will continue Pravachol.    Reviewed old medical records, discussed with the patient and his wife the plan of care and management.   TIME SPENT: 75 minutes.   ____________________________ Cherre Huger, MD sp:drc D: 01/16/2012 11:51:51 ET T: 01/16/2012 12:23:16 ET JOB#: [id number]  cc: Cherre Huger, MD, <Dictator> Irven Easterly. Kary Kos, MD Cherre Huger MD ELECTRONICALLY SIGNED 01/17/2012 15:56

## 2014-07-29 NOTE — Consult Note (Signed)
General Aspect 81 year old gentleman with a history of coronary artery disease, bypass surgery in 1999 with catheterization in 2007 with patent LIMA to the LAD, occluded vein graft to the OM, history of Cypher stent to the PL branch and mid RCA in September 07, ejection fraction 45-50%, chronic atrial fibrillation, h/o of heavy ETOH and falls (none recently?),  Pacemaker was placed by Dr. Elisabeth Cara on 25, 2010 for syncope, AFib with pauses. He is status post right knee replacement.  he was hospitalized at [facility] in October of this year for acute on chronic congestive heart failure. He improved with Lasix. Over the last few weeks, he reports poor appetite and having no energy. He feels tired, dizzy and short of breath. He was noted to be hypotensive with a systolic blood pressure of 75 and thus  he was sent to the emergency room where he was found to have acute on chronic renal failure with a creatinine above 3.  His creatinine October was 1.78.[name]    Present Illness PAST MEDICAL HISTORY:  1. Atrial fibrillation.  2. Myocardial infarction x3.  3. Coronary artery bypass graft.  4. Hypercholesterolemia.  5. Hypertension. 6. Aortic aneurysm.  7. Pacemaker. 8. Left hip replacement.  9. Cardiac stent.  10.         Chronic systolic and diastolic heart failure.  ALLERGIES: Lipitor.   MEDICATIONS: Flomax 0.4 mg once daily, Pravastatin 20 mg once daily, Pradaxa 150 mg 2 times daily, omeprazole 20 mg once daily, multivitamins once daily, metoprolol 100 mg once a day, lisinopril 10 mg once a day, furosemide 20 mg 2 tablets once a day, fluticasone and salmeterol twice daily,  Diltiazem 180 mg extended-release once daily, aspirin 81 mg once daily.    SOCIAL HISTORY: The patient denies tobacco abuse. He quit drinking a few years ago. He denies drug abuse. He lives with his wife.   FAMILY HISTORY: Negative for diabetes, stroke.   Physical Exam:   GEN no acute distress, Appears uncomfortable, breathing hard     HEENT hearing intact to voice, moist oral mucosa    NECK supple    RESP normal resp effort  clear BS  on the right    CARD Irregular rate and rhythm  No murmur    ABD denies tenderness  soft    LYMPH negative neck    EXTR negative edema    SKIN normal to palpation    NEURO motor/sensory function intact    PSYCH alert, A+O to time, place, person, good insight   Review of Systems:   Subjective/Chief Complaint fatigue and hypotension.    General: Weight loss or gain  Fatigue  Weakness  anorexia    Skin: No Complaints    ENT: No Complaints    Eyes: No Complaints    Neck: No Complaints    Respiratory: Frequent cough  Short of breath  Wheezing  Sputum    Cardiovascular: Dyspnea    Gastrointestinal: No Complaints    Genitourinary: No Complaints    Vascular: No Complaints    Musculoskeletal: No Complaints    Neurologic: No Complaints    Hematologic: No Complaints    Endocrine: No Complaints    Psychiatric: No Complaints    Review of Systems: All other systems were reviewed and found to be negative    Medications/Allergies Reviewed Medications/Allergies reviewed   Home Medications: Medication Instructions Status  Aspirin Low Dose 81 mg oral tablet 2 tab(s) orally once a day  Active  diltiazem 180 mg/24  hours oral capsule, extended release 1 cap(s) orally once a day Active  pravastatin 20 mg oral tablet 1 tab(s) orally once a day  Active  Pradaxa 150 mg oral capsule 1 cap(s) orally 2 times a day Active  fluticasone-salmeterol 250 mcg-50 mcg inhalation powder 1 puff(s) inhaled 2 times a day as needed for breathing. Active  furosemide 20 mg oral tablet 2 tabs (24m) orally once a day. Active  lisinopril 10 mg oral tablet 1 tab(s) orally once a day. **primary md stopped medication on 03/30/12** Active  metoprolol tartrate 100 mg oral tablet 1 tab(s) orally once a day Active  tamsulosin 0.4 mg oral capsule 1 cap(s) orally once a day (at bedtime) Active   omeprazole 20 mg oral delayed release capsule 1 cap(s) orally once a day. Active  multivitamin 1 tab(s) orally once a day. Active   Lab Results: Hepatic:  20-Dec-13 12:04    Bilirubin, Total 0.3   Alkaline Phosphatase 84   SGPT (ALT) 23   SGOT (AST) 15   Total Protein, Serum 7.4   Albumin, Serum 3.5  Routine Chem:  20-Dec-13 12:04    Glucose, Serum  101   BUN  65   Creatinine (comp)  3.46   Sodium, Serum 137   Potassium, Serum 4.0   Chloride, Serum 106   CO2, Serum  19   Calcium (Total), Serum 8.9   Osmolality (calc) 293   eGFR (African American)  19   eGFR (Non-African American)  16 (eGFR values <663mmin/1.73 m2 may be an indication of chronic kidney disease (CKD). Calculated eGFR is useful in patients with stable renal function. The eGFR calculation will not be reliable in acutely ill patients when serum creatinine is changing rapidly. It is not useful in  patients on dialysis. The eGFR calculation may not be applicable to patients at the low and high extremes of body sizes, pregnant women, and vegetarians.)   Anion Gap 12  Cardiac:  20-Dec-13 12:04    Troponin I < 0.02 (0.00-0.05 0.05 ng/mL or less: NEGATIVE  Repeat testing in 3-6 hrs  if clinically indicated. >0.05 ng/mL: POTENTIAL  MYOCARDIAL INJURY. Repeat  testing in 3-6 hrs if  clinically indicated. NOTE: An increase or decrease  of 30% or more on serial  testing suggests a  clinically important change)  Routine UA:  20-Dec-13 13:26    Color (UA) Yellow   Clarity (UA) Hazy   Glucose (UA) Negative   Bilirubin (UA) Negative   Ketones (UA) Negative   Specific Gravity (UA) 1.014   Blood (UA) Negative   pH (UA) 5.0   Protein (UA) Negative   Nitrite (UA) Negative   Leukocyte Esterase (UA) Negative (Result(s) reported on 30 Mar 2012 at 02:14PM.)   RBC (UA) 1 /HPF   WBC (UA) 2 /HPF   Bacteria (UA) TRACE   Epithelial Cells (UA) 1 /HPF   Mucous (UA) PRESENT (Result(s) reported on 30 Mar 2012 at  02:14PM.)  Routine Hem:  20-Dec-13 12:04    WBC (CBC) 6.8   RBC (CBC)  3.87   Hemoglobin (CBC)  12.1   Hematocrit (CBC)  36.2   Platelet Count (CBC) 235 (Result(s) reported on 30 Mar 2012 at 12:23PM.)   MCV 94   MCH 31.3   MCHC 33.5   RDW 13.3   EKG:   Interpretation telemetry shows atrial fibrillation. I ordered a 12-lead ECG.    Lipitor: Unknown  Vital Signs/Nurse's Notes: **Vital Signs.:   20-Dec-13 16:28   Temperature Temperature (  F) 97.7   Celsius 36.5   Temperature Source axillary   Pulse Pulse 86   Respirations Respirations 20   Systolic BP Systolic BP 891   Diastolic BP (mmHg) Diastolic BP (mmHg) 68   Mean BP 85   Pulse Ox % Pulse Ox % 99   Pulse Ox Activity Level  At rest   Oxygen Delivery 2L; Nasal Cannula     Impression 1. Acute on chronic renal failure likely due to volume depletion and possible ATN: hold all antihypertensive medications given that he was hypotensive. Hold  all nephrotoxic medications and diuretics. I will increase his IV fluids to 75 mL per hour.  2. atrial fibrillation: rate is controlled. I will hold Pradaxa due to acute renal failure. This will be resumed once  renal function is improved.  3 CAD: Cardiac enz negative so far.  seems to be stable.  4 . chronic systolic/diastolic heart failure: Continue to monitor fluid status.  5. poor appetite and weight loss: Consider age-appropriate screening for possible underlying malignancy   Electronic Signatures: Kathlyn Sacramento (MD)  (Signed 20-Dec-13 17:28)  Authored: General Aspect/Present Illness, History and Physical Exam, Review of System, Home Medications, Labs, EKG , Allergies, Vital Signs/Nurse's Notes, Impression/Plan   Last Updated: 20-Dec-13 17:28 by Kathlyn Sacramento (MD)

## 2014-08-01 LAB — MDC_IDC_ENUM_SESS_TYPE_REMOTE
Battery Voltage: 2.72 V
Lead Channel Pacing Threshold Amplitude: 0.875 V
Lead Channel Pacing Threshold Pulse Width: 0.4 ms
Lead Channel Setting Pacing Pulse Width: 0.4 ms
MDC IDC MSMT LEADCHNL RV IMPEDANCE VALUE: 444 Ohm
MDC IDC MSMT LEADCHNL RV SENSING INTR AMPL: 11.2 mV
MDC IDC SET LEADCHNL RV PACING AMPLITUDE: 2.5 V
MDC IDC SET LEADCHNL RV SENSING SENSITIVITY: 5.6 mV
MDC IDC STAT BRADY RV PERCENT PACED: 70.2 %

## 2014-08-01 NOTE — H&P (Signed)
PATIENT NAME:  [name], [name] MR#:  [id number] DATE OF BIRTH:  11-28-1933  DATE OF ADMISSION:  03/30/2012  REASON FOR ADMISSION: Severe hypotension.   REFERRING PHYSICIAN: Dr. Lenise Arena   PRIMARY CARE PHYSICIAN: Dr. Kary Kos   PRIMARY CARDIOLOGIST: Dr. Esmond Plants  HISTORY OF PRESENT ILLNESS: [name] is a very nice 81 year old gentleman who has history of multiple medical problems including coronary artery disease, peripheral artery disease with abdominal aortic aneurysm repair, atrial fibrillation, hyperlipidemia, hypertension, history of three cardiac attacks in the past, and he has a pacemaker. He has been recently admitted over here on 01/16/2012 due to shortness of breath. At that moment he was diagnosed with congestive heart failure and was discharged home after some fluids were given. His congestive heart failure was diastolic. His ejection fraction at that moment was 55%. The patient had been doing okay up until 2 weeks ago when he started with significant loss of appetite, although he has been drinking a good amount of fluids. He has been having increased low back pain due to not moving around much, and today they went to see Dr. Kary Kos for an appointment of his wife. He had his blood pressure checked, and the nurse asked him to come to the ER because he was very low. He has been feeling very weak, but he has not been dizzy or lightheaded. He has not had any significant orthostatic sinus symptoms like any dizziness happening whenever he stands up or gets out of the bed. He states that overall chronically he has been very fatigued and gets really winded walking. He cannot do it for more than 10 to 20 feet because he has significant dyspnea. He denies any significant chest pain, although he states that he has some soreness of his permanent pacemaker. His pacemaker needs to be checked within the next week. He comes to the ER, and in the ER he was found to be in acute kidney failure with  significant hypotension with blood pressures as low as 72/55. His heart rate has been around 60 to 70, but he has a pacemaker.

## 2014-08-02 NOTE — Op Note (Signed)
PATIENT NAME:  [name], [name] MR#:  [id number] DATE OF BIRTH:  Jun 24, 1933  DATE OF PROCEDURE:  10/08/2013  PREOPERATIVE DIAGNOSES:  1.  Dupuytren's disease, right palm and right Niederer finger with proximal interphalangeal joint contracture.  2.  Dupuytren's nodule, radial base of right thumb.   POSTOPERATIVE DIAGNOSES: 1.  Dupuytren's disease, right palm and right Hoel finger with proximal interphalangeal joint contracture.  2.  Dupuytren's nodule, radial base of right thumb.   PROCEDURES:  1.  Release of Dupuytren's fascia, right palm and right Ishee finger.  2.  Release of proximal interphalangeal joint contracture, right Mullen finger.  3.  Excision of Dupuytren's nodule right.   SURGEON: Christophe Louis, M.D.   ANESTHESIA: General.   COMPLICATIONS: None.   TOURNIQUET TIME: 114 minutes.   DESCRIPTION OF PROCEDURE: After adequate induction of general anesthesia, the right upper extremity was thoroughly prepped with alcohol and ChloraPrep and draped in a standard sterile fashion. The extremity is wrapped out with the Esmarch bandage, and the pneumatic tourniquet elevated to 250 mmHg. Under loupe magnification, a longitudinal incision is made over the Dupuytren's nodule at the radial base of the right thumb. The dissection is carefully carried down with the scissors and the nodule excised. The wound is thoroughly irrigated  several times and the skin is closed with 4-0 nylon. Attention is then turned to the right palm and the right Kavan finger. There is seen to be an extensive cord extending from the right palm out to the middle phalanx level with a 90 degree flexion contracture of the PIP joint. Standard Bruner incision is made extending from the mid palm to the distal phalanx. The wound is developed proximally and the neurovascular bundles are identified on each side of the Dupuytren's cord.  The dissection is carried out under loupe magnification and the neurovascular bundles are  completely dissected out all the way passed the contracted the PIP joint. With the neurovascular bundles safe, the Dupuytren's fascia is completely excised. Following this, there remained a significant contracture of the PIP joint. The flexor tendon sheath is then reflected to the side. The volar plate of the PIP joint is released. This allowed the PIP joint to come out to complete extension. Careful palpation demonstrated no residual Dupuytren's disease. The wound is thoroughly irrigated multiples.  One large vessel loop drain is brought out through separate stab wound incision. The skin is closed with 5-0 nylon. A soft bulky dressing with a volar splint keeping the ring and Friend fingers extended is applied. The tourniquet is released. The patient is returned to the recovery room in satisfactory condition having tolerated the procedure quite well.    ____________________________ Lucas Mallow, MD ces:ts D: 10/08/2013 10:09:05 ET T: 10/08/2013 11:12:55 ET JOB#: [id number]  cc: Lucas Mallow, MD, <Dictator> Lucas Mallow MD ELECTRONICALLY SIGNED 10/14/2013 10:08

## 2014-08-03 NOTE — H&P (Signed)
PATIENT NAME:  [name], GREENLEY MR#:  [id number] DATE OF BIRTH:  1933/09/08  DATE OF ADMISSION:  04/14/2011  PRIMARY CARE PHYSICIAN: Dr. Eliberto Ivory.  EMERGENCY ROOM PHYSICIAN: Dr. Robet Leu.   CHIEF COMPLAINT: Shortness of breath.   HISTORY OF PRESENT ILLNESS: The patient is a 81 year old male who presents with chief complaint of shortness of breath. Symptoms began two weeks ago and have been gradually getting worse. The patient has developed cough productive of green sputum. His chest felt sore from congestion. Symptoms are exacerbated by lying flat. He has history of pacemaker placement coronary artery disease. He was evaluated by Dr. Rockey Situ one week ago and was doing okay. He denies any chest pain.   PAST MEDICAL HISTORY:  1. Atrial fibrillation.  2. Myocardial infarction x3.  3. Coronary artery bypass graft.  4. Hypercholesterolemia.  5. Hypertension. 6. Aortic aneurysm.  7. Pacemaker. 8. Left hip replacement.  9. Cardiac stent.   ALLERGIES: Lipitor.   CURRENT MEDICATIONS:  1. Aspirin 81 mg p.o. daily.  2. Diltiazem 120 mg p.o. daily.  3. Ferrous sulfate 325 mg p.o. daily.  4. Fluoxetine 20 mg p.o. daily.  5. Lisinopril 10 mg p.o. daily.  6. Metoprolol 50 mg p.o. in a.m.  7. Omega 3 polyunsaturated fatty acids 1,200 mg 2 p.o. daily.  8. Omeprazole 20 mg p.o. daily.  9. Pravastatin 20 mg p.o. daily.   SOCIAL HISTORY: The patient denies tobacco abuse. He quit drinking a few years ago. He denies drug abuse. He lives with his wife.   FAMILY HISTORY: Negative for diabetes, stroke.   REVIEW OF SYSTEMS: CONSTITUTIONAL: The patient denies any fevers, chills, or night sweats. HEENT: The patient denies any hearing loss, dysphagia, visual problems, or sore throat. CARDIOVASCULAR: The patient denies any chest pain, orthopnea, or paroxysmal nocturnal dyspnea. RESPIRATORY: The patient denies any cough, wheezing, or hemoptysis. GASTROINTESTINAL: The patient denies any abdominal pain, hematemesis,  hematochezia, or melena. GU: The patient denies any hematuria, dysuria, or frequency. NEUROLOGIC: The patient denies any headache, focal weakness or seizures. SKIN: The patient denies lesions or rash. ENDOCRINE: The patient denies any polyuria, polyphagia, or polydipsia. MUSCULOSKELETAL: The patient denies any arthritis, joint effusion, or swelling. HEMATOLOGICAL: The patient denies any easy bleeding or bruises.   PHYSICAL EXAMINATION:  VITAL SIGNS: Temperature 97.8, heart rate 67, respirations 22, blood pressure 137/80, oxygen saturation 95%.   HEENT: Atraumatic, normocephalic. Pupils equal, round, and reactive to light. Extraocular movements intact. Sclerae anicteric. Mucous membranes moist.   NECK: Supple. No organomegaly.   CARDIOVASCULAR: S1, S2, regular rate, rhythm. No gallops. No thrills. No murmurs.   LUNGS: Clear to auscultation. No rales, no rhonchi, no wheezes, no bronchial breath sounds.   GI: Abdomen is soft, nontender, nondistended. Normal bowel sounds. No hepatosplenomegaly.   GU: There is no hematuria or masses noted.   SKIN: No lesions. No rash.   ENDOCRINE: No masses. No thyromegaly.   LYMPH: No lymphadenopathy or nodes palpable.   NEUROLOGIC: Cranial nerves II through XII grossly intact. Motor strength is five out of five bilateral upper and lower extremities. Sensation within normal limits. No focal neurological deficits noted on examination.   MUSCULOSKELETAL: No arthritis, joint effusion, or swelling.   RHEUMATOLOGICAL: No ecchymosis, no bleeding, and no petechiae noted.   LABORATORY, RADIOLOGICAL AND DIAGNOSTIC DATA: EKG: Ventricular pacemaker 72 beats per minute. Glucose 98, BUN 34, creatinine 1.79, sodium 138, potassium 4.5, chloride 106, CO2 20, calcium 9. Estimated GFR 40. WBC count 10,300, hemoglobin 13.5, hematocrit 40.6, platelet  count 217. Troponin less than 0.02. Chest x-ray is negative.   ASSESSMENT AND PLAN:  1. The patient is a 81 year old male who  presents with chief complaint of shortness of breath and acute chronic obstructive pulmonary disease exacerbation. Will admit to telemetry. Start IV Solu-Medrol, nebulizers, Rocephin. 2. Acute renal failure, prerenal azotemia. Start IV fluids. Monitor renal functions closely.  3. History of coronary artery disease. Check serial cardiac enzymes, troponin, echo. Cardiology consultation.  4. Hypertension. Continue lisinopril, Metoprolol.  5. Depression. Continue Fluoxetine.  6. Hyperlipidemia. Continue pravastatin.    ____________________________ Tyrone Schimke, MD jsp:ap D: 04/14/2011 00:53:02 ET T: 04/14/2011 09:12:39 ET JOB#: [id number]  cc: Tyrone Schimke, MD, <Dictator> Dory Horn. Eliberto Ivory, MD Tyrone Schimke MD ELECTRONICALLY SIGNED 04/15/2011 12:12

## 2014-08-03 NOTE — Discharge Summary (Signed)
PATIENT NAME:  [name], [name] MR#:  [id number] DATE OF BIRTH:  1933-12-18  DATE OF ADMISSION:  04/14/2011 DATE OF DISCHARGE:  04/15/2011  PRESENTING COMPLAINT: Cough and shortness of breath.   DISCHARGE DIAGNOSES:  1. Acute chronic obstructive pulmonary disease flare, improved.  2. Acute bronchitis.  3. Hypertension.  4. Coronary artery disease.   CONDITION ON DISCHARGE: Fair. Vitals stable. Saturations are 95% on room air.   MEDICATIONS ON DISCHARGE:  1. Zithromax 250 p.o. daily for four more days.  2. Prednisone taper.  3. Lisinopril 10 mg daily.  4. Aspirin 81 mg 2 tablets daily.  5. Diltiazem ER 120 mg daily.  6. Omega-3 fatty acid 2 capsules in the morning.  7. Fluoxetine 20 mg daily.  8. Metoprolol 50 mg daily.  9. Omeprazole 20 mg daily.  10. Pravastatin 20 mg daily.  11. Ferrous sulfate 325 mg p.o. daily.   FOLLOW-UP: Follow-up with Dr. Calla Kicks in 1 to 2 weeks.   LABORATORY DATA ON DISCHARGE: White count 16,000, hemoglobin and hematocrit 12.5 and 38.4, platelet count 220. Glucose 165, BUN 34, creatinine 1.75, sodium 140, potassium 4.5, chloride 108, bicarbonate 17. Hemoglobin A1c 6.2. Lipid profile within normal limits. Cardiac enzymes x3 negative. Echo left ventricle is normal in size, left ventricle ejection fraction is grossly normal with ejection fraction of 50 to 55%, right ventricular systolic function is normal. Left atrium is mildly dilated. Right ventricle systolic pressure is elevated at 30 to 40 mm of mercury. Chest x-ray reveals no focal consolidation or infiltrates, cardiomegaly. EKG shows electronic ventricular pacemaker.   HISTORY OF PRESENT ILLNESS: [name] is pleasant 81 year old Caucasian gentleman with multiple medical problems comes into the Emergency Room with:   1. Acute chronic obstructive pulmonary disease exacerbation. The patient was started on IV steroids. He improved clinically. Saturations are good on room air. He was changed to p.o. taper  and complete p.o. antibiotics with Zithromax.  2. Acute renal failure, prerenal ischemia. The patient improved with IV fluids.  3. Hypertension. Continue lisinopril and metoprolol.  4. Depression. The patient is on fluoxetine.  5. Hyperlipidemia. On pravastatin.  6. History of coronary artery disease. No chest pain. Cardiac enzymes are negative. The patient has a pacemaker.   Hospital stay otherwise remained stable. The patient will be discharged to home with follow-up with Dr. Calla Kicks.   TIME SPENT: 40 minutes.  ____________________________ Hart Rochester Posey Pronto, MD sap:rbg D: 04/15/2011 13:16:57 ET T: 04/18/2011 09:34:26 ET JOB#: [id number]  cc: Bani Gianfrancesco A. Posey Pronto, MD, <Dictator> Dory Horn. Eliberto Ivory, MD Ilda Basset MD ELECTRONICALLY SIGNED 04/26/2011 15:24

## 2014-08-03 NOTE — Op Note (Signed)
PATIENT NAME:  [name], [name] MR#:  [id number] DATE OF BIRTH:  15-Nov-1933  DATE OF PROCEDURE:  08/19/2011  PREOPERATIVE DIAGNOSES:  1. Dupuytren's fascia contracture, left hand and left Marian finger.  2. Left foot plantar fasciitis with two prominent nodules.   POSTOPERATIVE DIAGNOSES:  1. Dupuytren's fascia contracture, left hand and left Franek finger.  2. Left foot plantar fasciitis with two prominent nodules.   PROCEDURE: 1. Excision of diseased Dupuytren's fascia, left hand and left Groeneveld finger.  2. Excision of plantar fascia nodules, left foot.   SURGEON: Christophe Louis, M.D.   ANESTHESIA: General.   COMPLICATIONS: None.   TOURNIQUET TIME: Approximately two hours at the hand and 40 minutes at the foot.   PROCEDURE: The left lower extremity and foot and the left upper extremity and hand are thoroughly prepped with alcohol and ChloraPrep and draped in standard sterile fashion. One gram of Ancef was given intravenously prior to the procedure. At the left lower extremity the extremity is wrapped out with the Esmarch bandage and pneumatic tourniquet elevated to 250 mmHg. The tourniquet is placed about the calf. Under loupe magnification, longitudinal incisions are made over the two nodules in the plantar aspect of the foot. The dissection is carefully carried down, separating the nodules from the skin with the knife and then the scissors used to carefully separate the nodules from the underlying tissues. Both are removed and sent to pathology. The wounds were thoroughly irrigated multiple times. Skin edges are infiltrated with 0.5% Marcaine. The wounds are closed with 4-0 nylon. A soft bulky dressing is applied and the tourniquet is released.  At the left upper extremity, the extremity is wrapped out with the Esmarch bandage and pneumatic tourniquet elevated to 300 mmHg. Under loupe magnification, a standard volar zigzag incision is made extending from the palm out to the distal  aspect of the middle phalanx. The skin flaps are carefully elevated. The neurovascular bundles are then identified proximally and followed out all the way to the middle phalanx, distal to all of the pathology. The Devaux finger ulnar digital nerve is extremely encased in diseased fascia and was extremely difficult to extricate and identify. After isolating the neurovascular bundles the diseased fascia is completely excised. Careful palpation demonstrates no residual fascia. There is still a residual flexion contracture at the PIP joint and therefore the volar plate is incised at that joint. The wound is thoroughly irrigated multiple times. A longitudinal vessel loop drain is placed. Skin edges are closed carefully with 5-0 nylon. A soft bulky dressing is applied. The tourniquet is released. Capillary refill returns immediately to the finger. A soft bulky dressing is applied and the patient is returned to the recovery room in satisfactory condition having tolerated the procedure quite well.    ____________________________ Robert Mallow, MD ces:bjt D: 08/20/2011 10:40:04 ET T: 08/20/2011 16:02:54 ET JOB#: [id number]  cc: Robert Mallow, MD, <Dictator> Robert Mallow MD ELECTRONICALLY SIGNED 08/22/2011 8:49

## 2014-08-03 NOTE — Consult Note (Signed)
General Aspect 81 year old gentleman with a history of coronary artery disease, bypass surgery in 1999 with catheterization in 2007 with patent LIMA to the LAD, occluded vein graft to the OM, history of Cypher stent to the PL branch and mid RCA in September 07, ejection fraction 45-50%, chronic atrial fibrillation, h/o of heavy ETOH and falls (none recently?),  Pacemaker was placed by Dr. Elisabeth Cara on 25 2010 for syncope, AFib with pauses. He is status post right knee replacement. Cardiology was consulted for SOB, possible CHF. he was last seen in clinic 03/25/2011 and was doing well at that time.   He reports having worsening SOB over the past few weeks. "I tried to get through it". He has had anorexia over the past two weeks. Yesterday, breathing got worse with some sputum production. His chest felt sore from congestion. Symptoms are exacerbated by lying flat.    Present Illness PAST MEDICAL HISTORY:  1. Atrial fibrillation.  2. Myocardial infarction x3.  3. Coronary artery bypass graft.  4. Hypercholesterolemia.  5. Hypertension. 6. Aortic aneurysm.  7. Pacemaker. 8. Left hip replacement.  9. Cardiac stent.   ALLERGIES: Lipitor.   CURRENT MEDICATIONS:  1. Aspirin 81 mg p.o. daily.  2. Diltiazem 120 mg p.o. daily.  3. Ferrous sulfate 325 mg p.o. daily.  4. Fluoxetine 20 mg p.o. daily.  5. Lisinopril 10 mg p.o. daily.  6. Metoprolol 50 mg p.o. in a.m.  7. Omega 3 polyunsaturated fatty acids 1,200 mg 2 p.o. daily.  8. Omeprazole 20 mg p.o. daily.  9. Pravastatin 20 mg p.o. daily.   SOCIAL HISTORY: The patient denies tobacco abuse. He quit drinking a few years ago. He denies drug abuse. He lives with his wife.   FAMILY HISTORY: Negative for diabetes, stroke.   Physical Exam:   GEN WD, WN, Appears uncomfortable, breathing hard    HEENT hearing intact to voice, moist oral mucosa    NECK supple     RESP rhonchi  on the right     CARD Regular rate and rhythm  No murmur     ABD  denies tenderness  soft     LYMPH negative neck    EXTR negative edema    SKIN normal to palpation    NEURO motor/sensory function intact    PSYCH alert, A+O to time, place, person, good insight   Review of Systems:   Subjective/Chief Complaint SOB    General: Weight loss or gain  Fatigue  Weakness  anorexia     Skin: No Complaints     ENT: No Complaints     Eyes: No Complaints     Neck: No Complaints     Respiratory: Frequent cough  Short of breath  Wheezing  Sputum     Cardiovascular: Dyspnea     Gastrointestinal: No Complaints     Genitourinary: No Complaints     Vascular: No Complaints     Musculoskeletal: No Complaints     Neurologic: No Complaints     Hematologic: No Complaints     Endocrine: No Complaints     Psychiatric: No Complaints     Review of Systems: All other systems were reviewed and found to be negative     Medications/Allergies Reviewed Medications/Allergies reviewed           Admit Diagnosis:   ACUTE COPD EXACERBATION: 14-Apr-2011, Active, ACUTE COPD EXACERBATION    Routine Chem:  02-Jan-13 20:16    Glucose, Serum 98   BUN 34  Creatinine (comp) 1.79   Sodium, Serum 138   Potassium, Serum 4.5   Chloride, Serum 106   CO2, Serum 20   Calcium (Total), Serum 9.0   Anion Gap 12   Osmolality (calc) 283   eGFR (African American) 48   eGFR (Non-African American) 40  Routine Hem:  02-Jan-13 20:16    WBC (CBC) 10.3   RBC (CBC) 4.31   Hemoglobin (CBC) 13.5   Hematocrit (CBC) 40.6   Platelet Count (CBC) 217   MCV 94   MCH 31.2   MCHC 33.2   RDW 17.8  Cardiac:  02-Jan-13 20:16    Troponin I < 0.02   CK, Total 152   CPK-MB, Serum 1.3  03-Jan-13 04:14    Troponin I < 0.02   CK, Total 114   CPK-MB, Serum 1.3   EKG:   Interpretation EKG shows paced rhythm at 72 bpm   Radiology Results:  XRay:    02-Jan-13 21:00, Chest Portable Single View   Chest Portable Single View    PRELIMINARY REPORT    The following is a  PRELIMINARY Radiology report.  A final report will follow pending radiologist verification.      REASON FOR EXAM:    sob  COMMENTS:       PROCEDURE: DXR - DXR PORTABLE CHEST SINGLE VIEW  - Apr 13 2011  9:00PM     RESULT:     Frontal view of the chest is performed. Comparison is made to a prior   study dated 09/17/2010.    The patient has taken a shallow inspiration. With technique taken into   consideration, no focal regions of consolidation are appreciated. The   cardiac silhouette is enlarged. The patient is status post median   sternotomy and coronary artery bypass grafting. A left sided pectoralis   pacing unit is appreciated with the tip projecting in the region of the     right atrium and right ventricle.    IMPRESSION:      1.  No focal regions of consolidation of focal infiltrates.  2.  Cardiomegaly.      Thank you for this opportunity to contribute to the care of your patient.           Dictated By: Mikki Santee, M.D., MD    Lipitor: Unknown  Vital Signs/Nurse's Notes:  **Vital Signs.:   03-Jan-13 09:17   Vital Signs Type Routine   Temperature Temperature (F) 97.3   Celsius 36.2   Temperature Source oral   Pulse Pulse 102   Pulse source per Dinamap   Respirations Respirations 20   Systolic BP Systolic BP 286   Diastolic BP (mmHg) Diastolic BP (mmHg) 71   Mean BP 87   BP Source Dinamap   Pulse Ox % Pulse Ox % 94   Pulse Ox Activity Level  At rest   Oxygen Delivery 2L     Impression 81 year old gentleman with a history of coronary artery disease, bypass surgery in 1999 with catheterization in 2007 with patent LIMA to the LAD, occluded vein graft to the OM, history of Cypher stent to the PL branch and mid RCA in September 07, ejection fraction 45-50%, chronic atrial fibrillation, h/o of heavy ETOH and falls (none recently?),  Pacemaker was placed by Dr. Elisabeth Cara on 25 2010 for syncope, AFib with pauses. He is status post right knee replacement.    A/P: 1) SOB: Presentation concerning for URI, possible bronchitis. He has had poor po fluid intake recently  and creatinine is elevated consistent with hypovolemia --He is on ABx, nebs --Would hold IVF and hold lasix  2) CAD: Cardiac enz negative so far.  Would continue rule out Echo pending.  3)atrial fib: will contrinue pradaxa, will monitor heart rate. Rate is higher this AM, greater than 100, likely secondary to new  lung process and SOB.  4) ETOH: previous hx, on last visit he was not drinking per the family, no recent falls.   Electronic Signatures: Ida Rogue (MD)  (Signed 03-Jan-13 10:17)  Authored: General Aspect/Present Illness, History and Physical Exam, Review of System, Health Issues, Home Medications, Labs, EKG , Radiology, Allergies, Vital Signs/Nurse's Notes, Impression/Plan   Last Updated: 03-Jan-13 10:17 by Ida Rogue (MD)

## 2014-08-06 ENCOUNTER — Ambulatory Visit (INDEPENDENT_AMBULATORY_CARE_PROVIDER_SITE_OTHER): Payer: Commercial Managed Care - HMO | Admitting: Cardiovascular Disease

## 2014-08-06 ENCOUNTER — Encounter: Payer: Self-pay | Admitting: Cardiovascular Disease

## 2014-08-06 VITALS — BP 140/80 | HR 81 | Ht 73.0 in | Wt 240.8 lb

## 2014-08-06 DIAGNOSIS — I2510 Atherosclerotic heart disease of native coronary artery without angina pectoris: Secondary | ICD-10-CM

## 2014-08-06 DIAGNOSIS — N183 Chronic kidney disease, stage 3 unspecified: Secondary | ICD-10-CM

## 2014-08-06 DIAGNOSIS — I5032 Chronic diastolic (congestive) heart failure: Secondary | ICD-10-CM

## 2014-08-06 DIAGNOSIS — I25810 Atherosclerosis of coronary artery bypass graft(s) without angina pectoris: Secondary | ICD-10-CM | POA: Diagnosis not present

## 2014-08-06 DIAGNOSIS — I482 Chronic atrial fibrillation, unspecified: Secondary | ICD-10-CM

## 2014-08-06 DIAGNOSIS — E785 Hyperlipidemia, unspecified: Secondary | ICD-10-CM

## 2014-08-06 NOTE — Assessment & Plan Note (Signed)
No anginal symptoms on today's visit

## 2014-08-06 NOTE — Patient Instructions (Signed)
You are doing well. No medication changes were made.  We will check blood today  Please call us if you have new issues that need to be addressed before your next appt.  Your physician wants you to follow-up in: 6 months.  You will receive a reminder letter in the mail two months in advance. If you don't receive a letter, please call our office to schedule the follow-up appointment.

## 2014-08-06 NOTE — Assessment & Plan Note (Signed)
Currently with no symptoms of angina. No further workup at this time. Continue current medication regimen. 

## 2014-08-06 NOTE — Assessment & Plan Note (Signed)
Cholesterol is at goal on the current lipid regimen. No changes to the medications were made.  

## 2014-08-06 NOTE — Progress Notes (Signed)
Patient ID: [name], male    DOB: 1934/02/01, 81 y.o.   MRN: [id number]  HPI Comments: 81 year old gentleman with a history of coronary artery disease, bypass surgery in 1999 with catheterization in 2007 with patent LIMA to the LAD, occluded vein graft to the OM, history of Cypher stent to the PL branch and mid RCA in September 07, ejection fraction 45-50%, chronic atrial fibrillation with [employer] pacemaker, hx of previous recurrent falls and injury from alcohol abuse (stopped drinking more recently), Pacemaker was placed by [facility] vascular in 2010 for syncope, AFib with pauses. He presents for routine followup of his blood pressure  . 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  In follow-up today, weight is relatively stable. He takes torsemide as needed for shortness of breath Gen. 2016 had a fall, broke his right ankle, required 3 screws. Has had mild swelling in that leg since that time. Now walking without a walker or cane. Reports having elevated creatinine starting in January, saw nephrology. Unhappy as he received a large bill. Medication changes made, had follow-up with Dr. Caryl Comes, started on diltiazem for blood pressure. Overall has felt well. Blood pressure at home well-controlled He is concerned as his pradaxa is double the cost, now $97 per month  EKG on today's visit shows paced rhythm, rate 81 bpm, no significant ST or T-wave changes Lab work reviewed with him from 04/15/2014 showing creatinine 1.9, BUN 32  Other past medical history Previous hospitalization for malaise, dehydration, creatinine greater than 3. Lisinopril and Lasix was discontinued.   Previous echocardiogram  showed mild to moderate pulmonary hypertension. Creatinine of 2.   History of positive sleep study, was on CPAP for several months. Recent change to his insurance now has no coverage  Previous lab work from 10/23/2012 shows total cholesterol 212,  LDL 142, HDL 50, creatinine 1.7, BUN 22 He is status post right knee replacement.     Allergies  Allergen Reactions  . Atorvastatin     Outpatient Encounter Prescriptions as of 08/06/2014  Medication Sig  . dabigatran (PRADAXA) 75 MG CAPS capsule Take 1 capsule (75 mg total) by mouth 2 (two) times daily.  [name] diltiazem (CARDIZEM CD) 120 MG 24 hr capsule Take 1 capsule (120 mg total) by mouth daily.  [name] donepezil (ARICEPT) 5 MG tablet Take 5 mg by mouth at bedtime.  . metoprolol succinate (TOPROL-XL) 100 MG 24 hr tablet Take 1 tablet (100 mg total) by mouth daily.  [name] omeprazole (PRILOSEC) 20 MG capsule Take 20 mg by mouth daily.    . simvastatin (ZOCOR) 20 MG tablet Take 1 tablet (20 mg total) by mouth at bedtime.  . Tamsulosin HCl (FLOMAX) 0.4 MG CAPS Take 0.4 mg by mouth daily after supper.  . torsemide (DEMADEX) 20 MG tablet Take 20 mg by mouth once.    Past Medical History  Diagnosis Date  . Chronic atrial fibrillation     a. on pradaxa  . Coronary artery disease     a. s/p MI x 3;  b. s/p CABG x2 in 1999;  c. Cath 2007: LIMA->LAD patent, VG->OM 100, PCI/DES of RPL/mid RCA (cypher DES).  . Chronic obstructive pulmonary disease   . GERD (gastroesophageal reflux disease)   . Hyperlipidemia   . Hypertension   . PVD (peripheral vascular disease)   . AAA (abdominal aortic aneurysm)     a. s/p repair - 1999 @ [facility] @ time of CABG.  . Chronic diastolic  CHF (congestive heart failure)     a. 01/2012 Echo:  EF 55%.  . Tachy-brady syndrome     a. s/p PPM 2010.  [name] CKD (chronic kidney disease), stage III     a. Acute on chronic 03/2012  . Cancer     skin cancer nose  . Sleep apnea   . Broken ankle     Past Surgical History  Procedure Laterality Date  . Coronary artery bypass graft    . Abdominal aortic aneurysm repair    . Insert / replace / remove pacemaker    . Foot surgery      left foot surgery to remove cyst.  . Trigger finger release    . Skin cancer biopsy      nose   . Ankle surgery Right     Social History  reports that he quit smoking about 17 years ago. He has never used smokeless tobacco. He reports that he does not drink alcohol or use illicit drugs.  Family History family history includes Cerebral aneurysm in his father; Coronary artery disease in his father and mother; Diabetes in his father and mother; Hypertension in his father and mother; Stomach cancer in his mother.        Review of Systems  Constitutional: Negative.   Respiratory: Negative.   Cardiovascular: Negative.   Gastrointestinal: Negative.   Musculoskeletal: Positive for gait problem.  Skin: Negative.   Neurological: Negative.   Hematological: Negative.   Psychiatric/Behavioral: Negative.   All other systems reviewed and are negative.   BP 140/80 mmHg  Pulse 81  Ht 6\' 1"  (1.854 m)  Wt 240 lb 12 oz (109.203 kg)  BMI 31.77 kg/m2  Physical Exam  Constitutional: He is oriented to person, place, and time. He appears well-developed and well-nourished.  HENT:  Head: Normocephalic.  Nose: Nose normal.  Mouth/Throat: Oropharynx is clear and moist.  Eyes: Conjunctivae are normal. Pupils are equal, round, and reactive to light.  Neck: Normal range of motion. Neck supple. No JVD present.  Cardiovascular: Normal rate, regular rhythm, S1 normal, S2 normal, normal heart sounds and intact distal pulses.  Exam reveals no gallop and no friction rub.   No murmur heard. Pulmonary/Chest: Effort normal and breath sounds normal. No respiratory distress. He has no wheezes. He has no rales. He exhibits no tenderness.  Abdominal: Soft. Bowel sounds are normal. He exhibits no distension. There is no tenderness.  Musculoskeletal: Normal range of motion. He exhibits no edema or tenderness.  Lymphadenopathy:    He has no cervical adenopathy.  Neurological: He is alert and oriented to person, place, and time. Coordination normal.  Skin: Skin is warm and dry. No rash noted. No erythema.   Psychiatric: He has a normal mood and affect. His behavior is normal. Judgment and thought content normal.      Assessment and Plan   Nursing note and vitals reviewed.

## 2014-08-06 NOTE — Assessment & Plan Note (Signed)
Long-standing renal dysfunction. Typically gets worse with overdiuresis. Instructed him to take Lasix only when necessary for shortness of breath

## 2014-08-06 NOTE — Assessment & Plan Note (Signed)
He continues to take torsemide when necessary. Instructed him not to take excessive diuretics given his renal dysfunction that typically gets worse with overdiuresis

## 2014-08-06 NOTE — Assessment & Plan Note (Signed)
Recommended that he stay on his anticoagulation. Heart rate well controlled

## 2014-08-07 LAB — HEPATIC FUNCTION PANEL
ALBUMIN: 4.4 g/dL (ref 3.5–4.8)
ALT: 16 IU/L (ref 0–44)
AST: 20 IU/L (ref 0–40)
Alkaline Phosphatase: 74 IU/L (ref 39–117)
Bilirubin Total: 0.5 mg/dL (ref 0.0–1.2)
Bilirubin, Direct: 0.13 mg/dL (ref 0.00–0.40)
Total Protein: 6.9 g/dL (ref 6.0–8.5)

## 2014-08-07 LAB — LIPID PANEL
CHOL/HDL RATIO: 2.6 ratio (ref 0.0–5.0)
CHOLESTEROL TOTAL: 140 mg/dL (ref 100–199)
HDL: 53 mg/dL (ref >39)
LDL CALC: 69 mg/dL (ref 0–99)
Triglycerides: 90 mg/dL (ref 0–149)
VLDL Cholesterol Cal: 18 mg/dL (ref 5–40)

## 2014-08-07 LAB — BASIC METABOLIC PANEL
BUN / CREAT RATIO: 11 (ref 10–22)
BUN: 21 mg/dL (ref 8–27)
CHLORIDE: 105 mmol/L (ref 97–108)
CO2: 21 mmol/L (ref 18–29)
Calcium: 9.1 mg/dL (ref 8.6–10.2)
Creatinine, Ser: 1.91 mg/dL — ABNORMAL HIGH (ref 0.76–1.27)
GFR calc Af Amer: 38 mL/min/{1.73_m2} — ABNORMAL LOW (ref >59)
GFR calc non Af Amer: 33 mL/min/{1.73_m2} — ABNORMAL LOW (ref >59)
Glucose: 98 mg/dL (ref 65–99)
Potassium: 4.8 mmol/L (ref 3.5–5.2)
Sodium: 143 mmol/L (ref 134–144)

## 2014-08-10 NOTE — Consult Note (Signed)
PATIENT NAME:  [name], [name] MR#:  [id number] DATE OF BIRTH:  21-May-1933  DATE OF CONSULTATION:  04/27/2014  REFERRING PHYSICIAN:   CONSULTING PHYSICIAN:  Claud Kelp, MD  CHIEF COMPLAINT: Right ankle pain.   HISTORY OF PRESENT ILLNESS: Mr. Kasin is a 81 year old male with an extensive medical history who slipped and fell on the ice this morning while going out to get his paper with immediate pain and deformity and inability to bear weight on his right ankle. He denies any head trauma or loss of consciousness when he slipped and fell.   PAST MEDICAL HISTORY: Significant for atrial fibrillation, multiple myocardial infarctions, hypertension, hypercholesterolemia.   PAST SURGICAL HISTORY: Significant for CABG in 1999 as well as aortic aneurysm repair in 1999. He has also had a pacemaker placement, hip and knee replacements and cardiac stent placements.   MEDICATIONS: Include torsemide 20 mg b.i.d., tamsulosin 0.4 mg daily, Pradaxa 150 mg b.i.d., omeprazole 20 mg daily, metoprolol 100 mg daily, furosemide 20 mg b.i.d, Crestor 5 mg daily and amlodipine 5 mg daily.   ALLERGIES: INCLUDE LIPITOR.  REVIEW OF SYSTEMS: The patient denies any headache, blurry vision, double vision, tinnitus, difficulty swallowing, cough, chest pain, shortness of breath, diarrhea, nausea or vomiting.   PHYSICAL EXAMINATION:  GENERAL: He is an alert and oriented 81 year old male who appears to be in no acute distress. EXTREMITIES: His right lower extremity has already been immobilized in a splint by the ER physician, but he has appropriate capillary refill to his toes, has intact sensation to his toes and is able to wiggle his toes on command. He denies any pain about the left lower or bilateral upper extremities.   RADIOGRAPHIC DATA: Radiographs taken of his right ankle demonstrate a trimalleolar ankle fracture-dislocation.   ASSESSMENT: A 81 year old male with an unstable ankle fracture-dislocation.   PLAN:  The patient will be seen by the hospitalist service for risk stratification and medical optimization with plan for open reduction/internal fixation of his right ankle when he is medically cleared today.    ____________________________ Claud Kelp, MD tte:TT D: 04/27/2014 10:44:33 ET T: 04/27/2014 16:43:36 ET JOB#: [id number]  cc: Claud Kelp, MD, <Dictator> Claud Kelp MD ELECTRONICALLY SIGNED 04/27/2014 21:10

## 2014-08-10 NOTE — H&P (Signed)
   Subjective/Chief Complaint Right ankle pain and deformity   History of Present Illness 81 y/o male with MMP who slipped and fell this AM with immediate pain, deformity, and inability to bear weight   Past History MI x 3 Aortic Aneurysm CABG Cardiac Stent  Pacemaker HTN HLD   Past Medical Health Coronary Artery Disease, Hypertension   Code Status Full Code   Past Med/Surgical Hx:  Atrial Fibrillation:   MI x 3:   CABG II:   Hypercholesterolemia:   hypertension:   Aortic Aneurysm:   Knee Replacement:   Pacemaker:   Hip Replacement - Left:   Stent - Cardiac:   open heart surgery:   ALLERGIES:  Lipitor: Unknown  HOME MEDICATIONS: Medication Instructions Status  Pradaxa 150 mg oral capsule 1 cap(s) orally 2 times a day Active  omeprazole 20 mg oral delayed release capsule 1 cap(s) orally once a day. Active  metoprolol 100 mg oral tablet, extended release 1 tab(s) orally once a day Active  tamsulosin 0.4 mg oral capsule 1 cap(s) orally once a day (at bedtime) Active  torsemide 20 mg oral tablet 1 tab(s) orally 2 times a day Active  amLODIPine 5 mg oral tablet 1 tab(s) orally once a day Active  donepezil 5 mg oral tablet 1 tab(s) orally once a day (at bedtime) Active  simvastatin 20 mg oral tablet 1 tab(s) orally once a day (at bedtime) Active   Family and Social History:  Family History Non-Contributory   Place of Living Home   Review of Systems:  Fever/Chills No   Cough No   Sputum No   Abdominal Pain No   Diarrhea No   Constipation No   Nausea/Vomiting No   SOB/DOE No   Chest Pain No   Physical Exam:  GEN well developed   RESP normal resp effort   CARD regular rate   ABD soft   EXTR splint to RLE    Assessment/Admission Diagnosis Right trimalleolar ankle fracture dislocation   Plan Medical clearance obtained from hospitalist service. Will proceed to OR for ORIF   Electronic Signatures: Claud Kelp (MD)  (Signed 17-Jan-16  11:37)  Authored: CHIEF COMPLAINT and HISTORY, PAST MEDICAL/SURGIAL HISTORY, ALLERGIES, HOME MEDICATIONS, FAMILY AND SOCIAL HISTORY, REVIEW OF SYSTEMS, PHYSICAL EXAM, ASSESSMENT AND PLAN   Last Updated: 17-Jan-16 11:37 by Claud Kelp (MD)

## 2014-08-10 NOTE — Discharge Summary (Signed)
PATIENT NAME:  [name], [name] MR#:  [id number] DATE OF BIRTH:  1933-06-23  DATE OF ADMISSION:  04/28/2014 DATE OF DISCHARGE:  05/01/2014  ADDENDUM TO EARLIER DICTATED DISCHARGE SUMMARY ON 04/29/2014, BY JON WOLFE, PA.   FINAL DIAGNOSES:  1. Ankle fracture status post repair.  2. Acute on chronic renal failure, improving gradually.  3. History of coronary artery disease chronic systolic congestive heart failure.  4. Hypertension. 5. Hyperlipidemia.  The patient had some worsening renal function because of his continued use of diuretics. We had to keep him in the hospital a few more days until the kidney function started improving, which is improving now, and we suggested for him to have follow-up with the nephrology clinic, Dr. Candiss Norse, as outpatient to check his kidney function frequently. Also advised him about fluid restriction and the use of diuretics, only as needed basis. He understood and agreed for that.   For further details, please see discharge summary done by Vance Peper, PA.   TOTAL TIME SPENT ON DISCHARGE: 40 minutes.    ____________________________ Ceasar Lund Anselm Jungling, MD vgv:JT D: 05/01/2014 12:45:00 ET T: 05/01/2014 14:16:05 ET JOB#: [id number]  cc: Ceasar Lund. Anselm Jungling, MD, <Dictator> Vaughan Basta MD ELECTRONICALLY SIGNED 05/05/2014 23:17

## 2014-08-10 NOTE — Op Note (Signed)
PATIENT NAME:  [name], [name] MR#:  [id number] DATE OF BIRTH:  07-15-1933  DATE OF PROCEDURE:  04/27/2014  PREOPERATIVE DIAGNOSIS: Right ankle fracture/dislocation.   POSTOPERATIVE DIAGNOSIS: Right ankle fracture/dislocation.   PROCEDURE PERFORMED: Open reduction internal fixation, right ankle fracture.   SURGEON OF RECORD: Stacy Gardner, MD   ANESTHESIA: General.   FLUIDS ADMINISTERED: 1200 of crystalloid.   ESTIMATED BLOOD LOSS: 20  mL.   TOTAL TOURNIQUET TIME: 66 minutes.   PREOP ANTIBIOTICS: 2 grams Ancef.   COMPLICATIONS: None.   DISPOSITION: Stable to PACU upon transport.   INDICATIONS FOR PROCEDURE: This is a 81 year old male, who fell this morning, sustaining a trimalleolar ankle fracture/dislocation. He and his wife were counseled regarding risks and benefits of surgery with the risks to include, but not limited to, bleeding, infection, damage to nerves and/or vessels, malunion, nonunion, the need for additional surgical procedures. They agreed to proceed with surgery after weighing the risks.   DESCRIPTION OF PROCEDURE: The patient was brought to the operating room and placed on the operating table with all extremities appropriately padded. The right leg was cleansed with alcohol, dried ,then prepped and draped in sterile fashion. A timeout was then performed to anesthesia, nursing staff, and surgeon of record to confirm surgical site, patient, medical record number, date of birth, laterality, procedure to be performed, confirmation that antibiotics were administered, and all necessary equipment was in the room. We next exsanguinated the right lower extremity with an Esmarch bandage and inflated the tourniquet to 250 mm/Hg. A lateral incision was made overlying the distal fibula with dissection down to the fibula. There is an oblique comminuted fibular fracture. Upon wound exploration, it appeared there was a partial laceration of the superficial peroneal nerve, caused by a  fracture spike of bone. A Prolene suture was used to perform a repair of the partial nerve laceration. I then proceeded to clean out the fracture site and reduce the fibular fracture and hold this in place with a reduction clamp. Reduction was confirmed in AP and lateral images. I then placed two 2.7 mm screws in lag fashion to obtain compression across the fracture site. We next placed a 10 hole 1/3 tubular plate laterally and fixed this with a 3 nonlocking cortical screws proximally, and then a combination distally of 1 cancellous screw distally, a locking screw, and a 3rd cortical screw. Fracture reduction and hardware placement was confirmed on AP and lateral fluoroscopic images. We then made a medial incision, dissected down to the medial malleolar fracture site, reduced this with a reduction clamp, and placed the guidewire, guide pins for two 4-0 partially threaded cannulated screws. Two 50 mm cannulated screws were placed uneventfully. Again, final x-rays were taken to confirm hardware placement. We then returned the lateral wound,  were we removed the distal cortical screw and placed a large Weber clamp to reduce syndesmosis and exchange the screw for syndesmotic screw for further stabilization. The clamp was removed and again, AP and lateral final images were taken. Both wounds were copiously irrigated with saline and then closed with 2-0 Vicryl deep followed by 3-0 Monocryl and then running 3-0 nylon suture. 10 mL of 0.25% Marcaine was injected into the wound sites for local anesthesia. Sterile dressing was then applied. The patient was placed in a well-padded  splint and transferred to the PACU in stable condition.    ____________________________ Claud Kelp, MD tte:ap D: 04/27/2014 15:31:09 ET T: 04/27/2014 16:54:37 ET JOB#: [id number]  cc: Claud Kelp, MD, <  Dictator> Claud Kelp MD ELECTRONICALLY SIGNED 04/27/2014 21:12

## 2014-08-10 NOTE — H&P (Signed)
PATIENT NAME:  [name], [name] MR#:  [id number] DATE OF BIRTH:  28-Apr-1933  PRIMARY CARE PROVIDER: Irven Easterly. Kary Kos, MD   CHIEF COMPLAINT: Fall with right ankle pain.   REASON FOR CONSULTATION: Preop evaluation, congestive heart failure, atrial fibrillation, on Pradaxa.   CONSULTING PHYSICIAN: Claud Kelp, MD   HISTORY OF PRESENTING ILLNESS: A 81 year old Caucasian male patient with history of coronary artery disease status post CABG and aortic aneurysm repair, chronic systolic congestive heart failure, paroxysmal atrial fibrillation on Pradaxa, presents to the emergency room after he slipped on some ice, landing on his right ankle with severe pain. Here, he has been found to have right ankle fracture and is being taken to the operating room by orthopedics.  A preop evaluation has been requested.   The patient had a stress test in June of 2015, which was normal. This was done as preop check. He went through some contracture surgery, which he did well with without any complications. He has been taking Pradaxa regularly for his atrial fibrillation. He mentioned that he has chronic shortness of breath, which is unchanged. He does not get any chest pain when he walks, although his functional status seems to be limited.   PAST MEDICAL HISTORY:  1. History of atrial fibrillation.  2. MI x3.  3. CABG in 1999.  4. Hyperlipidemia.  5. Hypertension.  6. Aortic aneurysm repair in 1999.  7. Pacemaker.  8. Left hip replacement.  9. Cardiac stents.  10. Chronic systolic and diastolic heart failure.  11. CKD stage 3.   ALLERGIES: LIPITOR.   SOCIAL HISTORY: The patient does not smoke. He quit alcohol many years back. He lives with his wife. He ambulates on his own and walker sometimes.   FAMILY HISTORY: Negative for any diabetes or stroke.   REVIEW OF SYSTEMS:  CONSTITUTIONAL: No fever, fatigue.  EYES: No blurred vision, pain.  EARS, NOSE, AND THROAT: No tinnitus, ear pain, RESPIRATORY: No  cough, wheeze. Has chronic shortness of breath.  CARDIOVASCULAR: No chest pain, orthopnea. Has on and off edema.  GASTROINTESTINAL: No nausea, vomiting, diarrhea, or abdominal pain.  GENITOURINARY: No dysuria, hematuria, or frequency.  ENDOCRINE: No polyuria, nocturia, thyroid problems.  HEMATOLOGIC AND LYMPHATIC: No anemia, easy bruising.  INTEGUMENTARY: No acne, rash, lesion.  MUSCULOSKELETAL: Has pain in his right ankle, some on and off back pain occasionally. No focal numbness, weakness.  PSYCHIATRIC: No anxiety or depression.   HOME MEDICATIONS:  1. Amlodipine 5 mg daily.  2. Donepezil 5 mg daily.  3. Metoprolol 100 mg daily.  4. Omeprazole 20 mg daily.  5. Pradaxa 150 mg 2 times a day.  6. Simvastatin 20 mg daily.  7. Tamsulosin 0.4 mg oral once a day.  8. Torsemide 20 mg 2 times a day.   PHYSICAL EXAMINATION:  VITAL SIGNS: Temperature 97.6, pulse of 74, blood pressure 141/93, saturating 96% on room air.  GENERAL: Elderly Caucasian male patient lying in bed in mild distress secondary to the pain.  PSYCHIATRIC: Alert and oriented x3. Mood and affect appropriate. Judgment intact.  HEENT: Atraumatic, normocephalic. Oral mucosa moist and pink. External ears and nose normal. No pallor or icterus. Pupils bilaterally equal and reactive to light.  NECK: Supple. No thyromegaly. No palpable lymph nodes. Trachea midline. No carotid bruit or JVD.  CARDIOVASCULAR: S1, S2  systolic murmur. Peripheral pulses 2+. No edema.  RESPIRATORY: Normal work of breathing. Clear to auscultation on both sides. GASTROINTESTINAL: Soft abdomen, nontender. Bowel sounds present. No organomegaly palpable.  SKIN: Warm and dry. No petechiae, rash, ulcers.  MUSCULOSKELETAL: No joint swelling, redness, or effusionof large joints, except the right ankle, which has swelling, deformity, and tenderness, decreased range of motion.  NEUROLOGICAL: Motor strength 5/5 in upper and lower extremities.  INTEGUMENT: Intact all  over.  LYMPHATIC: No cervical lymphadenopathy.   LABORATORY STUDIES: Glucose 92, BUN 21, creatinine 1.83, sodium 141, potassium 4.9. AST, ALT, alkaline phosphatase, bilirubin normal. WBC 7.1, hemoglobin 12.9, platelets 175,000 with INR 1.3.  Right ankle x-ray shows bimalleolar fracture subluxation.   EKG shows a paced rhythm.   ASSESSMENT AND PLAN:  1. Right ankle fracture, which needs surgery. The patient is going to the operating room today in an hour or 2. The case was discussed with Dr. Daine Gip of orthopedics. The patient will be a low to moderate risk considering his multiple comorbidities, which are chronic. I do not see any acute abnormalities at this time. He has had a stress test 6 months back, which was normal. His functional status has not changed. Since the patient is on Pradaxa, we will give him Praxbind for Pradaxa reversal. This needs to be monitored. The patient can be re-started on Pradaxa after surgery. We will watch out for any complications for surgery. We will follow the patient along with you.  2. Chronic systolic congestive heart failure. He can continue the torsemide. No signs of fluid overload.  3. Coronary artery disease, stable.  4. Atrial fibrillation. Continue the patient's metoprolol. He has a paced rhythm at this time. Restart Pradaxa at the earliest possible time.   Time spent today on this consult was 45 minutes.     ____________________________ Leia Alf Teresina Bugaj, MD srs:ap D: 04/27/2014 14:02:02 ET T: 04/27/2014 15:01:49 ET JOB#: [id number]  cc: Alveta Heimlich R. Loney Domingo, MD, <Dictator> Irven Easterly. Kary Kos, MD Claud Kelp, MD  Neita Carp MD ELECTRONICALLY SIGNED 04/27/2014 18:43

## 2014-08-10 NOTE — Discharge Summary (Signed)
PATIENT NAME:  [name], KELLMAN MR#:  [id number] DATE OF BIRTH:  05-03-1933  DATE OF ADMISSION:  04/27/2014 DATE OF DISCHARGE:  04/29/2014  ADMITTING DIAGNOSIS: Right ankle fracture/dislocation.   DISCHARGE DIAGNOSIS: Right ankle fracture/dislocation.   ADMITTING SURGEON: Dr. Daine Gip, locum on call for the weekend.  CONSULTANTS: Dr. Skip Estimable as well as Dr. Darvin Neighbours.    HISTORY: The patient is a 81 year old male who on the day of admission fell and slipped on some ice landing on the right ankle and immediately had severe pain. He was unable to bear weight or ambulate. Subsequently, he was brought to [facility] via EMS. X-rays taken in the Emergency Room showed a fracture-dislocation of the right ankle. Subsequently, after discussion of the risks and benefits of surgery, the patient agreed to proceed with surgery. Medical clearance was obtained prior to surgery.   PROCEDURE: Open reduction and internal fixation of right ankle, repair of a partial superficial peroneal nerve using Prolene suture.   ANESTHESIA: General.   IMPLANTS: Two 2.7 mm screws in a lag fashion, a 10 hole one-third tubular plate fixed with 3 locking cortical screws proximally and then a combination distally of 1 cancellus screw distally, a locking screw and a third cortical screw.   HOSPITAL COURSE: The patient tolerated the procedure very well. He had no complications. He was then taken to the PAC-U where he was stabilized and then transferred to the orthopedic floor. He began receiving anticoagulation therapy of Pradaxa, which he was on prior to surgery. The right leg was elevated on 2 pillows. Polar Care was applied maintaining a temperature of 40 to 50 degrees Fahrenheit. He has been able to move his toes well. Sensation to light touch has been intact and within normal limits. Vital signs have been stable. He has been afebrile. Hemodynamically he was stable and no transfusions were given. Physical therapy  was initiated on day 1 for gait training and transfers. This has been extremely slow. Occupational therapy was also initiated on day 1 for ADL and assistive devices. The patient was encouraged to use incentive spirometer. His O2 levels were slightly low during the hospital course. Lungs however were clear to auscultation.   DISPOSITION: The patient is being discharged to a skilled nursing facility in improved stable condition.   DISCHARGE INSTRUCTIONS: He is to be nonweightbearing to the right lower extremity. Elevate the right lower extremity on 2 pillows. Continue using the Polar Care maintaining a temperature of 40 to 50 degrees Fahrenheit. TED stockings to the nonoperative leg. Encourage cough and deep breathing q. 2 hours while awake. Incentive spirometer q. 1 hour while awake. He is placed on a regular diet. He will need to follow-up in the clinic in 7 to 10 days. Continue PT for gait training and transfers. OT for ADL and assistive devices. He is placed on a regular diet.   DRUG ALLERGIES: LIPITOR.  MEDICATIONS: Norvasc 5 mg daily, Colace 100 mg b.i.d., Senokot-S 1 tablet b.i.d., Aricept 5 mg at bedtime, Toprol-XL 100 mg daily, pantoprazole 40 mg daily, Zocor 20 mg at bedtime, Flomax 0.4 mg at bedtime, Pradaxa 150 mg b.i.d., Tylenol ES 325 at 650 mg q. 4 hours p.r.n., lactulose 30 mL b.i.d. p.r.n., milk of magnesia 30 mL q. 6 hours p.r.n., oxycodone 5 to 10 mg q. 4 to 6 hours p.r.n., tramadol 50 to 100 mg q. 4 to 6 hours p.r.n.   PAST MEDICAL HISTORY: Atrial fibrillation, MI x3, CABG in 1999, hyperlipidemia, hypertension, aortic aneurysm  repair in 1999, pacemaker, left hip replacement, cardiac stents, chronic systolic and diastolic heart failure, chronic kidney disease stage III. ____________________________ Vance Peper, PA jrw:sb D: 04/29/2014 08:44:04 ET T: 04/29/2014 09:03:50 ET JOB#: [id number]  cc: Vance Peper, PA, <Dictator> Kwinton Maahs PA ELECTRONICALLY SIGNED 04/29/2014 19:00

## 2014-08-12 ENCOUNTER — Telehealth: Payer: Self-pay | Admitting: *Deleted

## 2014-08-12 NOTE — Telephone Encounter (Signed)
Pt is trying to get lab results.  Please call patient.

## 2014-08-12 NOTE — Telephone Encounter (Signed)
Please see result note 

## 2014-08-19 ENCOUNTER — Telehealth: Payer: Self-pay | Admitting: Cardiovascular Disease

## 2014-08-19 NOTE — Telephone Encounter (Signed)
Please call patient to discuss denial letter for Pradaxa rx.  Patient says insurance changed med to a different tier.  Plwase call patient.

## 2014-08-22 ENCOUNTER — Encounter: Payer: Self-pay | Admitting: Emergency Medicine

## 2014-08-22 ENCOUNTER — Inpatient Hospital Stay
Admission: EM | Admit: 2014-08-22 | Discharge: 2014-08-25 | DRG: 291 | Disposition: A | Discharge status: Home / Self Care | Payer: Commercial Managed Care - HMO | Attending: Internal Medicine | Admitting: Internal Medicine

## 2014-08-22 ENCOUNTER — Emergency Department: Payer: Commercial Managed Care - HMO

## 2014-08-22 DIAGNOSIS — N400 Enlarged prostate without lower urinary tract symptoms: Secondary | ICD-10-CM | POA: Diagnosis present

## 2014-08-22 DIAGNOSIS — K219 Gastro-esophageal reflux disease without esophagitis: Secondary | ICD-10-CM | POA: Diagnosis not present

## 2014-08-22 DIAGNOSIS — I739 Peripheral vascular disease, unspecified: Secondary | ICD-10-CM | POA: Diagnosis present

## 2014-08-22 DIAGNOSIS — I252 Old myocardial infarction: Secondary | ICD-10-CM

## 2014-08-22 DIAGNOSIS — E785 Hyperlipidemia, unspecified: Secondary | ICD-10-CM | POA: Diagnosis present

## 2014-08-22 DIAGNOSIS — Z8679 Personal history of other diseases of the circulatory system: Secondary | ICD-10-CM | POA: Diagnosis not present

## 2014-08-22 DIAGNOSIS — J9600 Acute respiratory failure, unspecified whether with hypoxia or hypercapnia: Secondary | ICD-10-CM | POA: Diagnosis not present

## 2014-08-22 DIAGNOSIS — Z951 Presence of aortocoronary bypass graft: Secondary | ICD-10-CM

## 2014-08-22 DIAGNOSIS — N183 Chronic kidney disease, stage 3 (moderate): Secondary | ICD-10-CM | POA: Diagnosis not present

## 2014-08-22 DIAGNOSIS — I129 Hypertensive chronic kidney disease with stage 1 through stage 4 chronic kidney disease, or unspecified chronic kidney disease: Secondary | ICD-10-CM | POA: Diagnosis present

## 2014-08-22 DIAGNOSIS — Z888 Allergy status to other drugs, medicaments and biological substances status: Secondary | ICD-10-CM

## 2014-08-22 DIAGNOSIS — Z87891 Personal history of nicotine dependence: Secondary | ICD-10-CM | POA: Diagnosis not present

## 2014-08-22 DIAGNOSIS — Z8249 Family history of ischemic heart disease and other diseases of the circulatory system: Secondary | ICD-10-CM | POA: Diagnosis not present

## 2014-08-22 DIAGNOSIS — Z79899 Other long term (current) drug therapy: Secondary | ICD-10-CM

## 2014-08-22 DIAGNOSIS — Z85828 Personal history of other malignant neoplasm of skin: Secondary | ICD-10-CM

## 2014-08-22 DIAGNOSIS — G4700 Insomnia, unspecified: Secondary | ICD-10-CM | POA: Diagnosis present

## 2014-08-22 DIAGNOSIS — G4730 Sleep apnea, unspecified: Secondary | ICD-10-CM | POA: Diagnosis present

## 2014-08-22 DIAGNOSIS — Z800 Family history of malignant neoplasm of digestive organs: Secondary | ICD-10-CM

## 2014-08-22 DIAGNOSIS — I482 Chronic atrial fibrillation: Secondary | ICD-10-CM | POA: Diagnosis present

## 2014-08-22 DIAGNOSIS — I5033 Acute on chronic diastolic (congestive) heart failure: Secondary | ICD-10-CM | POA: Diagnosis not present

## 2014-08-22 DIAGNOSIS — I4891 Unspecified atrial fibrillation: Secondary | ICD-10-CM | POA: Diagnosis not present

## 2014-08-22 DIAGNOSIS — I5032 Chronic diastolic (congestive) heart failure: Secondary | ICD-10-CM | POA: Diagnosis not present

## 2014-08-22 DIAGNOSIS — J441 Chronic obstructive pulmonary disease with (acute) exacerbation: Secondary | ICD-10-CM | POA: Diagnosis not present

## 2014-08-22 DIAGNOSIS — J440 Chronic obstructive pulmonary disease with acute lower respiratory infection: Secondary | ICD-10-CM | POA: Diagnosis not present

## 2014-08-22 DIAGNOSIS — F0390 Unspecified dementia without behavioral disturbance: Secondary | ICD-10-CM | POA: Diagnosis present

## 2014-08-22 DIAGNOSIS — I2510 Atherosclerotic heart disease of native coronary artery without angina pectoris: Secondary | ICD-10-CM | POA: Diagnosis not present

## 2014-08-22 DIAGNOSIS — Z833 Family history of diabetes mellitus: Secondary | ICD-10-CM

## 2014-08-22 DIAGNOSIS — R0602 Shortness of breath: Secondary | ICD-10-CM | POA: Diagnosis not present

## 2014-08-22 DIAGNOSIS — Z9889 Other specified postprocedural states: Secondary | ICD-10-CM

## 2014-08-22 LAB — COMPREHENSIVE METABOLIC PANEL
ALT: 15 U/L — ABNORMAL LOW (ref 17–63)
ANION GAP: 11 (ref 5–15)
AST: 18 U/L (ref 15–41)
Albumin: 3.7 g/dL (ref 3.5–5.0)
Alkaline Phosphatase: 68 U/L (ref 38–126)
BILIRUBIN TOTAL: 0.7 mg/dL (ref 0.3–1.2)
BUN: 34 mg/dL — AB (ref 6–20)
CALCIUM: 8.5 mg/dL — AB (ref 8.9–10.3)
CO2: 25 mmol/L (ref 22–32)
Chloride: 102 mmol/L (ref 101–111)
Creatinine, Ser: 2.53 mg/dL — ABNORMAL HIGH (ref 0.61–1.24)
GFR, EST AFRICAN AMERICAN: 26 mL/min — AB (ref >60)
GFR, EST NON AFRICAN AMERICAN: 23 mL/min — AB (ref >60)
GLUCOSE: 139 mg/dL — AB (ref 65–99)
Potassium: 4.6 mmol/L (ref 3.5–5.1)
Sodium: 138 mmol/L (ref 135–145)
Total Protein: 7.8 g/dL (ref 6.5–8.1)

## 2014-08-22 LAB — CBC WITH DIFFERENTIAL/PLATELET
BASOS ABS: 0.1 10*3/uL (ref 0–0.1)
BASOS PCT: 1 %
EOS PCT: 0 %
Eosinophils Absolute: 0 10*3/uL (ref 0–0.7)
HCT: 38.1 % — ABNORMAL LOW (ref 40.0–52.0)
Hemoglobin: 12.4 g/dL — ABNORMAL LOW (ref 13.0–18.0)
Lymphocytes Relative: 8 %
Lymphs Abs: 1.4 10*3/uL (ref 1.0–3.6)
MCH: 29.8 pg (ref 26.0–34.0)
MCHC: 32.5 g/dL (ref 32.0–36.0)
MCV: 91.5 fL (ref 80.0–100.0)
Monocytes Absolute: 1.8 10*3/uL — ABNORMAL HIGH (ref 0.2–1.0)
Monocytes Relative: 11 %
NEUTROS ABS: 13.5 10*3/uL — AB (ref 1.4–6.5)
NEUTROS PCT: 80 %
Platelets: 177 10*3/uL (ref 150–440)
RBC: 4.16 MIL/uL — AB (ref 4.40–5.90)
RDW: 15.8 % — AB (ref 11.5–14.5)
WBC: 16.8 10*3/uL — AB (ref 3.8–10.6)

## 2014-08-22 LAB — LACTIC ACID, PLASMA: LACTIC ACID, VENOUS: 1.8 mmol/L (ref 0.5–2.0)

## 2014-08-22 LAB — TROPONIN I: Troponin I: 0.03 ng/mL (ref <0.031)

## 2014-08-22 LAB — BRAIN NATRIURETIC PEPTIDE: B Natriuretic Peptide: 1014 pg/mL — ABNORMAL HIGH (ref 0.0–100.0)

## 2014-08-22 MED ORDER — IPRATROPIUM-ALBUTEROL 0.5-2.5 (3) MG/3ML IN SOLN
RESPIRATORY_TRACT | Status: AC
  Administered 2014-08-22: 3 mL via RESPIRATORY_TRACT
  Filled 2014-08-22: qty 3

## 2014-08-22 MED ORDER — METOPROLOL SUCCINATE ER 100 MG PO TB24
100.0000 mg | ORAL_TABLET | Freq: Every day | ORAL | Status: DC
  Administered 2014-08-22 – 2014-08-25 (×4): 100 mg via ORAL
  Filled 2014-08-22 (×4): qty 1

## 2014-08-22 MED ORDER — SODIUM CHLORIDE 0.9 % IV SOLN: 250.0000 mL | INTRAVENOUS | Status: DC | PRN

## 2014-08-22 MED ORDER — ONDANSETRON HCL 4 MG/2ML IJ SOLN
4.0000 mg | Freq: Four times a day (QID) | INTRAMUSCULAR | Status: DC | PRN
Start: 2014-08-22 — End: 2014-08-25

## 2014-08-22 MED ORDER — METHYLPREDNISOLONE SODIUM SUCC 125 MG IJ SOLR
60.0000 mg | Freq: Four times a day (QID) | INTRAMUSCULAR | Status: DC
  Administered 2014-08-22 – 2014-08-25 (×11): 60 mg via INTRAVENOUS
  Filled 2014-08-22 (×10): qty 2

## 2014-08-22 MED ORDER — LEVOFLOXACIN IN D5W 750 MG/150ML IV SOLN
750.0000 mg | Freq: Once | INTRAVENOUS | Status: AC
  Administered 2014-08-22: 750 mg via INTRAVENOUS

## 2014-08-22 MED ORDER — METHYLPREDNISOLONE SODIUM SUCC 125 MG IJ SOLR
INTRAMUSCULAR | Status: AC
  Administered 2014-08-22: 60 mg via INTRAVENOUS
  Filled 2014-08-22: qty 2

## 2014-08-22 MED ORDER — ONDANSETRON HCL 4 MG PO TABS: 4.0000 mg | ORAL_TABLET | Freq: Four times a day (QID) | ORAL | Status: DC | PRN

## 2014-08-22 MED ORDER — LEVOFLOXACIN IN D5W 750 MG/150ML IV SOLN
INTRAVENOUS | Status: AC
  Administered 2014-08-22: 750 mg via INTRAVENOUS
  Filled 2014-08-22: qty 150

## 2014-08-22 MED ORDER — IPRATROPIUM-ALBUTEROL 0.5-2.5 (3) MG/3ML IN SOLN
3.0000 mL | Freq: Once | RESPIRATORY_TRACT | Status: AC
  Administered 2014-08-22: 3 mL via RESPIRATORY_TRACT

## 2014-08-22 MED ORDER — ACETAMINOPHEN 650 MG RE SUPP: 650.0000 mg | Freq: Four times a day (QID) | RECTAL | Status: DC | PRN

## 2014-08-22 MED ORDER — DILTIAZEM HCL ER COATED BEADS 120 MG PO CP24
120.0000 mg | ORAL_CAPSULE | Freq: Every day | ORAL | Status: DC
  Administered 2014-08-22 – 2014-08-25 (×4): 120 mg via ORAL
  Filled 2014-08-22 (×4): qty 1

## 2014-08-22 MED ORDER — SODIUM CHLORIDE 0.9 % IJ SOLN: 3.0000 mL | INTRAMUSCULAR | Status: DC | PRN

## 2014-08-22 MED ORDER — LEVOFLOXACIN IN D5W 500 MG/100ML IV SOLN
500.0000 mg | INTRAVENOUS | Status: DC
  Administered 2014-08-23 – 2014-08-24 (×2): 500 mg via INTRAVENOUS
  Filled 2014-08-22 (×3): qty 100

## 2014-08-22 MED ORDER — IPRATROPIUM-ALBUTEROL 0.5-2.5 (3) MG/3ML IN SOLN
RESPIRATORY_TRACT | Status: AC
  Administered 2014-08-22: 3 mL via RESPIRATORY_TRACT
  Filled 2014-08-22: qty 6

## 2014-08-22 MED ORDER — IPRATROPIUM-ALBUTEROL 0.5-2.5 (3) MG/3ML IN SOLN
3.0000 mL | RESPIRATORY_TRACT | Status: DC
Start: 2014-08-22 — End: 2014-08-24
  Administered 2014-08-23 – 2014-08-24 (×10): 3 mL via RESPIRATORY_TRACT
  Filled 2014-08-22 (×10): qty 3

## 2014-08-22 MED ORDER — FUROSEMIDE 10 MG/ML IJ SOLN
20.0000 mg | Freq: Two times a day (BID) | INTRAMUSCULAR | Status: DC
  Administered 2014-08-23 – 2014-08-25 (×5): 20 mg via INTRAVENOUS
  Filled 2014-08-22 (×5): qty 2

## 2014-08-22 MED ORDER — DONEPEZIL HCL 5 MG PO TABS
5.0000 mg | ORAL_TABLET | Freq: Every day | ORAL | Status: DC
  Administered 2014-08-22 – 2014-08-24 (×3): 5 mg via ORAL
  Filled 2014-08-22 (×3): qty 1

## 2014-08-22 MED ORDER — TAMSULOSIN HCL 0.4 MG PO CAPS
0.4000 mg | ORAL_CAPSULE | Freq: Every day | ORAL | Status: DC
  Administered 2014-08-23 – 2014-08-24 (×2): 0.4 mg via ORAL
  Filled 2014-08-22 (×2): qty 1

## 2014-08-22 MED ORDER — PANTOPRAZOLE SODIUM 40 MG PO TBEC
40.0000 mg | DELAYED_RELEASE_TABLET | Freq: Every day | ORAL | Status: DC
  Administered 2014-08-22 – 2014-08-25 (×4): 40 mg via ORAL
  Filled 2014-08-22 (×4): qty 1

## 2014-08-22 MED ORDER — SODIUM CHLORIDE 0.9 % IJ SOLN: 3.0000 mL | Freq: Two times a day (BID) | INTRAMUSCULAR | Status: DC

## 2014-08-22 MED ORDER — NAPROXEN 250 MG PO TABS
250.0000 mg | ORAL_TABLET | Freq: Every evening | ORAL | Status: DC | PRN
  Filled 2014-08-22: qty 1

## 2014-08-22 MED ORDER — GUAIFENESIN ER 600 MG PO TB12
600.0000 mg | ORAL_TABLET | Freq: Two times a day (BID) | ORAL | Status: DC
  Administered 2014-08-22 – 2014-08-25 (×6): 600 mg via ORAL
  Filled 2014-08-22 (×6): qty 1

## 2014-08-22 MED ORDER — SENNOSIDES-DOCUSATE SODIUM 8.6-50 MG PO TABS: 1.0000 | ORAL_TABLET | Freq: Every evening | ORAL | Status: DC | PRN

## 2014-08-22 MED ORDER — DABIGATRAN ETEXILATE MESYLATE 75 MG PO CAPS
75.0000 mg | ORAL_CAPSULE | Freq: Two times a day (BID) | ORAL | Status: DC
  Administered 2014-08-22 – 2014-08-25 (×6): 75 mg via ORAL
  Filled 2014-08-22 (×6): qty 1

## 2014-08-22 MED ORDER — ACETAMINOPHEN 325 MG PO TABS: 650.0000 mg | ORAL_TABLET | Freq: Four times a day (QID) | ORAL | Status: DC | PRN

## 2014-08-22 MED ORDER — SIMVASTATIN 20 MG PO TABS
20.0000 mg | ORAL_TABLET | Freq: Every day | ORAL | Status: DC
  Administered 2014-08-22 – 2014-08-24 (×3): 20 mg via ORAL
  Filled 2014-08-22 (×3): qty 1

## 2014-08-22 NOTE — Telephone Encounter (Signed)
Pt received denial letter in the mail for pradaxa. Pt mentioned that Dr. Caryl Comes changed his pradaxa 75 mg has been denied. Pt has not received medication.

## 2014-08-22 NOTE — Telephone Encounter (Signed)
I spoke with Alyse Low from [facility] and she mentioned that there is no PA required for this medication at the time. She mentioned that she has pradaxa 75 mg on file for pt to pick up which he has not picked up medication. She mentioned that pt would have to pay a $97 dollar copay before able to get medication. She does not see any other issue with why he is unable to get medication at this time other than cost.  Spoke with pt to notify him of cost and he mentioned that he does not want to pay $97 dollars would like to be switched to something more affordable. Please advise.

## 2014-08-22 NOTE — Telephone Encounter (Signed)
We could check price of xarelto 15 mg daily If unable to afford xarelto, may need to change to warfarin.  Would make sure we have sent [place] assistance for xarelto, pradaxa and eliquis

## 2014-08-22 NOTE — ED Notes (Signed)
Pt to ED with c/o sob that has gradually gotten worse since last night, denies any cp but having some upper back pain, pt unable to speak in complete sentences, hx of COPD, RR-32 in triage, o2 sat 93 % on RA

## 2014-08-22 NOTE — ED Provider Notes (Signed)
[facility] Emergency Department Provider Note   ____________________________________________  Time seen: 5:20 PM I have reviewed the triage vital signs and the triage nursing note.  HISTORY  Chief Complaint Shortness of Breath   Patient is in moderate respiratory distress and spouse provides some of the history   HPI [name] is a 81 y.o. male whose complaint is shortness of breath started yesterday. Severity is moderate to severe. He's had wheezing. He had no chest pain. He's had no fever. He's had cough with mild amount of sputum. He felt with a cardiologist for congestive heart failure but has not gained any weight recently. He does have chronic right lower sternal a swelling due to the history of a broken leg on that side.    Past Medical History  Diagnosis Date  . Chronic atrial fibrillation     a. on pradaxa  . Coronary artery disease     a. s/p MI x 3;  b. s/p CABG x2 in 1999;  c. Cath 2007: LIMA->LAD patent, VG->OM 100, PCI/DES of RPL/mid RCA (cypher DES).  . Chronic obstructive pulmonary disease   . GERD (gastroesophageal reflux disease)   . Hyperlipidemia   . Hypertension   . PVD (peripheral vascular disease)   . AAA (abdominal aortic aneurysm)     a. s/p repair - 1999 @ [facility] @ time of CABG.  . Chronic diastolic CHF (congestive heart failure)     a. 01/2012 Echo:  EF 55%.  . Tachy-brady syndrome     a. s/p PPM 2010.  [name] CKD (chronic kidney disease), stage III     a. Acute on chronic 03/2012  . Cancer     skin cancer nose  . Sleep apnea   . Broken ankle     Patient Active Problem List   Diagnosis Date Noted  . Coronary artery disease   . Chronic atrial fibrillation   . Chronic diastolic CHF (congestive heart failure)   . Tachy-brady syndrome   . CKD (chronic kidney disease), stage III 04/09/2012  . Chronic diastolic heart failure 25/42/7062  . Chest pain 09/23/2010  . Hyperlipidemia 06/12/2009  . ESSENTIAL HYPERTENSION  06/12/2009  . CORONARY ATHEROSCLEROSIS OF ARTERY BYPASS GRAFT 06/12/2009  . ATRIAL FIBRILLATION 06/12/2009  . AAA 06/12/2009  . COPD 06/12/2009  . GERD 06/12/2009  . PACEMAKER, PERMANENT 06/12/2009    Past Surgical History  Procedure Laterality Date  . Coronary artery bypass graft    . Abdominal aortic aneurysm repair    . Insert / replace / remove pacemaker    . Foot surgery      left foot surgery to remove cyst.  . Trigger finger release    . Skin cancer biopsy      nose  . Ankle surgery Right     Current Outpatient Rx  Name  Route  Sig  Dispense  Refill  . dabigatran (PRADAXA) 75 MG CAPS capsule   Oral   Take 1 capsule (75 mg total) by mouth 2 (two) times daily.   60 capsule   3   . diltiazem (CARDIZEM CD) 120 MG 24 hr capsule   Oral   Take 1 capsule (120 mg total) by mouth daily.   30 capsule   3   . donepezil (ARICEPT) 5 MG tablet   Oral   Take 5 mg by mouth at bedtime.         . metoprolol succinate (TOPROL-XL) 100 MG 24 hr tablet   Oral  Take 1 tablet (100 mg total) by mouth daily.   30 tablet   3   . omeprazole (PRILOSEC) 20 MG capsule   Oral   Take 20 mg by mouth daily.           . simvastatin (ZOCOR) 20 MG tablet   Oral   Take 1 tablet (20 mg total) by mouth at bedtime.   30 tablet   6   . Tamsulosin HCl (FLOMAX) 0.4 MG CAPS   Oral   Take 0.4 mg by mouth daily after supper.         . torsemide (DEMADEX) 20 MG tablet   Oral   Take 20 mg by mouth once.           Allergies Atorvastatin  Family History  Problem Relation Age of Onset  . Stomach cancer Mother     died @ 60  . Diabetes Mother   . Hypertension Mother   . Coronary artery disease Mother   . Cerebral aneurysm Father     died @ 55  . Diabetes Father   . Hypertension Father   . Coronary artery disease Father     Social History History  Substance Use Topics  . Smoking status: Former Smoker -- 1.00 packs/day for 20 years    Quit date: 04/11/1997  . Smokeless  tobacco: Never Used  . Alcohol Use: No     Comment: Rare    Review of Systems  Constitutional: Negative for fever. Eyes: Negative for visual changes. ENT: Negative for sore throat. Cardiovascular: Negative for chest pain. Respiratory: Positive wheezing and coughing Gastrointestinal: Negative for abdominal pain, vomiting and diarrhea. Genitourinary: Negative for dysuria. Musculoskeletal: Positive for upper back pain. Skin: Negative for rash. Neurological: Negative for headaches, focal weakness or numbness.  ____________________________________________   PHYSICAL EXAM:  VITAL SIGNS: ED Triage Vitals  Enc Vitals Group     BP 08/22/14 1704 149/99 mmHg     Pulse Rate 08/22/14 1704 109     Resp 08/22/14 1704 32     Temp --      Temp src --      SpO2 08/22/14 1704 94 %     Weight 08/22/14 1704 230 lb (104.327 kg)     Height 08/22/14 1704 6\' 1"  (1.854 m)     Head Cir --      Peak Flow --      Pain Score 08/22/14 1705 7     Pain Loc --      Pain Edu? --      Excl. in [place]? --      Constitutional: Alert and oriented. Well appearing and in moderate respiratory distress Eyes: Conjunctivae are normal. PERRL. Normal extraocular movements. ENT   Head: Normocephalic and atraumatic.   Nose: No congestion/rhinnorhea.   Mouth/Throat: Mucous membranes are moist.   Neck: No stridor. Cardiovascular: Tachycardic and irregularly irregular.  No murmurs, rubs, or gallops. Respiratory: Moderate and expiratory wheeze in all fields with moderately decreased air movement throughout all fields. Moderate intermittent cough. Mild increased respiratory rate and retractions. Gastrointestinal: Soft and nontender. No distention.  Genitourinary: Musculoskeletal: Nontender with normal range of motion in all extremities. Trace lower extremity edema in the right lower extremity none in the left lower extremity Neurologic:  Normal speech and language. No gross focal neurologic deficits are  appreciated. Speech is normal.  Skin:  Skin is warm, dry and intact. No rash noted. Psychiatric: Mood and affect are normal. Speech and behavior are normal.  Patient exhibits appropriate insight and judgment.  ____________________________________________   EKG  Atrial fibrillation with rapid ventricular response with a heart rate of 106 bpm. Narrow QRS normal axis. Inferior and lateral T-wave inversion and ST depression. ____________________________________________  LABS (pertinent positives/negatives) BUN 34 and creatinine 2.53 Troponin 0.03 Lactate 1.8 White blood count 16.8 Hemoglobin 12.4   ____________________________________________  RADIOLOGY Radiologist results reviewed  Chest x-ray report: No acute cardiopulmonary disease __________________________________________  PROCEDURES  Procedure(s) performed:  Critical Care performed:   ____________________________________________   ED COURSE / ASSESSMENT AND PLAN  Pertinent labs & imaging results that were available during my care of the patient were reviewed by me and considered in my medical decision making (see chart for details).  Patient came in respiratory distress with hypoxia into the 80s on room air and was placed on 3 L nasal cannula and brought up into the mid 90s. He is wheezing consistent with COPD and was started on DuoNeb, however he's also had coughing and I am concerned about pneumonia or upper respiratory infection with also his elevated white blood count 16.8 units will be started on community acquired pneumonia treatment. Patient be admitted to the hospitalist. ___________________________________________   FINAL CLINICAL IMPRESSION(S) / ED DIAGNOSES   Acute copd exacerbation  With hypoxia    Lisa Roca, MD 08/26/14 [phone]

## 2014-08-22 NOTE — H&P (Cosign Needed)
[place] at [place] NAME: [name]    MR#:  [id number]  DATE OF BIRTH:  April 25, 1933  DATE OF ADMISSION:  08/22/2014  PRIMARY CARE PHYSICIAN: Maryland Pink, MD   REQUESTING/REFERRING PHYSICIAN: Dr. Reita Cliche  CHIEF COMPLAINT:   Chief Complaint  Patient presents with  . Shortness of Breath    HISTORY OF PRESENT ILLNESS: [name]  is a 81 y.o. male with a known history of COPD,atrial fibrillation coronary artery disease, history of CHF, who has been having shortness of breath ongoing for the past few days. Patient reports that whenever he tries to ambulate he just very short of breath he almost passed out because of that. He also has been having clear productive mucus also over the past few days. He does report that the shortness of breath is worse with laying down but has not noticed significant lower extremity swelling. He does report occasional chest pain. Denies any fevers chills. Patient in the ED was noted to have assessment of muscle usage was short of breath tachycardic. His chest x-ray was negative he is thought to have acute COPD exacerbation. PAST MEDICAL HISTORY:   Past Medical History  Diagnosis Date  . Chronic atrial fibrillation     a. on pradaxa  . Coronary artery disease     a. s/p MI x 3;  b. s/p CABG x2 in 1999;  c. Cath 2007: LIMA->LAD patent, VG->OM 100, PCI/DES of RPL/mid RCA (cypher DES).  . Chronic obstructive pulmonary disease   . GERD (gastroesophageal reflux disease)   . Hyperlipidemia   . Hypertension   . PVD (peripheral vascular disease)   . AAA (abdominal aortic aneurysm)     a. s/p repair - 1999 @ [facility] @ time of CABG.  . Chronic diastolic CHF (congestive heart failure)     a. 01/2012 Echo:  EF 55%.  . Tachy-brady syndrome     a. s/p PPM 2010.  [name] CKD (chronic kidney disease), stage III     a. Acute on chronic 03/2012  . Cancer     skin cancer nose  . Sleep apnea   . Broken ankle     PAST  SURGICAL HISTORY:  Past Surgical History  Procedure Laterality Date  . Coronary artery bypass graft    . Abdominal aortic aneurysm repair    . Insert / replace / remove pacemaker    . Foot surgery      left foot surgery to remove cyst.  . Trigger finger release    . Skin cancer biopsy      nose  . Ankle surgery Right     SOCIAL HISTORY:  History  Substance Use Topics  . Smoking status: Former Smoker -- 1.00 packs/day for 20 years    Quit date: 04/11/1997  . Smokeless tobacco: Never Used  . Alcohol Use: No     Comment: Rare    FAMILY HISTORY:  Family History  Problem Relation Age of Onset  . Stomach cancer Mother     died @ 71  . Diabetes Mother   . Hypertension Mother   . Coronary artery disease Mother   . Cerebral aneurysm Father     died @ 28  . Diabetes Father   . Hypertension Father   . Coronary artery disease Father     DRUG ALLERGIES:  Allergies  Allergen Reactions  . Atorvastatin Other (See Comments)    Arm pain    REVIEW OF SYSTEMS:  CONSTITUTIONAL: No fever, positive fatigue or positive weakness.  EYES: No blurred or double vision.  EARS, NOSE, AND THROAT: No tinnitus or ear pain.  RESPIRATORY: Clear productive  cough, positive shortness of breath, positive wheezing or hemoptysis.  CARDIOVASCULAR: Intermittent  chest pain, orthopnea, edema.  GASTROINTESTINAL: No nausea, vomiting, diarrhea or abdominal pain.  GENITOURINARY: No dysuria, hematuria.  ENDOCRINE: No polyuria, nocturia,  HEMATOLOGY: No anemia, easy bruising or bleeding SKIN: No rash or lesion. MUSCULOSKELETAL: No joint pain or arthritis.   NEUROLOGIC: No tingling, numbness, weakness.  PSYCHIATRY: No anxiety or depression.   MEDICATIONS AT HOME:  Prior to Admission medications   Medication Sig Start Date End Date Taking? Authorizing Provider  dabigatran (PRADAXA) 75 MG CAPS capsule Take 1 capsule (75 mg total) by mouth 2 (two) times daily. 07/16/14  Yes Deboraha Sprang, MD  diltiazem  (CARDIZEM CD) 120 MG 24 hr capsule Take 1 capsule (120 mg total) by mouth daily. 07/15/14  Yes Deboraha Sprang, MD  donepezil (ARICEPT) 5 MG tablet Take 5 mg by mouth at bedtime.   Yes Historical Provider, MD  metoprolol succinate (TOPROL-XL) 100 MG 24 hr tablet Take 1 tablet (100 mg total) by mouth daily. 06/03/13  Yes Deboraha Sprang, MD  Naproxen Sodium (ALEVE PO) Take 2 tablets by mouth at bedtime as needed (for pain).   Yes Historical Provider, MD  omeprazole (PRILOSEC) 20 MG capsule Take 20 mg by mouth daily.     Yes Historical Provider, MD  simvastatin (ZOCOR) 20 MG tablet Take 1 tablet (20 mg total) by mouth at bedtime. 11/13/13  Yes Minna Merritts, MD  Tamsulosin HCl (FLOMAX) 0.4 MG CAPS Take 0.4 mg by mouth daily after supper.   Yes Historical Provider, MD  torsemide (DEMADEX) 20 MG tablet Take 20 mg by mouth daily as needed (for weight gain).  07/12/13  Yes Minna Merritts, MD      PHYSICAL EXAMINATION:   VITAL SIGNS: Blood pressure 122/74, pulse 98, resp. rate 35, height 6\' 1"  (1.854 m), weight 104.327 kg (230 lb), SpO2 95 %.  GENERAL:  81 y.o.-year-old patient lying in the bed with mild acute distress.  EYES: Pupils equal, round, reactive to light and accommodation. No scleral icterus. Extraocular muscles intact.  HEENT: Head atraumatic, normocephalic. Oropharynx and nasopharynx clear.  NECK:  Supple, no jugular venous distention. No thyroid enlargement, no tenderness.  LUNGS: Some associated muscle usage with bilateral rhonchi and wheezing, no crepitation.  CARDIOVASCULAR: Irregularly irregular. No murmurs, rubs, or gallops.  ABDOMEN: Soft, nontender, nondistended. Bowel sounds present. No organomegaly or mass.  EXTREMITIES: No pedal edema, cyanosis, or clubbing.  NEUROLOGIC: Cranial nerves II through XII are intact. Muscle strength 5/5 in all extremities. Sensation intact. Gait not checked.  PSYCHIATRIC: The patient is alert and oriented x 3.  SKIN: No obvious rash, lesion, or  ulcer.   LABORATORY PANEL:   CBC  Recent Labs Lab 08/22/14 1747  WBC 16.8*  HGB 12.4*  HCT 38.1*  PLT 177  MCV 91.5  MCH 29.8  MCHC 32.5  RDW 15.8*  LYMPHSABS 1.4  MONOABS 1.8*  EOSABS 0.0  BASOSABS 0.1   ------------------------------------------------------------------------------------------------------------------  Chemistries   Recent Labs Lab 08/22/14 1747  NA 138  K 4.6  CL 102  CO2 25  GLUCOSE 139*  BUN 34*  CREATININE 2.53*  CALCIUM 8.5*  AST 18  ALT 15*  ALKPHOS 68  BILITOT 0.7   ------------------------------------------------------------------------------------------------------------------ estimated creatinine clearance is 30 mL/min (by C-G formula  based on Cr of 2.53). ------------------------------------------------------------------------------------------------------------------ No results for input(s): TSH, T4TOTAL, T3FREE, THYROIDAB in the last 72 hours.  Invalid input(s): FREET3   Coagulation profile No results for input(s): INR, PROTIME in the last 168 hours. ------------------------------------------------------------------------------------------------------------------- No results for input(s): DDIMER in the last 72 hours. -------------------------------------------------------------------------------------------------------------------  Cardiac Enzymes  Recent Labs Lab 08/22/14 1747  TROPONINI 0.03   ------------------------------------------------------------------------------------------------------------------ Invalid input(s): POCBNP  ---------------------------------------------------------------------------------------------------------------  Urinalysis    Component Value Date/Time   COLORURINE YELLOW 11/15/2009 1045   APPEARANCEUR CLOUDY* 11/15/2009 1045   LABSPEC 1.018 11/15/2009 1045   PHURINE 6.0 11/15/2009 1045   GLUCOSEU NEGATIVE 11/15/2009 1045   HGBUR TRACE* 11/15/2009 1045   BILIRUBINUR NEGATIVE  11/15/2009 1045   KETONESUR NEGATIVE 11/15/2009 1045   PROTEINUR 100* 11/15/2009 1045   UROBILINOGEN 1.0 11/15/2009 1045   NITRITE NEGATIVE 11/15/2009 1045   LEUKOCYTESUR SMALL* 11/15/2009 1045     RADIOLOGY: Dg Chest Port 1 View  08/22/2014   CLINICAL DATA:  Shortness of breath, worsened since last night.  EXAM: PORTABLE CHEST - 1 VIEW  COMPARISON:  April 29, 2014  FINDINGS: The heart size and mediastinal contours are stable. The heart size is enlarged. Patient is status post prior median sternotomy and CABG. Cardiac pacemaker is unchanged. There is patchy opacity of right lung base on the film 1 but does not persist on film 2 likely due to overlying soft tissue. There is no focal infiltrate, pulmonary edema, or pleural effusion. The visualized skeletal structures are stable.  IMPRESSION: No active cardiopulmonary disease.   Electronically Signed   By: Abelardo Diesel M.D.   On: 08/22/2014 18:21    EKG: Orders placed or performed in visit on 08/06/14  . EKG 12-Lead    IMPRESSION AND PLAN: Patient is a 81 year old white male with history of COPD history of CHF presents with shortness of breath  1. Acute respiratory failure; suspected acute on chronic COPD exasperation as well as possible CHF exasperation. At this time will treat the patient with nebulizers every 4 hours. Also he'll be on IV antibiotics. With level Floxin and Solu-Medrol. Also his BNP is significantly elevated I will give him IV Lasix for possible CHF exasperation if his renal function worsens then would stop the IV Lasix.  2. Chronic atrial fibrillation: Continue diltiazem and metoprolol as well as Pradaxa as taking at home, monitor on telemetry  3. GERD we'll continue omeprazole as taking at home.  4. Hyperlipidemia continue simvastatin as taking at home.  5. BPH continue Flomax  CODE STATUS: Full    TOTAL TIME TAKING CARE OF THIS PATIENT: 55 minutes   Dustin Flock M.D on 08/22/2014 at 8:30 PM  Between 7am  to 6pm - Pager - [phone]  After 6pm go to [email or web address] - password [email or web address] [facility]  [facility]  Office  [phone]  CC: Primary care physician; [place] Pink, MD

## 2014-08-23 LAB — CBC
HEMATOCRIT: 35.6 % — AB (ref 40.0–52.0)
Hemoglobin: 11.7 g/dL — ABNORMAL LOW (ref 13.0–18.0)
MCH: 30.2 pg (ref 26.0–34.0)
MCHC: 32.9 g/dL (ref 32.0–36.0)
MCV: 92 fL (ref 80.0–100.0)
Platelets: 158 10*3/uL (ref 150–440)
RBC: 3.87 MIL/uL — ABNORMAL LOW (ref 4.40–5.90)
RDW: 15.7 % — ABNORMAL HIGH (ref 11.5–14.5)
WBC: 14.2 10*3/uL — ABNORMAL HIGH (ref 3.8–10.6)

## 2014-08-23 LAB — BASIC METABOLIC PANEL
ANION GAP: 6 (ref 5–15)
BUN: 39 mg/dL — ABNORMAL HIGH (ref 6–20)
CALCIUM: 8.5 mg/dL — AB (ref 8.9–10.3)
CO2: 23 mmol/L (ref 22–32)
Chloride: 107 mmol/L (ref 101–111)
Creatinine, Ser: 2.55 mg/dL — ABNORMAL HIGH (ref 0.61–1.24)
GFR calc Af Amer: 26 mL/min — ABNORMAL LOW (ref >60)
GFR, EST NON AFRICAN AMERICAN: 22 mL/min — AB (ref >60)
Glucose, Bld: 252 mg/dL — ABNORMAL HIGH (ref 65–99)
POTASSIUM: 4.3 mmol/L (ref 3.5–5.1)
SODIUM: 136 mmol/L (ref 135–145)

## 2014-08-23 LAB — GLUCOSE, CAPILLARY: Glucose-Capillary: 230 mg/dL — ABNORMAL HIGH (ref 65–99)

## 2014-08-23 NOTE — Progress Notes (Signed)
Patient resting in bed at this time in no acute distress still on [place] 4 lit, assist with care , denies any pain , care ongoing , refused for bed alarm to be turn on , encourage to call for help when in need , verbalized understanding.

## 2014-08-23 NOTE — Progress Notes (Signed)
[name] at [place] NAME: [name]    MR#:  [id number]  DATE OF BIRTH:  06/15/1933  SUBJECTIVE: COPD ,chronic atrial fibrillation, CHF admitted for COPD exacerbation. Started on IV steroids, Levaquin. He feels much better today does a lot of cough and thick sputum. Was on Ventimask 35% yesterday but now he is on 2 L of nasal cannula. Sats 94%.   CHIEF COMPLAINT:   Chief Complaint  Patient presents with  . Shortness of Breath    REVIEW OF SYSTEMS:    Review of Systems  Constitutional: Negative for fever and chills.  HENT: Negative for hearing loss.   Eyes: Negative for blurred vision, double vision and photophobia.  Respiratory: Positive for cough, sputum production and shortness of breath. Negative for hemoptysis.   Cardiovascular: Negative for palpitations, orthopnea and leg swelling.  Gastrointestinal: Negative for vomiting, abdominal pain and diarrhea.  Genitourinary: Negative for dysuria and urgency.  Musculoskeletal: Negative for myalgias and neck pain.  Skin: Negative for rash.  Neurological: Negative for dizziness, focal weakness, seizures, weakness and headaches.  Psychiatric/Behavioral: Negative for memory loss. The patient does not have insomnia.     Nutrition:  Tolerating Diet:yes Tolerating PT:      DRUG ALLERGIES:   Allergies  Allergen Reactions  . Atorvastatin Other (See Comments)    Arm pain    VITALS:  Blood pressure 137/66, pulse 64, temperature 97.4 F (36.3 C), temperature source Oral, resp. rate 22, height 6\' 1"  (1.854 m), weight 105.688 kg (233 lb), SpO2 100 %.  PHYSICAL EXAMINATION:   Physical Exam  GENERAL:  81 y.o.-year-old patient lying in the bed with no acute distress.  EYES: Pupils equal, round, reactive to light and accommodation. No scleral icterus. Extraocular muscles intact.  HEENT: Head atraumatic, normocephalic. Oropharynx and nasopharynx clear.  NECK:  Supple, no jugular  venous distention. No thyroid enlargement, no tenderness.  LUNGS:mild expiratory wheezing present bilaterally. CARDIOVASCULAR: S1, S2 normal. No murmurs, rubs, or gallops.  ABDOMEN: Soft, nontender, nondistended. Bowel sounds present. No organomegaly or mass.  EXTREMITIES: No pedal edema, cyanosis, or clubbing.  NEUROLOGIC: Cranial nerves II through XII are intact. Muscle strength 5/5 in all extremities. Sensation intact. Gait not checked.  PSYCHIATRIC: The patient is alert and oriented x 3.  SKIN: No obvious rash, lesion, or ulcer.    LABORATORY PANEL:   CBC  Recent Labs Lab 08/23/14 0458  WBC 14.2*  HGB 11.7*  HCT 35.6*  PLT 158   ------------------------------------------------------------------------------------------------------------------  Chemistries   Recent Labs Lab 08/22/14 1747 08/23/14 0458  NA 138 136  K 4.6 4.3  CL 102 107  CO2 25 23  GLUCOSE 139* 252*  BUN 34* 39*  CREATININE 2.53* 2.55*  CALCIUM 8.5* 8.5*  AST 18  --   ALT 15*  --   ALKPHOS 68  --   BILITOT 0.7  --    ------------------------------------------------------------------------------------------------------------------  Cardiac Enzymes  Recent Labs Lab 08/22/14 1747  TROPONINI 0.03   ------------------------------------------------------------------------------------------------------------------  RADIOLOGY:  Dg Chest Port 1 View  08/22/2014   CLINICAL DATA:  Shortness of breath, worsened since last night.  EXAM: PORTABLE CHEST - 1 VIEW  COMPARISON:  April 29, 2014  FINDINGS: The heart size and mediastinal contours are stable. The heart size is enlarged. Patient is status post prior median sternotomy and CABG. Cardiac pacemaker is unchanged. There is patchy opacity of right lung base on the film 1 but does not persist on film 2 likely  due to overlying soft tissue. There is no focal infiltrate, pulmonary edema, or pleural effusion. The visualized skeletal structures are stable.   IMPRESSION: No active cardiopulmonary disease.   Electronically Signed   By: Abelardo Diesel M.D.   On: 08/22/2014 18:21     ASSESSMENT AND PLAN:  1.Patient is a 81 year old white male with history of COPD history of CHF presents with shortness of breath  1. Acute respiratory failure; suspected acute on chronic COPD exasperation as well as possible CHF exacerbation. Continue him on IV antibiotics, IV steroids, nebulizers, oxygen. Possible change to by mouth prednisone tomorrow. Repeat chest x-ray today. Regarding CHF evaluation he is already IV Lasix, symptoms are better today. BNP was 1014 on admission./has underlying ckd stage 3,baseline  cr 1.9,c stable, since admission how ever up since 4/27/ 2. Chronic atrial fibrillation: Continue diltiazem and metoprolol as well as Pradaxa as taking at home, monitor on telemetry  3. GERD we'll continue omeprazole as taking at home.  4. Hyperlipidemia continue simvastatin as taking at home.  5. BPH continue Flomax   All the records are reviewed and case discussed with Care Management/Social Workerr. Management plans discussed with the patient, family and they are in agreement.  CODE STATUS: full   TOTAL TIME TAKING CARE OF THIS PATIENT: 35 minutes.   POSSIBLE D/C IN 1-2  DAYS, DEPENDING ON CLINICAL CONDITION.   Epifanio Lesches M.D on 08/23/2014 at 9:18 AM  Between 7am to 6pm - Pager - [phone]  After 6pm go to [email or web address] - password [email or web address] [facility]  [facility]  Office  [phone]  CC: Primary care physician; [place] Pink, MD

## 2014-08-23 NOTE — Progress Notes (Addendum)
Patient tachypneic, diaphoretic and mouth breathing. Oxygen saturations 94% on 2L [place]. Afebrile, 97.5. FSBS was checked, 231, however patient denies prior history of diabetes. With suggestions from RT, patient placed on Venti mask at 35%, sating 100%. Patient refuses CPAP/BiPAP. Patient reports breathing better with Venti mask. Will continue to monitor.     Patient resting comfortably on Venti mask with oxygen saturations 98-100%. As condition improves, will try to wean patient back to [place].

## 2014-08-24 LAB — CBC
HEMATOCRIT: 34.7 % — AB (ref 40.0–52.0)
HEMOGLOBIN: 11.5 g/dL — AB (ref 13.0–18.0)
MCH: 30 pg (ref 26.0–34.0)
MCHC: 33.1 g/dL (ref 32.0–36.0)
MCV: 90.9 fL (ref 80.0–100.0)
PLATELETS: 162 10*3/uL (ref 150–440)
RBC: 3.82 MIL/uL — ABNORMAL LOW (ref 4.40–5.90)
RDW: 15.6 % — ABNORMAL HIGH (ref 11.5–14.5)
WBC: 13 10*3/uL — ABNORMAL HIGH (ref 3.8–10.6)

## 2014-08-24 LAB — BASIC METABOLIC PANEL
Anion gap: 8 (ref 5–15)
BUN: 51 mg/dL — ABNORMAL HIGH (ref 6–20)
CALCIUM: 8.7 mg/dL — AB (ref 8.9–10.3)
CO2: 22 mmol/L (ref 22–32)
CREATININE: 2.39 mg/dL — AB (ref 0.61–1.24)
Chloride: 105 mmol/L (ref 101–111)
GFR calc Af Amer: 28 mL/min — ABNORMAL LOW (ref >60)
GFR calc non Af Amer: 24 mL/min — ABNORMAL LOW (ref >60)
Glucose, Bld: 230 mg/dL — ABNORMAL HIGH (ref 65–99)
Potassium: 4.4 mmol/L (ref 3.5–5.1)
SODIUM: 135 mmol/L (ref 135–145)

## 2014-08-24 MED ORDER — IPRATROPIUM-ALBUTEROL 0.5-2.5 (3) MG/3ML IN SOLN
3.0000 mL | Freq: Four times a day (QID) | RESPIRATORY_TRACT | Status: DC
  Administered 2014-08-25 (×2): 3 mL via RESPIRATORY_TRACT
  Filled 2014-08-24 (×2): qty 3

## 2014-08-24 MED ORDER — TRAZODONE HCL 50 MG PO TABS
25.0000 mg | ORAL_TABLET | Freq: Every day | ORAL | Status: DC
  Administered 2014-08-24: 25 mg via ORAL
  Filled 2014-08-24: qty 1

## 2014-08-24 NOTE — Progress Notes (Signed)
[facility] [place] NAME: [name]    MR#:  [id number]  DATE OF BIRTH:  [birth year]  SUBJECTIVE: COPD ,chronic atrial fibrillation, CHF admitted for COPD exacerbation. Feeling better today but gest short of breath with minimal exertion.has cough with phlegm,  CHIEF COMPLAINT:   Chief Complaint  Patient presents with  . Shortness of Breath    REVIEW OF SYSTEMS:    Review of Systems  Constitutional: Negative for fever and chills.  HENT: Negative for hearing loss.   Eyes: Negative for blurred vision, double vision and photophobia.  Respiratory: Negative for cough, hemoptysis and shortness of breath.   Cardiovascular: Negative for palpitations, orthopnea and leg swelling.  Gastrointestinal: Negative for vomiting, abdominal pain and diarrhea.  Genitourinary: Negative for dysuria and urgency.  Musculoskeletal: Negative for myalgias and neck pain.  Skin: Negative for rash.  Neurological: Negative for dizziness, focal weakness, seizures, weakness and headaches.  Psychiatric/Behavioral: Negative for memory loss. The patient does not have insomnia.     Nutrition:  Tolerating Diet:yes Tolerating PT:      DRUG ALLERGIES:   Allergies  Allergen Reactions  . Atorvastatin Other (See Comments)    Arm pain    VITALS:  Blood pressure 123/63, pulse 70, temperature 97.5 F (36.4 C), temperature source Oral, resp. rate 18, height 6\' 1"  (1.854 m), weight 106.142 kg (234 lb), SpO2 96 %.  PHYSICAL EXAMINATION:   Physical Exam  GENERAL:  81 y.o.-year-old patient lying in the bed with no acute distress.  EYES: Pupils equal, round, reactive to light and accommodation. No scleral icterus. Extraocular muscles intact.  HEENT: Head atraumatic, normocephalic. Oropharynx and nasopharynx clear.  NECK:  Supple, no jugular venous distention. No thyroid enlargement, no tenderness.  LUNGS:mild expiratory wheezing present  bilaterally. CARDIOVASCULAR: S1, S2 normal. No murmurs, rubs, or gallops.  ABDOMEN: Soft, nontender, nondistended. Bowel sounds present. No organomegaly or mass.  EXTREMITIES: No pedal edema, cyanosis, or clubbing.  NEUROLOGIC: Cranial nerves II through XII are intact. Muscle strength 5/5 in all extremities. Sensation intact. Gait not checked.  PSYCHIATRIC: The patient is alert and oriented x 3.  SKIN: No obvious rash, lesion, or ulcer.    LABORATORY PANEL:   CBC  Recent Labs Lab 08/24/14 0433  WBC 13.0*  HGB 11.5*  HCT 34.7*  PLT 162   ------------------------------------------------------------------------------------------------------------------  Chemistries   Recent Labs Lab 08/22/14 1747  08/24/14 0433  NA 138  < > 135  K 4.6  < > 4.4  CL 102  < > 105  CO2 25  < > 22  GLUCOSE 139*  < > 230*  BUN 34*  < > 51*  CREATININE 2.53*  < > 2.39*  CALCIUM 8.5*  < > 8.7*  AST 18  --   --   ALT 15*  --   --   ALKPHOS 68  --   --   BILITOT 0.7  --   --   < > = values in this interval not displayed. ------------------------------------------------------------------------------------------------------------------  Cardiac Enzymes  Recent Labs Lab 08/22/14 1747  TROPONINI 0.03   ------------------------------------------------------------------------------------------------------------------  RADIOLOGY:  Dg Chest Port 1 View  08/22/2014   CLINICAL DATA:  Shortness of breath, worsened since last night.  EXAM: PORTABLE CHEST - 1 VIEW  COMPARISON:  April 29, 2014  FINDINGS: The heart size and mediastinal contours are stable. The heart size is enlarged. Patient is status post prior median sternotomy and CABG. Cardiac pacemaker is unchanged. There  is patchy opacity of right lung base on the film 1 but does not persist on film 2 likely due to overlying soft tissue. There is no focal infiltrate, pulmonary edema, or pleural effusion. The visualized skeletal structures are  stable.  IMPRESSION: No active cardiopulmonary disease.   Electronically Signed   By: Abelardo Diesel M.D.   On: 08/22/2014 18:21     ASSESSMENT AND PLAN:  1.Patient is a 81 year old white male with history of COPD history of CHF presents with shortness of breath  1. Acute respiratory failure; suspected acute on chronic COPD exasperation as well as possible CHF exacerbation.clinically improving,o2 sats 96% on 4litres;wean down o2. Continue him on IV antibiotics, IV steroids, nebulizers, oxygen  Regarding CHF evaluation he is already IV Lasix, symptoms are better today. BNP was 1014 on admission./continue IV lasix,follows up with DR.Gollan.  2. Chronic atrial fibrillation: Continue diltiazem and metoprolol as well as Pradaxa as taking at home, monitor on telemetry  3. GERD we'll continue omeprazole as taking at home.  4. Hyperlipidemia continue simvastatin as taking at home.  5. BPH continue Flomax ckfd stage 3;stable,  All the records are reviewed and case discussed with Care Management/Social Workerr. Management plans discussed with the patient, family and they are in agreement.  CODE STATUS: full   TOTAL TIME TAKING CARE OF THIS PATIENT: 35 minutes.   POSSIBLE D/C IN 1-2  DAYS, DEPENDING ON CLINICAL CONDITION.   Epifanio Lesches M.D on 08/24/2014 at 10:06 AM  Between 7am to 6pm - Pager - [phone]  After 6pm go to [email or web address] - password [email or web address] [facility]  [facility]  Office  [phone]  CC: Primary care physician; [place] Pink, MD

## 2014-08-25 MED ORDER — IPRATROPIUM-ALBUTEROL 0.5-2.5 (3) MG/3ML IN SOLN: 3.0000 mL | RESPIRATORY_TRACT | Status: DC | PRN

## 2014-08-25 MED ORDER — PREDNISONE 10 MG (21) PO TBPK: ORAL_TABLET | ORAL | Status: DC

## 2014-08-25 MED ORDER — TRAZODONE HCL 50 MG PO TABS: 25.0000 mg | ORAL_TABLET | Freq: Every day | ORAL | Status: DC

## 2014-08-25 MED ORDER — LEVOFLOXACIN 500 MG PO TABS: 500.0000 mg | ORAL_TABLET | Freq: Every day | ORAL | Status: DC

## 2014-08-25 MED ORDER — GUAIFENESIN ER 600 MG PO TB12: 600.0000 mg | ORAL_TABLET | Freq: Two times a day (BID) | ORAL | Status: DC

## 2014-08-25 NOTE — Telephone Encounter (Signed)
Spoke w/ pt.  Advised him of Dr. Donivan Scull recommendation.  He reports that Pradaxa was affordable for years, but when Dr. Caryl Comes decreased the dose from 150 mg to 75 mg, his price doubled.  He states that he was not given an explanation for the change and would like to know if he can go back to 150 mg. He was previously on coumadin and does not want to go back on it.  He states that he is happy w/ the Pradaxa and would like to remain on it.

## 2014-08-25 NOTE — Telephone Encounter (Signed)
Pt is currently inpatient at [facility].

## 2014-08-25 NOTE — Care Management (Signed)
Patient assessed due to disease process.  Presents from home and admitted with exac of copd.  Presents from home.   Independent in all adls. Denies issues accessing medical care and meds.  Attending has ordered discharge.  Discussed nee to assess need for hoe 02 with primary nurse.  Patient does qualify.  Would like to have 02 through [facility] if is in network with patients [employer].  [facility] is checking for [employer] coverage for DME.  There apparently has been some contract additions over the last few days.  Patient will benefit from home health nursing.  Declines any rehab service.  Agency preference is [facility].  Have paged attending for order for 02 and home health.  Patient street address is [place].  Is retired to the work phone number is no longer valid.  Confirmed home phone.  PCP is [place] Pink.  Referral to [facility] for home health nursing

## 2014-08-25 NOTE — Discharge Summary (Signed)
[name], is a 81 y.o. male  DOB October 23, 1933  MRN [id number].  Admission date:  08/22/2014  Admitting Physician  Dustin Flock, MD  Discharge Date:  08/25/2014   Primary MD  [place] Pink, MD  Recommendations for primary care physician for things to follow:  F/u with Dr.Tim Gollan    Admission Diagnosis  sob   Discharge Diagnosis  sob    Active Problems:   COPD exacerbation      Past Medical History  Diagnosis Date  . Chronic atrial fibrillation     a. on pradaxa  . Coronary artery disease     a. s/p MI x 3;  b. s/p CABG x2 in 1999;  c. Cath 2007: LIMA->LAD patent, VG->OM 100, PCI/DES of RPL/mid RCA (cypher DES).  . Chronic obstructive pulmonary disease   . GERD (gastroesophageal reflux disease)   . Hyperlipidemia   . Hypertension   . PVD (peripheral vascular disease)   . AAA (abdominal aortic aneurysm)     a. s/p repair - 1999 @ [facility] @ time of CABG.  . Chronic diastolic CHF (congestive heart failure)     a. 01/2012 Echo:  EF 55%.  . Tachy-brady syndrome     a. s/p PPM 2010.  [name] CKD (chronic kidney disease), stage III     a. Acute on chronic 03/2012  . Cancer     skin cancer nose  . Sleep apnea   . Broken ankle     Past Surgical History  Procedure Laterality Date  . Coronary artery bypass graft    . Abdominal aortic aneurysm repair    . Insert / replace / remove pacemaker    . Foot surgery      left foot surgery to remove cyst.  . Trigger finger release    . Skin cancer biopsy      nose  . Ankle surgery Right        History of present illness and  Hospital Course:     Kindly see H&P for history of present illness and admission details, please review complete Labs, Consult reports and Test reports for all details in brief  HPI  from the history and physical done on the day of  admission    Hospital Course ; 81 year old male patient COPD chronic atrial fibrillation CAD and CHF and shortness of breath for a few days. Associated with low excellent edema. Treated for COPD exacerbation, CHF exacerbation.. Started on nebulizers, antibiotics, Solu-Medrol. Patient BNP on admission was 1014. Patient chest x-ray on admission  showed unremarkable.  1. Acute on chronic CHF exacerbation: Patient echocardiogram March 30 4 showed EF 60%. He received IV Lasix 20 mg twice a day for 3 days. He feels much better his lower extremity edema improved.[name] He required oxygen support and on 4 litres [place]  cannula saturations 94% but they improved and he is right now on room air saturations are 98%. Patient follows up with Dr. Esmond Plants, his meds include torsemide 20 MG milligrams daily as needed and that he is advised to continue that and a follow-up with Dr. Rockey Situ  in 1 week. He does not need any extra doses of Lasix. 2. COPD exacerbation with acute respiratory failure symptoms improved with Levaquin, Solu-Medrol, and  Nebulizer,. And he isn't going to be discharged  On  course of prednisone, Levaquin. Chronic atrial fibrillation rate controlled he is on Cardizem CD 120 mg daily, Pradaxa 75 mg po bid mg daily continue them   insomnia ;he got trazodone last night and he said it helped him so I gave trazodone 50 mg daily at bedtime for 10 tablets. BPH continue tamsulosin Hyperlipidemia conditions worsen 20 mg daily GERD continue chronic 20 MG by mouth daily Dementia continue Aricept 5 mg daily   Discharge Condition: stable   Follow UP  Follow-up Information    Follow up with Ida Rogue, MD In 1 week.   Specialty:  Cardiology   Contact information:   [place] [address] [phone]         Discharge Instructions  and  Discharge Medication;     Medication List    TAKE these medications        ALEVE PO  Take 2 tablets by mouth at bedtime as needed (for  pain).     dabigatran 75 MG Caps capsule  Commonly known as:  PRADAXA  Take 1 capsule (75 mg total) by mouth 2 (two) times daily.     diltiazem 120 MG 24 hr capsule  Commonly known as:  CARDIZEM CD  Take 1 capsule (120 mg total) by mouth daily.     donepezil 5 MG tablet  Commonly known as:  ARICEPT  Take 5 mg by mouth at bedtime.     FLOMAX 0.4 MG Caps capsule  Generic drug:  tamsulosin  Take 0.4 mg by mouth daily after supper.     guaiFENesin 600 MG 12 hr tablet  Commonly known as:  MUCINEX  Take 1 tablet (600 mg total) by mouth 2 (two) times daily.     ipratropium-albuterol 0.5-2.5 (3) MG/3ML Soln  Commonly known as:  DUONEB  Take 3 mLs by nebulization every 4 (four) hours as needed (for wheezing).     levofloxacin 500 MG tablet  Commonly known as:  LEVAQUIN  Take 1 tablet (500 mg total) by mouth daily.     metoprolol succinate 100 MG 24 hr tablet  Commonly known as:  TOPROL-XL  Take 1 tablet (100 mg total) by mouth daily.     omeprazole 20 MG capsule  Commonly known as:  PRILOSEC  Take 20 mg by mouth daily.     predniSONE 10 MG (21) Tbpk tablet  Commonly known as:  STERAPRED UNI-PAK 21 TAB  - 30 mg po daily for 2 days  - 20 mg po daily for 2 days  - 10 mg daily for 4 days     simvastatin 20 MG tablet  Commonly known as:  ZOCOR  Take 1 tablet (20 mg total) by mouth at  bedtime.     torsemide 20 MG tablet  Commonly known as:  DEMADEX  Take 20 mg by mouth daily as needed (for weight gain).     traZODone 50 MG tablet  Commonly known as:  DESYREL  Take 0.5 tablets (25 mg total) by mouth at bedtime.          Diet and Activity recommendation: See Discharge Instructions above   Consults obtained - none   Major procedures and Radiology Reports - PLEASE review detailed and final reports for all details, in brief -      Dg Chest Port 1 View  08/22/2014   CLINICAL DATA:  Shortness of breath, worsened since last night.  EXAM: PORTABLE CHEST - 1 VIEW   COMPARISON:  April 29, 2014  FINDINGS: The heart size and mediastinal contours are stable. The heart size is enlarged. Patient is status post prior median sternotomy and CABG. Cardiac pacemaker is unchanged. There is patchy opacity of right lung base on the film 1 but does not persist on film 2 likely due to overlying soft tissue. There is no focal infiltrate, pulmonary edema, or pleural effusion. The visualized skeletal structures are stable.  IMPRESSION: No active cardiopulmonary disease.   Electronically Signed   By: Abelardo Diesel M.D.   On: 08/22/2014 18:21    Micro Results    Recent Results (from the past 240 hour(s))  Culture, blood (routine x 2)     Status: None (Preliminary result)   Collection Time: 08/22/14  5:47 PM  Result Value Ref Range Status   Specimen Description BLOOD  Final   Special Requests NONE  Final   Culture NO GROWTH 3 DAYS  Final   Report Status PENDING  Incomplete  Culture, blood (routine x 2)     Status: None (Preliminary result)   Collection Time: 08/22/14  6:15 PM  Result Value Ref Range Status   Specimen Description BLOOD  Final   Special Requests NONE  Final   Culture NO GROWTH 3 DAYS  Final   Report Status PENDING  Incomplete       Today   Subjective:   Tarrin Molino today has no headache,no sob,no  Cough no chest abdominal pain,no new weakness tingling or numbness, feels much better wants to go home today.  Objective:   Blood pressure 117/58, pulse 60, temperature 97.6 F (36.4 C), temperature source Oral, resp. rate 18, height 6\' 1"  (1.854 m), weight 106.459 kg (234 lb 11.2 oz), SpO2 97 %.   Intake/Output Summary (Last 24 hours) at 08/25/14 1025 Last data filed at 08/25/14 0948  Gross per 24 hour  Intake    840 ml  Output    550 ml  Net    290 ml    Exam Awake Alert, Oriented x 3, No new F.N deficits, Normal affect [place].AT,PERRAL Supple Neck,No JVD, No cervical lymphadenopathy appriciated.  Symmetrical Chest wall movement, Good air  movement bilaterally, CTAB RRR,No Gallops,Rubs or new Murmurs, No Parasternal Heave +ve B.Sounds, Abd Soft, Non tender, No organomegaly appriciated, No rebound -guarding or rigidity. No Cyanosis, Clubbing or edema, No new Rash or bruise  Data Review   CBC w Diff: Lab Results  Component Value Date   WBC 13.0* 08/24/2014   WBC 7.9 04/28/2014   HGB 11.5* 08/24/2014   HGB 11.2* 04/28/2014   HCT 34.7* 08/24/2014   HCT 34.9* 04/28/2014   PLT 162 08/24/2014   PLT 147* 04/28/2014   LYMPHOPCT 8 08/22/2014   LYMPHOPCT 14.9 04/28/2014  MONOPCT 11 08/22/2014   MONOPCT 11.0 04/28/2014   EOSPCT 0 08/22/2014   EOSPCT 2.6 04/28/2014   BASOPCT 1 08/22/2014   BASOPCT 0.8 04/28/2014    CMP: Lab Results  Component Value Date   NA 135 08/24/2014   NA 143 08/06/2014   NA 138 05/01/2014   K 4.4 08/24/2014   K 4.0 05/01/2014   CL 105 08/24/2014   CL 104 05/01/2014   CO2 22 08/24/2014   CO2 28 05/01/2014   BUN 51* 08/24/2014   BUN 21 08/06/2014   BUN 42* 05/01/2014   CREATININE 2.39* 08/24/2014   CREATININE 2.65* 05/01/2014   PROT 7.8 08/22/2014   PROT 6.9 08/06/2014   PROT 7.3 04/27/2014   ALBUMIN 3.7 08/22/2014   ALBUMIN 3.2* 04/27/2014   BILITOT 0.7 08/22/2014   BILITOT 0.5 08/06/2014   ALKPHOS 68 08/22/2014   ALKPHOS 78 04/27/2014   AST 18 08/22/2014   AST 36 04/27/2014   ALT 15* 08/22/2014   ALT 27 04/27/2014  .   Total Time in preparing paper work, data evaluation and todays exam - 56 minutes  Joniyah Mallinger M.D on 08/25/2014 at 10:25 AM

## 2014-08-25 NOTE — Telephone Encounter (Signed)
-----   Message from Jonette Eva sent at 08/25/2014 10:42 AM EDT ----- Regarding: tcm Pt saw dr Rockey Situ in ED and is coming may 22nd to see him, and I added him to waitlist for him. Needed a one week fu.

## 2014-08-25 NOTE — Progress Notes (Signed)
   08/25/14 1242  Oxygen Therapy  SpO2 94 % (rest)  O2 Device Room [employer]

## 2014-08-25 NOTE — Telephone Encounter (Signed)
Patient contacted regarding discharge from [facility] on Aug 25, 2014  Patient understands to follow up with provider Dr. Rockey Situ on 10/01/14 at 7:45am at [facility] [place]. Patient understands discharge instructions? Yes, but he wants portable oxygen tank. Instructed pt to let [place] know of his needs Patient understands medications and regiment? yes Patient understands to bring all medications to this visit? yes   Pt reports concerns of co-pay for Pradaxa.

## 2014-08-25 NOTE — Progress Notes (Signed)
Pt alertx4. VSS. No complaints of pain. Complaints of insomnia. MD notified. Medication given. Effective. Stand by assist. Uses urinal. Resting on and off in room. Will monitor,

## 2014-08-25 NOTE — Progress Notes (Signed)
   08/25/14 1241  Oxygen Therapy  SpO2 (!) 88 % (exertion)  O2 Device Room [employer]

## 2014-08-25 NOTE — Discharge Instructions (Signed)
If you experience worsening of your admission symptoms, develop shortness of breath, life threatening emergency, suicidal or homicidal thoughts you must seek medical attention immediately by calling 911 or calling your MD immediately  if symptoms less severe.  You Must read complete instructions/literature along with all the possible adverse reactions/side effects for all the Medicines you take and that have been prescribed to you. Take any new Medicines after you have completely understood and accpet all the possible adverse reactions/side effects.   Please note  You were cared for by a hospitalist during your hospital stay. If you have any questions about your discharge medications or the care you received while you were in the hospital after you are discharged, you can call the unit and asked to speak with the hospitalist on call if the hospitalist that took care of you is not available. Once you are discharged, your primary care physician will handle any further medical issues. Please note that NO REFILLS for any discharge medications will be authorized once you are discharged, as it is imperative that you return to your primary care physician (or establish a relationship with a primary care physician if you do not have one) for your aftercare needs so that they can reassess your need for medications and monitor your lab values.    [facility]

## 2014-08-25 NOTE — Progress Notes (Signed)
   08/25/14 1244  Oxygen Therapy  SpO2 94 % (exertion)  O2 Device Nasal Cannula  O2 Flow Rate (L/min) 2 L/min

## 2014-08-25 NOTE — Progress Notes (Signed)
Patient is discharge home with home nurse and 02  in a stable condition, no distress noted on discharge , slight sob on exertion noted, summary and f/u care given verbalized understanding , left with wife

## 2014-08-26 ENCOUNTER — Telehealth: Payer: Self-pay | Admitting: *Deleted

## 2014-08-26 DIAGNOSIS — N400 Enlarged prostate without lower urinary tract symptoms: Secondary | ICD-10-CM | POA: Diagnosis not present

## 2014-08-26 DIAGNOSIS — I5033 Acute on chronic diastolic (congestive) heart failure: Secondary | ICD-10-CM | POA: Diagnosis not present

## 2014-08-26 DIAGNOSIS — I739 Peripheral vascular disease, unspecified: Secondary | ICD-10-CM | POA: Diagnosis not present

## 2014-08-26 DIAGNOSIS — F0390 Unspecified dementia without behavioral disturbance: Secondary | ICD-10-CM | POA: Diagnosis not present

## 2014-08-26 DIAGNOSIS — I2510 Atherosclerotic heart disease of native coronary artery without angina pectoris: Secondary | ICD-10-CM | POA: Diagnosis not present

## 2014-08-26 DIAGNOSIS — I129 Hypertensive chronic kidney disease with stage 1 through stage 4 chronic kidney disease, or unspecified chronic kidney disease: Secondary | ICD-10-CM | POA: Diagnosis not present

## 2014-08-26 DIAGNOSIS — J441 Chronic obstructive pulmonary disease with (acute) exacerbation: Secondary | ICD-10-CM | POA: Diagnosis not present

## 2014-08-26 DIAGNOSIS — I482 Chronic atrial fibrillation: Secondary | ICD-10-CM | POA: Diagnosis not present

## 2014-08-26 DIAGNOSIS — N183 Chronic kidney disease, stage 3 (moderate): Secondary | ICD-10-CM | POA: Diagnosis not present

## 2014-08-26 NOTE — Telephone Encounter (Signed)
Spoke w/ pt's home health nurse.  She reports that pt is not having an actual reaction, but she got a warning in her computer.  Advised her that the prescriber must override any interactions and is aware. She will call back w/ any questions or concerns.

## 2014-08-26 NOTE — Telephone Encounter (Signed)
Home Health Nurse called and he is having level 1 interaction between Simavastatin  nd Diltiazem. Please call Nurse.

## 2014-08-27 LAB — CULTURE, BLOOD (ROUTINE X 2)
Culture: NO GROWTH
Culture: NO GROWTH

## 2014-08-27 NOTE — Telephone Encounter (Signed)
Can we call pharmacy in the hospital and find out if Pradaxa can be cut in half The reason for the lower dose is a change in his kidney function Now can only use 75 mg to avoid bleeding risk  If possible, could cut the 150 mg dose in half I suspect his insurance has changed but could try the 150 dose

## 2014-08-27 NOTE — Telephone Encounter (Signed)
Note below says insurance changed the pradaxa  to a different tier Probably needs to call the insurance company and find out if one of the others is on a lower tier pradaxa cannot be cut in half  xarelto 15 daily eliquis 2.5 BID

## 2014-08-27 NOTE — Telephone Encounter (Signed)
Spoke w/ Nicki Reaper in [place].  Pradaxa 150 mg cannot be cut in 1/2.

## 2014-08-28 DIAGNOSIS — I5033 Acute on chronic diastolic (congestive) heart failure: Secondary | ICD-10-CM | POA: Diagnosis not present

## 2014-08-28 DIAGNOSIS — J441 Chronic obstructive pulmonary disease with (acute) exacerbation: Secondary | ICD-10-CM | POA: Diagnosis not present

## 2014-08-28 DIAGNOSIS — N183 Chronic kidney disease, stage 3 (moderate): Secondary | ICD-10-CM | POA: Diagnosis not present

## 2014-08-28 DIAGNOSIS — F0390 Unspecified dementia without behavioral disturbance: Secondary | ICD-10-CM | POA: Diagnosis not present

## 2014-08-28 DIAGNOSIS — I482 Chronic atrial fibrillation: Secondary | ICD-10-CM | POA: Diagnosis not present

## 2014-08-28 DIAGNOSIS — I129 Hypertensive chronic kidney disease with stage 1 through stage 4 chronic kidney disease, or unspecified chronic kidney disease: Secondary | ICD-10-CM | POA: Diagnosis not present

## 2014-08-28 DIAGNOSIS — I2510 Atherosclerotic heart disease of native coronary artery without angina pectoris: Secondary | ICD-10-CM | POA: Diagnosis not present

## 2014-08-28 DIAGNOSIS — I739 Peripheral vascular disease, unspecified: Secondary | ICD-10-CM | POA: Diagnosis not present

## 2014-08-28 DIAGNOSIS — N400 Enlarged prostate without lower urinary tract symptoms: Secondary | ICD-10-CM | POA: Diagnosis not present

## 2014-08-28 NOTE — Telephone Encounter (Signed)
Spoke w/ pt.  Advised him of Dr. Donivan Scull recommendation.  He states that he really does not want to switch meds at this time. Advised him that I am leaving samples of Pradaxa 75 mg at the front desk for him to p/u at his convenience and will provide these as long as we are able.  He is appreciative and will call back if we can be of further assistance.

## 2014-09-01 NOTE — Patient Outreach (Signed)
[name]  09/01/2014  [name] May 23, 1933 [id number]   Referral from [facility], assigned Robert Dus, RN.  [name]. [place], [place] Management [place] Assistant Phone: [phone] Fax: [phone]

## 2014-09-02 DIAGNOSIS — J449 Chronic obstructive pulmonary disease, unspecified: Secondary | ICD-10-CM | POA: Diagnosis not present

## 2014-09-02 DIAGNOSIS — R2681 Unsteadiness on feet: Secondary | ICD-10-CM | POA: Diagnosis not present

## 2014-09-03 ENCOUNTER — Encounter: Payer: Self-pay | Admitting: Cardiovascular Disease

## 2014-09-03 ENCOUNTER — Ambulatory Visit (INDEPENDENT_AMBULATORY_CARE_PROVIDER_SITE_OTHER): Payer: Commercial Managed Care - HMO | Admitting: Cardiovascular Disease

## 2014-09-03 VITALS — BP 110/70 | HR 71 | Ht 73.0 in | Wt 237.5 lb

## 2014-09-03 DIAGNOSIS — I482 Chronic atrial fibrillation, unspecified: Secondary | ICD-10-CM

## 2014-09-03 DIAGNOSIS — N400 Enlarged prostate without lower urinary tract symptoms: Secondary | ICD-10-CM | POA: Diagnosis not present

## 2014-09-03 DIAGNOSIS — I129 Hypertensive chronic kidney disease with stage 1 through stage 4 chronic kidney disease, or unspecified chronic kidney disease: Secondary | ICD-10-CM | POA: Diagnosis not present

## 2014-09-03 DIAGNOSIS — I739 Peripheral vascular disease, unspecified: Secondary | ICD-10-CM | POA: Diagnosis not present

## 2014-09-03 DIAGNOSIS — I5032 Chronic diastolic (congestive) heart failure: Secondary | ICD-10-CM

## 2014-09-03 DIAGNOSIS — N183 Chronic kidney disease, stage 3 (moderate): Secondary | ICD-10-CM | POA: Diagnosis not present

## 2014-09-03 DIAGNOSIS — I5033 Acute on chronic diastolic (congestive) heart failure: Secondary | ICD-10-CM | POA: Diagnosis not present

## 2014-09-03 DIAGNOSIS — Z950 Presence of cardiac pacemaker: Secondary | ICD-10-CM

## 2014-09-03 DIAGNOSIS — J441 Chronic obstructive pulmonary disease with (acute) exacerbation: Secondary | ICD-10-CM

## 2014-09-03 DIAGNOSIS — F0390 Unspecified dementia without behavioral disturbance: Secondary | ICD-10-CM | POA: Diagnosis not present

## 2014-09-03 DIAGNOSIS — I25810 Atherosclerosis of coronary artery bypass graft(s) without angina pectoris: Secondary | ICD-10-CM | POA: Diagnosis not present

## 2014-09-03 DIAGNOSIS — I2510 Atherosclerotic heart disease of native coronary artery without angina pectoris: Secondary | ICD-10-CM

## 2014-09-03 MED ORDER — APIXABAN 2.5 MG PO TABS: 2.5000 mg | ORAL_TABLET | Freq: Two times a day (BID) | ORAL | Status: DC

## 2014-09-03 MED ORDER — TRAZODONE HCL 50 MG PO TABS: 50.0000 mg | ORAL_TABLET | Freq: Every day | ORAL | Status: DC

## 2014-09-03 NOTE — Assessment & Plan Note (Signed)
He appears euvolemic if not mildly dehydrated on today's visit. Recent rise in his creatinine while in the hospital likely from overdiuresis  He will use Lasix sparingly

## 2014-09-03 NOTE — Patient Instructions (Signed)
You are doing well.  After you run out of pradaxa, Change to eliquis one pill TWICE a day  Please call us if you have new issues that need to be addressed before your next appt.  Your physician wants you to follow-up in: 6 months.  You will receive a reminder letter in the mail two months in advance. If you don't receive a letter, please call our office to schedule the follow-up appointment.

## 2014-09-03 NOTE — Progress Notes (Signed)
Patient ID: SEMISI BIELA, male    DOB: 01/02/1934, 81 y.o.   MRN: [id number]  HPI Comments: 81 year old gentleman with a history of coronary artery disease, bypass surgery in 1999 with catheterization in 2007 with patent LIMA to the LAD, occluded vein graft to the OM, history of Cypher stent to the PL branch and mid RCA in September 07, ejection fraction 45-50%, chronic atrial fibrillation with Medtronic pacemaker, hx of previous recurrent falls and injury from alcohol abuse (stopped drinking more recently), Pacemaker was placed by [facility] heart and vascular in 2010 for syncope, AFib with pauses. He presents for routine followup of his blood pressure  . 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  Mr. Talavera presents today for follow-up after recent hospitalization for shortness of breath  He was diagnosed with COPD exacerbation, provided with antibodies, nebulizers Now at home has nebulizer which he takes as needed He was given several days of diarrhetic's in the hospital with worsening renal function concerning for prerenal state Today reports shortness of breath is back to his baseline. We spent the rest of his visit discussing anticoagulation options He has one month of pradaxa remaining, then will change to eliquis if more affordable  EKG on today's visit shows paced rhythm with ventricular rate 71 bpm, underlying atrial fibrillation  Other past medical history Previous hospitalization for malaise, dehydration, creatinine greater than 3. Lisinopril and Lasix was discontinued.   Previous echocardiogram  showed mild to moderate pulmonary hypertension. Creatinine of 2.   History of positive sleep study, was on CPAP for several months. Recent change to his insurance now has no coverage  Previous lab work from 10/23/2012 shows total cholesterol 212, LDL 142, HDL 50, creatinine 1.7, BUN 22 He is status post right knee replacement.     Allergies   Allergen Reactions  . Atorvastatin Other (See Comments)    Arm pain    Outpatient Encounter Prescriptions as of 09/03/2014  Medication Sig  . diltiazem (CARDIZEM CD) 120 MG 24 hr capsule Take 1 capsule (120 mg total) by mouth daily.  [name] donepezil (ARICEPT) 5 MG tablet Take 5 mg by mouth at bedtime.  [name] guaiFENesin (MUCINEX) 600 MG 12 hr tablet Take 1 tablet (600 mg total) by mouth 2 (two) times daily.  [name] ipratropium-albuterol (DUONEB) 0.5-2.5 (3) MG/3ML SOLN Take 3 mLs by nebulization every 4 (four) hours as needed (for wheezing).  . metoprolol succinate (TOPROL-XL) 100 MG 24 hr tablet Take 1 tablet (100 mg total) by mouth daily.  . Naproxen Sodium (ALEVE PO) Take 2 tablets by mouth at bedtime as needed (for pain).  [name] omeprazole (PRILOSEC) 20 MG capsule Take 20 mg by mouth daily.    . simvastatin (ZOCOR) 20 MG tablet Take 1 tablet (20 mg total) by mouth at bedtime.  . Tamsulosin HCl (FLOMAX) 0.4 MG CAPS Take 0.4 mg by mouth daily after supper.  . torsemide (DEMADEX) 20 MG tablet Take 20 mg by mouth daily as needed (for weight gain).   . traZODone (DESYREL) 50 MG tablet Take 1 tablet (50 mg total) by mouth at bedtime.  . [DISCONTINUED] dabigatran (PRADAXA) 75 MG CAPS capsule Take 1 capsule (75 mg total) by mouth 2 (two) times daily.  . [DISCONTINUED] traZODone (DESYREL) 50 MG tablet Take 0.5 tablets (25 mg total) by mouth at bedtime.  [name] apixaban (ELIQUIS) 2.5 MG TABS tablet Take 1 tablet (2.5 mg total) by mouth 2 (two) times daily.  . [DISCONTINUED] levofloxacin (LEVAQUIN) 500  MG tablet Take 1 tablet (500 mg total) by mouth daily. (Patient not taking: Reported on 09/03/2014)  . [DISCONTINUED] predniSONE (STERAPRED UNI-PAK 21 TAB) 10 MG (21) TBPK tablet 30 mg po daily for 2 days 20 mg po daily for 2 days 10 mg daily for 4 days (Patient not taking: Reported on 09/03/2014)   No facility-administered encounter medications on file as of 09/03/2014.    Past Medical History  Diagnosis Date  .  Chronic atrial fibrillation     a. on pradaxa  . Coronary artery disease     a. s/p MI x 3;  b. s/p CABG x2 in 1999;  c. Cath 2007: LIMA->LAD patent, VG->OM 100, PCI/DES of RPL/mid RCA (cypher DES).  . Chronic obstructive pulmonary disease   . GERD (gastroesophageal reflux disease)   . Hyperlipidemia   . Hypertension   . PVD (peripheral vascular disease)   . AAA (abdominal aortic aneurysm)     a. s/p repair - 1999 @ [facility] @ time of CABG.  . Chronic diastolic CHF (congestive heart failure)     a. 01/2012 Echo:  EF 55%.  . Tachy-brady syndrome     a. s/p PPM 2010.  [name] CKD (chronic kidney disease), stage III     a. Acute on chronic 03/2012  . Cancer     skin cancer nose  . Sleep apnea   . Broken ankle     Past Surgical History  Procedure Laterality Date  . Coronary artery bypass graft    . Abdominal aortic aneurysm repair    . Insert / replace / remove pacemaker    . Foot surgery      left foot surgery to remove cyst.  . Trigger finger release    . Skin cancer biopsy      nose  . Ankle surgery Right     Social History  reports that he quit smoking about 17 years ago. He has never used smokeless tobacco. He reports that he does not drink alcohol or use illicit drugs.  Family History family history includes Cerebral aneurysm in his father; Coronary artery disease in his father and mother; Diabetes in his father and mother; Hypertension in his father and mother; Stomach cancer in his mother.        Review of Systems  Constitutional: Negative.   Respiratory: Negative.   Cardiovascular: Negative.   Gastrointestinal: Negative.   Musculoskeletal: Positive for gait problem.  Skin: Negative.   Neurological: Negative.   Hematological: Negative.   Psychiatric/Behavioral: Negative.   All other systems reviewed and are negative.   BP 110/70 mmHg  Pulse 71  Ht 6\' 1"  (1.854 m)  Wt 237 lb 8 oz (107.729 kg)  BMI 31.34 kg/m2  Physical Exam  Constitutional: He is oriented  to person, place, and time. He appears well-developed and well-nourished.  HENT:  Head: Normocephalic.  Nose: Nose normal.  Mouth/Throat: Oropharynx is clear and moist.  Eyes: Conjunctivae are normal. Pupils are equal, round, and reactive to light.  Neck: Normal range of motion. Neck supple. No JVD present.  Cardiovascular: Normal rate, regular rhythm, S1 normal, S2 normal, normal heart sounds and intact distal pulses.  Exam reveals no gallop and no friction rub.   No murmur heard. Pulmonary/Chest: Effort normal and breath sounds normal. No respiratory distress. He has no wheezes. He has no rales. He exhibits no tenderness.  Abdominal: Soft. Bowel sounds are normal. He exhibits no distension. There is no tenderness.  Musculoskeletal: Normal range of motion.  He exhibits no edema or tenderness.  Lymphadenopathy:    He has no cervical adenopathy.  Neurological: He is alert and oriented to person, place, and time. Coordination normal.  Skin: Skin is warm and dry. No rash noted. No erythema.  Psychiatric: He has a normal mood and affect. His behavior is normal. Judgment and thought content normal.      Assessment and Plan   Nursing note and vitals reviewed.

## 2014-09-03 NOTE — Assessment & Plan Note (Signed)
Currently with no anginal symptoms. No testing at this time

## 2014-09-03 NOTE — Assessment & Plan Note (Signed)
Currently with no symptoms of angina. No further workup at this time. Continue current medication regimen. 

## 2014-09-03 NOTE — Assessment & Plan Note (Signed)
We will start him on Eliquis  When he runs out of samples of pradaxa.  Dose will be 2.5 mill grams twice a day

## 2014-09-03 NOTE — Assessment & Plan Note (Signed)
Recent admission for COPD exacerbation , now back to baseline  Less likely systolic CHF given worsening renal function with diuresis

## 2014-09-03 NOTE — Assessment & Plan Note (Signed)
Followed by EP 

## 2014-09-11 DIAGNOSIS — J441 Chronic obstructive pulmonary disease with (acute) exacerbation: Secondary | ICD-10-CM | POA: Diagnosis not present

## 2014-09-11 DIAGNOSIS — I739 Peripheral vascular disease, unspecified: Secondary | ICD-10-CM | POA: Diagnosis not present

## 2014-09-11 DIAGNOSIS — N183 Chronic kidney disease, stage 3 (moderate): Secondary | ICD-10-CM | POA: Diagnosis not present

## 2014-09-11 DIAGNOSIS — I2510 Atherosclerotic heart disease of native coronary artery without angina pectoris: Secondary | ICD-10-CM | POA: Diagnosis not present

## 2014-09-11 DIAGNOSIS — I5033 Acute on chronic diastolic (congestive) heart failure: Secondary | ICD-10-CM | POA: Diagnosis not present

## 2014-09-11 DIAGNOSIS — I129 Hypertensive chronic kidney disease with stage 1 through stage 4 chronic kidney disease, or unspecified chronic kidney disease: Secondary | ICD-10-CM | POA: Diagnosis not present

## 2014-09-11 DIAGNOSIS — N400 Enlarged prostate without lower urinary tract symptoms: Secondary | ICD-10-CM | POA: Diagnosis not present

## 2014-09-11 DIAGNOSIS — I482 Chronic atrial fibrillation: Secondary | ICD-10-CM | POA: Diagnosis not present

## 2014-09-11 DIAGNOSIS — F0390 Unspecified dementia without behavioral disturbance: Secondary | ICD-10-CM | POA: Diagnosis not present

## 2014-09-23 ENCOUNTER — Other Ambulatory Visit: Payer: Self-pay

## 2014-09-23 DIAGNOSIS — J441 Chronic obstructive pulmonary disease with (acute) exacerbation: Secondary | ICD-10-CM

## 2014-09-23 DIAGNOSIS — I5032 Chronic diastolic (congestive) heart failure: Secondary | ICD-10-CM

## 2014-09-23 NOTE — Patient Outreach (Signed)
[place] [facility]) Care Management  09/23/2014  [place] [birth year] [id number]   Request from Maury Dus, RN for [name], assigned Johny Shock, RN.  [name]. [place], [place] Management [place] Assistant Phone: [phone] Fax: [phone]

## 2014-09-23 NOTE — Patient Outreach (Signed)
[place] [facility]) Care Management  09/23/2014  [place] 04/28/1933 [id number]  Mr. Bowring returned call to RN CM.  RN CM explained the services of [facility].  Patient states he was doing better. Patient was recent inpatient for COPD Exacerbation and congestive heart failure.  Patient stated he did not need the support of a community nurse coming to his home.  He was agreeable to being called once a month by a Health Coach.  Patient agrees to the services of [facility] and agrees to an appointment with a [place].  RN CM will make a referral for Health Coach to assist patient in self-managing of his chronic conditions.  Maury Dus, RN, Ishmael Holter, [place] Telephonic Care Coordinator [phone]

## 2014-09-23 NOTE — Patient Outreach (Signed)
[place] [facility]) Care Management  09/23/2014  [name] Mar 11, 1934 [id number]   RN CM attempted to reach patient to discuss [facility] services.  Patient was unavailable at the time and HIPPA compliant voice mail message left with call back number.   RN CM will try back at a later date.  Maury Dus, RN, Ishmael Holter, [place] Telephonic Care Coordinator [phone]

## 2014-09-24 ENCOUNTER — Other Ambulatory Visit: Payer: Self-pay | Admitting: *Deleted

## 2014-09-24 NOTE — Patient Outreach (Signed)
[place] [facility]) Care Management  09/24/2014  [name] [birth year] [id number]   RN CM attempted outreach call to patient to discuss services of [place]. Patient was unavailable. HIPPA compliance voicemail message was left with return callback number. RN CM will try again at another date.  [place] Care Management [phone]

## 2014-09-25 ENCOUNTER — Other Ambulatory Visit: Payer: Self-pay | Admitting: *Deleted

## 2014-09-25 NOTE — Patient Outreach (Signed)
[place] [facility]) Care Management  09/25/2014  [place] February 03, 1934 [id number]   RN CM  2nd attempted outreach call to patient to discuss services of [place]. Patient was unavailable. HIPPA compliance voicemail message was left with return callback number. RN CM will try again.  [place] Care Management [phone]

## 2014-09-30 ENCOUNTER — Encounter: Payer: Self-pay | Admitting: *Deleted

## 2014-09-30 ENCOUNTER — Other Ambulatory Visit: Payer: Self-pay | Admitting: *Deleted

## 2014-09-30 NOTE — Patient Outreach (Signed)
[place] [facility]) Care Management  09/30/2014  [place] ZAINO 1934/02/28 [id number]   [phone] RN CM attempted third  outreach call to patient to discuss services of [place]. Patient was unavailable. HIPPA compliance voicemail message was left with return callback number.  [place] Care Management [phone]

## 2014-09-30 NOTE — Patient Outreach (Signed)
[place] [facility]) Care Management  09/30/2014  [place] 01-24-1934 [id number]   Telephone call to patient line busy. RNCM will try again today.  [place] Care Management [phone]

## 2014-09-30 NOTE — Patient Outreach (Signed)
[name]  09/30/2014  [name] April 17, 1933 [id number]   RNCM RN CM sent unsuccessful outreach letter to patient after 3rd attempt.e.  [place] Care Management [phone]

## 2014-10-01 ENCOUNTER — Encounter: Payer: Commercial Managed Care - HMO | Admitting: Cardiovascular Disease

## 2014-10-03 DIAGNOSIS — J449 Chronic obstructive pulmonary disease, unspecified: Secondary | ICD-10-CM | POA: Diagnosis not present

## 2014-10-03 DIAGNOSIS — R2681 Unsteadiness on feet: Secondary | ICD-10-CM | POA: Diagnosis not present

## 2014-10-14 ENCOUNTER — Ambulatory Visit: Payer: Commercial Managed Care - HMO | Admitting: *Deleted

## 2014-10-14 ENCOUNTER — Encounter: Payer: Self-pay | Admitting: *Deleted

## 2014-10-14 NOTE — Patient Outreach (Signed)
[place] [facility]) Care Management  10/14/2014  [name] 1933/07/14 [id number]   RN CM attempted outreach call to patient to discuss services of [place]. Patient was unavailable x 3 outreach calls withHIPPA compliance voicemail message was left with no return callbacks. Unsuccessful outreach letter sent to patient with no response after 10 days.  Case closed. Letter sent to physician.  [place] Care Management [phone]

## 2014-10-15 DIAGNOSIS — J309 Allergic rhinitis, unspecified: Secondary | ICD-10-CM | POA: Diagnosis not present

## 2014-10-15 DIAGNOSIS — I10 Essential (primary) hypertension: Secondary | ICD-10-CM | POA: Diagnosis not present

## 2014-10-15 DIAGNOSIS — N289 Disorder of kidney and ureter, unspecified: Secondary | ICD-10-CM | POA: Diagnosis not present

## 2014-10-15 DIAGNOSIS — N390 Urinary tract infection, site not specified: Secondary | ICD-10-CM | POA: Diagnosis not present

## 2014-10-17 ENCOUNTER — Encounter: Payer: Self-pay | Admitting: Internal Medicine

## 2014-10-20 NOTE — Patient Outreach (Signed)
[place] [facility]) Care Management  10/20/2014  [place] February 13, 1934 [id number]   Notification from Johny Shock, RN to close case due to unable to contact patient for [place] Management services.  Ronnell Fresh[place]. [place], [place] Management [place] Assistant Phone: [phone] Fax: [phone]

## 2014-11-02 DIAGNOSIS — J449 Chronic obstructive pulmonary disease, unspecified: Secondary | ICD-10-CM | POA: Diagnosis not present

## 2014-11-02 DIAGNOSIS — R2681 Unsteadiness on feet: Secondary | ICD-10-CM | POA: Diagnosis not present

## 2014-12-03 DIAGNOSIS — R2681 Unsteadiness on feet: Secondary | ICD-10-CM | POA: Diagnosis not present

## 2014-12-03 DIAGNOSIS — J449 Chronic obstructive pulmonary disease, unspecified: Secondary | ICD-10-CM | POA: Diagnosis not present

## 2015-01-03 DIAGNOSIS — R2681 Unsteadiness on feet: Secondary | ICD-10-CM | POA: Diagnosis not present

## 2015-01-03 DIAGNOSIS — J449 Chronic obstructive pulmonary disease, unspecified: Secondary | ICD-10-CM | POA: Diagnosis not present

## 2015-01-07 ENCOUNTER — Inpatient Hospital Stay: Payer: Commercial Managed Care - HMO

## 2015-01-07 ENCOUNTER — Encounter: Payer: Self-pay | Admitting: Emergency Medicine

## 2015-01-07 ENCOUNTER — Emergency Department: Payer: Commercial Managed Care - HMO

## 2015-01-07 ENCOUNTER — Inpatient Hospital Stay
Admission: EM | Admit: 2015-01-07 | Discharge: 2015-01-10 | DRG: 493 | Disposition: A | Discharge status: Skilled Nursing Facility | Payer: Commercial Managed Care - HMO | Attending: Internal Medicine | Admitting: Internal Medicine

## 2015-01-07 DIAGNOSIS — I252 Old myocardial infarction: Secondary | ICD-10-CM | POA: Diagnosis not present

## 2015-01-07 DIAGNOSIS — Z7901 Long term (current) use of anticoagulants: Secondary | ICD-10-CM | POA: Diagnosis not present

## 2015-01-07 DIAGNOSIS — N183 Chronic kidney disease, stage 3 (moderate): Secondary | ICD-10-CM | POA: Diagnosis not present

## 2015-01-07 DIAGNOSIS — R52 Pain, unspecified: Secondary | ICD-10-CM | POA: Diagnosis not present

## 2015-01-07 DIAGNOSIS — K5900 Constipation, unspecified: Secondary | ICD-10-CM | POA: Diagnosis not present

## 2015-01-07 DIAGNOSIS — Z01810 Encounter for preprocedural cardiovascular examination: Secondary | ICD-10-CM | POA: Diagnosis not present

## 2015-01-07 DIAGNOSIS — K219 Gastro-esophageal reflux disease without esophagitis: Secondary | ICD-10-CM | POA: Diagnosis present

## 2015-01-07 DIAGNOSIS — R112 Nausea with vomiting, unspecified: Secondary | ICD-10-CM | POA: Diagnosis not present

## 2015-01-07 DIAGNOSIS — M25512 Pain in left shoulder: Secondary | ICD-10-CM | POA: Diagnosis not present

## 2015-01-07 DIAGNOSIS — Z888 Allergy status to other drugs, medicaments and biological substances status: Secondary | ICD-10-CM | POA: Diagnosis not present

## 2015-01-07 DIAGNOSIS — I2510 Atherosclerotic heart disease of native coronary artery without angina pectoris: Secondary | ICD-10-CM | POA: Diagnosis not present

## 2015-01-07 DIAGNOSIS — S82842A Displaced bimalleolar fracture of left lower leg, initial encounter for closed fracture: Principal | ICD-10-CM | POA: Diagnosis present

## 2015-01-07 DIAGNOSIS — N184 Chronic kidney disease, stage 4 (severe): Secondary | ICD-10-CM | POA: Diagnosis present

## 2015-01-07 DIAGNOSIS — R079 Chest pain, unspecified: Secondary | ICD-10-CM | POA: Diagnosis not present

## 2015-01-07 DIAGNOSIS — S82842D Displaced bimalleolar fracture of left lower leg, subsequent encounter for closed fracture with routine healing: Secondary | ICD-10-CM | POA: Diagnosis not present

## 2015-01-07 DIAGNOSIS — I480 Paroxysmal atrial fibrillation: Secondary | ICD-10-CM | POA: Diagnosis not present

## 2015-01-07 DIAGNOSIS — W010XXA Fall on same level from slipping, tripping and stumbling without subsequent striking against object, initial encounter: Secondary | ICD-10-CM | POA: Diagnosis present

## 2015-01-07 DIAGNOSIS — S8262XA Displaced fracture of lateral malleolus of left fibula, initial encounter for closed fracture: Secondary | ICD-10-CM | POA: Diagnosis not present

## 2015-01-07 DIAGNOSIS — G4730 Sleep apnea, unspecified: Secondary | ICD-10-CM | POA: Diagnosis present

## 2015-01-07 DIAGNOSIS — J449 Chronic obstructive pulmonary disease, unspecified: Secondary | ICD-10-CM | POA: Diagnosis present

## 2015-01-07 DIAGNOSIS — R32 Unspecified urinary incontinence: Secondary | ICD-10-CM | POA: Diagnosis not present

## 2015-01-07 DIAGNOSIS — W19XXXA Unspecified fall, initial encounter: Secondary | ICD-10-CM | POA: Diagnosis not present

## 2015-01-07 DIAGNOSIS — T148 Other injury of unspecified body region: Secondary | ICD-10-CM | POA: Diagnosis not present

## 2015-01-07 DIAGNOSIS — S82845A Nondisplaced bimalleolar fracture of left lower leg, initial encounter for closed fracture: Secondary | ICD-10-CM | POA: Diagnosis not present

## 2015-01-07 DIAGNOSIS — I4891 Unspecified atrial fibrillation: Secondary | ICD-10-CM | POA: Diagnosis not present

## 2015-01-07 DIAGNOSIS — Z87891 Personal history of nicotine dependence: Secondary | ICD-10-CM | POA: Diagnosis not present

## 2015-01-07 DIAGNOSIS — M6281 Muscle weakness (generalized): Secondary | ICD-10-CM | POA: Diagnosis not present

## 2015-01-07 DIAGNOSIS — I739 Peripheral vascular disease, unspecified: Secondary | ICD-10-CM | POA: Diagnosis not present

## 2015-01-07 DIAGNOSIS — I482 Chronic atrial fibrillation: Secondary | ICD-10-CM | POA: Diagnosis not present

## 2015-01-07 DIAGNOSIS — E875 Hyperkalemia: Secondary | ICD-10-CM | POA: Diagnosis not present

## 2015-01-07 DIAGNOSIS — Z951 Presence of aortocoronary bypass graft: Secondary | ICD-10-CM | POA: Diagnosis not present

## 2015-01-07 DIAGNOSIS — Z79899 Other long term (current) drug therapy: Secondary | ICD-10-CM | POA: Diagnosis not present

## 2015-01-07 DIAGNOSIS — M25572 Pain in left ankle and joints of left foot: Secondary | ICD-10-CM | POA: Diagnosis not present

## 2015-01-07 DIAGNOSIS — I5032 Chronic diastolic (congestive) heart failure: Secondary | ICD-10-CM | POA: Diagnosis present

## 2015-01-07 DIAGNOSIS — S82892A Other fracture of left lower leg, initial encounter for closed fracture: Secondary | ICD-10-CM

## 2015-01-07 DIAGNOSIS — Z85828 Personal history of other malignant neoplasm of skin: Secondary | ICD-10-CM

## 2015-01-07 DIAGNOSIS — Y929 Unspecified place or not applicable: Secondary | ICD-10-CM | POA: Diagnosis not present

## 2015-01-07 DIAGNOSIS — S8252XA Displaced fracture of medial malleolus of left tibia, initial encounter for closed fracture: Secondary | ICD-10-CM | POA: Diagnosis not present

## 2015-01-07 DIAGNOSIS — M720 Palmar fascial fibromatosis [Dupuytren]: Secondary | ICD-10-CM | POA: Diagnosis not present

## 2015-01-07 DIAGNOSIS — M79602 Pain in left arm: Secondary | ICD-10-CM | POA: Diagnosis not present

## 2015-01-07 DIAGNOSIS — E785 Hyperlipidemia, unspecified: Secondary | ICD-10-CM | POA: Diagnosis present

## 2015-01-07 DIAGNOSIS — R2689 Other abnormalities of gait and mobility: Secondary | ICD-10-CM | POA: Diagnosis not present

## 2015-01-07 DIAGNOSIS — I130 Hypertensive heart and chronic kidney disease with heart failure and stage 1 through stage 4 chronic kidney disease, or unspecified chronic kidney disease: Secondary | ICD-10-CM | POA: Diagnosis not present

## 2015-01-07 LAB — CBC WITH DIFFERENTIAL/PLATELET
Basophils Absolute: 0.1 10*3/uL (ref 0–0.1)
Basophils Relative: 1 %
EOS PCT: 3 %
Eosinophils Absolute: 0.3 10*3/uL (ref 0–0.7)
HCT: 35.8 % — ABNORMAL LOW (ref 40.0–52.0)
Hemoglobin: 11.8 g/dL — ABNORMAL LOW (ref 13.0–18.0)
LYMPHS ABS: 1.5 10*3/uL (ref 1.0–3.6)
LYMPHS PCT: 17 %
MCH: 30.7 pg (ref 26.0–34.0)
MCHC: 32.9 g/dL (ref 32.0–36.0)
MCV: 93.3 fL (ref 80.0–100.0)
MONO ABS: 1.1 10*3/uL — AB (ref 0.2–1.0)
Monocytes Relative: 12 %
Neutro Abs: 6.2 10*3/uL (ref 1.4–6.5)
Neutrophils Relative %: 67 %
PLATELETS: 150 10*3/uL (ref 150–440)
RBC: 3.83 MIL/uL — ABNORMAL LOW (ref 4.40–5.90)
RDW: 15.9 % — AB (ref 11.5–14.5)
WBC: 9.2 10*3/uL (ref 3.8–10.6)

## 2015-01-07 LAB — SURGICAL PCR SCREEN
MRSA, PCR: NEGATIVE
Staphylococcus aureus: NEGATIVE

## 2015-01-07 LAB — COMPREHENSIVE METABOLIC PANEL
ALT: 13 U/L — ABNORMAL LOW (ref 17–63)
ANION GAP: 7 (ref 5–15)
AST: 16 U/L (ref 15–41)
Albumin: 3.5 g/dL (ref 3.5–5.0)
Alkaline Phosphatase: 54 U/L (ref 38–126)
BUN: 29 mg/dL — ABNORMAL HIGH (ref 6–20)
CHLORIDE: 110 mmol/L (ref 101–111)
CO2: 25 mmol/L (ref 22–32)
Calcium: 8.7 mg/dL — ABNORMAL LOW (ref 8.9–10.3)
Creatinine, Ser: 2.33 mg/dL — ABNORMAL HIGH (ref 0.61–1.24)
GFR, EST AFRICAN AMERICAN: 29 mL/min — AB (ref >60)
GFR, EST NON AFRICAN AMERICAN: 25 mL/min — AB (ref >60)
Glucose, Bld: 100 mg/dL — ABNORMAL HIGH (ref 65–99)
POTASSIUM: 4.3 mmol/L (ref 3.5–5.1)
Sodium: 142 mmol/L (ref 135–145)
Total Bilirubin: 0.6 mg/dL (ref 0.3–1.2)
Total Protein: 5.9 g/dL — ABNORMAL LOW (ref 6.5–8.1)

## 2015-01-07 LAB — PROTIME-INR
INR: 1.25
PROTHROMBIN TIME: 15.9 s — AB (ref 11.4–15.0)

## 2015-01-07 LAB — TYPE AND SCREEN
ABO/RH(D): A POS
ANTIBODY SCREEN: NEGATIVE

## 2015-01-07 LAB — ABO/RH: ABO/RH(D): A POS

## 2015-01-07 MED ORDER — METHYLPREDNISOLONE SODIUM SUCC 125 MG IJ SOLR
125.0000 mg | Freq: Once | INTRAMUSCULAR | Status: DC
  Filled 2015-01-07: qty 2

## 2015-01-07 MED ORDER — METOPROLOL SUCCINATE ER 100 MG PO TB24
100.0000 mg | ORAL_TABLET | Freq: Every day | ORAL | Status: DC
  Administered 2015-01-08: 100 mg via ORAL
  Filled 2015-01-07 (×2): qty 1

## 2015-01-07 MED ORDER — MORPHINE SULFATE (PF) 2 MG/ML IV SOLN
2.0000 mg | INTRAVENOUS | Status: DC | PRN
Start: 2015-01-07 — End: 2015-01-08

## 2015-01-07 MED ORDER — GUAIFENESIN ER 600 MG PO TB12
600.0000 mg | ORAL_TABLET | Freq: Two times a day (BID) | ORAL | Status: DC
  Administered 2015-01-07: 600 mg via ORAL
  Filled 2015-01-07: qty 1

## 2015-01-07 MED ORDER — FENTANYL CITRATE (PF) 100 MCG/2ML IJ SOLN
INTRAMUSCULAR | Status: AC
  Administered 2015-01-07: 100 ug via INTRAVENOUS
  Filled 2015-01-07: qty 2

## 2015-01-07 MED ORDER — LIDOCAINE HCL (PF) 1 % IJ SOLN
10.0000 mL | Freq: Once | INTRAMUSCULAR | Status: AC
  Administered 2015-01-07: 10 mL via INTRADERMAL

## 2015-01-07 MED ORDER — LIDOCAINE HCL (PF) 1 % IJ SOLN
INTRAMUSCULAR | Status: AC
  Administered 2015-01-07: 10 mL via INTRADERMAL
  Filled 2015-01-07: qty 10

## 2015-01-07 MED ORDER — IPRATROPIUM-ALBUTEROL 0.5-2.5 (3) MG/3ML IN SOLN
3.0000 mL | RESPIRATORY_TRACT | Status: DC
  Administered 2015-01-07: 3 mL via RESPIRATORY_TRACT
  Filled 2015-01-07: qty 3

## 2015-01-07 MED ORDER — CEFAZOLIN SODIUM-DEXTROSE 2-3 GM-% IV SOLR
2.0000 g | INTRAVENOUS | Status: AC
  Administered 2015-01-08: 2 g via INTRAVENOUS
  Filled 2015-01-07: qty 50

## 2015-01-07 MED ORDER — ONDANSETRON HCL 4 MG PO TABS: 4.0000 mg | ORAL_TABLET | Freq: Four times a day (QID) | ORAL | Status: DC | PRN

## 2015-01-07 MED ORDER — OXYCODONE HCL 5 MG PO TABS
10.0000 mg | ORAL_TABLET | ORAL | Status: DC | PRN
  Administered 2015-01-07 – 2015-01-08 (×3): 10 mg via ORAL
  Filled 2015-01-07 (×3): qty 2

## 2015-01-07 MED ORDER — IPRATROPIUM-ALBUTEROL 0.5-2.5 (3) MG/3ML IN SOLN
9.0000 mL | Freq: Once | RESPIRATORY_TRACT | Status: DC
  Filled 2015-01-07: qty 9

## 2015-01-07 MED ORDER — SODIUM CHLORIDE 0.45 % IV SOLN
INTRAVENOUS | Status: DC
  Administered 2015-01-08 (×2): via INTRAVENOUS

## 2015-01-07 MED ORDER — CLINDAMYCIN PHOSPHATE 600 MG/50ML IV SOLN
600.0000 mg | Freq: Once | INTRAVENOUS | Status: AC
  Administered 2015-01-08: 600 mg via INTRAVENOUS
  Filled 2015-01-07: qty 50

## 2015-01-07 MED ORDER — DEXTROSE-NACL 5-0.2 % IV SOLN: INTRAVENOUS | Status: DC

## 2015-01-07 MED ORDER — DONEPEZIL HCL 5 MG PO TABS
5.0000 mg | ORAL_TABLET | Freq: Every day | ORAL | Status: DC
  Administered 2015-01-07: 5 mg via ORAL
  Filled 2015-01-07: qty 1

## 2015-01-07 MED ORDER — PANTOPRAZOLE SODIUM 40 MG PO TBEC
40.0000 mg | DELAYED_RELEASE_TABLET | Freq: Every day | ORAL | Status: DC
Start: 2015-01-08 — End: 2015-01-08
  Filled 2015-01-07: qty 1

## 2015-01-07 MED ORDER — FENTANYL CITRATE (PF) 100 MCG/2ML IJ SOLN
100.0000 ug | Freq: Once | INTRAMUSCULAR | Status: AC
  Administered 2015-01-07: 100 ug via INTRAVENOUS

## 2015-01-07 MED ORDER — DILTIAZEM HCL ER COATED BEADS 120 MG PO CP24
120.0000 mg | ORAL_CAPSULE | Freq: Every day | ORAL | Status: DC
  Administered 2015-01-08: 120 mg via ORAL
  Filled 2015-01-07: qty 1

## 2015-01-07 MED ORDER — TAMSULOSIN HCL 0.4 MG PO CAPS
0.4000 mg | ORAL_CAPSULE | Freq: Every day | ORAL | Status: DC
  Administered 2015-01-07: 0.4 mg via ORAL
  Filled 2015-01-07: qty 1

## 2015-01-07 MED ORDER — ONDANSETRON HCL 4 MG/2ML IJ SOLN
4.0000 mg | Freq: Four times a day (QID) | INTRAMUSCULAR | Status: DC | PRN
  Administered 2015-01-08 (×2): 4 mg via INTRAVENOUS
  Filled 2015-01-07 (×2): qty 2

## 2015-01-07 MED ORDER — IPRATROPIUM-ALBUTEROL 0.5-2.5 (3) MG/3ML IN SOLN
3.0000 mL | Freq: Three times a day (TID) | RESPIRATORY_TRACT | Status: DC
  Administered 2015-01-08 (×2): 3 mL via RESPIRATORY_TRACT
  Filled 2015-01-07 (×2): qty 3

## 2015-01-07 MED ORDER — TRAZODONE HCL 50 MG PO TABS
50.0000 mg | ORAL_TABLET | Freq: Every day | ORAL | Status: DC
  Administered 2015-01-07: 50 mg via ORAL
  Filled 2015-01-07: qty 1

## 2015-01-07 MED ORDER — SIMVASTATIN 20 MG PO TABS
20.0000 mg | ORAL_TABLET | Freq: Every day | ORAL | Status: DC
  Administered 2015-01-07: 20 mg via ORAL
  Filled 2015-01-07: qty 1

## 2015-01-07 MED ORDER — TETANUS-DIPHTHERIA TOXOIDS TD 5-2 LFU IM INJ
0.5000 mL | INJECTION | Freq: Once | INTRAMUSCULAR | Status: AC
  Administered 2015-01-07: 0.5 mL via INTRAMUSCULAR
  Filled 2015-01-07: qty 0.5

## 2015-01-07 MED ORDER — HYDROCODONE-ACETAMINOPHEN 5-325 MG PO TABS: 0.5000 | ORAL_TABLET | ORAL | Status: DC | PRN

## 2015-01-07 NOTE — ED Notes (Addendum)
Admitting doctor at bedside 

## 2015-01-07 NOTE — ED Provider Notes (Signed)
[facility] Emergency Department Provider Note  ____________________________________________  Time seen: Seen upon arrival to the emergency department  I have reviewed the triage vital signs and the nursing notes.   HISTORY  Chief Complaint Fall and Ankle Injury    HPI [name] is a 81 y.o. male with a history of atrial fibrillation on eliquis who is presenting today after a trip and fall down one step in the back of his home. He says he was walking with his cane when he lost his footing and fell on his ankle awkwardly. He denies hitting his head or losing consciousness. He denies any headache at this time. Was given 100 g of fentanyl en route.   Past Medical History  Diagnosis Date  . Chronic atrial fibrillation     a. on pradaxa  . Coronary artery disease     a. s/p MI x 3;  b. s/p CABG x2 in 1999;  c. Cath 2007: LIMA->LAD patent, VG->OM 100, PCI/DES of RPL/mid RCA (cypher DES).  . Chronic obstructive pulmonary disease   . GERD (gastroesophageal reflux disease)   . Hyperlipidemia   . Hypertension   . PVD (peripheral vascular disease)   . AAA (abdominal aortic aneurysm)     a. s/p repair - 1999 @ [facility] @ time of CABG.  . Chronic diastolic CHF (congestive heart failure)     a. 01/2012 Echo:  EF 55%.  . Tachy-brady syndrome     a. s/p PPM 2010.  [name] CKD (chronic kidney disease), stage III     a. Acute on chronic 03/2012  . Cancer     skin cancer nose  . Sleep apnea   . Broken ankle     Patient Active Problem List   Diagnosis Date Noted  . Bimalleolar fracture of left ankle 01/07/2015  . COPD exacerbation 08/22/2014  . Coronary artery disease   . Chronic atrial fibrillation   . Chronic diastolic CHF (congestive heart failure)   . Tachy-brady syndrome   . CKD (chronic kidney disease), stage III 04/09/2012  . Chronic diastolic heart failure 75/01/2584  . Chest pain 09/23/2010  . Hyperlipidemia 06/12/2009  . ESSENTIAL HYPERTENSION  06/12/2009  . CORONARY ATHEROSCLEROSIS OF ARTERY BYPASS GRAFT 06/12/2009  . ATRIAL FIBRILLATION 06/12/2009  . AAA 06/12/2009  . COPD 06/12/2009  . GERD 06/12/2009  . PACEMAKER, PERMANENT 06/12/2009    Past Surgical History  Procedure Laterality Date  . Coronary artery bypass graft    . Abdominal aortic aneurysm repair    . Insert / replace / remove pacemaker    . Foot surgery      left foot surgery to remove cyst.  . Trigger finger release    . Skin cancer biopsy      nose  . Ankle surgery Right     Current Outpatient Rx  Name  Route  Sig  Dispense  Refill  . apixaban (ELIQUIS) 2.5 MG TABS tablet   Oral   Take 1 tablet (2.5 mg total) by mouth 2 (two) times daily.   60 tablet   11   . diltiazem (CARDIZEM CD) 120 MG 24 hr capsule   Oral   Take 1 capsule (120 mg total) by mouth daily.   30 capsule   3   . donepezil (ARICEPT) 5 MG tablet   Oral   Take 5 mg by mouth at bedtime.         [name] guaiFENesin (MUCINEX) 600 MG 12 hr tablet  Oral   Take 1 tablet (600 mg total) by mouth 2 (two) times daily.   30 tablet   0   . ipratropium-albuterol (DUONEB) 0.5-2.5 (3) MG/3ML SOLN   Nebulization   Take 3 mLs by nebulization every 4 (four) hours as needed (for wheezing).   360 mL   0   . metoprolol succinate (TOPROL-XL) 100 MG 24 hr tablet   Oral   Take 1 tablet (100 mg total) by mouth daily.   30 tablet   3   . omeprazole (PRILOSEC) 20 MG capsule   Oral   Take 20 mg by mouth daily.           . simvastatin (ZOCOR) 20 MG tablet   Oral   Take 1 tablet (20 mg total) by mouth at bedtime.   30 tablet   6   . Tamsulosin HCl (FLOMAX) 0.4 MG CAPS   Oral   Take 0.4 mg by mouth daily after supper.         . torsemide (DEMADEX) 20 MG tablet   Oral   Take 20 mg by mouth daily as needed (for weight gain).          . traZODone (DESYREL) 50 MG tablet   Oral   Take 1 tablet (50 mg total) by mouth at bedtime.   30 tablet   3   . Naproxen Sodium (ALEVE PO)    Oral   Take 2 tablets by mouth at bedtime as needed (for pain).           Allergies Atorvastatin  Family History  Problem Relation Age of Onset  . Stomach cancer Mother     died @ 69  . Diabetes Mother   . Hypertension Mother   . Coronary artery disease Mother   . Cerebral aneurysm Father     died @ 10  . Diabetes Father   . Hypertension Father   . Coronary artery disease Father     Social History Social History  Substance Use Topics  . Smoking status: Former Smoker -- 1.00 packs/day for 20 years    Quit date: 04/11/1997  . Smokeless tobacco: Never Used  . Alcohol Use: No     Comment: Rare    Review of Systems Constitutional: No fever/chills Eyes: No visual changes. ENT: No sore throat. Cardiovascular: Denies chest pain. Respiratory: Denies shortness of breath. Gastrointestinal: No abdominal pain.  No nausea, no vomiting.  No diarrhea.  No constipation. Genitourinary: Negative for dysuria. Musculoskeletal: Negative for back pain. Skin: Negative for rash. Neurological: Negative for headaches, focal weakness or numbness.  10-point ROS otherwise negative.  ____________________________________________   PHYSICAL EXAM:  VITAL SIGNS: ED Triage Vitals  Enc Vitals Group     BP --      Pulse --      Resp --      Temp --      Temp src --      SpO2 --      Weight --      Height --      Head Cir --      Peak Flow --      Pain Score --      Pain Loc --      Pain Edu? --      Excl. in [place]? --     Constitutional: Alert and oriented. Well appearing and in no acute distress. Eyes: Conjunctivae are normal. PERRL. EOMI. Head: Atraumatic. Nose: No congestion/rhinnorhea. Mouth/Throat: Mucous membranes  are moist.  Oropharynx non-erythematous. Neck: No stridor.  No tenderness. Cardiovascular: Normal rate, regular rhythm. Grossly normal heart sounds.  Good peripheral circulation. Respiratory: Normal respiratory effort.  No retractions. Lungs  CTAB. Gastrointestinal: Soft and nontender. No distention. No abdominal bruits. No CVA tenderness. Musculoskeletal: Left medial malleolus with overlying swelling and ecchymosis with deformity. It is tender to touch at this point. The patient able to minimally range the foot at the ankle but with pain. There is an intact dorsalis pedis pulse and the patient is able to feel sensation to light touch distal to the injury. He is also able to range his toes freely.  Neurologic:  Normal speech and language. No gross focal neurologic deficits are appreciated. No gait instability. Skin:  Skin is warm, dry and intact. No rash noted. Psychiatric: Mood and affect are normal. Speech and behavior are normal.  ____________________________________________   LABS (all labs ordered are listed, but only abnormal results are displayed)  Labs Reviewed - No data to display ____________________________________________  EKG   ____________________________________________  RADIOLOGY  Displaced bimalleolar ankle fractures with moderate lateral subluxation of the talus in relation to the tibia.  Postreduction film still with lateral displacement. ____________________________________________   PROCEDURES  Reduction of dislocation Date/Time: 4:17 PM Performed by: Doran Stabler Authorized by: Doran Stabler Consent: Verbal consent obtained. Risks and benefits: risks, benefits and alternatives were discussed Consent given by: patient Required items: required blood products, implants, devices, and special equipment available Time out: Immediately prior to procedure a "time out" was called to verify the correct patient, procedure, equipment, support staff and site/side marked as required.    Vitals: Vital signs were monitored during sedation. Patient tolerance: Patient tolerated the procedure well with no immediate complications. Joint: Left ankle with bimalleolar fracture and lateral  displacement. Reduction technique: Used 8 cc of 1% lidocaine for a hematoma block. Injected directly into hematoma.   Traction as well as medially. Reduced with good alignment and splinted in a posterior as well as stirrup. Patient neurovascularly intact status post splinting.    ____________________________________________   INITIAL IMPRESSION / ASSESSMENT AND PLAN / ED COURSE  Pertinent labs & imaging results that were available during my care of the patient were reviewed by me and considered in my medical decision making (see chart for details).  Patient has been seen by [facility] in the past.  ----------------------------------------- 4:17 PM on 01/07/2015 ----------------------------------------- Patient resting comfortably at this time. Still remains neurovascularly intact after splinting. However, alignment was not ideal after reduction. I believe this may be from this being a very unstable fracture. I did discuss with Dr. Sabra Heck this issue, as well as the patient being neurovascularly intact. He says that it is unnecessary to obtain exact alignment at this time as long as he is splinted.   ____________________________________________   FINAL CLINICAL IMPRESSION(S) / ED DIAGNOSES  Final diagnoses:  Ankle fracture, left, closed, initial encounter      Orbie Pyo, MD 01/07/15 1620

## 2015-01-07 NOTE — H&P (Signed)
PREOPERATIVE H&P  Chief Complaint: ANKLE FRACTURE LEFT BIMALLEOLAR  HPI: [name] is a 81 y.o. male who presents for preoperative history and physical with a diagnosis of BIMALLEOLAR LEFT ANKLE FRACTURE.  He tripped and fell going in the back door while holding some groceries today.  He was unable to walk.  He was brought to the emergency room where exam and x-rays reveal a bimalleolar fracture dislocation of the left ankle.  Closed reduction and splinting were undertaken by Dr. Caro Laroche. Symptoms are rated as moderate to severe.  He fractured the opposite ankle in January of this year and recovered from this surgery quite well.  This was done by Dr. Daine Gip.  He has elected for surgical management after discussion of the risks and benefits and postop management of this injury.  I plan to proceed with surgery tomorrow morning if he is cleared by the medical service.  Past Medical History  Diagnosis Date  . Chronic atrial fibrillation     a. on pradaxa  . Coronary artery disease     a. s/p MI x 3;  b. s/p CABG x2 in 1999;  c. Cath 2007: LIMA->LAD patent, VG->OM 100, PCI/DES of RPL/mid RCA (cypher DES).  . Chronic obstructive pulmonary disease   . GERD (gastroesophageal reflux disease)   . Hyperlipidemia   . Hypertension   . PVD (peripheral vascular disease)   . AAA (abdominal aortic aneurysm)     a. s/p repair - 1999 @ [facility] @ time of CABG.  . Chronic diastolic CHF (congestive heart failure)     a. 01/2012 Echo:  EF 55%.  . Tachy-brady syndrome     a. s/p PPM 2010.  [name] CKD (chronic kidney disease), stage III     a. Acute on chronic 03/2012  . Cancer     skin cancer nose  . Sleep apnea   . Broken ankle    Past Surgical History  Procedure Laterality Date  . Coronary artery bypass graft    . Abdominal aortic aneurysm repair    . Insert / replace / remove pacemaker    . Foot surgery      left foot surgery to remove cyst.  . Trigger finger release    . Skin cancer biopsy      nose   . Ankle surgery Right    Social History   Social History  . Marital Status: Married    Spouse Name: N/A  . Number of Children: N/A  . Years of Education: N/A   Social History Main Topics  . Smoking status: Former Smoker -- 1.00 packs/day for 20 years    Quit date: 04/11/1997  . Smokeless tobacco: Never Used  . Alcohol Use: No     Comment: Rare  . Drug Use: No  . Sexual Activity: Not Asked   Other Topics Concern  . None   Social History Narrative   Lives locally with his wife.  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     Family History  Problem Relation Age of Onset  . Stomach cancer Mother     died @ 76  . Diabetes Mother   . Hypertension Mother   . Coronary artery disease Mother   . Cerebral aneurysm Father     died @ 89  . Diabetes Father   . Hypertension Father   . Coronary artery disease Father    Allergies  Allergen Reactions  . Atorvastatin Other (See Comments)    Arm pain  Prior to Admission medications   Medication Sig Start Date End Date Taking? Authorizing Shylie Polo  apixaban (ELIQUIS) 2.5 MG TABS tablet Take 1 tablet (2.5 mg total) by mouth 2 (two) times daily. 09/03/14  Yes Minna Merritts, MD  diltiazem (CARDIZEM CD) 120 MG 24 hr capsule Take 1 capsule (120 mg total) by mouth daily. 07/15/14  Yes Deboraha Sprang, MD  donepezil (ARICEPT) 5 MG tablet Take 5 mg by mouth at bedtime.   Yes Historical Rashidah Belleville, MD  guaiFENesin (MUCINEX) 600 MG 12 hr tablet Take 1 tablet (600 mg total) by mouth 2 (two) times daily. 08/25/14  Yes Epifanio Lesches, MD  ipratropium-albuterol (DUONEB) 0.5-2.5 (3) MG/3ML SOLN Take 3 mLs by nebulization every 4 (four) hours as needed (for wheezing). 08/25/14  Yes Epifanio Lesches, MD  metoprolol succinate (TOPROL-XL) 100 MG 24 hr tablet Take 1 tablet (100 mg total) by mouth daily. 06/03/13  Yes Deboraha Sprang, MD  omeprazole (PRILOSEC) 20 MG capsule Take 20 mg by mouth daily.     Yes Historical Savas Elvin, MD  simvastatin  (ZOCOR) 20 MG tablet Take 1 tablet (20 mg total) by mouth at bedtime. 11/13/13  Yes Minna Merritts, MD  Tamsulosin HCl (FLOMAX) 0.4 MG CAPS Take 0.4 mg by mouth daily after supper.   Yes Historical Mandeep Ferch, MD  torsemide (DEMADEX) 20 MG tablet Take 20 mg by mouth daily as needed (for weight gain).  07/12/13  Yes Minna Merritts, MD  traZODone (DESYREL) 50 MG tablet Take 1 tablet (50 mg total) by mouth at bedtime. 09/03/14  Yes Minna Merritts, MD  Naproxen Sodium (ALEVE PO) Take 2 tablets by mouth at bedtime as needed (for pain).    Historical Ronnette Rump, MD     Positive ROS: All other systems have been reviewed and were otherwise negative with the exception of those mentioned in the HPI and as above.  Physical Exam: General: Alert, no acute distress Cardiovascular: No pedal edema. Heart is regular and without murmur.  Respiratory: No cyanosis, no use of accessory musculature. Lungs are clear. GI: No organomegaly, abdomen is soft and non-tender Skin: No lesions in the area of chief complaint Neurologic: Sensation intact distally Psychiatric: Patient is competent for consent with normal mood and affect Lymphatic: No axillary or cervical lymphadenopathy  MUSCULOSKELETAL: Patient is alert and awake, lying quietly on the stretcher.  Left ankle is tender to palpation.  The skin is intact.  His mild deformity.  Neurovascular status is good distally.  His splint is in place.  No other orthopedic injuries are noted or complained about.  Assessment: BIMALLEOLAR LEFT ANKLE FRACTURE  Plan: Plan for Procedure(s): OPEN REDUCTION INTERNAL FIXATION (ORIF) ANKLE FRACTURE  The risks benefits and alternatives were discussed with the patient including but not limited to the risks of nonoperative treatment, versus surgical intervention including infection, bleeding, nerve injury,  blood clots, cardiopulmonary complications, morbidity, mortality, among others, and they were willing to proceed.    Park Breed, MD [phone]   01/07/2015 4:52 PM

## 2015-01-07 NOTE — Consult Note (Signed)
[facility] [place] NAME: [name]    MR#:  [id number]  DATE OF BIRTH:  03/21/1934  DATE OF ADMISSION:  01/07/2015  PRIMARY CARE PHYSICIAN: Maryland Pink, MD   REQUESTING/REFERRING PHYSICIAN: Dr. Earnestine Leys  CHIEF COMPLAINT:   Chief Complaint  Patient presents with  . Fall  . Ankle Injury    HISTORY OF PRESENT ILLNESS:  [name]  is a 81 y.o. male with a known history of hypertension, hyperlipidemia, chronic atrial fibrillation on Pradaxa at home. History of coronary artery disease status post coronary artery bypass grafting 2 in 1999, history of negative myocardial perfusion test in June 2015 who presents to the hospital with complaints of left ankle pain after fall. Per the patient was shopping at [facility] when he fell down. He was brought to emergency room where he was noted to have bimalleolar ankle fracture and the Dr. Earnestine Leys admitted the patient.  Hospitalist services were contacted for consultation. Patient admits of chest pains intermittently, but denies any relation to exertion. He has stress test in June 2015, which was negative.   PAST MEDICAL HISTORY:   Past Medical History  Diagnosis Date  . Chronic atrial fibrillation     a. on pradaxa  . Coronary artery disease     a. s/p MI x 3;  b. s/p CABG x2 in 1999;  c. Cath 2007: LIMA->LAD patent, VG->OM 100, PCI/DES of RPL/mid RCA (cypher DES).  . Chronic obstructive pulmonary disease   . GERD (gastroesophageal reflux disease)   . Hyperlipidemia   . Hypertension   . PVD (peripheral vascular disease)   . AAA (abdominal aortic aneurysm)     a. s/p repair - 1999 @ [facility] @ time of CABG.  . Chronic diastolic CHF (congestive heart failure)     a. 01/2012 Echo:  EF 55%.  . Tachy-brady syndrome     a. s/p PPM 2010.  [name] CKD (chronic kidney disease), stage III     a. Acute on chronic 03/2012  . Cancer     skin cancer nose  . Sleep apnea   . Broken ankle      PAST SURGICAL HISTOIRY:   Past Surgical History  Procedure Laterality Date  . Coronary artery bypass graft    . Abdominal aortic aneurysm repair    . Insert / replace / remove pacemaker    . Foot surgery      left foot surgery to remove cyst.  . Trigger finger release    . Skin cancer biopsy      nose  . Ankle surgery Right     SOCIAL HISTORY:   Social History  Substance Use Topics  . Smoking status: Former Smoker -- 1.00 packs/day for 20 years    Quit date: 04/11/1997  . Smokeless tobacco: Never Used  . Alcohol Use: No     Comment: Rare    FAMILY HISTORY:   Family History  Problem Relation Age of Onset  . Stomach cancer Mother     died @ 28  . Diabetes Mother   . Hypertension Mother   . Coronary artery disease Mother   . Cerebral aneurysm Father     died @ 95  . Diabetes Father   . Hypertension Father   . Coronary artery disease Father     DRUG ALLERGIES:   Allergies  Allergen Reactions  . Atorvastatin Other (See Comments)    Arm pain    REVIEW  OF SYSTEMS:  CONSTITUTIONAL: No fever.  Hot and chilly intermittently. Weight is stable. Some fatigue and weakness EYES: No blurred or double vision.  EARS, NOSE, AND THROAT: No tinnitus or ear pain. Erratic nasal congestion and chest congestion, some nasal discharge RESPIRATORY: Intermittent cough, thick sputum production which is clear , some shortness of breath, intermittent wheezing , no hemoptysis.  CARDIOVASCULAR: Intermittent, not exercise related chest pain, no orthopnea, no edema.  GASTROINTESTINAL: No nausea, vomiting, diarrhea or abdominal pain.  GENITOURINARY: No dysuria, hematuria.  ENDOCRINE: No polyuria, nocturia,  HEMATOLOGY: No anemia, easy bruising or bleeding SKIN: No rash or lesion. MUSCULOSKELETAL: No joint pain or arthritis.   NEUROLOGIC: No tingling, numbness, weakness.  PSYCHIATRY: No anxiety or depression.   MEDICATIONS AT HOME:   Prior to Admission medications   Medication  Sig Start Date End Date Taking? Authorizing Provider  apixaban (ELIQUIS) 2.5 MG TABS tablet Take 1 tablet (2.5 mg total) by mouth 2 (two) times daily. 09/03/14  Yes Minna Merritts, MD  diltiazem (CARDIZEM CD) 120 MG 24 hr capsule Take 1 capsule (120 mg total) by mouth daily. 07/15/14  Yes Deboraha Sprang, MD  donepezil (ARICEPT) 5 MG tablet Take 5 mg by mouth at bedtime.   Yes Historical Provider, MD  guaiFENesin (MUCINEX) 600 MG 12 hr tablet Take 1 tablet (600 mg total) by mouth 2 (two) times daily. 08/25/14  Yes Epifanio Lesches, MD  ipratropium-albuterol (DUONEB) 0.5-2.5 (3) MG/3ML SOLN Take 3 mLs by nebulization every 4 (four) hours as needed (for wheezing). 08/25/14  Yes Epifanio Lesches, MD  metoprolol succinate (TOPROL-XL) 100 MG 24 hr tablet Take 1 tablet (100 mg total) by mouth daily. 06/03/13  Yes Deboraha Sprang, MD  omeprazole (PRILOSEC) 20 MG capsule Take 20 mg by mouth daily.     Yes Historical Provider, MD  simvastatin (ZOCOR) 20 MG tablet Take 1 tablet (20 mg total) by mouth at bedtime. 11/13/13  Yes Minna Merritts, MD  Tamsulosin HCl (FLOMAX) 0.4 MG CAPS Take 0.4 mg by mouth daily after supper.   Yes Historical Provider, MD  torsemide (DEMADEX) 20 MG tablet Take 20 mg by mouth daily as needed (for weight gain).  07/12/13  Yes Minna Merritts, MD  traZODone (DESYREL) 50 MG tablet Take 1 tablet (50 mg total) by mouth at bedtime. 09/03/14  Yes Minna Merritts, MD  Naproxen Sodium (ALEVE PO) Take 2 tablets by mouth at bedtime as needed (for pain).    Historical Provider, MD      VITAL SIGNS:  Blood pressure 129/73, pulse 62, temperature 97.7 F (36.5 C), temperature source Oral, resp. rate 20, height 6\' 1"  (1.854 m), weight 107.502 kg (237 lb), SpO2 76 %.  PHYSICAL EXAMINATION:  GENERAL:  81 y.o.-year-old patient lying in the bed with no acute distress.  EYES: Pupils equal, round, reactive to light and accommodation. No scleral icterus. Extraocular muscles intact.  HEENT: Head  atraumatic, normocephalic. Oropharynx and nasopharynx clear.  NECK:  Supple, no jugular venous distention. No thyroid enlargement, no tenderness.  LUNGS: Diminished breath sounds bilaterally, no wheezing, rales,rhonchi or crepitation. No use of accessory muscles of respiration.  CARDIOVASCULAR: S1, S2 normal. No murmurs, rubs, or gallops.  ABDOMEN: Soft, nontender, nondistended. Bowel sounds present. No organomegaly or mass.  EXTREMITIES: No pedal edema, cyanosis, or clubbing. Left leg is in splint NEUROLOGIC: Cranial nerves II through XII are intact. Muscle strength 5/5 in all extremities. Sensation intact. Gait not checked.  PSYCHIATRIC: The patient is alert  and oriented x 3.  SKIN: No obvious rash, lesion, or ulcer.   LABORATORY PANEL:   CBC No results for input(s): WBC, HGB, HCT, PLT in the last 168 hours. ------------------------------------------------------------------------------------------------------------------  Chemistries  No results for input(s): NA, K, CL, CO2, GLUCOSE, BUN, CREATININE, CALCIUM, MG, AST, ALT, ALKPHOS, BILITOT in the last 168 hours.  Invalid input(s): GFRCGP ------------------------------------------------------------------------------------------------------------------  Cardiac Enzymes No results for input(s): TROPONINI in the last 168 hours. ------------------------------------------------------------------------------------------------------------------  RADIOLOGY:  Dg Ankle 2 Views Left  01/07/2015   CLINICAL DATA:  Status post fall today with medial and lateral malleolar fractures. Postreduction films. Initial encounter.  EXAM: LEFT ANKLE - 2 VIEW  COMPARISON:  Plain films earlier today.  FINDINGS: Marked lateral and posterior subluxation of the talus at the tibiotalar joint is unchanged. Medial and lateral malleolar fractures are again seen without change in position or alignment. The patient is in a fiberglass splint.  IMPRESSION: No marked change  in position or alignment of medial and lateral malleolar fractures and medial and posterior subluxation of the talus at the tibiotalar joint.   Electronically Signed   By: Inge Rise M.D.   On: 01/07/2015 16:12   Dg Ankle Complete Left  01/07/2015   CLINICAL DATA:  [place] today and injured left ankle.  EXAM: LEFT ANKLE COMPLETE - 3+ VIEW  COMPARISON:  None.  FINDINGS: Bimalleolar ankle fractures are demonstrated. There is a displaced transverse fracture through the medial malleolus at the level of the ankle mortise. The talus is subluxed laterally approximately 1 cm. There is also a long oblique displaced fracture of the distal fibular shaft above the ankle mortise. No talus fracture is identified. The subtalar joints are maintained.  IMPRESSION: Displaced bimalleolar ankle fractures with moderate lateral subluxation of the talus in relation to the tibia.   Electronically Signed   By: Marijo Sanes M.D.   On: 01/07/2015 14:03    EKG:   Orders placed or performed in visit on 09/03/14  . EKG 12-Lead    IMPRESSION AND PLAN:    Active Problems:   Bimalleolar fracture of left ankle 1. Left ankle fracture, closed. Initial encounter, continue pain medications. Patient is at low intermediate risk for perioperative cardiovascular and pulmonary complications, continue current cardiac medications and initiate patient on duo nebs every 4 hours round the clock, risks were discussed with patient as well as Dr. Earnestine Leys , all QUESTIONS were answered. Patient voiced understanding 2. COPD, stable. Continue Mucinex as well as duo nebs as above 3. Coronary artery disease, stable. Continue aspirin, Zocor, metoprolol perioperatively 4. Atrial fibrillation, chronic. Hold Eliquis,  resume it per surgery       All the records are reviewed and case discussed with Consulting provider. Management plans discussed with the patient, family and they are in agreement.  CODE STATUS: Full code  TOTAL TIME  TAKING CARE OF THIS PATIENT: 50  minutes.    Theodoro Grist M.D on 01/07/2015 at 5:23 PM  Between 7am to 6pm - Pager - [phone]  After 6pm go to [email or web address] - password [email or web address] [facility]  [place] [facility]  Office  [phone]  CC: Primary care Physician: Maryland Pink, MD

## 2015-01-07 NOTE — ED Notes (Signed)
Per EMS pt fell at home today and hurt his ankle. EMS states obvious deformity to the L ankle. Per EMS pt fell from a standing position, and usually walks with a cane at home. Pt denies hitting his head or LOC.

## 2015-01-08 ENCOUNTER — Inpatient Hospital Stay: Payer: Commercial Managed Care - HMO | Admitting: Anesthesiology

## 2015-01-08 ENCOUNTER — Encounter: Payer: Self-pay | Admitting: Anesthesiology

## 2015-01-08 ENCOUNTER — Encounter
Admission: EM | Disposition: A | Discharge status: Skilled Nursing Facility | Payer: Self-pay | Source: Home / Self Care | Attending: Specialist

## 2015-01-08 DIAGNOSIS — Z01810 Encounter for preprocedural cardiovascular examination: Secondary | ICD-10-CM

## 2015-01-08 DIAGNOSIS — I482 Chronic atrial fibrillation: Secondary | ICD-10-CM

## 2015-01-08 DIAGNOSIS — R079 Chest pain, unspecified: Secondary | ICD-10-CM | POA: Diagnosis not present

## 2015-01-08 HISTORY — PX: ORIF ANKLE FRACTURE: SHX5408

## 2015-01-08 LAB — TROPONIN I: Troponin I: <0.03 ng/mL (ref <0.031)

## 2015-01-08 SURGERY — OPEN REDUCTION INTERNAL FIXATION (ORIF) ANKLE FRACTURE
Anesthesia: General | Site: Ankle | Laterality: Left | Wound class: Clean Contaminated

## 2015-01-08 MED ORDER — ONDANSETRON HCL 4 MG/2ML IJ SOLN: 4.0000 mg | Freq: Once | INTRAMUSCULAR | Status: DC | PRN

## 2015-01-08 MED ORDER — METOCLOPRAMIDE HCL 5 MG/ML IJ SOLN: 5.0000 mg | Freq: Three times a day (TID) | INTRAMUSCULAR | Status: DC | PRN

## 2015-01-08 MED ORDER — NEOMYCIN-POLYMYXIN B GU 40-200000 IR SOLN
Status: AC
  Filled 2015-01-08: qty 4

## 2015-01-08 MED ORDER — SODIUM CHLORIDE 0.45 % IV SOLN
INTRAVENOUS | Status: DC
  Administered 2015-01-09 – 2015-01-10 (×3): via INTRAVENOUS

## 2015-01-08 MED ORDER — DEXMEDETOMIDINE HCL 200 MCG/2ML IV SOLN
INTRAVENOUS | Status: DC | PRN
  Administered 2015-01-08: 12 ug via INTRAVENOUS

## 2015-01-08 MED ORDER — NEOMYCIN-POLYMYXIN B GU 40-200000 IR SOLN
Status: AC
  Filled 2015-01-08: qty 20

## 2015-01-08 MED ORDER — SENNA 8.6 MG PO TABS
1.0000 | ORAL_TABLET | Freq: Two times a day (BID) | ORAL | Status: DC
  Administered 2015-01-09 – 2015-01-10 (×3): 8.6 mg via ORAL
  Filled 2015-01-08 (×3): qty 1

## 2015-01-08 MED ORDER — FENTANYL CITRATE (PF) 100 MCG/2ML IJ SOLN: 25.0000 ug | INTRAMUSCULAR | Status: DC | PRN

## 2015-01-08 MED ORDER — LIDOCAINE HCL (CARDIAC) 20 MG/ML IV SOLN
INTRAVENOUS | Status: DC | PRN
  Administered 2015-01-08: 100 mg via INTRAVENOUS

## 2015-01-08 MED ORDER — MIDAZOLAM HCL 5 MG/5ML IJ SOLN
1.0000 mg | Freq: Once | INTRAMUSCULAR | Status: AC
  Administered 2015-01-08: 1 mg via INTRAVENOUS

## 2015-01-08 MED ORDER — ONDANSETRON HCL 4 MG/2ML IJ SOLN: 4.0000 mg | Freq: Four times a day (QID) | INTRAMUSCULAR | Status: DC | PRN

## 2015-01-08 MED ORDER — NITROGLYCERIN 0.4 MG SL SUBL: 0.4000 mg | SUBLINGUAL_TABLET | SUBLINGUAL | Status: DC | PRN

## 2015-01-08 MED ORDER — ROCURONIUM BROMIDE 100 MG/10ML IV SOLN
INTRAVENOUS | Status: DC | PRN
  Administered 2015-01-08: 50 mg via INTRAVENOUS
  Administered 2015-01-08 (×2): 15 mg via INTRAVENOUS

## 2015-01-08 MED ORDER — CIPROFLOXACIN IN D5W 400 MG/200ML IV SOLN
INTRAVENOUS | Status: DC | PRN
  Administered 2015-01-08: 400 mg via INTRAVENOUS

## 2015-01-08 MED ORDER — MORPHINE SULFATE (PF) 2 MG/ML IV SOLN: 2.0000 mg | INTRAVENOUS | Status: DC | PRN

## 2015-01-08 MED ORDER — METHOCARBAMOL 1000 MG/10ML IJ SOLN: 500.0000 mg | Freq: Four times a day (QID) | INTRAVENOUS | Status: DC | PRN

## 2015-01-08 MED ORDER — METOCLOPRAMIDE HCL 5 MG PO TABS: 5.0000 mg | ORAL_TABLET | Freq: Three times a day (TID) | ORAL | Status: DC | PRN

## 2015-01-08 MED ORDER — MIDAZOLAM BOLUS VIA INFUSION: 1.0000 mg | Freq: Once | INTRAVENOUS | Status: DC

## 2015-01-08 MED ORDER — FUROSEMIDE 10 MG/ML IJ SOLN
10.0000 mg | Freq: Once | INTRAMUSCULAR | Status: AC
  Administered 2015-01-08: 10 mg via INTRAVENOUS

## 2015-01-08 MED ORDER — METHOCARBAMOL 500 MG PO TABS: 500.0000 mg | ORAL_TABLET | Freq: Four times a day (QID) | ORAL | Status: DC | PRN

## 2015-01-08 MED ORDER — ASPIRIN 325 MG PO TABS
325.0000 mg | ORAL_TABLET | Freq: Every day | ORAL | Status: DC
  Filled 2015-01-08: qty 1

## 2015-01-08 MED ORDER — ONDANSETRON HCL 4 MG/2ML IJ SOLN
4.0000 mg | INTRAMUSCULAR | Status: AC
  Administered 2015-01-08: 4 mg via INTRAVENOUS

## 2015-01-08 MED ORDER — CELECOXIB 200 MG PO CAPS
200.0000 mg | ORAL_CAPSULE | Freq: Two times a day (BID) | ORAL | Status: DC
  Administered 2015-01-09 – 2015-01-10 (×3): 200 mg via ORAL
  Filled 2015-01-08 (×3): qty 1

## 2015-01-08 MED ORDER — HYDROCODONE-ACETAMINOPHEN 7.5-325 MG PO TABS: 1.0000 | ORAL_TABLET | ORAL | Status: DC | PRN

## 2015-01-08 MED ORDER — ONDANSETRON HCL 4 MG PO TABS: 4.0000 mg | ORAL_TABLET | Freq: Four times a day (QID) | ORAL | Status: DC | PRN

## 2015-01-08 MED ORDER — FLUMAZENIL 0.5 MG/5ML IV SOLN
0.2000 mg | Freq: Once | INTRAVENOUS | Status: AC
  Administered 2015-01-08: 0.2 mg via INTRAVENOUS

## 2015-01-08 MED ORDER — ACETAMINOPHEN 325 MG PO TABS: 650.0000 mg | ORAL_TABLET | Freq: Four times a day (QID) | ORAL | Status: DC | PRN

## 2015-01-08 MED ORDER — DEXTROSE 5 % IV SOLN
10.0000 mg | INTRAVENOUS | Status: DC | PRN
  Administered 2015-01-08: 25 ug/min via INTRAVENOUS

## 2015-01-08 MED ORDER — SODIUM CHLORIDE 0.9 % IV SOLN
INTRAVENOUS | Status: DC | PRN
  Administered 2015-01-08: 20:00:00 via INTRAVENOUS

## 2015-01-08 MED ORDER — FENTANYL CITRATE (PF) 100 MCG/2ML IJ SOLN
INTRAMUSCULAR | Status: DC | PRN
  Administered 2015-01-08 (×2): 100 ug via INTRAVENOUS

## 2015-01-08 MED ORDER — ACETAMINOPHEN 650 MG RE SUPP: 650.0000 mg | Freq: Four times a day (QID) | RECTAL | Status: DC | PRN

## 2015-01-08 MED ORDER — HYDROCODONE-ACETAMINOPHEN 7.5-325 MG PO TABS
1.0000 | ORAL_TABLET | Freq: Four times a day (QID) | ORAL | Status: DC
  Administered 2015-01-09 – 2015-01-10 (×6): 1 via ORAL
  Filled 2015-01-08 (×6): qty 1

## 2015-01-08 MED ORDER — CEFAZOLIN SODIUM-DEXTROSE 2-3 GM-% IV SOLR
2.0000 g | Freq: Four times a day (QID) | INTRAVENOUS | Status: AC
  Administered 2015-01-09 (×3): 2 g via INTRAVENOUS
  Filled 2015-01-08 (×3): qty 50

## 2015-01-08 MED ORDER — CLINDAMYCIN PHOSPHATE 600 MG/50ML IV SOLN
600.0000 mg | Freq: Once | INTRAVENOUS | Status: DC
  Filled 2015-01-08 (×3): qty 50

## 2015-01-08 MED ORDER — BUPIVACAINE HCL (PF) 0.5 % IJ SOLN
INTRAMUSCULAR | Status: AC
  Filled 2015-01-08: qty 30

## 2015-01-08 MED ORDER — CLINDAMYCIN PHOSPHATE 600 MG/50ML IV SOLN
600.0000 mg | Freq: Three times a day (TID) | INTRAVENOUS | Status: AC
  Administered 2015-01-09 (×3): 600 mg via INTRAVENOUS
  Filled 2015-01-08 (×3): qty 50

## 2015-01-08 MED ORDER — ACETAMINOPHEN 10 MG/ML IV SOLN
INTRAVENOUS | Status: DC | PRN
  Administered 2015-01-08: 1000 mg via INTRAVENOUS

## 2015-01-08 MED ORDER — ACETAMINOPHEN 10 MG/ML IV SOLN
INTRAVENOUS | Status: AC
  Filled 2015-01-08: qty 100

## 2015-01-08 MED ORDER — FLEET ENEMA 7-19 GM/118ML RE ENEM: 1.0000 | ENEMA | Freq: Once | RECTAL | Status: DC | PRN

## 2015-01-08 MED ORDER — PHENYLEPHRINE HCL 10 MG/ML IJ SOLN
INTRAMUSCULAR | Status: DC | PRN
  Administered 2015-01-08 (×2): 100 ug via INTRAVENOUS

## 2015-01-08 MED ORDER — BISACODYL 10 MG RE SUPP
10.0000 mg | Freq: Every day | RECTAL | Status: DC | PRN
  Administered 2015-01-10: 10 mg via RECTAL
  Filled 2015-01-08 (×2): qty 1

## 2015-01-08 MED ORDER — MAGNESIUM HYDROXIDE 400 MG/5ML PO SUSP
30.0000 mL | Freq: Every day | ORAL | Status: DC | PRN
  Administered 2015-01-09: 30 mL via ORAL
  Filled 2015-01-08: qty 30

## 2015-01-08 MED ORDER — GLYCOPYRROLATE 0.2 MG/ML IJ SOLN
INTRAMUSCULAR | Status: DC | PRN
  Administered 2015-01-08: .5 mg via INTRAVENOUS

## 2015-01-08 MED ORDER — SUGAMMADEX SODIUM 500 MG/5ML IV SOLN
INTRAVENOUS | Status: DC | PRN
  Administered 2015-01-08: 200 mg via INTRAVENOUS

## 2015-01-08 MED ORDER — APIXABAN 2.5 MG PO TABS
2.5000 mg | ORAL_TABLET | Freq: Two times a day (BID) | ORAL | Status: DC
  Administered 2015-01-09 – 2015-01-10 (×3): 2.5 mg via ORAL
  Filled 2015-01-08 (×3): qty 1

## 2015-01-08 MED ORDER — BUPIVACAINE HCL 0.5 % IJ SOLN
INTRAMUSCULAR | Status: DC | PRN
  Administered 2015-01-08: 30 mL

## 2015-01-08 MED ORDER — PROPOFOL 10 MG/ML IV BOLUS
INTRAVENOUS | Status: DC | PRN
  Administered 2015-01-08: 120 mg via INTRAVENOUS

## 2015-01-08 MED ORDER — NEOMYCIN-POLYMYXIN B GU 40-200000 IR SOLN
Status: DC | PRN
  Administered 2015-01-08: 4 mL

## 2015-01-08 MED ORDER — NEOSTIGMINE METHYLSULFATE 10 MG/10ML IV SOLN
INTRAVENOUS | Status: DC | PRN
  Administered 2015-01-08: 3 mg via INTRAVENOUS

## 2015-01-08 SURGICAL SUPPLY — 51 items
BIT DRILL 2.5X110 QC LCP DISP (BIT) ×2 IMPLANT
BIT DRILL 2.8 (BIT) ×1
BIT DRILL CANN QC 2.8X165 (BIT) IMPLANT
BIT DRILL CANNULATED 1.7MM (BIT) ×2 IMPLANT
BLADE SURG SZ10 CARB STEEL (BLADE) ×3 IMPLANT
BNDG COHESIVE 4X5 TAN STRL (GAUZE/BANDAGES/DRESSINGS) ×3 IMPLANT
BNDG ESMARK 6X12 TAN STRL LF (GAUZE/BANDAGES/DRESSINGS) ×3 IMPLANT
CANISTER SUCT 1200ML W/VALVE (MISCELLANEOUS) ×3 IMPLANT
CHLORAPREP W/TINT 26ML (MISCELLANEOUS) ×3 IMPLANT
COVER LIGHT HANDLE STERIS (MISCELLANEOUS) ×2 IMPLANT
DRAPE FLUOR MINI C-ARM 54X84 (DRAPES) ×3 IMPLANT
DRILL BIT 2.8MM (BIT) ×3
GAUZE PETRO XEROFOAM 1X8 (MISCELLANEOUS) ×3 IMPLANT
GAUZE SPONGE 4X4 12PLY STRL (GAUZE/BANDAGES/DRESSINGS) ×3 IMPLANT
GLOVE SURG ORTHO 8.0 STRL STRW (GLOVE) ×11 IMPLANT
GOWN STRL REUS W/ TWL LRG LVL3 (GOWN DISPOSABLE) ×2 IMPLANT
GOWN STRL REUS W/TWL LRG LVL3 (GOWN DISPOSABLE) ×6
GUIDEWIRE 0.8MM (WIRE) ×4 IMPLANT
HANDLE YANKAUER SUCT BULB TIP (MISCELLANEOUS) ×3 IMPLANT
KIT RM TURNOVER STRD PROC AR (KITS) ×3 IMPLANT
LABEL OR SOLS (LABEL) ×3 IMPLANT
LAPSAC SURG PACK 8X10 (MISCELLANEOUS) ×3
NS IRRIG 1000ML POUR BTL (IV SOLUTION) ×3 IMPLANT
PACK EXTREMITY ARMC (MISCELLANEOUS) ×3 IMPLANT
PACK SURG LAPSAC 8X10 (MISCELLANEOUS) IMPLANT
PAD ABD DERMACEA PRESS 5X9 (GAUZE/BANDAGES/DRESSINGS) ×4 IMPLANT
PAD GROUND ADULT SPLIT (MISCELLANEOUS) ×3 IMPLANT
PAD PREP 24X41 OB/GYN DISP (PERSONAL CARE ITEMS) ×3 IMPLANT
PROS LCP PLATE 8H 111M (Plate) ×3 IMPLANT
PROSTHESIS LCP PLATE 8H 111M (Plate) IMPLANT
SCREW CANN L THRD/48 4.0 (Screw) ×4 IMPLANT
SCREW CORTEX 3.5 16MM (Screw) ×4 IMPLANT
SCREW CORTEX 3.5 18MM (Screw) ×4 IMPLANT
SCREW CORTEX 3.5 20MM (Screw) ×6 IMPLANT
SCREW LOCK CORT ST 3.5X16 (Screw) IMPLANT
SCREW LOCK CORT ST 3.5X18 (Screw) IMPLANT
SCREW LOCK CORT ST 3.5X20 (Screw) IMPLANT
SCREW LOCK T15 FT 22X3.5XST (Screw) IMPLANT
SCREW LOCKING 3.5X22 (Screw) ×6 IMPLANT
SPLINT CAST 1 STEP 5X30 WHT (MISCELLANEOUS) ×3 IMPLANT
SPLINT FAST PLASTER 5X30 (CAST SUPPLIES) ×2
SPLINT PLASTER CAST FAST 5X30 (CAST SUPPLIES) IMPLANT
SPONGE LAP 18X18 5 PK (GAUZE/BANDAGES/DRESSINGS) ×3 IMPLANT
STAPLER SKIN PROX 35W (STAPLE) ×3 IMPLANT
STOCKINETTE BIAS CUT 6 980064 (GAUZE/BANDAGES/DRESSINGS) ×3 IMPLANT
STOCKINETTE STRL 6IN 960660 (GAUZE/BANDAGES/DRESSINGS) ×3 IMPLANT
SUT QUILL PDO 0 36 36 VIOLET (SUTURE) ×2 IMPLANT
SUT VIC AB 2-0 CT1 27 (SUTURE) ×3
SUT VIC AB 2-0 CT1 TAPERPNT 27 (SUTURE) ×1 IMPLANT
SUT VIC AB 3-0 SH 27 (SUTURE) ×3
SUT VIC AB 3-0 SH 27X BRD (SUTURE) ×1 IMPLANT

## 2015-01-08 NOTE — Progress Notes (Signed)
Patient awake, still disoriented, trying to get out of bed at times, VSS, Dr Kayleen Memos at bedside, patient transported to his room in hopes that it will help with orientation,patient verbal and cooperating at this time

## 2015-01-08 NOTE — Progress Notes (Signed)
Patient resting in bed, no complain of chest pain or nausea. Family at the bedside.

## 2015-01-08 NOTE — Progress Notes (Signed)
Subjective: Day of Surgery Procedure(s) (LRB): OPEN REDUCTION INTERNAL FIXATION (ORIF) ANKLE FRACTURE (Left)    Patient reports pain as moderate.  He has had nausea and vomiting over the nighttime.  Dr. Amie Critchley from anesthesia is evaluating the patient for possible cardiac problems.  His surgery will be postponed this morning until he can be cleared medically for this procedure.  Patient's wife and daughter were present for this discussion.  Objective:   VITALS:   Filed Vitals:   01/08/15 0936  BP: 162/73  Pulse: 61  Temp: 97.6 F (36.4 C)  Resp:     Neurovascular intact Sensation intact distally Intact pulses distally  LABS  Recent Labs  01/07/15 1955  HGB 11.8*  HCT 35.8*  WBC 9.2  PLT 150     Recent Labs  01/07/15 1955  NA 142  K 4.3  BUN 29*  CREATININE 2.33*  GLUCOSE 100*     Recent Labs  01/07/15 1955  INR 1.25     Assessment/Plan: Day of Surgery Procedure(s) (LRB): OPEN REDUCTION INTERNAL FIXATION (ORIF) ANKLE FRACTURE (Left)   Hopefully, we will proceed with surgery later today if cleared by the medical service.

## 2015-01-08 NOTE — Clinical Social Work Note (Signed)
Clinical Social Work Assessment  Patient Details  Name: [name] MRN: [id number] Date of Birth: Aug 10, 1933  Date of referral:  01/08/15               Reason for consult:  Facility Placement                Permission sought to share information with:  Chartered certified accountant granted to share information::  Yes, Verbal Permission Granted  Name::      [place]::   [place]   Relationship::     Contact Information:     Housing/Transportation Living arrangements for the past 2 months:  [place] of Information:  Patient, Spouse Patient Interpreter Needed:  None Criminal Activity/Legal Involvement Pertinent to Current Situation/Hospitalization:    Significant Relationships:  Adult Children, Spouse Lives with:  Spouse Do you feel safe going back to the place where you live?  Yes Need for family participation in patient care:  Yes (Comment)  Care giving concerns: Patient lives with his wife [name] in [place].    Social Worker assessment / plan: Patient has an ankle fracture and will have surgery with Dr. Sabra Heck. Clinical Social Worker (CSW) met with patient and his wife [name], daughter [name] [phone], and his sister were at bedside. CSW introduced self and explained role of CSW department. Patient was alert and oriented and sitting in the bed. Patient reported that he lives with his wife in [place]. Patient reported that he has broken his foot before and went to [facility] . Patient prefers to go to rehab again and prefers [facility] or [facility]. CSW explained that patient's [facility] insurance requires pre-authorization. Patient verbalized his understanding.     Employment status:  Disabled (Comment on whether or not currently receiving Disability), Retired Nurse, adult PT Recommendations:  Not assessed at this time Information / Referral to community resources:  [place]  Patient/Family's Response to care: Patient and family are agreeable for patient to go to rehab.   Patient/Family's Understanding of and Emotional Response to Diagnosis, Current Treatment, and Prognosis: Patient was pleasant throughout assessment and thanked CSW for visit.   Emotional Assessment Appearance:  Appears stated age Attitude/Demeanor/Rapport:    Affect (typically observed):  Accepting, Adaptable, Pleasant Orientation:  Oriented to Self, Oriented to Place, Oriented to  Time, Oriented to Situation Alcohol / Substance use:  Not Applicable Psych involvement (Current and /or in the community):  No (Comment)  Discharge Needs  Concerns to be addressed:  Discharge Planning Concerns Readmission within the last 30 days:  No Current discharge risk:  None Barriers to Discharge:  Continued Medical Work up   Loralyn [place], LCSW 01/08/2015, 5:19 PM

## 2015-01-08 NOTE — Consult Note (Signed)
CARDIOLOGY CONSULT NOTE  Patient ID: [name] MRN: [id number] DOB/AGE: 1933-05-23 81 y.o.  Admit date: 01/07/2015 Referring Physician : Dr. Bridgett Larsson Primary Cardiologist : Dr. Rockey Situ Reason for Consultation : chest pain before surgery  HPI:  81 year old gentleman with a history of coronary artery disease, bypass surgery in 1999 with catheterization in 2007 with patent LIMA to the LAD, occluded vein graft to the OM, history of Cypher stent to the PL branch and mid RCA in September 07, ejection fraction 45-50%, chronic atrial fibrillation with Medtronic pacemaker, hx of previous recurrent falls and injury  The patient presented yesterday after a fall with a left ankle fracture. Anticoagulation was held in anticipation of surgery. The patient was given an oxycodone this morning. He felt sick on his stomach and vomited once. By the time he arrived to the preoperative area he complained of left shoulder pain and pain in the left upper chest area. There was no substernal tightness. The pain was described as aching. It resolved without requiring nitroglycerin. ECG showed atrial fibrillation with ventricular paced rhythm and T wave changes in the anterolateral leads which are not new compared to his old EKGs. He is currently pain-free and feels back to his baseline.    Review of systems complete and found to be negative unless listed above   Past Medical History  Diagnosis Date  . Chronic atrial fibrillation     a. on pradaxa  . Coronary artery disease     a. s/p MI x 3;  b. s/p CABG x2 in 1999;  c. Cath 2007: LIMA->LAD patent, VG->OM 100, PCI/DES of RPL/mid RCA (cypher DES).  . Chronic obstructive pulmonary disease   . GERD (gastroesophageal reflux disease)   . Hyperlipidemia   . Hypertension   . PVD (peripheral vascular disease)   . AAA (abdominal aortic aneurysm)     a. s/p repair - 1999 @ [facility] @ time of CABG.  . Chronic diastolic CHF (congestive heart failure)     a. 01/2012 Echo:   EF 55%.  . Tachy-brady syndrome     a. s/p PPM 2010.  [name] CKD (chronic kidney disease), stage III     a. Acute on chronic 03/2012  . Cancer     skin cancer nose  . Sleep apnea   . Broken ankle     Family History  Problem Relation Age of Onset  . Stomach cancer Mother     died @ 52  . Diabetes Mother   . Hypertension Mother   . Coronary artery disease Mother   . Cerebral aneurysm Father     died @ 74  . Diabetes Father   . Hypertension Father   . Coronary artery disease Father     Social History   Social History  . Marital Status: Married    Spouse Name: N/A  . Number of Children: N/A  . Years of Education: N/A   Occupational History  . Not on file.   Social History Main Topics  . Smoking status: Former Smoker -- 1.00 packs/day for 20 years    Quit date: 04/11/1997  . Smokeless tobacco: Never Used  . Alcohol Use: No     Comment: Rare  . Drug Use: No  . Sexual Activity: Not on file   Other Topics Concern  . Not on file   Social History Narrative   Lives locally with his wife.  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      Past Surgical  History  Procedure Laterality Date  . Coronary artery bypass graft    . Abdominal aortic aneurysm repair    . Insert / replace / remove pacemaker    . Foot surgery      left foot surgery to remove cyst.  . Trigger finger release    . Skin cancer biopsy      nose  . Ankle surgery Right      Prescriptions prior to admission  Medication Sig Dispense Refill Last Dose  . apixaban (ELIQUIS) 2.5 MG TABS tablet Take 1 tablet (2.5 mg total) by mouth 2 (two) times daily. 60 tablet 11 01/07/2015 at Unknown time  . diltiazem (CARDIZEM CD) 120 MG 24 hr capsule Take 1 capsule (120 mg total) by mouth daily. 30 capsule 3 01/07/2015 at Unknown time  . donepezil (ARICEPT) 5 MG tablet Take 5 mg by mouth at bedtime.   01/06/2015 at Unknown time  . guaiFENesin (MUCINEX) 600 MG 12 hr tablet Take 1 tablet (600 mg total) by mouth 2 (two)  times daily. 30 tablet 0 prn at prn  . ipratropium-albuterol (DUONEB) 0.5-2.5 (3) MG/3ML SOLN Take 3 mLs by nebulization every 4 (four) hours as needed (for wheezing). 360 mL 0 prn at prn  . metoprolol succinate (TOPROL-XL) 100 MG 24 hr tablet Take 1 tablet (100 mg total) by mouth daily. 30 tablet 3 01/07/2015 at Unknown time  . omeprazole (PRILOSEC) 20 MG capsule Take 20 mg by mouth daily.     01/07/2015 at Unknown time  . simvastatin (ZOCOR) 20 MG tablet Take 1 tablet (20 mg total) by mouth at bedtime. 30 tablet 6 01/06/2015 at Unknown time  . Tamsulosin HCl (FLOMAX) 0.4 MG CAPS Take 0.4 mg by mouth daily after supper.   01/06/2015 at Unknown time  . torsemide (DEMADEX) 20 MG tablet Take 20 mg by mouth daily as needed (for weight gain).    Past Week at Unknown time  . traZODone (DESYREL) 50 MG tablet Take 1 tablet (50 mg total) by mouth at bedtime. 30 tablet 3 prn at prn  . Naproxen Sodium (ALEVE PO) Take 2 tablets by mouth at bedtime as needed (for pain).   prn at prn    Physical Exam: Blood pressure 115/52, pulse 69, temperature 97.5 F (36.4 C), temperature source Oral, resp. rate 16, height 6\' 1"  (1.854 m), weight 237 lb (107.502 kg), SpO2 94 %.  Constitutional: He is oriented to person, place, and time. He appears well-developed and well-nourished. No distress.  HENT: No nasal discharge.  Head: Normocephalic and atraumatic.  Eyes: Pupils are equal and round.  No discharge. Neck: Normal range of motion. Neck supple. No JVD present. No thyromegaly present.  Cardiovascular: Normal rate, irregular rhythm, normal heart sounds. Exam reveals no gallop and no friction rub. No murmur heard.  Pulmonary/Chest: Effort normal and breath sounds normal. No stridor. No respiratory distress. He has no wheezes. He has no rales. He exhibits no tenderness.  Abdominal: Soft. Bowel sounds are normal. He exhibits no distension. There is no tenderness. There is no rebound and no guarding.  Musculoskeletal: Normal  range of motion. He exhibits trace edema and no tenderness.  Neurological: He is alert and oriented to person, place, and time. Coordination normal.  Skin: Skin is warm and dry. No rash noted. He is not diaphoretic. No erythema. No pallor.  Psychiatric: He has a normal mood and affect. His behavior is normal. Judgment and thought content normal.      Labs:  Lab Results  Component Value Date   WBC 9.2 01/07/2015   HGB 11.8* 01/07/2015   HCT 35.8* 01/07/2015   MCV 93.3 01/07/2015   PLT 150 01/07/2015    Recent Labs Lab 01/07/15 1955  NA 142  K 4.3  CL 110  CO2 25  BUN 29*  CREATININE 2.33*  CALCIUM 8.7*  PROT 5.9*  BILITOT 0.6  ALKPHOS 54  ALT 13*  AST 16  GLUCOSE 100*   Lab Results  Component Value Date   CKTOTAL 174 11/16/2009   CKMB 4.6* 11/16/2009   TROPONINI <0.03 01/08/2015       EKG: Atrial fibrillation with ventricular paced rhythm. T-wave inversion in the inferior and anterolateral leads suggestive of ischemia. These EKG changes were present on previous old EKGs. Most recent EKG showed a ventricular paced rhythm.  ASSESSMENT AND PLAN:   1. Atypical chest pain with known history of coronary artery disease: The chest pain does not seem to be cardiac in nature and overall atypical. EKG is abnormal but does not seem to be different from prior EKGs. Troponins negative. I do not think his symptoms represent acute coronary syndrome. Continue medical therapy.  2. Preoperative cardiovascular evaluation: The patient has extensive cardiac history but has been relatively stable. The above chest pain does not seem to be cardiac. The patient can proceed with surgery with an overall moderate risk given his age and comorbidities. Agree with holding Eliquis. Resume after surgery.   3. A-fib : Rate is controlled on current medications.   Signed: Kathlyn Sacramento MD, [facility] 01/08/2015, 4:21 PM

## 2015-01-08 NOTE — Progress Notes (Signed)
ekg staff here for stat ekg

## 2015-01-08 NOTE — Progress Notes (Signed)
Patient arrived to PACU, disoriented, combative, pulling at LDAs, 2215 patient getting out of bed, pulled out IV, code 300 called, patient lifted back to bed, versed given,patient sleeping after versed and oxygen sat dropped, saturation came up with stimulation... Romazicon given. Dr Kayleen Memos at bedside during the entire crisis. 2250 patient awake and calmer, oxygen sat staying in the 90s, see PACU flowsheet for VS, patient stabilized

## 2015-01-08 NOTE — Progress Notes (Signed)
Spoke with anes. Joe Piscitelol  About pt continued nausea after one dose zofran, orders received for additional zofran 4 mg iv

## 2015-01-08 NOTE — Progress Notes (Signed)
Dr.Miller here to see patient- discussing plan for surgery. Pending surgery later today post cardiology consult. Still NPO for now for surgery.

## 2015-01-08 NOTE — Op Note (Signed)
01/07/2015 - 01/08/2015  PATIENT:  [name]    PRE-OPERATIVE DIAGNOSIS: Bimalleolar left ANKLE FRACTURE dislocation  POST-OPERATIVE DIAGNOSIS:  Same  PROCEDURE:  OPEN REDUCTION INTERNAL FIXATION (ORIF) ANKLE FRACTURE medial and lateral  SURGEON:  Park Breed, MD  ANESTHESIA:   General  TOURNIQUET TIME: 77 MIN  PREOPERATIVE INDICATIONS:  [name] is a  81 y.o. male with a diagnosis of ANKLE FRACTURE who elected for surgical management to minimize the risk for malunion and nonunion and post-traumatic arthritis.    The risks benefits and alternatives were discussed with the patient preoperatively including but not limited to the risks of infection, bleeding, nerve injury, cardiopulmonary complications, the need for revision surgery, the need for hardware removal, among others, and the patient was willing to proceed.  OPERATIVE IMPLANTS: 8 hole Synthes locking plate, with 2 interfragmentary lag screwS, and two 4.0 mm cannulated screws for the medial malleolus.  OPERATIVE PROCEDURE: The patient was brought to the operating room and placed in the supine position. All bony prominences were padded. General anesthesia was administered. The lower extremity was prepped and draped in the usual sterile fashion. The leg was elevated and exsanguinated and the tourniquet was inflated. Time out was performed.   Incision was made over the distal fibula and the fracture was exposed and reduced anatomically with a clamp.  2.  Leg screws were placed. I then applied a small fragment locking plate and secured it proximally with cortical screws and distally with cortical and locking screws.  I used c-arm to confirm satisfactory reduction and fixation. This wound was irrigated and closed with 0 Quill sutures and staples.  I then turned my attention to the medial malleolus. Incision was made over the medial malleolus and the fracture exposed and held provisionally with a clamp. 2 guidepins were  placed for the 4.0 mm cannulated screws and then confirmation of reduction was made with fluoroscopy. I then placed 2  45mm screws which had satisfactory fixation.   The syndesmosis was stressed using live fluoroscopy and found to be stable.   The medial wound was irrigated, and closed with vicryl and staples. Sponge and needle counts were correct. The wounds were injected with local anesthetic. Sterile gauze was applied followed by a posterior splint. He was awakened and returned to the PACU in stable and satisfactory condition. There were no complications.  Park Breed, MD

## 2015-01-08 NOTE — Progress Notes (Addendum)
[facility] [place] NAME: [name]    MR#:  [id number]  DATE OF BIRTH:  Aug 21, 1933  SUBJECTIVE:  CHIEF COMPLAINT:   Chief Complaint  Patient presents with  . Fall  . Ankle Injury   Left shoulder pain and the chest pain this morning. REVIEW OF SYSTEMS:  CONSTITUTIONAL: No fever, fatigue or weakness.  EYES: No blurred or double vision.  EARS, NOSE, AND THROAT: No tinnitus or ear pain.  RESPIRATORY: No cough, shortness of breath, wheezing or hemoptysis.  CARDIOVASCULAR: Has chest pain, no orthopnea, edema.  GASTROINTESTINAL: No nausea, vomiting, diarrhea or abdominal pain.  GENITOURINARY: No dysuria, hematuria.  ENDOCRINE: No polyuria, nocturia,  HEMATOLOGY: No anemia, easy bruising or bleeding SKIN: No rash or lesion. MUSCULOSKELETAL: Right Ankle pain NEUROLOGIC: No tingling, numbness, weakness.  PSYCHIATRY: No anxiety or depression.   DRUG ALLERGIES:   Allergies  Allergen Reactions  . Atorvastatin Other (See Comments)    Arm pain    VITALS:  Blood pressure 162/73, pulse 61, temperature 97.6 F (36.4 C), temperature source Oral, resp. rate 16, height 6\' 1"  (1.854 m), weight 107.502 kg (237 lb), SpO2 93 %.  PHYSICAL EXAMINATION:  GENERAL:  81 y.o.-year-old patient lying in the bed with no acute distress.  EYES: Pupils equal, round, reactive to light and accommodation. No scleral icterus. Extraocular muscles intact.  HEENT: Head atraumatic, normocephalic. Oropharynx and nasopharynx clear.  NECK:  Supple, no jugular venous distention. No thyroid enlargement, no tenderness.  LUNGS: Normal breath sounds bilaterally, no wheezing, rales,rhonchi or crepitation. No use of accessory muscles of respiration.  CARDIOVASCULAR: S1, S2 normal. No murmurs, rubs, or gallops.  ABDOMEN: Soft, nontender, nondistended. Bowel sounds present. No organomegaly or mass.  EXTREMITIES: No pedal edema, cyanosis, or clubbing. Right ankle  dressing. Tenderness on the left shoulder. NEUROLOGIC: Cranial nerves II through XII are intact. Muscle strength 5/5 in all extremities except the right lower extremity. Sensation intact. Gait not checked.  PSYCHIATRIC: The patient is alert and oriented x 3.  SKIN: No obvious rash, lesion, or ulcer.    LABORATORY PANEL:   CBC  Recent Labs Lab 01/07/15 1955  WBC 9.2  HGB 11.8*  HCT 35.8*  PLT 150   ------------------------------------------------------------------------------------------------------------------  Chemistries   Recent Labs Lab 01/07/15 1955  NA 142  K 4.3  CL 110  CO2 25  GLUCOSE 100*  BUN 29*  CREATININE 2.33*  CALCIUM 8.7*  AST 16  ALT 13*  ALKPHOS 54  BILITOT 0.6   ------------------------------------------------------------------------------------------------------------------  Cardiac Enzymes  Recent Labs Lab 01/08/15 1001  TROPONINI <0.03   ------------------------------------------------------------------------------------------------------------------  RADIOLOGY:  Dg Ankle 2 Views Left  01/07/2015   CLINICAL DATA:  Status post fall today with medial and lateral malleolar fractures. Postreduction films. Initial encounter.  EXAM: LEFT ANKLE - 2 VIEW  COMPARISON:  Plain films earlier today.  FINDINGS: Marked lateral and posterior subluxation of the talus at the tibiotalar joint is unchanged. Medial and lateral malleolar fractures are again seen without change in position or alignment. The patient is in a fiberglass splint.  IMPRESSION: No marked change in position or alignment of medial and lateral malleolar fractures and medial and posterior subluxation of the talus at the tibiotalar joint.   Electronically Signed   By: Inge Rise M.D.   On: 01/07/2015 16:12   Dg Ankle Complete Left  01/07/2015   CLINICAL DATA:  [place] today and injured left ankle.  EXAM: LEFT ANKLE COMPLETE - 3+  VIEW  COMPARISON:  None.  FINDINGS: Bimalleolar ankle  fractures are demonstrated. There is a displaced transverse fracture through the medial malleolus at the level of the ankle mortise. The talus is subluxed laterally approximately 1 cm. There is also a long oblique displaced fracture of the distal fibular shaft above the ankle mortise. No talus fracture is identified. The subtalar joints are maintained.  IMPRESSION: Displaced bimalleolar ankle fractures with moderate lateral subluxation of the talus in relation to the tibia.   Electronically Signed   By: Marijo Sanes M.D.   On: 01/07/2015 14:03    EKG:   Orders placed or performed during the hospital encounter of 01/07/15  . EKG 12-Lead  . EKG 12-Lead    ASSESSMENT AND PLAN:   1. Left ankle fracture, closed.  Patient has low intermediate risk for perioperative cardiovascular and pulmonary complications. Follow-up Dr. Earnestine Leys for ankle surgery today  2. COPD, stable. Continue Mucinex as well as duo nebs as above 3. Coronary artery disease, stable. Continue aspirin, Zocor, metoprolol perioperatively 4. Atrial fibrillation, chronic. Rate controlled, Hold Eliquis, resume it per surgery 5. CKD stage IV. Stable.  * Chest pain. The patient complains of left shoulder pain with the left with adjacent chest pain in preop room this morning. Troponin level is negative, EKG showed paced rhythm at 72 bpm. He was treated with Nitro this morning. No chest pain on the left shoulder pain when I examined the patient is morning. Follow-up cardiology consult before surgery.  I discussed with Dr. Sabra Heck and Dr. Fletcher Anon. All the records are reviewed and case discussed with Care Management/Social Workerr. Management plans discussed with the patient, his wife and daughter and they are in agreement.  CODE STATUS: Full code  TOTAL TIME TAKING CARE OF THIS PATIENT: 42 minutes.   POSSIBLE D/C IN 3 DAYS, DEPENDING ON CLINICAL CONDITION.   Demetrios Loll M.D on 01/08/2015 at 1:55 PM  Between 7am to 6pm - Pager  - [phone]  After 6pm go to [email or web address] - password [email or web address] [facility]  [facility]  Office  [phone]  CC: Primary care physician; [place] Pink, MD

## 2015-01-08 NOTE — Progress Notes (Signed)
Dr.Arida here to see patient- update with patient's condition.

## 2015-01-08 NOTE — Progress Notes (Signed)
Orderly here to take patient to surgery via bed- oxygen at 2L per [place].  Chart sent to OR-consent/abx on chart, on tele monitor- ICU notified.

## 2015-01-08 NOTE — Anesthesia Procedure Notes (Signed)
Procedure Name: Intubation Date/Time: 01/08/2015 8:08 PM Performed by: Rosaria Ferries, DAVID Pre-anesthesia Checklist: Patient identified, Emergency Drugs available, Suction available and Patient being monitored Patient Re-evaluated:Patient Re-evaluated prior to inductionOxygen Delivery Method: Circle system utilized Preoxygenation: Pre-oxygenation with 100% oxygen Intubation Type: IV induction Laryngoscope Size: Mac and 3 Grade View: Grade II Tube type: Oral Tube size: 7.0 mm Number of attempts: 1 Placement Confirmation: ETT inserted through vocal cords under direct vision,  positive ETCO2 and breath sounds checked- equal and bilateral Secured at: 23 cm Tube secured with: Tape Dental Injury: Teeth and Oropharynx as per pre-operative assessment

## 2015-01-08 NOTE — Transfer of Care (Signed)
Immediate Anesthesia Transfer of Care Note  Patient: [name]  Procedure(s) Performed: Procedure(s): OPEN REDUCTION INTERNAL FIXATION (ORIF) ANKLE FRACTURE (Left)  Patient Location: PACU  Anesthesia Type:General  Level of Consciousness: awake  Airway & Oxygen Therapy: Patient Spontanous Breathing and Patient connected to nasal cannula oxygen  Post-op Assessment: Report given to RN and Post -op Vital signs reviewed and stable  Post vital signs: Reviewed and stable  Last Vitals:  Filed Vitals:   01/08/15 2204  BP: 152/87  Pulse:   Temp: 36.7 C  Resp: 18    Complications: No apparent anesthesia complications

## 2015-01-08 NOTE — Progress Notes (Addendum)
Patient return back from pre-op, unable to do surgery due to chest pain and vomiting, v/s assessed, assessed for chest pain, patient voiced no chest pain, placed on tele monitor . Paged Dr.Chen

## 2015-01-08 NOTE — Anesthesia Preprocedure Evaluation (Addendum)
Anesthesia Evaluation  Patient identified by MRN, date of birth, ID band Patient awake    Reviewed: Allergy & Precautions, NPO status , Patient's Chart, lab work & pertinent test results  Airway Mallampati: II  TM Distance: >3 FB Neck ROM: Full    Dental  (+) Chipped   Pulmonary sleep apnea , COPD,  COPD inhaler, former smoker,   Few rhonchi         Cardiovascular hypertension, Pt. on medications and Pt. on home beta blockers + CAD, + Peripheral Vascular Disease and +CHF  + pacemaker  Rhythm:Irregular     Neuro/Psych negative neurological ROS  negative psych ROS   GI/Hepatic GERD  Medicated and Controlled,  Endo/Other  negative endocrine ROS  Renal/GU Renal InsufficiencyRenal disease  negative genitourinary   Musculoskeletal Fx of ankle   Abdominal Normal abdominal exam  (+) + obese,   Peds negative pediatric ROS (+)  Hematology negative hematology ROS (+)   Anesthesia Other Findings   Reproductive/Obstetrics                          Anesthesia Physical Anesthesia Plan  ASA: III and emergent  Anesthesia Plan: General   Post-op Pain Management:    Induction: Intravenous  Airway Management Planned: Oral ETT  Additional Equipment:   Intra-op Plan:   Post-operative Plan: Extubation in OR  Informed Consent: I have reviewed the patients History and Physical, chart, labs and discussed the procedure including the risks, benefits and alternatives for the proposed anesthesia with the patient or authorized representative who has indicated his/her understanding and acceptance.   Dental advisory given  Plan Discussed with: CRNA and Surgeon  Anesthesia Plan Comments: (Talked with the family.  The patient is at moderate risk secondary to Pulmonary and cardiac issues.  Patient and family understand and agree to GOT since patient has recently taken his eliquis)      Anesthesia Quick  Evaluation

## 2015-01-08 NOTE — Progress Notes (Signed)
Spoke to [place]- update with pt's conditions and chest pain episode in pre-op , order received for stat ekg, stat trop, give nitro prn. MD will come see patient later.

## 2015-01-08 NOTE — Progress Notes (Signed)
Lab staff here to draw stat trop.

## 2015-01-08 NOTE — Progress Notes (Signed)
Called received from [place], report given on patient, waiting for orderly to take patient to OR.

## 2015-01-08 NOTE — Progress Notes (Signed)
Stopped in to introduce myself.  Patient very alert.  Wife and sister was with him.  He ask that I pray for him as he had broken his left ankle and just got his left foot surgery due to fall in January.  ---Offered support & prayer Chaplain Lewanda Rife Pager [phone]

## 2015-01-08 NOTE — Progress Notes (Signed)
Spoke with Butch Penny regarding unable to give metoprolol this am due to vomiting. Metoprolol oral sent in cart.  Orderly took patient to pre-op.

## 2015-01-09 ENCOUNTER — Encounter: Payer: Self-pay | Admitting: Specialist

## 2015-01-09 ENCOUNTER — Encounter
Admission: RE | Admit: 2015-01-09 | Discharge: 2015-01-09 | Disposition: A | Discharge status: Home / Self Care | Payer: Commercial Managed Care - HMO | Source: Ambulatory Visit | Attending: Internal Medicine | Admitting: Internal Medicine

## 2015-01-09 LAB — CBC
HEMATOCRIT: 33.4 % — AB (ref 40.0–52.0)
HEMOGLOBIN: 10.8 g/dL — AB (ref 13.0–18.0)
MCH: 30.2 pg (ref 26.0–34.0)
MCHC: 32.2 g/dL (ref 32.0–36.0)
MCV: 93.9 fL (ref 80.0–100.0)
Platelets: 114 10*3/uL — ABNORMAL LOW (ref 150–440)
RBC: 3.56 MIL/uL — ABNORMAL LOW (ref 4.40–5.90)
RDW: 15.3 % — AB (ref 11.5–14.5)
WBC: 6.8 10*3/uL (ref 3.8–10.6)

## 2015-01-09 LAB — BASIC METABOLIC PANEL
ANION GAP: 5 (ref 5–15)
BUN: 27 mg/dL — ABNORMAL HIGH (ref 6–20)
CALCIUM: 8.5 mg/dL — AB (ref 8.9–10.3)
CO2: 26 mmol/L (ref 22–32)
CREATININE: 2.41 mg/dL — AB (ref 0.61–1.24)
Chloride: 106 mmol/L (ref 101–111)
GFR calc non Af Amer: 24 mL/min — ABNORMAL LOW (ref >60)
GFR, EST AFRICAN AMERICAN: 28 mL/min — AB (ref >60)
Glucose, Bld: 93 mg/dL (ref 65–99)
Potassium: 5.2 mmol/L — ABNORMAL HIGH (ref 3.5–5.1)
SODIUM: 137 mmol/L (ref 135–145)

## 2015-01-09 NOTE — Progress Notes (Signed)
Plan is for patient to D/C to [facility] Saturday 01/10/15. Per Kim admissions coordinator at [facility] patient is going to room 213-A. RN will call report at [phone]. [facility] authorization has been received. Auth # [id number]. D/C Summary was sent to [facility] via carefinder today. Patient signed [facility] consent form. Clinical Social Worker (CSW) will continue to follow and assist as needed.   Blima Rich, [place] [phone]

## 2015-01-09 NOTE — Anesthesia Postprocedure Evaluation (Signed)
  Anesthesia Post-op Note  Patient: [name]  Procedure(s) Performed: Procedure(s): OPEN REDUCTION INTERNAL FIXATION (ORIF) ANKLE FRACTURE (Left)  Anesthesia type:General  Patient location: PACU  Post pain: Pain level controlled  Post assessment: Post-op Vital signs reviewed, Patient's Cardiovascular Status Stable, Respiratory Function Stable, Patent Airway and No signs of Nausea or vomiting  Post vital signs: Reviewed and stable  Last Vitals:  Filed Vitals:   01/08/15 2335  BP: 116/49  Pulse: 67  Temp: 36.8 C  Resp: 20    Level of consciousness: awake, alert  and patient cooperative  Complications: No apparent anesthesia complications... Some initial confusion and disorientation resolved with appropriate responses at end of PACU stay

## 2015-01-09 NOTE — Progress Notes (Signed)
[facility] NAME: [name]    MR#:  [id number]  DATE OF BIRTH:  31-Mar-1934  SUBJECTIVE:  CHIEF COMPLAINT:   Chief Complaint  Patient presents with  . Fall  . Ankle Injury   No complaint. REVIEW OF SYSTEMS:  CONSTITUTIONAL: No fever, fatigue or weakness.  EYES: No blurred or double vision.  EARS, NOSE, AND THROAT: No tinnitus or ear pain.  RESPIRATORY: No cough, shortness of breath, wheezing or hemoptysis.  CARDIOVASCULAR: Has chest pain, no orthopnea, edema.  GASTROINTESTINAL: No nausea, vomiting, diarrhea or abdominal pain.  GENITOURINARY: No dysuria, hematuria.  ENDOCRINE: No polyuria, nocturia,  HEMATOLOGY: No anemia, easy bruising or bleeding SKIN: No rash or lesion. MUSCULOSKELETAL: No joint pain. NEUROLOGIC: No tingling, numbness, weakness.  PSYCHIATRY: No anxiety or depression.   DRUG ALLERGIES:   Allergies  Allergen Reactions  . Atorvastatin Other (See Comments)    Arm pain    VITALS:  Blood pressure 117/57, pulse 70, temperature 98.6 F (37 C), temperature source Oral, resp. rate 20, height 6\' 1"  (1.854 m), weight 107.502 kg (237 lb), SpO2 95 %.  PHYSICAL EXAMINATION:  GENERAL:  81 y.o.-year-old patient lying in the bed with no acute distress.  EYES: Pupils equal, round, reactive to light and accommodation. No scleral icterus. Extraocular muscles intact.  HEENT: Head atraumatic, normocephalic. Oropharynx and nasopharynx clear.  NECK:  Supple, no jugular venous distention. No thyroid enlargement, no tenderness.  LUNGS: Normal breath sounds bilaterally, no wheezing, rales,rhonchi or crepitation. No use of accessory muscles of respiration.  CARDIOVASCULAR: S1, S2 normal. No murmurs, rubs, or gallops.  ABDOMEN: Soft, nontender, nondistended. Bowel sounds present. No organomegaly or mass.  EXTREMITIES: No pedal edema, cyanosis, or clubbing. Right ankle in dressing. NEUROLOGIC: Cranial nerves II  through XII are intact. Muscle strength 5/5 in all extremities except the right lower extremity. Sensation intact. Gait not checked.  PSYCHIATRIC: The patient is alert and oriented x 2. SKIN: No obvious rash, lesion, or ulcer.    LABORATORY PANEL:   CBC  Recent Labs Lab 01/09/15 0846  WBC 6.8  HGB 10.8*  HCT 33.4*  PLT 114*   ------------------------------------------------------------------------------------------------------------------  Chemistries   Recent Labs Lab 01/07/15 1955 01/09/15 0846  NA 142 137  K 4.3 5.2*  CL 110 106  CO2 25 26  GLUCOSE 100* 93  BUN 29* 27*  CREATININE 2.33* 2.41*  CALCIUM 8.7* 8.5*  AST 16  --   ALT 13*  --   ALKPHOS 54  --   BILITOT 0.6  --    ------------------------------------------------------------------------------------------------------------------  Cardiac Enzymes  Recent Labs Lab 01/08/15 1605  TROPONINI <0.03   ------------------------------------------------------------------------------------------------------------------  RADIOLOGY:  Dg Ankle 2 Views Left  01/07/2015   CLINICAL DATA:  Status post fall today with medial and lateral malleolar fractures. Postreduction films. Initial encounter.  EXAM: LEFT ANKLE - 2 VIEW  COMPARISON:  Plain films earlier today.  FINDINGS: Marked lateral and posterior subluxation of the talus at the tibiotalar joint is unchanged. Medial and lateral malleolar fractures are again seen without change in position or alignment. The patient is in a fiberglass splint.  IMPRESSION: No marked change in position or alignment of medial and lateral malleolar fractures and medial and posterior subluxation of the talus at the tibiotalar joint.   Electronically Signed   By: Inge Rise M.D.   On: 01/07/2015 16:12   Dg Ankle Complete Left  01/07/2015   CLINICAL DATA:  [place] today and  injured left ankle.  EXAM: LEFT ANKLE COMPLETE - 3+ VIEW  COMPARISON:  None.  FINDINGS: Bimalleolar ankle fractures  are demonstrated. There is a displaced transverse fracture through the medial malleolus at the level of the ankle mortise. The talus is subluxed laterally approximately 1 cm. There is also a long oblique displaced fracture of the distal fibular shaft above the ankle mortise. No talus fracture is identified. The subtalar joints are maintained.  IMPRESSION: Displaced bimalleolar ankle fractures with moderate lateral subluxation of the talus in relation to the tibia.   Electronically Signed   By: Marijo Sanes M.D.   On: 01/07/2015 14:03    EKG:   Orders placed or performed during the hospital encounter of 01/07/15  . EKG 12-Lead  . EKG 12-Lead    ASSESSMENT AND PLAN:   1. Left ankle fracture, closed. S/p ORIF. Pain control, PT and continue Eliquis.  2. COPD, stable. Continue Mucinex as well as duo nebs as above 3. Coronary artery disease, stable. Continue aspirin, Zocor. 4. Atrial fibrillation, chronic. Rate controlled, continue Eliquis,hold Lopressor and the Cardizem due to low blood pressure. 5. CKD stage IV. Stable.  *Hyperkalemia, mild, continue IV fluid support and follow-up BMP. * Chest pain with left shoulder pain. Due to muscle skeletal etiology. Improved.  All the records are reviewed and case discussed with Care Management/Social Workerr. Management plans discussed with the patient, his wife and daughter and they are in agreement.  CODE STATUS: Full code  TOTAL TIME TAKING CARE OF THIS PATIENT: 35  minutes.   POSSIBLE D/C IN 2 DAYS, DEPENDING ON CLINICAL CONDITION.   Demetrios Loll M.D on 01/09/2015 at 1:22 PM  Between 7am to 6pm - Pager - [phone]  After 6pm go to [email or web address] - password [email or web address] [facility]  [facility]  Office  [phone]  CC: Primary care physician; [place] Pink, MD

## 2015-01-09 NOTE — Evaluation (Signed)
Physical Therapy Evaluation Patient Details Name: [name] MRN: [id number] DOB: April 13, 1933 Today's Date: 01/09/2015   History of Present Illness  Pt fell on step and broke L ankle needing ORIF  Clinical Impression  Pt pleasant and showing good effort t/o PT but is very limited with his "ambulation" and standing tolerance. He is able to maintain NWBing on L but fatigues very quickly and is unable to really do any hopping instead shifting heel-toe.  Pt does show good general LE strength as well as functional UE strength that allowed him to at least to get to the recliner.     Follow Up Recommendations SNF    Equipment Recommendations       Recommendations for Other Services       Precautions / Restrictions Precautions Precautions: Fall Restrictions Weight Bearing Restrictions: Yes LLE Weight Bearing: Non weight bearing      Mobility  Bed Mobility Overal bed mobility: Needs Assistance Bed Mobility: Supine to Sit     Supine to sit: Min assist     General bed mobility comments: Pt with definite use of rails, needs minimal assist with getting torso fully to upright  Transfers Overall transfer level: Needs assistance Equipment used: Rolling walker (2 wheeled) Transfers: Sit to/from Stand Sit to Stand: Min assist         General transfer comment: Pt needs cuing to insure that he does not put weight through the L foot, does have some unsteadiness on inital standing, but once up is able to maintain balance with walker fairly well.   Ambulation/Gait Ambulation/Gait assistance: Mod assist Ambulation Distance (Feet): 2 Feet Assistive device: Rolling walker (2 wheeled)       General Gait Details: Pt shows good effort but is very limited and fatigues quickly with just a simple pivot from bed to recliner.  He is able to keep weight off the L foot, but is unable to hop only shifting heel to get around to chair.  Pt's O2 drops to mid 80s with the effort (from mid 90s in  sitting)  Stairs            Wheelchair Mobility    Modified Rankin (Stroke Patients Only)       Balance Overall balance assessment: Needs assistance                                           Pertinent Vitals/Pain Pain Assessment: 0-10 Pain Score: 3     Home Living Family/patient expects to be discharged to:: Skilled nursing facility Living Arrangements: Spouse/significant other                    Prior Function Level of Independence: Independent with assistive device(s) (pt uses cane most of the time)         Comments: pt able to be out of the house, run errands, etc     Hand Dominance        Extremity/Trunk Assessment   Upper Extremity Assessment: Overall WFL for tasks assessed           Lower Extremity Assessment:  (R LE WFL, L hip WFL, knee and ankle limited 2/2 pain)         Communication   Communication: No difficulties  Cognition Arousal/Alertness: Awake/alert Behavior During Therapy: WFL for tasks assessed/performed  General Comments      Exercises General Exercises - Lower Extremity Ankle Circles/Pumps: AROM;10 reps (toe wiggle on L) Heel Slides: 5 reps;Strengthening;Right Hip ABduction/ADduction: 5 reps;AROM;Strengthening Straight Leg Raises: AROM;10 reps      Assessment/Plan    PT Assessment Patient needs continued PT services  PT Diagnosis Difficulty walking;Generalized weakness   PT Problem List Decreased safety awareness;Decreased strength;Decreased range of motion;Decreased activity tolerance;Decreased balance;Decreased mobility  PT Treatment Interventions Gait training;DME instruction;Functional mobility training;Therapeutic activities;Therapeutic exercise;Balance training;Neuromuscular re-education;Stair training   PT Goals (Current goals can be found in the Care Plan section) Acute Rehab PT Goals Patient Stated Goal: "I'd like to go home, but I know I can't  yet" PT Goal Formulation: With patient/family Time For Goal Achievement: 01/23/15 Potential to Achieve Goals: Fair    Frequency BID   Barriers to discharge        Co-evaluation               End of Session Equipment Utilized During Treatment: Gait belt Activity Tolerance: Patient limited by fatigue Patient left: with chair alarm set           Time: 6270-3500 PT Time Calculation (min) (ACUTE ONLY): 28 min   Charges:   PT Evaluation $Initial PT Evaluation Tier I: 1 Procedure PT Treatments $Therapeutic Exercise: 8-22 mins   PT G Codes:       [name], PT, DPT [phone]  [name] 01/09/2015, 11:22 AM

## 2015-01-09 NOTE — Clinical Social Work Placement (Signed)
   CLINICAL SOCIAL WORK PLACEMENT  NOTE  Date:  01/09/2015  Patient Details  Name: [name] MRN: [id number] Date of Birth: 05-Jun-1933  Clinical Social Work is seeking post-discharge placement for this patient at the [place] level of care (*CSW will initial, date and re-position this form in  chart as items are completed):  Yes   Patient/family provided with [place] Work Department's list of facilities offering this level of care within the geographic area requested by the patient (or if unable, by the patient's family).  Yes   Patient/family informed of their freedom to choose among providers that offer the needed level of care, that participate in Medicare, Medicaid or managed care program needed by the patient, have an available bed and are willing to accept the patient.  Yes   Patient/family informed of [facility]'s ownership interest in [facility] and [facility], as well as of the fact that they are under no obligation to receive care at these facilities.  PASRR submitted to EDS on       PASRR number received on       Existing PASRR number confirmed on 01/09/15     FL2 transmitted to all facilities in geographic area requested by pt/family on 01/09/15     FL2 transmitted to all facilities within larger geographic area on       Patient informed that his/her managed care company has contracts with or will negotiate with certain facilities, including the following:            Patient/family informed of bed offers received.  Patient chooses bed at       Physician recommends and patient chooses bed at      Patient to be transferred to   on  .  Patient to be transferred to facility by       Patient family notified on   of transfer.  Name of family member notified:        PHYSICIAN Please sign FL2     Additional Comment:    _______________________________________________ Loralyn Freshwater, LCSW 01/09/2015, 8:54  AM

## 2015-01-09 NOTE — Care Management Important Message (Signed)
Important Message  Patient Details  Name: [name] MRN: [id number] Date of Birth: 1933/05/22   Medicare Important Message Given:  Yes-second notification given    Juliann Pulse A Allmond 01/09/2015, 1:19 PM

## 2015-01-09 NOTE — Progress Notes (Signed)
   01/09/15 0950  Clinical Encounter Type  Visited With Patient  Visit Type Initial  Consult/Referral To Chaplain  Stress Factors  Patient Stress Factors None identified  Chaplain rounded in unit and offered pastoral care.   Chaplain Brianna Headen [phone]

## 2015-01-09 NOTE — Discharge Summary (Signed)
Physician Discharge Summary  Patient ID: [name] MRN: [id number] DOB/AGE: 1934-03-27 81 y.o.  Admit date: 01/07/2015 Discharge date: 01/09/2015  Admission Diagnoses: Bimalleolar fracture dislocation left ankle   Discharge Diagnoses: Bimalleolar fracture dislocation left ankle   Active Problems:   Bimalleolar fracture of left ankle   Discharged Condition: good  Hospital the patient was admitted for surgery on the left ankle. He was cleared by the medical service. However he developed nausea and vomiting and some chest discomfort overnight and surgery was delayed 01/08/2015 for this reason.  Seen by the medical service and had a cardiology consult which cleared him for surgery. This was done later on 50/27/7412 without complication. He had minimal pain following surgery. Blood pressure was followed closely. Dressings were changed and a well-padded short leg cast applied prior to discharge. Wounds were clean and dry.  Consults: Dr. Bridgett Larsson hospitalist and Dr. Fletcher Anon cardiology  Significant Diagnostic Studies: radiology: X-Ray: Ankle x-rays  Treatments: IV hydration, antibiotics: Ancef and Cleocin, anticoagulation: Eliquis and surgery: Open reduction internal fixation left ankle   Discharge Exam: Blood pressure 117/57, pulse 70, temperature 98.6 F (37 C), temperature source Oral, resp. rate 20, height 6\' 1"  (1.854 m), weight 107.502 kg (237 lb), SpO2 95 %. Extremities: no edema, redness or tenderness in the calves or thighs and Neurovascular status good left leg. Wound benign.  Disposition:  [facility] skilled nursing facility     Medication List    ASK your doctor about these medications        ALEVE PO  Take 2 tablets by mouth at bedtime as needed (for pain).     apixaban 2.5 MG Tabs tablet  Commonly known as:  ELIQUIS  Take 1 tablet (2.5 mg total) by mouth 2 (two) times daily.     diltiazem 120 MG 24 hr capsule  Commonly known as:  CARDIZEM CD  Take 1 capsule (120 mg  total) by mouth daily.     donepezil 5 MG tablet  Commonly known as:  ARICEPT  Take 5 mg by mouth at bedtime.     FLOMAX 0.4 MG Caps capsule  Generic drug:  tamsulosin  Take 0.4 mg by mouth daily after supper.     guaiFENesin 600 MG 12 hr tablet  Commonly known as:  MUCINEX  Take 1 tablet (600 mg total) by mouth 2 (two) times daily.     ipratropium-albuterol 0.5-2.5 (3) MG/3ML Soln  Commonly known as:  DUONEB  Take 3 mLs by nebulization every 4 (four) hours as needed (for wheezing).     metoprolol succinate 100 MG 24 hr tablet  Commonly known as:  TOPROL-XL  Take 1 tablet (100 mg total) by mouth daily.     omeprazole 20 MG capsule  Commonly known as:  PRILOSEC  Take 20 mg by mouth daily.     simvastatin 20 MG tablet  Commonly known as:  ZOCOR  Take 1 tablet (20 mg total) by mouth at bedtime.     torsemide 20 MG tablet  Commonly known as:  DEMADEX  Take 20 mg by mouth daily as needed (for weight gain).     traZODone 50 MG tablet  Commonly known as:  DESYREL  Take 1 tablet (50 mg total) by mouth at bedtime.         Signed: Park Breed 01/09/2015, 1:51 PM

## 2015-01-09 NOTE — Progress Notes (Signed)
Subjective: 1 Day Post-Op Procedure(s) (LRB): OPEN REDUCTION INTERNAL FIXATION (ORIF) ANKLE FRACTURE (Left)    Patient reports pain as mild. OOB in chair.  Alert and oriented. Slept well  Objective:   VITALS:   Filed Vitals:   01/09/15 1249  BP: 117/57  Pulse:   Temp:   Resp:     Neurovascular intact Sensation intact distally Intact pulses distally Dorsiflexion/Plantar flexion intact Some drainage on bandages.  Moves toes well.    LABS  Recent Labs  01/07/15 1955 01/09/15 0846  HGB 11.8* 10.8*  HCT 35.8* 33.4*  WBC 9.2 6.8  PLT 150 114*     Recent Labs  01/07/15 1955 01/09/15 0846  NA 142 137  K 4.3 5.2*  BUN 29* 27*  CREATININE 2.33* 2.41*  GLUCOSE 100* 93     Recent Labs  01/07/15 1955  INR 1.25     Assessment/Plan: 1 Day Post-Op Procedure(s) (LRB): OPEN REDUCTION INTERNAL FIXATION (ORIF) ANKLE FRACTURE (Left)   Advance diet Up with therapy D/C IV fluids Discharge to SNF tomorrow.

## 2015-01-09 NOTE — Progress Notes (Signed)
Physical Therapy Treatment Patient Details Name: [name] MRN: [id number] DOB: 02/20/1934 Today's Date: 01/09/2015    History of Present Illness Pt fell on step and broke L ankle needing ORIF    PT Comments    Pt shows very good attitude and effort with PT but fatigues very quickly with ambulation.  He does well maintaining NWBing initially, but with UE fatigue he does begin to need toe-touch to keep himself upright returning to bed.  Pt with good LE strength and shows good effort with exercises.   Follow Up Recommendations  SNF     Equipment Recommendations       Recommendations for Other Services       Precautions / Restrictions Precautions Precautions: Fall Restrictions LLE Weight Bearing: Non weight bearing    Mobility  Bed Mobility Overal bed mobility: Modified Independent Bed Mobility: Supine to Sit;Sit to Supine     Supine to sit: Min guard Sit to supine: Min assist   General bed mobility comments: PT shows good control with LEs getting in/out of bed.  He does well scooting up in bed but does need some assist  Transfers Overall transfer level: Needs assistance Equipment used: Rolling walker (2 wheeled) Transfers: Sit to/from Stand Sit to Stand: Min assist         General transfer comment: Pt generally does well with rising to standing.  He shows good effort and is able to support himself with UEs.  After ambulation he "crashes" back down onto bed unexpectedly secondary to fatigue.   Ambulation/Gait Ambulation/Gait assistance: Min assist Ambulation Distance (Feet): 15 Feet Assistive device: Rolling walker (2 wheeled)       General Gait Details: Pt takes his initiall few steps fairly well but begins to fatigue quickly and struggles to make it back to bed.  He physically does well, but is unsafe secondary to fatigu with limited ambulation.    Stairs            Wheelchair Mobility    Modified Rankin (Stroke Patients Only)        Balance                                    Cognition Arousal/Alertness: Awake/alert Behavior During Therapy: WFL for tasks assessed/performed Overall Cognitive Status: Within Functional Limits for tasks assessed                      Exercises General Exercises - Lower Extremity Ankle Circles/Pumps: 10 reps (resisted DF) Short Arc Quad: Strengthening;10 reps Heel Slides: 10 reps Hip ABduction/ADduction: 10 reps;AROM Straight Leg Raises: AROM;10 reps Hip Flexion/Marching: Strengthening;10 reps    General Comments        Pertinent Vitals/Pain Pain Score: 4     Home Living                      Prior Function            PT Goals (current goals can now be found in the care plan section) Progress towards PT goals: Progressing toward goals    Frequency  BID    PT Plan Current plan remains appropriate    Co-evaluation             End of Session Equipment Utilized During Treatment: Gait belt Activity Tolerance: Patient limited by fatigue Patient left: with bed alarm set  Time: 4742-5956 PT Time Calculation (min) (ACUTE ONLY): 23 min  Charges:  $Gait Training: 8-22 mins $Therapeutic Exercise: 8-22 mins                    G Codes:     [name], PT, DPT [phone]  [name] 01/09/2015, 4:43 PM

## 2015-01-09 NOTE — Progress Notes (Signed)
Clinical Education officer, museum (CSW) presented bed offers to patient and his wife. They chose [facility] Kim admissions coordinator at [facility] is aware of accepted offer. CSW also made [place] case manager aware of above. CSW will continue to follow and assist as needed.   Blima Rich, [place] [phone]

## 2015-01-09 NOTE — Care Management Note (Signed)
Case Management Note  Patient Details  Name: [name] MRN: [id number] Date of Birth: 03-Jun-1933  Subjective/Objective:                 Patient admitted with ankle fracture.  Status post OPEN REDUCTION INTERNAL FIXATION (ORIF) ANKLE FRACTURE.  Patient is from home with is wife.  Patient is to discharge to [facility] rehab.  CSW following   Action/Plan:   Expected Discharge Date:                  Expected Discharge Plan:     In-House Referral:     Discharge planning Services     Post Acute Care Choice:    Choice offered to:     DME Arranged:    DME Agency:     HH Arranged:    [place] Agency:     Status of Service:     Medicare Important Message Given:  Yes-second notification given Date Medicare IM Given:    Medicare IM give by:    Date Additional Medicare IM Given:    Additional Medicare Important Message give by:     If discussed at [place] of Stay Meetings, dates discussed:    Additional Comments:  Beverly Sessions, RN 01/09/2015, 2:30 PM

## 2015-01-09 NOTE — Progress Notes (Signed)
Spoke with Dr.Chen regarding pt's home blood pressure med's not restarted post surgery and elevated K+ 5.2. Med's on hold due to low blood pressure per MD. Will continue to monitor the patient.

## 2015-01-10 ENCOUNTER — Encounter
Admission: RE | Admit: 2015-01-10 | Discharge: 2015-01-10 | Disposition: A | Discharge status: Home / Self Care | Payer: Commercial Managed Care - HMO | Source: Ambulatory Visit | Attending: Internal Medicine | Admitting: Internal Medicine

## 2015-01-10 DIAGNOSIS — R079 Chest pain, unspecified: Secondary | ICD-10-CM | POA: Diagnosis not present

## 2015-01-10 DIAGNOSIS — I4891 Unspecified atrial fibrillation: Secondary | ICD-10-CM | POA: Diagnosis not present

## 2015-01-10 DIAGNOSIS — I5032 Chronic diastolic (congestive) heart failure: Secondary | ICD-10-CM | POA: Diagnosis not present

## 2015-01-10 DIAGNOSIS — S82842D Displaced bimalleolar fracture of left lower leg, subsequent encounter for closed fracture with routine healing: Secondary | ICD-10-CM | POA: Diagnosis not present

## 2015-01-10 DIAGNOSIS — I482 Chronic atrial fibrillation: Secondary | ICD-10-CM | POA: Diagnosis not present

## 2015-01-10 DIAGNOSIS — I480 Paroxysmal atrial fibrillation: Secondary | ICD-10-CM | POA: Diagnosis not present

## 2015-01-10 DIAGNOSIS — R58 Hemorrhage, not elsewhere classified: Secondary | ICD-10-CM | POA: Diagnosis not present

## 2015-01-10 DIAGNOSIS — I129 Hypertensive chronic kidney disease with stage 1 through stage 4 chronic kidney disease, or unspecified chronic kidney disease: Secondary | ICD-10-CM | POA: Diagnosis not present

## 2015-01-10 DIAGNOSIS — S82892A Other fracture of left lower leg, initial encounter for closed fracture: Secondary | ICD-10-CM | POA: Diagnosis not present

## 2015-01-10 DIAGNOSIS — R2689 Other abnormalities of gait and mobility: Secondary | ICD-10-CM | POA: Diagnosis not present

## 2015-01-10 DIAGNOSIS — Y838 Other surgical procedures as the cause of abnormal reaction of the patient, or of later complication, without mention of misadventure at the time of the procedure: Secondary | ICD-10-CM | POA: Diagnosis not present

## 2015-01-10 DIAGNOSIS — J449 Chronic obstructive pulmonary disease, unspecified: Secondary | ICD-10-CM | POA: Diagnosis not present

## 2015-01-10 DIAGNOSIS — T148 Other injury of unspecified body region: Secondary | ICD-10-CM | POA: Diagnosis not present

## 2015-01-10 DIAGNOSIS — Z7901 Long term (current) use of anticoagulants: Secondary | ICD-10-CM | POA: Diagnosis not present

## 2015-01-10 DIAGNOSIS — Z87891 Personal history of nicotine dependence: Secondary | ICD-10-CM | POA: Diagnosis not present

## 2015-01-10 DIAGNOSIS — I739 Peripheral vascular disease, unspecified: Secondary | ICD-10-CM | POA: Diagnosis not present

## 2015-01-10 DIAGNOSIS — M720 Palmar fascial fibromatosis [Dupuytren]: Secondary | ICD-10-CM | POA: Diagnosis not present

## 2015-01-10 DIAGNOSIS — M6281 Muscle weakness (generalized): Secondary | ICD-10-CM | POA: Diagnosis not present

## 2015-01-10 DIAGNOSIS — S82842A Displaced bimalleolar fracture of left lower leg, initial encounter for closed fracture: Secondary | ICD-10-CM | POA: Diagnosis not present

## 2015-01-10 DIAGNOSIS — I509 Heart failure, unspecified: Secondary | ICD-10-CM | POA: Diagnosis not present

## 2015-01-10 DIAGNOSIS — N183 Chronic kidney disease, stage 3 (moderate): Secondary | ICD-10-CM | POA: Diagnosis not present

## 2015-01-10 DIAGNOSIS — I2510 Atherosclerotic heart disease of native coronary artery without angina pectoris: Secondary | ICD-10-CM | POA: Diagnosis not present

## 2015-01-10 DIAGNOSIS — R2681 Unsteadiness on feet: Secondary | ICD-10-CM | POA: Diagnosis not present

## 2015-01-10 DIAGNOSIS — M96830 Postprocedural hemorrhage and hematoma of a musculoskeletal structure following a musculoskeletal system procedure: Secondary | ICD-10-CM | POA: Diagnosis not present

## 2015-01-10 DIAGNOSIS — Z79899 Other long term (current) drug therapy: Secondary | ICD-10-CM | POA: Diagnosis not present

## 2015-01-10 DIAGNOSIS — I130 Hypertensive heart and chronic kidney disease with heart failure and stage 1 through stage 4 chronic kidney disease, or unspecified chronic kidney disease: Secondary | ICD-10-CM | POA: Diagnosis not present

## 2015-01-10 DIAGNOSIS — M810 Age-related osteoporosis without current pathological fracture: Secondary | ICD-10-CM | POA: Diagnosis not present

## 2015-01-10 LAB — CBC
HCT: 33.5 % — ABNORMAL LOW (ref 40.0–52.0)
Hemoglobin: 11.1 g/dL — ABNORMAL LOW (ref 13.0–18.0)
MCH: 31.1 pg (ref 26.0–34.0)
MCHC: 33 g/dL (ref 32.0–36.0)
MCV: 94.1 fL (ref 80.0–100.0)
PLATELETS: 111 10*3/uL — AB (ref 150–440)
RBC: 3.56 MIL/uL — AB (ref 4.40–5.90)
RDW: 15.6 % — AB (ref 11.5–14.5)
WBC: 6.6 10*3/uL (ref 3.8–10.6)

## 2015-01-10 LAB — CREATININE, SERUM
Creatinine, Ser: 2.62 mg/dL — ABNORMAL HIGH (ref 0.61–1.24)
GFR, EST AFRICAN AMERICAN: 25 mL/min — AB (ref >60)
GFR, EST NON AFRICAN AMERICAN: 22 mL/min — AB (ref >60)

## 2015-01-10 LAB — POTASSIUM: Potassium: 4.6 mmol/L (ref 3.5–5.1)

## 2015-01-10 MED ORDER — HYDROCODONE-ACETAMINOPHEN 7.5-325 MG PO TABS: 1.0000 | ORAL_TABLET | ORAL | Status: DC | PRN

## 2015-01-10 MED ORDER — CARISOPRODOL 350 MG PO TABS: 350.0000 mg | ORAL_TABLET | Freq: Three times a day (TID) | ORAL | Status: DC

## 2015-01-10 MED ORDER — IPRATROPIUM-ALBUTEROL 0.5-2.5 (3) MG/3ML IN SOLN
3.0000 mL | RESPIRATORY_TRACT | Status: DC | PRN
  Administered 2015-01-10: 3 mL via RESPIRATORY_TRACT
  Filled 2015-01-10: qty 3

## 2015-01-10 MED ORDER — HYDROCODONE-ACETAMINOPHEN 7.5-325 MG PO TABS: 1.0000 | ORAL_TABLET | Freq: Four times a day (QID) | ORAL | Status: DC

## 2015-01-10 NOTE — Progress Notes (Signed)
Subjective: 2 Days Post-Op Procedure(s) (LRB): OPEN REDUCTION INTERNAL FIXATION (ORIF) ANKLE FRACTURE (Left)    Patient reports pain as mild. Has constipation and urinary incontinence.  Cast applied this morning.  May go to SNF today or tomorrow. Objective:   VITALS:   Filed Vitals:   01/10/15 1526  BP: 134/53  Pulse: 63  Temp: 97.6 F (36.4 C)  Resp: 19    Neurologically intact Sensation intact distally Intact pulses distally Dorsiflexion/Plantar flexion intact No cellulitis present  LABS  Recent Labs  01/07/15 1955 01/09/15 0846 01/10/15 0420  HGB 11.8* 10.8* 11.1*  HCT 35.8* 33.4* 33.5*  WBC 9.2 6.8 6.6  PLT 150 114* 111*     Recent Labs  01/07/15 1955 01/09/15 0846 01/10/15 0420  NA 142 137  --   K 4.3 5.2* 4.6  BUN 29* 27*  --   CREATININE 2.33* 2.41* 2.62*  GLUCOSE 100* 93  --      Recent Labs  01/07/15 1955  INR 1.25     Assessment/Plan: 2 Days Post-Op Procedure(s) (LRB): OPEN REDUCTION INTERNAL FIXATION (ORIF) ANKLE FRACTURE (Left)   Up with therapy Discharge to SNF when stable medically

## 2015-01-10 NOTE — Progress Notes (Signed)
[facility] NAME: [name]    MR#:  [id number]  DATE OF BIRTH:  Aug 04, 1933  SUBJECTIVE:  CHIEF COMPLAINT:   Chief Complaint  Patient presents with  . Fall  . Ankle Injury   complains of some sinus congestion as well as intermittent cough but no significant wheezing or shortness of breath. Not much ankle pain. Status post ORIF  REVIEW OF SYSTEMS:  CONSTITUTIONAL: No fever, fatigue or weakness.  EYES: No blurred or double vision.  EARS, NOSE, AND THROAT: No tinnitus or ear pain.  RESPIRATORY: No cough, shortness of breath, wheezing or hemoptysis.  CARDIOVASCULAR: Has chest pain, no orthopnea, edema.  GASTROINTESTINAL: No nausea, vomiting, diarrhea or abdominal pain.  GENITOURINARY: No dysuria, hematuria.  ENDOCRINE: No polyuria, nocturia,  HEMATOLOGY: No anemia, easy bruising or bleeding SKIN: No rash or lesion. MUSCULOSKELETAL: No joint pain. NEUROLOGIC: No tingling, numbness, weakness.  PSYCHIATRY: No anxiety or depression.   DRUG ALLERGIES:   Allergies  Allergen Reactions  . Atorvastatin Other (See Comments)    Arm pain    VITALS:  Blood pressure 148/70, pulse 62, temperature 98.5 F (36.9 C), temperature source Oral, resp. rate 18, height 6\' 1"  (1.854 m), weight 107.502 kg (237 lb), SpO2 99 %.  PHYSICAL EXAMINATION:  GENERAL:  81 y.o.-year-old patient lying in the bed with no acute distress.  EYES: Pupils equal, round, reactive to light and accommodation. No scleral icterus. Extraocular muscles intact.  HEENT: Head atraumatic, normocephalic. Oropharynx and nasopharynx clear.  NECK:  Supple, no jugular venous distention. No thyroid enlargement, no tenderness.  LUNGS: Normal breath sounds bilaterally, no wheezing, rales,rhonchi or crepitation. No use of accessory muscles of respiration.  CARDIOVASCULAR: S1, S2 normal. No murmurs, rubs, or gallops.  ABDOMEN: Soft, nontender, nondistended. Bowel sounds  present. No organomegaly or mass.  EXTREMITIES: No pedal edema, cyanosis, or clubbing. Right ankle in dressing. NEUROLOGIC: Cranial nerves II through XII are intact. Muscle strength 5/5 in all extremities except the right lower extremity, which is in cast. Toes are warm. Sensation intact. Gait not checked.  PSYCHIATRIC: The patient is alert and oriented x 2. SKIN: No obvious rash, lesion, or ulcer.    LABORATORY PANEL:   CBC  Recent Labs Lab 01/10/15 0420  WBC 6.6  HGB 11.1*  HCT 33.5*  PLT 111*   ------------------------------------------------------------------------------------------------------------------  Chemistries   Recent Labs Lab 01/07/15 1955 01/09/15 0846 01/10/15 0420  NA 142 137  --   K 4.3 5.2*  --   CL 110 106  --   CO2 25 26  --   GLUCOSE 100* 93  --   BUN 29* 27*  --   CREATININE 2.33* 2.41* 2.62*  CALCIUM 8.7* 8.5*  --   AST 16  --   --   ALT 13*  --   --   ALKPHOS 54  --   --   BILITOT 0.6  --   --    ------------------------------------------------------------------------------------------------------------------  Cardiac Enzymes  Recent Labs Lab 01/08/15 1605  TROPONINI <0.03   ------------------------------------------------------------------------------------------------------------------  RADIOLOGY:  No results found.  EKG:   Orders placed or performed during the hospital encounter of 01/07/15  . EKG 12-Lead  . EKG 12-Lead    ASSESSMENT AND PLAN:   1. Left ankle fracture, closed. S/p ORIF. Pain control, PT and continue Eliquis.  2. COPD, stable. Continue Mucinex as well as duo nebs as above 3. Coronary artery disease, stable. Continue aspirin, Zocor. 4. Atrial  fibrillation, chronic. Rate controlled, continue Eliquis,hold Lopressor and the Cardizem due to low blood pressure. 5. CKD stage IV. Stable.  6. Hyperkalemia, mild, continue IV fluid support and follow-up BMP. Get level status today 7. Chest pain with left  shoulder pain. Due to muscle skeletal etiology. Improved.  All the records are reviewed and case discussed with Care Management/Social Workerr. Management plans discussed with the patient, his wife and daughter and they are in agreement.  CODE STATUS: Full code  TOTAL TIME TAKING CARE OF THIS PATIENT: 35  minutes.   POSSIBLE D/C IN 2 DAYS, DEPENDING ON CLINICAL CONDITION.   Theodoro Grist M.D on 01/10/2015 at 11:55 AM  Between 7am to 6pm - Pager - [phone]  After 6pm go to [email or web address] - password [email or web address] [facility]  [facility]  Office  [phone]  CC: Primary care physician; [place] Pink, MD

## 2015-01-10 NOTE — Progress Notes (Addendum)
Pt is being discharge  To nursing home [facility] called in report, pt is alert and orient x 3 able to make needs known. Skin warm and dry. Cast appled.

## 2015-01-10 NOTE — Progress Notes (Signed)
Physical Therapy Treatment Patient Details Name: [name] MRN: [id number] DOB: 12-Sep-1933 Today's Date: 01/10/2015    History of Present Illness Pt fell on step and broke L ankle needing ORIF    PT Comments    Pt again eager to work with PT and shows great effort despite his UEs quickly fatiguing with ambulation.  He does struggle to fully maintain NWBing during 25 ft of ambulation, but did show great effort and appeared to keep the [place] to a minimum.  He has very good strength in b/l LEs and does well with bed mobility.    Follow Up Recommendations  SNF     Equipment Recommendations       Recommendations for Other Services       Precautions / Restrictions Precautions Precautions: Fall Restrictions LLE Weight Bearing: Non weight bearing    Mobility  Bed Mobility Overal bed mobility: Modified Independent Bed Mobility: Supine to Sit     Supine to sit: Min guard     General bed mobility comments: Pt reports some increased pressure with gravity dependent position but is able to get to EOB w/o direct assist  Transfers Overall transfer level: Needs assistance Equipment used: Rolling walker (2 wheeled) Transfers: Sit to/from Stand Sit to Stand: Min assist         General transfer comment: Pt continues to need very heavy UE assist to get to standing and some light physical assist to shift hips forward and maintain appropiate positioning/WBing  Ambulation/Gait Ambulation/Gait assistance: Min assist Ambulation Distance (Feet): 25 Feet Assistive device: Rolling walker (2 wheeled)       General Gait Details: Pt able to inconsistently fully keep weight off the L LE, he does regularly put TTWBing or very light heel down WBing despite much cuing.  His UEs again fatigue with the longer upright walking session and generally he does become very fatigued (O2 drops from 95% on 2 liters to 88% on 3 liters during ambulation).  Pt shows great effort however and with chair  following he was able to push himself to go farther today.   Stairs            Wheelchair Mobility    Modified Rankin (Stroke Patients Only)       Balance                                    Cognition Arousal/Alertness: Awake/alert Behavior During Therapy: WFL for tasks assessed/performed                        Exercises General Exercises - Lower Extremity Ankle Circles/Pumps: 10 reps Quad Sets: Strengthening;10 reps Short Arc Quad: Strengthening;10 reps Heel Slides: 10 reps Hip ABduction/ADduction: 10 reps;AROM Straight Leg Raises: AROM;10 reps Hip Flexion/Marching: Strengthening;10 reps    General Comments        Pertinent Vitals/Pain Pain Score: 2     Home Living                      Prior Function            PT Goals (current goals can now be found in the care plan section) Progress towards PT goals: Progressing toward goals    Frequency  BID    PT Plan Current plan remains appropriate    Co-evaluation  End of Session Equipment Utilized During Treatment: Gait belt Activity Tolerance: Patient limited by fatigue Patient left: with chair alarm set     Time: 1007-1035 PT Time Calculation (min) (ACUTE ONLY): 28 min  Charges:  $Gait Training: 8-22 mins $Therapeutic Exercise: 8-22 mins                    G Codes:     [name], PT, DPT [phone]  [name] 01/10/2015, 12:31 PM

## 2015-01-10 NOTE — Plan of Care (Signed)
Problem: Phase II Progression Outcomes Goal: Tolerating diet Outcome: Completed/Met Date Met:  01/10/15 Tolerating diet without difficulty. Goal: Discharge plan established Outcome: Completed/Met Date Met:  01/10/15 Pt to plan on going to skilled nursing facility at discharge.

## 2015-01-10 NOTE — Progress Notes (Signed)
Spoke with DR. Willis regarding pt wheezing and coughing MD to place orders.

## 2015-01-11 ENCOUNTER — Encounter: Payer: Self-pay | Admitting: Intensive Care

## 2015-01-11 ENCOUNTER — Emergency Department
Admission: EM | Admit: 2015-01-11 | Discharge: 2015-01-11 | Disposition: A | Discharge status: Home / Self Care | Payer: Commercial Managed Care - HMO | Attending: Emergency Medicine | Admitting: Emergency Medicine

## 2015-01-11 DIAGNOSIS — I129 Hypertensive chronic kidney disease with stage 1 through stage 4 chronic kidney disease, or unspecified chronic kidney disease: Secondary | ICD-10-CM | POA: Insufficient documentation

## 2015-01-11 DIAGNOSIS — Z7901 Long term (current) use of anticoagulants: Secondary | ICD-10-CM | POA: Diagnosis not present

## 2015-01-11 DIAGNOSIS — Z87891 Personal history of nicotine dependence: Secondary | ICD-10-CM | POA: Insufficient documentation

## 2015-01-11 DIAGNOSIS — N183 Chronic kidney disease, stage 3 (moderate): Secondary | ICD-10-CM | POA: Insufficient documentation

## 2015-01-11 DIAGNOSIS — T148XXA Other injury of unspecified body region, initial encounter: Secondary | ICD-10-CM

## 2015-01-11 DIAGNOSIS — I482 Chronic atrial fibrillation: Secondary | ICD-10-CM | POA: Insufficient documentation

## 2015-01-11 DIAGNOSIS — Z79899 Other long term (current) drug therapy: Secondary | ICD-10-CM | POA: Insufficient documentation

## 2015-01-11 DIAGNOSIS — M96830 Postprocedural hemorrhage and hematoma of a musculoskeletal structure following a musculoskeletal system procedure: Secondary | ICD-10-CM | POA: Diagnosis not present

## 2015-01-11 DIAGNOSIS — Y838 Other surgical procedures as the cause of abnormal reaction of the patient, or of later complication, without mention of misadventure at the time of the procedure: Secondary | ICD-10-CM | POA: Insufficient documentation

## 2015-01-11 DIAGNOSIS — R58 Hemorrhage, not elsewhere classified: Secondary | ICD-10-CM | POA: Diagnosis not present

## 2015-01-11 LAB — CBC WITH DIFFERENTIAL/PLATELET
BASOS ABS: 0.1 10*3/uL (ref 0–0.1)
Basophils Relative: 1 %
EOS PCT: 4 %
Eosinophils Absolute: 0.4 10*3/uL (ref 0–0.7)
HEMATOCRIT: 37.8 % — AB (ref 40.0–52.0)
HEMOGLOBIN: 12.2 g/dL — AB (ref 13.0–18.0)
LYMPHS ABS: 0.8 10*3/uL — AB (ref 1.0–3.6)
LYMPHS PCT: 9 %
MCH: 30.3 pg (ref 26.0–34.0)
MCHC: 32.3 g/dL (ref 32.0–36.0)
MCV: 93.8 fL (ref 80.0–100.0)
Monocytes Absolute: 1 10*3/uL (ref 0.2–1.0)
Monocytes Relative: 11 %
NEUTROS ABS: 6.6 10*3/uL — AB (ref 1.4–6.5)
Neutrophils Relative %: 75 %
Platelets: 134 10*3/uL — ABNORMAL LOW (ref 150–440)
RBC: 4.03 MIL/uL — AB (ref 4.40–5.90)
RDW: 15.1 % — ABNORMAL HIGH (ref 11.5–14.5)
WBC: 8.8 10*3/uL (ref 3.8–10.6)

## 2015-01-11 LAB — PROTIME-INR
INR: 1.16
Prothrombin Time: 15 seconds (ref 11.4–15.0)

## 2015-01-11 NOTE — ED Provider Notes (Signed)
[facility] Emergency Department Provider Note  ____________________________________________   I have reviewed the triage vital signs and the nursing notes.   HISTORY  Chief Complaint Leg Injury    HPI [name] is a 81 y.o. male who is on for [facility] according to his MAR for A. fib. The patient had an ORIF of a ankle fracture on the left 2 days ago. He was brought in by ambulance from the home where he resides because there was some drainage of blood through the heel of the cast. No significant bleeding. Just a slight ooze. Patient has no other complaints. He states he has no pain to the area and no fever not lightheaded no racing heart etc.     Past Medical History  Diagnosis Date  . Chronic atrial fibrillation (HCC)     a. on pradaxa  . Coronary artery disease     a. s/p MI x 3;  b. s/p CABG x2 in 1999;  c. Cath 2007: LIMA->LAD patent, VG->OM 100, PCI/DES of RPL/mid RCA (cypher DES).  . Chronic obstructive pulmonary disease ([place])   . GERD (gastroesophageal reflux disease)   . Hyperlipidemia   . Hypertension   . PVD (peripheral vascular disease) ([place])   . AAA (abdominal aortic aneurysm) ([place])     a. s/p repair - 1999 @ [facility] @ time of CABG.  . Chronic diastolic CHF (congestive heart failure) ([place])     a. 01/2012 Echo:  EF 55%.  . Tachy-brady syndrome ([place])     a. s/p PPM 2010.  [name] CKD (chronic kidney disease), stage III     a. Acute on chronic 03/2012  . Cancer ([place])     skin cancer nose  . Sleep apnea   . Broken ankle     Patient Active Problem List   Diagnosis Date Noted  . Bimalleolar fracture of left ankle 01/07/2015  . COPD exacerbation ([place]) 08/22/2014  . Coronary artery disease   . Chronic atrial fibrillation ([place])   . Chronic diastolic CHF (congestive heart failure) ([place])   . Tachy-brady syndrome ([place])   . CKD (chronic kidney disease), stage III 04/09/2012  . Chronic diastolic heart failure ([place]) 01/26/2012  . Chest pain  09/23/2010  . Hyperlipidemia 06/12/2009  . ESSENTIAL HYPERTENSION 06/12/2009  . CORONARY ATHEROSCLEROSIS OF ARTERY BYPASS GRAFT 06/12/2009  . ATRIAL FIBRILLATION 06/12/2009  . AAA 06/12/2009  . COPD 06/12/2009  . GERD 06/12/2009  . PACEMAKER, PERMANENT 06/12/2009    Past Surgical History  Procedure Laterality Date  . Coronary artery bypass graft    . Abdominal aortic aneurysm repair    . Insert / replace / remove pacemaker    . Foot surgery      left foot surgery to remove cyst.  . Trigger finger release    . Skin cancer biopsy      nose  . Ankle surgery Right   . Orif ankle fracture Left 01/08/2015    Procedure: OPEN REDUCTION INTERNAL FIXATION (ORIF) ANKLE FRACTURE;  Surgeon: Earnestine Leys, MD;  Location: [facility] ORS;  Service: Orthopedics;  Laterality: Left;    Current Outpatient Rx  Name  Route  Sig  Dispense  Refill  . dabigatran (PRADAXA) 75 MG CAPS capsule   Oral   Take 75 mg by mouth 2 (two) times daily.         [name] diltiazem (CARDIZEM CD) 120 MG 24 hr capsule   Oral   Take 1 capsule (120 mg total) by  mouth daily.   30 capsule   3   . donepezil (ARICEPT) 5 MG tablet   Oral   Take 5 mg by mouth at bedtime.         [name] guaiFENesin (MUCINEX) 600 MG 12 hr tablet   Oral   Take 1 tablet (600 mg total) by mouth 2 (two) times daily.   30 tablet   0   . ipratropium-albuterol (DUONEB) 0.5-2.5 (3) MG/3ML SOLN   Nebulization   Take 3 mLs by nebulization every 4 (four) hours as needed (for wheezing).   360 mL   0   . metoprolol succinate (TOPROL-XL) 100 MG 24 hr tablet   Oral   Take 1 tablet (100 mg total) by mouth daily.   30 tablet   3   . naproxen sodium (ANAPROX) 220 MG tablet   Oral   Take 440 mg by mouth at bedtime as needed (for pain.).         [name] omeprazole (PRILOSEC) 20 MG capsule   Oral   Take 20 mg by mouth daily.           . simvastatin (ZOCOR) 20 MG tablet   Oral   Take 1 tablet (20 mg total) by mouth at bedtime.   30 tablet   6   .  Tamsulosin HCl (FLOMAX) 0.4 MG CAPS   Oral   Take 0.4 mg by mouth daily after supper.         . torsemide (DEMADEX) 20 MG tablet   Oral   Take 20 mg by mouth daily as needed (for weight gain).          . traZODone (DESYREL) 50 MG tablet   Oral   Take 1 tablet (50 mg total) by mouth at bedtime. Patient taking differently: Take 25 mg by mouth at bedtime.    30 tablet   3   . apixaban (ELIQUIS) 2.5 MG TABS tablet   Oral   Take 1 tablet (2.5 mg total) by mouth 2 (two) times daily.   60 tablet   11   . carisoprodol (SOMA) 350 MG tablet   Oral   Take 1 tablet (350 mg total) by mouth 3 (three) times daily.   30 tablet   0   . HYDROcodone-acetaminophen (NORCO) 7.5-325 MG tablet   Oral   Take 1 tablet by mouth every 6 (six) hours.   30 tablet   0   . HYDROcodone-acetaminophen (NORCO) 7.5-325 MG tablet   Oral   Take 1-2 tablets by mouth every 4 (four) hours as needed (breakthrough pain).   30 tablet   0     Allergies Lipitor  Family History  Problem Relation Age of Onset  . Stomach cancer Mother     died @ 61  . Diabetes Mother   . Hypertension Mother   . Coronary artery disease Mother   . Cerebral aneurysm Father     died @ 45  . Diabetes Father   . Hypertension Father   . Coronary artery disease Father     Social History Social History  Substance Use Topics  . Smoking status: Former Smoker -- 1.00 packs/day for 20 years    Quit date: 04/11/1997  . Smokeless tobacco: Never Used  . Alcohol Use: No     Comment: Rare    Review of Systems Constitutional: No fever/chills Eyes: No visual changes. ENT: No sore throat. No stiff neck no neck pain Cardiovascular: Denies chest pain. Respiratory: Denies shortness  of breath. Gastrointestinal:   no vomiting.  No diarrhea.  No constipation. Genitourinary: Negative for dysuria. Musculoskeletal: Negative lower extremity swelling Skin: Negative for rash. Neurological: Negative for headaches, focal weakness or  numbness. 10-point ROS otherwise negative.  ____________________________________________   PHYSICAL EXAM:  VITAL SIGNS: ED Triage Vitals  Enc Vitals Group     BP 01/11/15 0814 172/96 mmHg     Pulse Rate 01/11/15 0814 85     Resp 01/11/15 0814 20     Temp 01/11/15 0814 97.5 F (36.4 C)     Temp Source 01/11/15 0814 Oral     SpO2 01/11/15 0814 95 %     Weight 01/11/15 0814 248 lb (112.492 kg)     Height 01/11/15 0814 6\' 1"  (1.854 m)     Head Cir --      Peak Flow --      Pain Score 01/11/15 0822 5     Pain Loc --      Pain Edu? --      Excl. in [place]? --     Constitutional: Alert and oriented. In no acute distress  Cardiovascular: Irregularly irregular Grossly normal heart sounds.  Good peripheral circulation. Respiratory: Normal respiratory effort.  No retractions. Lungs CTAB. Gastrointestinal: Soft and nontender. No distention. No guarding no rebound Back:  There is no focal tenderness or step off there is no midline tenderness there are no lesions noted. there is no CVA tenderness  Musculoskeletal: No lower extremity tenderness. No joint effusions, no DVT signs strong distal pulses no edema Neurologic:  Normal speech and language. No gross focal neurologic deficits are appreciated.  Skin:  Skin is warm, dry and intact. No rash noted. Psychiatric: Mood and affect are normal. Speech and behavior are normal.  ____________________________________________   LABS (all labs ordered are listed, but only abnormal results are displayed)  Labs Reviewed  CBC WITH DIFFERENTIAL/PLATELET - Abnormal; Notable for the following:    RBC 4.03 (*)    Hemoglobin 12.2 (*)    HCT 37.8 (*)    RDW 15.1 (*)    Platelets 134 (*)    Neutro Abs 6.6 (*)    Lymphs Abs 0.8 (*)    All other components within normal limits  PROTIME-INR  URINALYSIS COMPLETEWITH MICROSCOPIC ([facility] ONLY)    ____________________________________________  EKG   ____________________________________________  RADIOLOGY   ____________________________________________   PROCEDURES  Procedure(s) performed: None  Critical Care performed: None  ____________________________________________   INITIAL IMPRESSION / ASSESSMENT AND PLAN / ED COURSE  Pertinent labs & imaging results that were available during my care of the patient were reviewed by me and considered in my medical decision making (see chart for details).  CBC and vital signs are reassuring, there is a slight ooze from the bottom of the cast which has not appreciably changed since he has been here. There is no evidence of significant bleeding, his CBC etc. are reassuring, I talked to Dr. Sabra Heck does not want me to take off his cast for this. He does advise that we stop the anticoagulation medication for 2 days and they will see him first thing tomorrow in the morning. Bleeding precautions and return precautions given and understood. ____________________________________________   FINAL CLINICAL IMPRESSION(S) / ED DIAGNOSES  Final diagnoses:  None     Schuyler Amor, MD 01/11/15 [phone]

## 2015-01-11 NOTE — Discharge Instructions (Signed)
Do not take the pradaxa  for the next 2 days. See the orthopedic surgery first thing in the morning. It is located that there is some oozing from the site. If there seems to be considerable bleeding, including blood from the top of the cast, or if there is increased pain or fever return to the emergency room. Follow closely with your doctor

## 2015-01-11 NOTE — ED Notes (Signed)
Patient arrived from [facility] by ems. Patient had surgery on L ankle at [facility]. Staff was concerned about minimal bleeding through cast at L heel.

## 2015-01-11 NOTE — ED Notes (Signed)
Patient being seen for minimal bleeding from cast at the heel on the L leg. Patient reports itching on L leg. Patient also reports burning upon urination.

## 2015-01-12 ENCOUNTER — Encounter: Payer: Self-pay | Admitting: Specialist

## 2015-01-12 DIAGNOSIS — I4891 Unspecified atrial fibrillation: Secondary | ICD-10-CM | POA: Diagnosis not present

## 2015-01-12 DIAGNOSIS — J449 Chronic obstructive pulmonary disease, unspecified: Secondary | ICD-10-CM | POA: Diagnosis not present

## 2015-01-12 DIAGNOSIS — S82842D Displaced bimalleolar fracture of left lower leg, subsequent encounter for closed fracture with routine healing: Secondary | ICD-10-CM | POA: Diagnosis not present

## 2015-01-12 DIAGNOSIS — I739 Peripheral vascular disease, unspecified: Secondary | ICD-10-CM | POA: Diagnosis not present

## 2015-01-12 DIAGNOSIS — M810 Age-related osteoporosis without current pathological fracture: Secondary | ICD-10-CM | POA: Diagnosis not present

## 2015-01-12 DIAGNOSIS — I509 Heart failure, unspecified: Secondary | ICD-10-CM | POA: Diagnosis not present

## 2015-01-20 ENCOUNTER — Ambulatory Visit: Payer: Commercial Managed Care - HMO | Admitting: Cardiovascular Disease

## 2015-01-26 DIAGNOSIS — S82842D Displaced bimalleolar fracture of left lower leg, subsequent encounter for closed fracture with routine healing: Secondary | ICD-10-CM | POA: Diagnosis not present

## 2015-02-01 DIAGNOSIS — N189 Chronic kidney disease, unspecified: Secondary | ICD-10-CM | POA: Diagnosis not present

## 2015-02-01 DIAGNOSIS — I130 Hypertensive heart and chronic kidney disease with heart failure and stage 1 through stage 4 chronic kidney disease, or unspecified chronic kidney disease: Secondary | ICD-10-CM | POA: Diagnosis not present

## 2015-02-01 DIAGNOSIS — I2510 Atherosclerotic heart disease of native coronary artery without angina pectoris: Secondary | ICD-10-CM | POA: Diagnosis not present

## 2015-02-01 DIAGNOSIS — I5032 Chronic diastolic (congestive) heart failure: Secondary | ICD-10-CM | POA: Diagnosis not present

## 2015-02-01 DIAGNOSIS — M6281 Muscle weakness (generalized): Secondary | ICD-10-CM | POA: Diagnosis not present

## 2015-02-01 DIAGNOSIS — S82842D Displaced bimalleolar fracture of left lower leg, subsequent encounter for closed fracture with routine healing: Secondary | ICD-10-CM | POA: Diagnosis not present

## 2015-02-01 DIAGNOSIS — J449 Chronic obstructive pulmonary disease, unspecified: Secondary | ICD-10-CM | POA: Diagnosis not present

## 2015-02-01 DIAGNOSIS — I739 Peripheral vascular disease, unspecified: Secondary | ICD-10-CM | POA: Diagnosis not present

## 2015-02-01 DIAGNOSIS — I480 Paroxysmal atrial fibrillation: Secondary | ICD-10-CM | POA: Diagnosis not present

## 2015-02-02 DIAGNOSIS — R2681 Unsteadiness on feet: Secondary | ICD-10-CM | POA: Diagnosis not present

## 2015-02-02 DIAGNOSIS — J449 Chronic obstructive pulmonary disease, unspecified: Secondary | ICD-10-CM | POA: Diagnosis not present

## 2015-02-03 ENCOUNTER — Ambulatory Visit (INDEPENDENT_AMBULATORY_CARE_PROVIDER_SITE_OTHER): Payer: Commercial Managed Care - HMO | Admitting: *Deleted

## 2015-02-03 DIAGNOSIS — I482 Chronic atrial fibrillation: Secondary | ICD-10-CM

## 2015-02-03 DIAGNOSIS — Z950 Presence of cardiac pacemaker: Secondary | ICD-10-CM

## 2015-02-03 DIAGNOSIS — I2510 Atherosclerotic heart disease of native coronary artery without angina pectoris: Secondary | ICD-10-CM | POA: Diagnosis not present

## 2015-02-03 DIAGNOSIS — J449 Chronic obstructive pulmonary disease, unspecified: Secondary | ICD-10-CM | POA: Diagnosis not present

## 2015-02-03 DIAGNOSIS — I130 Hypertensive heart and chronic kidney disease with heart failure and stage 1 through stage 4 chronic kidney disease, or unspecified chronic kidney disease: Secondary | ICD-10-CM | POA: Diagnosis not present

## 2015-02-03 DIAGNOSIS — I4821 Permanent atrial fibrillation: Secondary | ICD-10-CM

## 2015-02-03 DIAGNOSIS — I5032 Chronic diastolic (congestive) heart failure: Secondary | ICD-10-CM | POA: Diagnosis not present

## 2015-02-03 DIAGNOSIS — I739 Peripheral vascular disease, unspecified: Secondary | ICD-10-CM | POA: Diagnosis not present

## 2015-02-03 DIAGNOSIS — N189 Chronic kidney disease, unspecified: Secondary | ICD-10-CM | POA: Diagnosis not present

## 2015-02-03 DIAGNOSIS — S82842D Displaced bimalleolar fracture of left lower leg, subsequent encounter for closed fracture with routine healing: Secondary | ICD-10-CM | POA: Diagnosis not present

## 2015-02-03 DIAGNOSIS — M6281 Muscle weakness (generalized): Secondary | ICD-10-CM | POA: Diagnosis not present

## 2015-02-03 DIAGNOSIS — I480 Paroxysmal atrial fibrillation: Secondary | ICD-10-CM | POA: Diagnosis not present

## 2015-02-03 LAB — CUP PACEART INCLINIC DEVICE CHECK
Brady Statistic RV Percent Paced: 78 %
Date Time Interrogation Session: 20161025105937
Implantable Lead Implant Date: 20100205
Lead Channel Pacing Threshold Pulse Width: 0.4 ms
Lead Channel Setting Pacing Amplitude: 2.5 V
Lead Channel Setting Pacing Pulse Width: 0.4 ms
Lead Channel Setting Sensing Sensitivity: 5.6 mV
MDC IDC LEAD LOCATION: 753860
MDC IDC MSMT BATTERY IMPEDANCE: 3042 Ohm
MDC IDC MSMT BATTERY REMAINING LONGEVITY: 22 mo
MDC IDC MSMT BATTERY VOLTAGE: 2.72 V
MDC IDC MSMT LEADCHNL RA IMPEDANCE VALUE: 0 Ohm
MDC IDC MSMT LEADCHNL RV IMPEDANCE VALUE: 473 Ohm
MDC IDC MSMT LEADCHNL RV PACING THRESHOLD AMPLITUDE: 0.875 V
MDC IDC MSMT LEADCHNL RV PACING THRESHOLD AMPLITUDE: 1 V
MDC IDC MSMT LEADCHNL RV PACING THRESHOLD PULSEWIDTH: 0.4 ms
MDC IDC MSMT LEADCHNL RV SENSING INTR AMPL: 15.67 mV

## 2015-02-03 NOTE — Progress Notes (Signed)
Pacemaker check in clinic. Normal device function. Threshold, sensing, impedance consistent with previous measurements. Device programmed to maximize longevity. 474 high ventricular rates noted---longest 39min43sec, max-V 208bpm. Device programmed at appropriate safety margins. Histogram distribution appropriate for patient activity level. Device programmed to optimize intrinsic conduction. Estimated longevity 64mo.

## 2015-02-05 DIAGNOSIS — J449 Chronic obstructive pulmonary disease, unspecified: Secondary | ICD-10-CM | POA: Diagnosis not present

## 2015-02-05 DIAGNOSIS — M6281 Muscle weakness (generalized): Secondary | ICD-10-CM | POA: Diagnosis not present

## 2015-02-05 DIAGNOSIS — I739 Peripheral vascular disease, unspecified: Secondary | ICD-10-CM | POA: Diagnosis not present

## 2015-02-05 DIAGNOSIS — S82842D Displaced bimalleolar fracture of left lower leg, subsequent encounter for closed fracture with routine healing: Secondary | ICD-10-CM | POA: Diagnosis not present

## 2015-02-05 DIAGNOSIS — N189 Chronic kidney disease, unspecified: Secondary | ICD-10-CM | POA: Diagnosis not present

## 2015-02-05 DIAGNOSIS — I130 Hypertensive heart and chronic kidney disease with heart failure and stage 1 through stage 4 chronic kidney disease, or unspecified chronic kidney disease: Secondary | ICD-10-CM | POA: Diagnosis not present

## 2015-02-05 DIAGNOSIS — I480 Paroxysmal atrial fibrillation: Secondary | ICD-10-CM | POA: Diagnosis not present

## 2015-02-05 DIAGNOSIS — I2510 Atherosclerotic heart disease of native coronary artery without angina pectoris: Secondary | ICD-10-CM | POA: Diagnosis not present

## 2015-02-05 DIAGNOSIS — I5032 Chronic diastolic (congestive) heart failure: Secondary | ICD-10-CM | POA: Diagnosis not present

## 2015-02-06 DIAGNOSIS — I480 Paroxysmal atrial fibrillation: Secondary | ICD-10-CM | POA: Diagnosis not present

## 2015-02-06 DIAGNOSIS — I2510 Atherosclerotic heart disease of native coronary artery without angina pectoris: Secondary | ICD-10-CM | POA: Diagnosis not present

## 2015-02-06 DIAGNOSIS — S82842D Displaced bimalleolar fracture of left lower leg, subsequent encounter for closed fracture with routine healing: Secondary | ICD-10-CM | POA: Diagnosis not present

## 2015-02-06 DIAGNOSIS — I5032 Chronic diastolic (congestive) heart failure: Secondary | ICD-10-CM | POA: Diagnosis not present

## 2015-02-06 DIAGNOSIS — J449 Chronic obstructive pulmonary disease, unspecified: Secondary | ICD-10-CM | POA: Diagnosis not present

## 2015-02-06 DIAGNOSIS — I130 Hypertensive heart and chronic kidney disease with heart failure and stage 1 through stage 4 chronic kidney disease, or unspecified chronic kidney disease: Secondary | ICD-10-CM | POA: Diagnosis not present

## 2015-02-06 DIAGNOSIS — I739 Peripheral vascular disease, unspecified: Secondary | ICD-10-CM | POA: Diagnosis not present

## 2015-02-06 DIAGNOSIS — N189 Chronic kidney disease, unspecified: Secondary | ICD-10-CM | POA: Diagnosis not present

## 2015-02-06 DIAGNOSIS — M6281 Muscle weakness (generalized): Secondary | ICD-10-CM | POA: Diagnosis not present

## 2015-02-09 DIAGNOSIS — I2510 Atherosclerotic heart disease of native coronary artery without angina pectoris: Secondary | ICD-10-CM | POA: Diagnosis not present

## 2015-02-09 DIAGNOSIS — M19012 Primary osteoarthritis, left shoulder: Secondary | ICD-10-CM | POA: Diagnosis not present

## 2015-02-09 DIAGNOSIS — I130 Hypertensive heart and chronic kidney disease with heart failure and stage 1 through stage 4 chronic kidney disease, or unspecified chronic kidney disease: Secondary | ICD-10-CM | POA: Diagnosis not present

## 2015-02-09 DIAGNOSIS — M6281 Muscle weakness (generalized): Secondary | ICD-10-CM | POA: Diagnosis not present

## 2015-02-09 DIAGNOSIS — J449 Chronic obstructive pulmonary disease, unspecified: Secondary | ICD-10-CM | POA: Diagnosis not present

## 2015-02-09 DIAGNOSIS — I5032 Chronic diastolic (congestive) heart failure: Secondary | ICD-10-CM | POA: Diagnosis not present

## 2015-02-09 DIAGNOSIS — S82842D Displaced bimalleolar fracture of left lower leg, subsequent encounter for closed fracture with routine healing: Secondary | ICD-10-CM | POA: Diagnosis not present

## 2015-02-09 DIAGNOSIS — I739 Peripheral vascular disease, unspecified: Secondary | ICD-10-CM | POA: Diagnosis not present

## 2015-02-09 DIAGNOSIS — I480 Paroxysmal atrial fibrillation: Secondary | ICD-10-CM | POA: Diagnosis not present

## 2015-02-09 DIAGNOSIS — N189 Chronic kidney disease, unspecified: Secondary | ICD-10-CM | POA: Diagnosis not present

## 2015-02-10 DIAGNOSIS — N189 Chronic kidney disease, unspecified: Secondary | ICD-10-CM | POA: Diagnosis not present

## 2015-02-10 DIAGNOSIS — N289 Disorder of kidney and ureter, unspecified: Secondary | ICD-10-CM | POA: Diagnosis not present

## 2015-02-10 DIAGNOSIS — I739 Peripheral vascular disease, unspecified: Secondary | ICD-10-CM | POA: Diagnosis not present

## 2015-02-10 DIAGNOSIS — M6281 Muscle weakness (generalized): Secondary | ICD-10-CM | POA: Diagnosis not present

## 2015-02-10 DIAGNOSIS — I480 Paroxysmal atrial fibrillation: Secondary | ICD-10-CM | POA: Diagnosis not present

## 2015-02-10 DIAGNOSIS — M79672 Pain in left foot: Secondary | ICD-10-CM | POA: Diagnosis not present

## 2015-02-10 DIAGNOSIS — I2510 Atherosclerotic heart disease of native coronary artery without angina pectoris: Secondary | ICD-10-CM | POA: Diagnosis not present

## 2015-02-10 DIAGNOSIS — I5032 Chronic diastolic (congestive) heart failure: Secondary | ICD-10-CM | POA: Diagnosis not present

## 2015-02-10 DIAGNOSIS — J449 Chronic obstructive pulmonary disease, unspecified: Secondary | ICD-10-CM | POA: Diagnosis not present

## 2015-02-10 DIAGNOSIS — I130 Hypertensive heart and chronic kidney disease with heart failure and stage 1 through stage 4 chronic kidney disease, or unspecified chronic kidney disease: Secondary | ICD-10-CM | POA: Diagnosis not present

## 2015-02-10 DIAGNOSIS — S82842D Displaced bimalleolar fracture of left lower leg, subsequent encounter for closed fracture with routine healing: Secondary | ICD-10-CM | POA: Diagnosis not present

## 2015-02-10 DIAGNOSIS — I10 Essential (primary) hypertension: Secondary | ICD-10-CM | POA: Diagnosis not present

## 2015-02-12 DIAGNOSIS — I130 Hypertensive heart and chronic kidney disease with heart failure and stage 1 through stage 4 chronic kidney disease, or unspecified chronic kidney disease: Secondary | ICD-10-CM | POA: Diagnosis not present

## 2015-02-12 DIAGNOSIS — J449 Chronic obstructive pulmonary disease, unspecified: Secondary | ICD-10-CM | POA: Diagnosis not present

## 2015-02-12 DIAGNOSIS — S82842D Displaced bimalleolar fracture of left lower leg, subsequent encounter for closed fracture with routine healing: Secondary | ICD-10-CM | POA: Diagnosis not present

## 2015-02-12 DIAGNOSIS — N189 Chronic kidney disease, unspecified: Secondary | ICD-10-CM | POA: Diagnosis not present

## 2015-02-12 DIAGNOSIS — I2510 Atherosclerotic heart disease of native coronary artery without angina pectoris: Secondary | ICD-10-CM | POA: Diagnosis not present

## 2015-02-12 DIAGNOSIS — I5032 Chronic diastolic (congestive) heart failure: Secondary | ICD-10-CM | POA: Diagnosis not present

## 2015-02-12 DIAGNOSIS — I739 Peripheral vascular disease, unspecified: Secondary | ICD-10-CM | POA: Diagnosis not present

## 2015-02-12 DIAGNOSIS — M6281 Muscle weakness (generalized): Secondary | ICD-10-CM | POA: Diagnosis not present

## 2015-02-12 DIAGNOSIS — I480 Paroxysmal atrial fibrillation: Secondary | ICD-10-CM | POA: Diagnosis not present

## 2015-02-15 ENCOUNTER — Emergency Department: Payer: Commercial Managed Care - HMO

## 2015-02-15 ENCOUNTER — Encounter: Payer: Self-pay | Admitting: Emergency Medicine

## 2015-02-15 ENCOUNTER — Emergency Department
Admission: EM | Admit: 2015-02-15 | Discharge: 2015-02-15 | Disposition: A | Discharge status: Home / Self Care | Payer: Commercial Managed Care - HMO | Attending: Emergency Medicine | Admitting: Emergency Medicine

## 2015-02-15 DIAGNOSIS — I509 Heart failure, unspecified: Secondary | ICD-10-CM | POA: Diagnosis not present

## 2015-02-15 DIAGNOSIS — J441 Chronic obstructive pulmonary disease with (acute) exacerbation: Secondary | ICD-10-CM | POA: Diagnosis not present

## 2015-02-15 DIAGNOSIS — R0600 Dyspnea, unspecified: Secondary | ICD-10-CM

## 2015-02-15 DIAGNOSIS — Z87891 Personal history of nicotine dependence: Secondary | ICD-10-CM | POA: Insufficient documentation

## 2015-02-15 DIAGNOSIS — Z79899 Other long term (current) drug therapy: Secondary | ICD-10-CM | POA: Insufficient documentation

## 2015-02-15 DIAGNOSIS — I129 Hypertensive chronic kidney disease with stage 1 through stage 4 chronic kidney disease, or unspecified chronic kidney disease: Secondary | ICD-10-CM | POA: Insufficient documentation

## 2015-02-15 DIAGNOSIS — N183 Chronic kidney disease, stage 3 (moderate): Secondary | ICD-10-CM | POA: Insufficient documentation

## 2015-02-15 DIAGNOSIS — Z7902 Long term (current) use of antithrombotics/antiplatelets: Secondary | ICD-10-CM | POA: Diagnosis not present

## 2015-02-15 DIAGNOSIS — Z791 Long term (current) use of non-steroidal anti-inflammatories (NSAID): Secondary | ICD-10-CM | POA: Diagnosis not present

## 2015-02-15 DIAGNOSIS — R0602 Shortness of breath: Secondary | ICD-10-CM | POA: Diagnosis not present

## 2015-02-15 DIAGNOSIS — R0789 Other chest pain: Secondary | ICD-10-CM | POA: Diagnosis not present

## 2015-02-15 LAB — BASIC METABOLIC PANEL
Anion gap: 4 — ABNORMAL LOW (ref 5–15)
BUN: 47 mg/dL — AB (ref 6–20)
CHLORIDE: 107 mmol/L (ref 101–111)
CO2: 27 mmol/L (ref 22–32)
CREATININE: 2.76 mg/dL — AB (ref 0.61–1.24)
Calcium: 8.9 mg/dL (ref 8.9–10.3)
GFR calc Af Amer: 24 mL/min — ABNORMAL LOW (ref >60)
GFR calc non Af Amer: 20 mL/min — ABNORMAL LOW (ref >60)
GLUCOSE: 112 mg/dL — AB (ref 65–99)
POTASSIUM: 5.5 mmol/L — AB (ref 3.5–5.1)
Sodium: 138 mmol/L (ref 135–145)

## 2015-02-15 LAB — CBC
HEMATOCRIT: 39.7 % — AB (ref 40.0–52.0)
Hemoglobin: 12.8 g/dL — ABNORMAL LOW (ref 13.0–18.0)
MCH: 29.7 pg (ref 26.0–34.0)
MCHC: 32.3 g/dL (ref 32.0–36.0)
MCV: 91.8 fL (ref 80.0–100.0)
Platelets: 175 10*3/uL (ref 150–440)
RBC: 4.32 MIL/uL — ABNORMAL LOW (ref 4.40–5.90)
RDW: 15.3 % — AB (ref 11.5–14.5)
WBC: 10.3 10*3/uL (ref 3.8–10.6)

## 2015-02-15 LAB — PROTIME-INR
INR: 1.03
PROTHROMBIN TIME: 13.7 s (ref 11.4–15.0)

## 2015-02-15 LAB — TROPONIN I: Troponin I: <0.03 ng/mL (ref <0.031)

## 2015-02-15 LAB — BRAIN NATRIURETIC PEPTIDE: B Natriuretic Peptide: 419 pg/mL — ABNORMAL HIGH (ref 0.0–100.0)

## 2015-02-15 MED ORDER — FUROSEMIDE 10 MG/ML IJ SOLN
40.0000 mg | Freq: Once | INTRAMUSCULAR | Status: AC
  Administered 2015-02-15: 40 mg via INTRAVENOUS
  Filled 2015-02-15: qty 4

## 2015-02-15 NOTE — ED Notes (Signed)
C/o left arm numbness and difficulty breathing x 3-4 hours.  C/o  Chest pain, onset 3-4 hours ago.  C/o pain to left shoulder.

## 2015-02-15 NOTE — ED Provider Notes (Signed)
[facility] Emergency Department Provider Note  ____________________________________________  Time seen: Approximately 520 PM  I have reviewed the triage vital signs and the nursing notes.   HISTORY  Chief Complaint Chest Pain and Shortness of Breath    HPI [name] is a 81 y.o. male with a history of coronary artery disease as well as CHF who is presenting today with a 3-4 hour episode of shortness of breath. He says that he was eating lunch today when he became suddenly short of breath. He said that the episode resolved and he feels back to his baseline at this time. He did not have any chest pain at this time. Denies any nausea or vomiting. He says that he doesn't pain and numbness in his left shoulder that are chronic. He says he receives steroid injections into his left shoulder for this pain and is unchanged. He says that he gets the shortness of breath episodes about every other day. He did not want come to the hospital today but is encouraged to do so by his wife. Denies any chest pain at the time of this exam.   Past Medical History  Diagnosis Date  . Chronic atrial fibrillation (HCC)     a. on pradaxa  . Coronary artery disease     a. s/p MI x 3;  b. s/p CABG x2 in 1999;  c. Cath 2007: LIMA->LAD patent, VG->OM 100, PCI/DES of RPL/mid RCA (cypher DES).  . Chronic obstructive pulmonary disease ([place])   . GERD (gastroesophageal reflux disease)   . Hyperlipidemia   . Hypertension   . PVD (peripheral vascular disease) ([place])   . AAA (abdominal aortic aneurysm) ([place])     a. s/p repair - 1999 @ [facility] @ time of CABG.  . Chronic diastolic CHF (congestive heart failure) ([place])     a. 01/2012 Echo:  EF 55%.  . Tachy-brady syndrome ([place])     a. s/p PPM 2010.  [name] CKD (chronic kidney disease), stage III     a. Acute on chronic 03/2012  . Cancer ([place])     skin cancer nose  . Sleep apnea   . Broken ankle     Patient Active Problem List   Diagnosis Date  Noted  . Bimalleolar fracture of left ankle 01/07/2015  . COPD exacerbation ([place]) 08/22/2014  . Coronary artery disease   . Chronic atrial fibrillation ([place])   . Chronic diastolic CHF (congestive heart failure) ([place])   . Tachy-brady syndrome ([place])   . CKD (chronic kidney disease), stage III 04/09/2012  . Chronic diastolic heart failure ([place]) 01/26/2012  . Chest pain 09/23/2010  . Hyperlipidemia 06/12/2009  . ESSENTIAL HYPERTENSION 06/12/2009  . CORONARY ATHEROSCLEROSIS OF ARTERY BYPASS GRAFT 06/12/2009  . ATRIAL FIBRILLATION 06/12/2009  . AAA 06/12/2009  . COPD 06/12/2009  . GERD 06/12/2009  . PACEMAKER, PERMANENT 06/12/2009    Past Surgical History  Procedure Laterality Date  . Coronary artery bypass graft    . Abdominal aortic aneurysm repair    . Insert / replace / remove pacemaker    . Foot surgery      left foot surgery to remove cyst.  . Trigger finger release    . Skin cancer biopsy      nose  . Ankle surgery Right   . Orif ankle fracture Left 01/08/2015    Procedure: OPEN REDUCTION INTERNAL FIXATION (ORIF) ANKLE FRACTURE;  Surgeon: Earnestine Leys, MD;  Location: [facility] ORS;  Service: Orthopedics;  Laterality: Left;  Current Outpatient Rx  Name  Route  Sig  Dispense  Refill  . apixaban (ELIQUIS) 2.5 MG TABS tablet   Oral   Take 1 tablet (2.5 mg total) by mouth 2 (two) times daily.   60 tablet   11   . carisoprodol (SOMA) 350 MG tablet   Oral   Take 1 tablet (350 mg total) by mouth 3 (three) times daily.   30 tablet   0   . diltiazem (CARDIZEM CD) 120 MG 24 hr capsule   Oral   Take 1 capsule (120 mg total) by mouth daily.   30 capsule   3   . donepezil (ARICEPT) 5 MG tablet   Oral   Take 5 mg by mouth at bedtime.         [name] guaiFENesin (MUCINEX) 600 MG 12 hr tablet   Oral   Take 1 tablet (600 mg total) by mouth 2 (two) times daily. Patient not taking: Reported on 02/03/2015   30 tablet   0   . HYDROcodone-acetaminophen (NORCO) 7.5-325 MG tablet    Oral   Take 1 tablet by mouth every 6 (six) hours.   30 tablet   0   . HYDROcodone-acetaminophen (NORCO) 7.5-325 MG tablet   Oral   Take 1-2 tablets by mouth every 4 (four) hours as needed (breakthrough pain).   30 tablet   0   . ipratropium-albuterol (DUONEB) 0.5-2.5 (3) MG/3ML SOLN   Nebulization   Take 3 mLs by nebulization every 4 (four) hours as needed (for wheezing).   360 mL   0   . metoprolol succinate (TOPROL-XL) 100 MG 24 hr tablet   Oral   Take 1 tablet (100 mg total) by mouth daily.   30 tablet   3   . naproxen sodium (ANAPROX) 220 MG tablet   Oral   Take 440 mg by mouth at bedtime as needed (for pain.).         [name] omeprazole (PRILOSEC) 20 MG capsule   Oral   Take 20 mg by mouth daily.           . simvastatin (ZOCOR) 20 MG tablet   Oral   Take 1 tablet (20 mg total) by mouth at bedtime.   30 tablet   6   . Tamsulosin HCl (FLOMAX) 0.4 MG CAPS   Oral   Take 0.4 mg by mouth daily after supper.         . torsemide (DEMADEX) 20 MG tablet   Oral   Take 20 mg by mouth daily as needed (for weight gain).          . traZODone (DESYREL) 50 MG tablet   Oral   Take 1 tablet (50 mg total) by mouth at bedtime. Patient taking differently: Take 25 mg by mouth at bedtime.    30 tablet   3     Allergies Lipitor  Family History  Problem Relation Age of Onset  . Stomach cancer Mother     died @ 74  . Diabetes Mother   . Hypertension Mother   . Coronary artery disease Mother   . Cerebral aneurysm Father     died @ 69  . Diabetes Father   . Hypertension Father   . Coronary artery disease Father     Social History Social History  Substance Use Topics  . Smoking status: Former Smoker -- 1.00 packs/day for 20 years    Quit date: 04/11/1997  . Smokeless tobacco: Never Used  .  Alcohol Use: No     Comment: Rare    Review of Systems Constitutional: No fever/chills Eyes: No visual changes. ENT: No sore throat. Cardiovascular: Denies chest  pain. Respiratory: As above Gastrointestinal: No abdominal pain.  No nausea, no vomiting.  No diarrhea.  No constipation. Genitourinary: Negative for dysuria. Musculoskeletal: Negative for back pain. Skin: Negative for rash. Neurological: Negative for headaches, focal weakness or numbness.  10-point ROS otherwise negative.  ____________________________________________   PHYSICAL EXAM:  VITAL SIGNS: ED Triage Vitals  Enc Vitals Group     BP 02/15/15 1405 133/64 mmHg     Pulse Rate 02/15/15 1405 62     Resp 02/15/15 1405 28     Temp 02/15/15 1405 97.5 F (36.4 C)     Temp src --      SpO2 02/15/15 1405 100 %     Weight 02/15/15 1405 235 lb (106.595 kg)     Height 02/15/15 1405 6\' 1"  (1.854 m)     Head Cir --      Peak Flow --      Pain Score 02/15/15 1402 7     Pain Loc --      Pain Edu? --      Excl. in [place]? --     Constitutional: Alert and oriented. Well appearing and in no acute distress. Eyes: Conjunctivae are normal. PERRL. EOMI. Head: Atraumatic. Nose: No congestion/rhinnorhea. Mouth/Throat: Mucous membranes are moist.  Oropharynx non-erythematous. Neck: No stridor.   Cardiovascular: Normal rate, regular rhythm. Grossly normal heart sounds.  Good peripheral circulation. Respiratory: Normal respiratory effort.  No retractions. Lungs CTAB. Gastrointestinal: Soft and nontender. No distention. No abdominal bruits. No CVA tenderness. Musculoskeletal: No lower extremity tenderness nor edema.  No joint effusions. Neurologic:  Normal speech and language. No gross focal neurologic deficits are appreciated. No gait instability. Skin:  Skin is warm, dry and intact. No rash noted. Psychiatric: Mood and affect are normal. Speech and behavior are normal.  ____________________________________________   LABS (all labs ordered are listed, but only abnormal results are displayed)  Labs Reviewed  BASIC METABOLIC PANEL - Abnormal; Notable for the following:    Potassium 5.5 (*)     Glucose, Bld 112 (*)    BUN 47 (*)    Creatinine, Ser 2.76 (*)    GFR calc non Af Amer 20 (*)    GFR calc Af Amer 24 (*)    Anion gap 4 (*)    All other components within normal limits  CBC - Abnormal; Notable for the following:    RBC 4.32 (*)    Hemoglobin 12.8 (*)    HCT 39.7 (*)    RDW 15.3 (*)    All other components within normal limits  BRAIN NATRIURETIC PEPTIDE - Abnormal; Notable for the following:    B Natriuretic Peptide 419.0 (*)    All other components within normal limits  TROPONIN I  PROTIME-INR  TROPONIN I   ____________________________________________  EKG  ED ECG REPORT I, Doran Stabler, the attending physician, personally viewed and interpreted this ECG.   Date: 02/15/2015  EKG Time: 1404  Rate: 60  Rhythm: Ventricular pacing  Axis: Normal  Intervals:Wide complex secondary to ventricular pacing.  ST&T Change: T-wave inversion in aVL is unchanged from 09/03/2014. The EKG does not meet Sgarbossa's criteria.    ____________________________________________  RADIOLOGY  No acute findings on the chest x-ray. ____________________________________________   PROCEDURES    ____________________________________________   INITIAL IMPRESSION / ASSESSMENT AND  PLAN / ED COURSE  Pertinent labs & imaging results that were available during my care of the patient were reviewed by me and considered in my medical decision making (see chart for details).  ----------------------------------------- 6:48 PM on 02/15/2015 -----------------------------------------  Patient feels back to baseline now. His breathing rate is 18. Had a urine void of 390 mL's after the Lasix bolus. He says he is compliant with his torsemide at home. I was also able to talk to Dr. Burt Knack who works with Dr.Gollan (patient's cardiologist).  Dr. Burt Knack says that he will have the office call the patient for urgent follow-up. The patient continues to be without any chest pain and is at  his baseline breathing. Will be discharged home. He understands he may return for any worsening or concerning symptoms. Understands the plan and willing to comply.   ____________________________________________   FINAL CLINICAL IMPRESSION(S) / ED DIAGNOSES  Dyspnea. CHF.    Orbie Pyo, MD 02/15/15 [phone]

## 2015-02-16 DIAGNOSIS — S82842D Displaced bimalleolar fracture of left lower leg, subsequent encounter for closed fracture with routine healing: Secondary | ICD-10-CM | POA: Diagnosis not present

## 2015-02-16 DIAGNOSIS — I130 Hypertensive heart and chronic kidney disease with heart failure and stage 1 through stage 4 chronic kidney disease, or unspecified chronic kidney disease: Secondary | ICD-10-CM | POA: Diagnosis not present

## 2015-02-16 DIAGNOSIS — M6281 Muscle weakness (generalized): Secondary | ICD-10-CM | POA: Diagnosis not present

## 2015-02-16 DIAGNOSIS — I5032 Chronic diastolic (congestive) heart failure: Secondary | ICD-10-CM | POA: Diagnosis not present

## 2015-02-16 DIAGNOSIS — I739 Peripheral vascular disease, unspecified: Secondary | ICD-10-CM | POA: Diagnosis not present

## 2015-02-16 DIAGNOSIS — N189 Chronic kidney disease, unspecified: Secondary | ICD-10-CM | POA: Diagnosis not present

## 2015-02-16 DIAGNOSIS — J449 Chronic obstructive pulmonary disease, unspecified: Secondary | ICD-10-CM | POA: Diagnosis not present

## 2015-02-16 DIAGNOSIS — I480 Paroxysmal atrial fibrillation: Secondary | ICD-10-CM | POA: Diagnosis not present

## 2015-02-16 DIAGNOSIS — I2510 Atherosclerotic heart disease of native coronary artery without angina pectoris: Secondary | ICD-10-CM | POA: Diagnosis not present

## 2015-02-17 DIAGNOSIS — I2510 Atherosclerotic heart disease of native coronary artery without angina pectoris: Secondary | ICD-10-CM | POA: Diagnosis not present

## 2015-02-17 DIAGNOSIS — I480 Paroxysmal atrial fibrillation: Secondary | ICD-10-CM | POA: Diagnosis not present

## 2015-02-17 DIAGNOSIS — I5032 Chronic diastolic (congestive) heart failure: Secondary | ICD-10-CM | POA: Diagnosis not present

## 2015-02-17 DIAGNOSIS — M6281 Muscle weakness (generalized): Secondary | ICD-10-CM | POA: Diagnosis not present

## 2015-02-17 DIAGNOSIS — N189 Chronic kidney disease, unspecified: Secondary | ICD-10-CM | POA: Diagnosis not present

## 2015-02-17 DIAGNOSIS — I130 Hypertensive heart and chronic kidney disease with heart failure and stage 1 through stage 4 chronic kidney disease, or unspecified chronic kidney disease: Secondary | ICD-10-CM | POA: Diagnosis not present

## 2015-02-17 DIAGNOSIS — S82842D Displaced bimalleolar fracture of left lower leg, subsequent encounter for closed fracture with routine healing: Secondary | ICD-10-CM | POA: Diagnosis not present

## 2015-02-17 DIAGNOSIS — I739 Peripheral vascular disease, unspecified: Secondary | ICD-10-CM | POA: Diagnosis not present

## 2015-02-17 DIAGNOSIS — J449 Chronic obstructive pulmonary disease, unspecified: Secondary | ICD-10-CM | POA: Diagnosis not present

## 2015-02-19 DIAGNOSIS — I130 Hypertensive heart and chronic kidney disease with heart failure and stage 1 through stage 4 chronic kidney disease, or unspecified chronic kidney disease: Secondary | ICD-10-CM | POA: Diagnosis not present

## 2015-02-19 DIAGNOSIS — I480 Paroxysmal atrial fibrillation: Secondary | ICD-10-CM | POA: Diagnosis not present

## 2015-02-19 DIAGNOSIS — N189 Chronic kidney disease, unspecified: Secondary | ICD-10-CM | POA: Diagnosis not present

## 2015-02-19 DIAGNOSIS — I739 Peripheral vascular disease, unspecified: Secondary | ICD-10-CM | POA: Diagnosis not present

## 2015-02-19 DIAGNOSIS — I5032 Chronic diastolic (congestive) heart failure: Secondary | ICD-10-CM | POA: Diagnosis not present

## 2015-02-19 DIAGNOSIS — J449 Chronic obstructive pulmonary disease, unspecified: Secondary | ICD-10-CM | POA: Diagnosis not present

## 2015-02-19 DIAGNOSIS — M6281 Muscle weakness (generalized): Secondary | ICD-10-CM | POA: Diagnosis not present

## 2015-02-19 DIAGNOSIS — S82842D Displaced bimalleolar fracture of left lower leg, subsequent encounter for closed fracture with routine healing: Secondary | ICD-10-CM | POA: Diagnosis not present

## 2015-02-19 DIAGNOSIS — I2510 Atherosclerotic heart disease of native coronary artery without angina pectoris: Secondary | ICD-10-CM | POA: Diagnosis not present

## 2015-02-24 DIAGNOSIS — I2510 Atherosclerotic heart disease of native coronary artery without angina pectoris: Secondary | ICD-10-CM | POA: Diagnosis not present

## 2015-02-24 DIAGNOSIS — I739 Peripheral vascular disease, unspecified: Secondary | ICD-10-CM | POA: Diagnosis not present

## 2015-02-24 DIAGNOSIS — M6281 Muscle weakness (generalized): Secondary | ICD-10-CM | POA: Diagnosis not present

## 2015-02-24 DIAGNOSIS — N189 Chronic kidney disease, unspecified: Secondary | ICD-10-CM | POA: Diagnosis not present

## 2015-02-24 DIAGNOSIS — I480 Paroxysmal atrial fibrillation: Secondary | ICD-10-CM | POA: Diagnosis not present

## 2015-02-24 DIAGNOSIS — S82842D Displaced bimalleolar fracture of left lower leg, subsequent encounter for closed fracture with routine healing: Secondary | ICD-10-CM | POA: Diagnosis not present

## 2015-02-24 DIAGNOSIS — I5032 Chronic diastolic (congestive) heart failure: Secondary | ICD-10-CM | POA: Diagnosis not present

## 2015-02-24 DIAGNOSIS — I130 Hypertensive heart and chronic kidney disease with heart failure and stage 1 through stage 4 chronic kidney disease, or unspecified chronic kidney disease: Secondary | ICD-10-CM | POA: Diagnosis not present

## 2015-02-24 DIAGNOSIS — J449 Chronic obstructive pulmonary disease, unspecified: Secondary | ICD-10-CM | POA: Diagnosis not present

## 2015-02-25 DIAGNOSIS — I130 Hypertensive heart and chronic kidney disease with heart failure and stage 1 through stage 4 chronic kidney disease, or unspecified chronic kidney disease: Secondary | ICD-10-CM | POA: Diagnosis not present

## 2015-02-25 DIAGNOSIS — S82842D Displaced bimalleolar fracture of left lower leg, subsequent encounter for closed fracture with routine healing: Secondary | ICD-10-CM | POA: Diagnosis not present

## 2015-02-25 DIAGNOSIS — M6281 Muscle weakness (generalized): Secondary | ICD-10-CM | POA: Diagnosis not present

## 2015-02-26 DIAGNOSIS — I739 Peripheral vascular disease, unspecified: Secondary | ICD-10-CM | POA: Diagnosis not present

## 2015-02-26 DIAGNOSIS — J449 Chronic obstructive pulmonary disease, unspecified: Secondary | ICD-10-CM | POA: Diagnosis not present

## 2015-02-26 DIAGNOSIS — S82842D Displaced bimalleolar fracture of left lower leg, subsequent encounter for closed fracture with routine healing: Secondary | ICD-10-CM | POA: Diagnosis not present

## 2015-02-26 DIAGNOSIS — N189 Chronic kidney disease, unspecified: Secondary | ICD-10-CM | POA: Diagnosis not present

## 2015-02-26 DIAGNOSIS — M6281 Muscle weakness (generalized): Secondary | ICD-10-CM | POA: Diagnosis not present

## 2015-02-26 DIAGNOSIS — I5032 Chronic diastolic (congestive) heart failure: Secondary | ICD-10-CM | POA: Diagnosis not present

## 2015-02-26 DIAGNOSIS — I480 Paroxysmal atrial fibrillation: Secondary | ICD-10-CM | POA: Diagnosis not present

## 2015-02-26 DIAGNOSIS — I2510 Atherosclerotic heart disease of native coronary artery without angina pectoris: Secondary | ICD-10-CM | POA: Diagnosis not present

## 2015-02-26 DIAGNOSIS — I130 Hypertensive heart and chronic kidney disease with heart failure and stage 1 through stage 4 chronic kidney disease, or unspecified chronic kidney disease: Secondary | ICD-10-CM | POA: Diagnosis not present

## 2015-03-02 DIAGNOSIS — R2681 Unsteadiness on feet: Secondary | ICD-10-CM | POA: Diagnosis not present

## 2015-03-02 DIAGNOSIS — S82842A Displaced bimalleolar fracture of left lower leg, initial encounter for closed fracture: Secondary | ICD-10-CM | POA: Diagnosis not present

## 2015-03-02 DIAGNOSIS — M19012 Primary osteoarthritis, left shoulder: Secondary | ICD-10-CM | POA: Diagnosis not present

## 2015-03-02 DIAGNOSIS — S82842D Displaced bimalleolar fracture of left lower leg, subsequent encounter for closed fracture with routine healing: Secondary | ICD-10-CM | POA: Diagnosis not present

## 2015-03-02 DIAGNOSIS — I509 Heart failure, unspecified: Secondary | ICD-10-CM | POA: Diagnosis not present

## 2015-03-02 DIAGNOSIS — J449 Chronic obstructive pulmonary disease, unspecified: Secondary | ICD-10-CM | POA: Diagnosis not present

## 2015-03-02 DIAGNOSIS — I5032 Chronic diastolic (congestive) heart failure: Secondary | ICD-10-CM | POA: Diagnosis not present

## 2015-03-03 DIAGNOSIS — I480 Paroxysmal atrial fibrillation: Secondary | ICD-10-CM | POA: Diagnosis not present

## 2015-03-03 DIAGNOSIS — I130 Hypertensive heart and chronic kidney disease with heart failure and stage 1 through stage 4 chronic kidney disease, or unspecified chronic kidney disease: Secondary | ICD-10-CM | POA: Diagnosis not present

## 2015-03-03 DIAGNOSIS — I5032 Chronic diastolic (congestive) heart failure: Secondary | ICD-10-CM | POA: Diagnosis not present

## 2015-03-03 DIAGNOSIS — J449 Chronic obstructive pulmonary disease, unspecified: Secondary | ICD-10-CM | POA: Diagnosis not present

## 2015-03-03 DIAGNOSIS — I2510 Atherosclerotic heart disease of native coronary artery without angina pectoris: Secondary | ICD-10-CM | POA: Diagnosis not present

## 2015-03-03 DIAGNOSIS — I739 Peripheral vascular disease, unspecified: Secondary | ICD-10-CM | POA: Diagnosis not present

## 2015-03-03 DIAGNOSIS — N189 Chronic kidney disease, unspecified: Secondary | ICD-10-CM | POA: Diagnosis not present

## 2015-03-03 DIAGNOSIS — S82842D Displaced bimalleolar fracture of left lower leg, subsequent encounter for closed fracture with routine healing: Secondary | ICD-10-CM | POA: Diagnosis not present

## 2015-03-03 DIAGNOSIS — M6281 Muscle weakness (generalized): Secondary | ICD-10-CM | POA: Diagnosis not present

## 2015-03-04 DIAGNOSIS — S82842D Displaced bimalleolar fracture of left lower leg, subsequent encounter for closed fracture with routine healing: Secondary | ICD-10-CM | POA: Diagnosis not present

## 2015-03-04 DIAGNOSIS — I2510 Atherosclerotic heart disease of native coronary artery without angina pectoris: Secondary | ICD-10-CM | POA: Diagnosis not present

## 2015-03-04 DIAGNOSIS — I480 Paroxysmal atrial fibrillation: Secondary | ICD-10-CM | POA: Diagnosis not present

## 2015-03-04 DIAGNOSIS — I5032 Chronic diastolic (congestive) heart failure: Secondary | ICD-10-CM | POA: Diagnosis not present

## 2015-03-04 DIAGNOSIS — I130 Hypertensive heart and chronic kidney disease with heart failure and stage 1 through stage 4 chronic kidney disease, or unspecified chronic kidney disease: Secondary | ICD-10-CM | POA: Diagnosis not present

## 2015-03-04 DIAGNOSIS — J449 Chronic obstructive pulmonary disease, unspecified: Secondary | ICD-10-CM | POA: Diagnosis not present

## 2015-03-04 DIAGNOSIS — I739 Peripheral vascular disease, unspecified: Secondary | ICD-10-CM | POA: Diagnosis not present

## 2015-03-04 DIAGNOSIS — M6281 Muscle weakness (generalized): Secondary | ICD-10-CM | POA: Diagnosis not present

## 2015-03-04 DIAGNOSIS — N189 Chronic kidney disease, unspecified: Secondary | ICD-10-CM | POA: Diagnosis not present

## 2015-03-05 DIAGNOSIS — R2681 Unsteadiness on feet: Secondary | ICD-10-CM | POA: Diagnosis not present

## 2015-03-05 DIAGNOSIS — J449 Chronic obstructive pulmonary disease, unspecified: Secondary | ICD-10-CM | POA: Diagnosis not present

## 2015-03-10 DIAGNOSIS — I739 Peripheral vascular disease, unspecified: Secondary | ICD-10-CM | POA: Diagnosis not present

## 2015-03-10 DIAGNOSIS — S82842D Displaced bimalleolar fracture of left lower leg, subsequent encounter for closed fracture with routine healing: Secondary | ICD-10-CM | POA: Diagnosis not present

## 2015-03-10 DIAGNOSIS — N189 Chronic kidney disease, unspecified: Secondary | ICD-10-CM | POA: Diagnosis not present

## 2015-03-10 DIAGNOSIS — M6281 Muscle weakness (generalized): Secondary | ICD-10-CM | POA: Diagnosis not present

## 2015-03-10 DIAGNOSIS — I480 Paroxysmal atrial fibrillation: Secondary | ICD-10-CM | POA: Diagnosis not present

## 2015-03-10 DIAGNOSIS — I2510 Atherosclerotic heart disease of native coronary artery without angina pectoris: Secondary | ICD-10-CM | POA: Diagnosis not present

## 2015-03-10 DIAGNOSIS — J449 Chronic obstructive pulmonary disease, unspecified: Secondary | ICD-10-CM | POA: Diagnosis not present

## 2015-03-10 DIAGNOSIS — I5032 Chronic diastolic (congestive) heart failure: Secondary | ICD-10-CM | POA: Diagnosis not present

## 2015-03-10 DIAGNOSIS — I130 Hypertensive heart and chronic kidney disease with heart failure and stage 1 through stage 4 chronic kidney disease, or unspecified chronic kidney disease: Secondary | ICD-10-CM | POA: Diagnosis not present

## 2015-03-11 ENCOUNTER — Ambulatory Visit (INDEPENDENT_AMBULATORY_CARE_PROVIDER_SITE_OTHER): Payer: Commercial Managed Care - HMO | Admitting: Cardiovascular Disease

## 2015-03-11 ENCOUNTER — Encounter: Payer: Self-pay | Admitting: Cardiovascular Disease

## 2015-03-11 VITALS — BP 118/68 | HR 60 | Ht 73.0 in | Wt 232.0 lb

## 2015-03-11 DIAGNOSIS — I482 Chronic atrial fibrillation, unspecified: Secondary | ICD-10-CM

## 2015-03-11 DIAGNOSIS — I10 Essential (primary) hypertension: Secondary | ICD-10-CM | POA: Diagnosis not present

## 2015-03-11 DIAGNOSIS — I5032 Chronic diastolic (congestive) heart failure: Secondary | ICD-10-CM | POA: Diagnosis not present

## 2015-03-11 DIAGNOSIS — I25810 Atherosclerosis of coronary artery bypass graft(s) without angina pectoris: Secondary | ICD-10-CM | POA: Diagnosis not present

## 2015-03-11 DIAGNOSIS — J441 Chronic obstructive pulmonary disease with (acute) exacerbation: Secondary | ICD-10-CM

## 2015-03-11 DIAGNOSIS — E785 Hyperlipidemia, unspecified: Secondary | ICD-10-CM

## 2015-03-11 DIAGNOSIS — I4821 Permanent atrial fibrillation: Secondary | ICD-10-CM

## 2015-03-11 MED ORDER — TORSEMIDE 20 MG PO TABS: 20.0000 mg | ORAL_TABLET | Freq: Two times a day (BID) | ORAL | Status: DC | PRN

## 2015-03-11 NOTE — Assessment & Plan Note (Signed)
Encouraged him to stay on his simvastatin. 

## 2015-03-11 NOTE — Assessment & Plan Note (Signed)
Appears relatively euvolemic on today's visit. He will continue to take torsemide as needed

## 2015-03-11 NOTE — Assessment & Plan Note (Signed)
Blood pressure is well controlled on today's visit. No changes made to the medications. 

## 2015-03-11 NOTE — Assessment & Plan Note (Signed)
Pulse oxygenation excellent, walking without oxygen, lungs are clear. We will discontinue his oxygen at home at his request. We have called [facility]

## 2015-03-11 NOTE — Assessment & Plan Note (Signed)
Able to afford eliquis.  We will continue on 2.5 mg twice a day given frequent falls, recent fractures Renal failure, close to 81 years old

## 2015-03-11 NOTE — Assessment & Plan Note (Signed)
Currently with no symptoms of angina. No further workup at this time. Continue current medication regimen. 

## 2015-03-11 NOTE — Progress Notes (Signed)
Patient ID: [name], male    DOB: May 20, 1933, 81 y.o.   MRN: [id number]  HPI Comments: 81 year old gentleman with a history of coronary artery disease, bypass surgery in 1999 with catheterization in 2007 with patent LIMA to the LAD, occluded vein graft to the OM, history of Cypher stent to the PL branch and mid RCA in September 07, ejection fraction 45-50%, chronic atrial fibrillation with [employer] pacemaker, hx of previous recurrent falls and injury from alcohol abuse (stopped drinking more recently), Pacemaker was placed by [facility] heart [facility] vascular in 2010 for syncope, AFib with pauses.  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 He presents for routine follow-up of his coronary artery disease and atrial fibrillation  In follow-up today, he reports that he has been doing well from a cardiac perspective In September 2016 suffered left foot fracture/ankle. This is his second ankle fracture, both from falls Currently takes torsemide as needed. Finds this works much better Robert Lasix No significant leg edema, breathing is much better Would like to send his oxygen back, most of his saturations 95% or higher even with exertion Tolerating anticoagulation, eliquis.  He is on low-dose, high fall risk  EKG on today's visit shows paced rhythm with ventricular rate 71 bpm, underlying atrial fibrillation  Other past medical history Previous hospitalization for malaise, dehydration, creatinine greater Robert 3. Lisinopril and Lasix was discontinued.   Previous echocardiogram  showed mild to moderate pulmonary hypertension. Creatinine of 2.   History of positive sleep study, was on CPAP for several months. Recent change to his insurance now has no coverage  Previous lab work from 10/23/2012 shows total cholesterol 212, LDL 142, HDL 50, creatinine 1.7, BUN 22 He is status post right knee replacement.     Allergies  Allergen Reactions  . Lipitor  [Atorvastatin] Other (See Comments)    Arm pain    Outpatient Encounter Prescriptions as of 03/11/2015  Medication Sig  . apixaban (ELIQUIS) 2.5 MG TABS tablet Take 1 tablet (2.5 mg total) by mouth 2 (two) times daily.  [name] diltiazem (CARDIZEM CD) 120 MG 24 hr capsule Take 1 capsule (120 mg total) by mouth daily.  [name] donepezil (ARICEPT) 5 MG tablet Take 5 mg by mouth at bedtime.  [name] guaiFENesin (MUCINEX) 600 MG 12 hr tablet Take 1 tablet (600 mg total) by mouth 2 (two) times daily.  [name] ipratropium-albuterol (DUONEB) 0.5-2.5 (3) MG/3ML SOLN Take 3 mLs by nebulization every 4 (four) hours as needed (for wheezing).  . metoprolol succinate (TOPROL-XL) 100 MG 24 hr tablet Take 1 tablet (100 mg total) by mouth daily.  . naproxen sodium (ANAPROX) 220 MG tablet Take 440 mg by mouth at bedtime as needed (for pain.).  [name] omeprazole (PRILOSEC) 20 MG capsule Take 20 mg by mouth daily.    . simvastatin (ZOCOR) 20 MG tablet Take 1 tablet (20 mg total) by mouth at bedtime.  . Tamsulosin HCl (FLOMAX) 0.4 MG CAPS Take 0.4 mg by mouth daily after supper.  . torsemide (DEMADEX) 20 MG tablet Take 1 tablet (20 mg total) by mouth 2 (two) times daily as needed (for weight gain).  . traZODone (DESYREL) 50 MG tablet Take 1 tablet (50 mg total) by mouth at bedtime. (Patient taking differently: Take 25 mg by mouth at bedtime. )  . [DISCONTINUED] torsemide (DEMADEX) 20 MG tablet Take 20 mg by mouth daily as needed (for weight gain).   . [DISCONTINUED] carisoprodol (SOMA) 350 MG tablet Take 1 tablet (  350 mg total) by mouth 3 (three) times daily. (Patient not taking: Reported on 03/11/2015)  . [DISCONTINUED] HYDROcodone-acetaminophen (NORCO) 7.5-325 MG tablet Take 1 tablet by mouth every 6 (six) hours. (Patient not taking: Reported on 03/11/2015)  . [DISCONTINUED] HYDROcodone-acetaminophen (NORCO) 7.5-325 MG tablet Take 1-2 tablets by mouth every 4 (four) hours as needed (breakthrough pain). (Patient not taking: Reported on  03/11/2015)   No facility-administered encounter medications on file as of 03/11/2015.    Past Medical History  Diagnosis Date  . Chronic atrial fibrillation (HCC)     a. on pradaxa  . Coronary artery disease     a. s/p MI x 3;  b. s/p CABG x2 in 1999;  c. Cath 2007: LIMA->LAD patent, VG->OM 100, PCI/DES of RPL/mid RCA (cypher DES).  . Chronic obstructive pulmonary disease ([place])   . GERD (gastroesophageal reflux disease)   . Hyperlipidemia   . Hypertension   . PVD (peripheral vascular disease) ([place])   . AAA (abdominal aortic aneurysm) ([place])     a. s/p repair - 1999 @ [facility] @ time of CABG.  . Chronic diastolic CHF (congestive heart failure) ([place])     a. 01/2012 Echo:  EF 55%.  . Tachy-brady syndrome ([place])     a. s/p PPM 2010.  [name] CKD (chronic kidney disease), stage III     a. Acute on chronic 03/2012  . Cancer ([place])     skin cancer nose  . Sleep apnea   . Broken ankle     Past Surgical History  Procedure Laterality Date  . Coronary artery bypass graft    . Abdominal aortic aneurysm repair    . Insert / replace / remove pacemaker    . Foot surgery      left foot surgery to remove cyst.  . Trigger finger release    . Skin cancer biopsy      nose  . Ankle surgery Right   . Orif ankle fracture Left 01/08/2015    Procedure: OPEN REDUCTION INTERNAL FIXATION (ORIF) ANKLE FRACTURE;  Surgeon: Earnestine Leys, MD;  Location: [facility] ORS;  Service: Orthopedics;  Laterality: Left;    Social History  reports that he quit smoking about 17 years ago. He has never used smokeless tobacco. He reports that he does not drink alcohol or use illicit drugs.  Family History family history includes Cerebral aneurysm in his father; Coronary artery disease in his father and mother; Diabetes in his father and mother; Hypertension in his father and mother; Stomach cancer in his mother.        Review of Systems  Constitutional: Negative.   Respiratory: Negative.   Cardiovascular: Negative.    Gastrointestinal: Negative.   Musculoskeletal: Positive for gait problem.  Skin: Negative.   Neurological: Negative.   Hematological: Negative.   Psychiatric/Behavioral: Negative.   All other systems reviewed and are negative.   BP 118/68 mmHg  Pulse 60  Ht 6\' 1"  (1.854 m)  Wt 232 lb (105.235 kg)  BMI 30.62 kg/m2  SpO2 98%  Physical Exam  Constitutional: He is oriented to person, place, and time. He appears well-developed and well-nourished.  HENT:  Head: Normocephalic.  Nose: Nose normal.  Mouth/Throat: Oropharynx is clear and moist.  Eyes: Conjunctivae are normal. Pupils are equal, round, and reactive to light.  Neck: Normal range of motion. Neck supple. No JVD present.  Cardiovascular: Normal rate, regular rhythm, S1 normal, S2 normal, normal heart sounds and intact distal pulses.  Exam reveals no gallop and no  friction rub.   No murmur heard. Pulmonary/Chest: Effort normal and breath sounds normal. No respiratory distress. He has no wheezes. He has no rales. He exhibits no tenderness.  Abdominal: Soft. Bowel sounds are normal. He exhibits no distension. There is no tenderness.  Musculoskeletal: Normal range of motion. He exhibits no edema or tenderness.  Boot on his left ankle  Lymphadenopathy:    He has no cervical adenopathy.  Neurological: He is alert and oriented to person, place, and time. Coordination normal.  Skin: Skin is warm and dry. No rash noted. No erythema.  Psychiatric: He has a normal mood and affect. His behavior is normal. Judgment and thought content normal.      Assessment and Plan   Nursing note and vitals reviewed.

## 2015-03-11 NOTE — Patient Instructions (Signed)
You are doing well. No medication changes were made.  Please call us if you have new issues that need to be addressed before your next appt.  Your physician wants you to follow-up in: 6 months.  You will receive a reminder letter in the mail two months in advance. If you don't receive a letter, please call our office to schedule the follow-up appointment.   

## 2015-03-13 ENCOUNTER — Encounter: Payer: Self-pay | Admitting: Internal Medicine

## 2015-03-16 DIAGNOSIS — M6281 Muscle weakness (generalized): Secondary | ICD-10-CM | POA: Diagnosis not present

## 2015-03-16 DIAGNOSIS — I2510 Atherosclerotic heart disease of native coronary artery without angina pectoris: Secondary | ICD-10-CM | POA: Diagnosis not present

## 2015-03-16 DIAGNOSIS — I739 Peripheral vascular disease, unspecified: Secondary | ICD-10-CM | POA: Diagnosis not present

## 2015-03-16 DIAGNOSIS — N189 Chronic kidney disease, unspecified: Secondary | ICD-10-CM | POA: Diagnosis not present

## 2015-03-16 DIAGNOSIS — I5032 Chronic diastolic (congestive) heart failure: Secondary | ICD-10-CM | POA: Diagnosis not present

## 2015-03-16 DIAGNOSIS — J449 Chronic obstructive pulmonary disease, unspecified: Secondary | ICD-10-CM | POA: Diagnosis not present

## 2015-03-16 DIAGNOSIS — I480 Paroxysmal atrial fibrillation: Secondary | ICD-10-CM | POA: Diagnosis not present

## 2015-03-16 DIAGNOSIS — S82842D Displaced bimalleolar fracture of left lower leg, subsequent encounter for closed fracture with routine healing: Secondary | ICD-10-CM | POA: Diagnosis not present

## 2015-03-16 DIAGNOSIS — I130 Hypertensive heart and chronic kidney disease with heart failure and stage 1 through stage 4 chronic kidney disease, or unspecified chronic kidney disease: Secondary | ICD-10-CM | POA: Diagnosis not present

## 2015-03-19 DIAGNOSIS — I739 Peripheral vascular disease, unspecified: Secondary | ICD-10-CM | POA: Diagnosis not present

## 2015-03-19 DIAGNOSIS — S82842D Displaced bimalleolar fracture of left lower leg, subsequent encounter for closed fracture with routine healing: Secondary | ICD-10-CM | POA: Diagnosis not present

## 2015-03-19 DIAGNOSIS — J449 Chronic obstructive pulmonary disease, unspecified: Secondary | ICD-10-CM | POA: Diagnosis not present

## 2015-03-19 DIAGNOSIS — I130 Hypertensive heart and chronic kidney disease with heart failure and stage 1 through stage 4 chronic kidney disease, or unspecified chronic kidney disease: Secondary | ICD-10-CM | POA: Diagnosis not present

## 2015-03-19 DIAGNOSIS — I2510 Atherosclerotic heart disease of native coronary artery without angina pectoris: Secondary | ICD-10-CM | POA: Diagnosis not present

## 2015-03-19 DIAGNOSIS — I5032 Chronic diastolic (congestive) heart failure: Secondary | ICD-10-CM | POA: Diagnosis not present

## 2015-03-19 DIAGNOSIS — I480 Paroxysmal atrial fibrillation: Secondary | ICD-10-CM | POA: Diagnosis not present

## 2015-03-19 DIAGNOSIS — M6281 Muscle weakness (generalized): Secondary | ICD-10-CM | POA: Diagnosis not present

## 2015-03-19 DIAGNOSIS — N189 Chronic kidney disease, unspecified: Secondary | ICD-10-CM | POA: Diagnosis not present

## 2015-03-23 DIAGNOSIS — S82842D Displaced bimalleolar fracture of left lower leg, subsequent encounter for closed fracture with routine healing: Secondary | ICD-10-CM | POA: Diagnosis not present

## 2015-03-23 DIAGNOSIS — I739 Peripheral vascular disease, unspecified: Secondary | ICD-10-CM | POA: Diagnosis not present

## 2015-03-23 DIAGNOSIS — I2510 Atherosclerotic heart disease of native coronary artery without angina pectoris: Secondary | ICD-10-CM | POA: Diagnosis not present

## 2015-03-23 DIAGNOSIS — J449 Chronic obstructive pulmonary disease, unspecified: Secondary | ICD-10-CM | POA: Diagnosis not present

## 2015-03-23 DIAGNOSIS — I130 Hypertensive heart and chronic kidney disease with heart failure and stage 1 through stage 4 chronic kidney disease, or unspecified chronic kidney disease: Secondary | ICD-10-CM | POA: Diagnosis not present

## 2015-03-23 DIAGNOSIS — M6281 Muscle weakness (generalized): Secondary | ICD-10-CM | POA: Diagnosis not present

## 2015-03-23 DIAGNOSIS — I5032 Chronic diastolic (congestive) heart failure: Secondary | ICD-10-CM | POA: Diagnosis not present

## 2015-03-23 DIAGNOSIS — N189 Chronic kidney disease, unspecified: Secondary | ICD-10-CM | POA: Diagnosis not present

## 2015-03-23 DIAGNOSIS — I480 Paroxysmal atrial fibrillation: Secondary | ICD-10-CM | POA: Diagnosis not present

## 2015-03-27 DIAGNOSIS — J449 Chronic obstructive pulmonary disease, unspecified: Secondary | ICD-10-CM | POA: Diagnosis not present

## 2015-03-27 DIAGNOSIS — I5032 Chronic diastolic (congestive) heart failure: Secondary | ICD-10-CM | POA: Diagnosis not present

## 2015-03-27 DIAGNOSIS — I739 Peripheral vascular disease, unspecified: Secondary | ICD-10-CM | POA: Diagnosis not present

## 2015-03-27 DIAGNOSIS — N189 Chronic kidney disease, unspecified: Secondary | ICD-10-CM | POA: Diagnosis not present

## 2015-03-27 DIAGNOSIS — I480 Paroxysmal atrial fibrillation: Secondary | ICD-10-CM | POA: Diagnosis not present

## 2015-03-27 DIAGNOSIS — I130 Hypertensive heart and chronic kidney disease with heart failure and stage 1 through stage 4 chronic kidney disease, or unspecified chronic kidney disease: Secondary | ICD-10-CM | POA: Diagnosis not present

## 2015-03-27 DIAGNOSIS — S82842D Displaced bimalleolar fracture of left lower leg, subsequent encounter for closed fracture with routine healing: Secondary | ICD-10-CM | POA: Diagnosis not present

## 2015-03-27 DIAGNOSIS — I2510 Atherosclerotic heart disease of native coronary artery without angina pectoris: Secondary | ICD-10-CM | POA: Diagnosis not present

## 2015-03-27 DIAGNOSIS — M6281 Muscle weakness (generalized): Secondary | ICD-10-CM | POA: Diagnosis not present

## 2015-03-31 DIAGNOSIS — S82842D Displaced bimalleolar fracture of left lower leg, subsequent encounter for closed fracture with routine healing: Secondary | ICD-10-CM | POA: Diagnosis not present

## 2015-04-01 DIAGNOSIS — I5032 Chronic diastolic (congestive) heart failure: Secondary | ICD-10-CM | POA: Diagnosis not present

## 2015-04-01 DIAGNOSIS — I509 Heart failure, unspecified: Secondary | ICD-10-CM | POA: Diagnosis not present

## 2015-04-01 DIAGNOSIS — R2681 Unsteadiness on feet: Secondary | ICD-10-CM | POA: Diagnosis not present

## 2015-04-01 DIAGNOSIS — J449 Chronic obstructive pulmonary disease, unspecified: Secondary | ICD-10-CM | POA: Diagnosis not present

## 2015-04-01 DIAGNOSIS — S82842A Displaced bimalleolar fracture of left lower leg, initial encounter for closed fracture: Secondary | ICD-10-CM | POA: Diagnosis not present

## 2015-05-02 DIAGNOSIS — I5032 Chronic diastolic (congestive) heart failure: Secondary | ICD-10-CM | POA: Diagnosis not present

## 2015-05-02 DIAGNOSIS — S82842A Displaced bimalleolar fracture of left lower leg, initial encounter for closed fracture: Secondary | ICD-10-CM | POA: Diagnosis not present

## 2015-05-02 DIAGNOSIS — I509 Heart failure, unspecified: Secondary | ICD-10-CM | POA: Diagnosis not present

## 2015-05-02 DIAGNOSIS — R2681 Unsteadiness on feet: Secondary | ICD-10-CM | POA: Diagnosis not present

## 2015-05-02 DIAGNOSIS — J449 Chronic obstructive pulmonary disease, unspecified: Secondary | ICD-10-CM | POA: Diagnosis not present

## 2015-05-12 DIAGNOSIS — Z0100 Encounter for examination of eyes and vision without abnormal findings: Secondary | ICD-10-CM | POA: Diagnosis not present

## 2015-05-12 DIAGNOSIS — E7800 Pure hypercholesterolemia, unspecified: Secondary | ICD-10-CM | POA: Diagnosis not present

## 2015-05-12 DIAGNOSIS — I10 Essential (primary) hypertension: Secondary | ICD-10-CM | POA: Diagnosis not present

## 2015-06-02 DIAGNOSIS — S82842A Displaced bimalleolar fracture of left lower leg, initial encounter for closed fracture: Secondary | ICD-10-CM | POA: Diagnosis not present

## 2015-06-02 DIAGNOSIS — J449 Chronic obstructive pulmonary disease, unspecified: Secondary | ICD-10-CM | POA: Diagnosis not present

## 2015-06-02 DIAGNOSIS — R2681 Unsteadiness on feet: Secondary | ICD-10-CM | POA: Diagnosis not present

## 2015-06-02 DIAGNOSIS — I509 Heart failure, unspecified: Secondary | ICD-10-CM | POA: Diagnosis not present

## 2015-06-02 DIAGNOSIS — I5032 Chronic diastolic (congestive) heart failure: Secondary | ICD-10-CM | POA: Diagnosis not present

## 2015-06-10 DIAGNOSIS — I10 Essential (primary) hypertension: Secondary | ICD-10-CM | POA: Diagnosis not present

## 2015-06-10 DIAGNOSIS — J309 Allergic rhinitis, unspecified: Secondary | ICD-10-CM | POA: Diagnosis not present

## 2015-06-10 DIAGNOSIS — R05 Cough: Secondary | ICD-10-CM | POA: Diagnosis not present

## 2015-06-10 DIAGNOSIS — N183 Chronic kidney disease, stage 3 (moderate): Secondary | ICD-10-CM | POA: Diagnosis not present

## 2015-06-30 DIAGNOSIS — I509 Heart failure, unspecified: Secondary | ICD-10-CM | POA: Diagnosis not present

## 2015-06-30 DIAGNOSIS — I5032 Chronic diastolic (congestive) heart failure: Secondary | ICD-10-CM | POA: Diagnosis not present

## 2015-06-30 DIAGNOSIS — S82842A Displaced bimalleolar fracture of left lower leg, initial encounter for closed fracture: Secondary | ICD-10-CM | POA: Diagnosis not present

## 2015-06-30 DIAGNOSIS — J449 Chronic obstructive pulmonary disease, unspecified: Secondary | ICD-10-CM | POA: Diagnosis not present

## 2015-06-30 DIAGNOSIS — R2681 Unsteadiness on feet: Secondary | ICD-10-CM | POA: Diagnosis not present

## 2015-07-31 DIAGNOSIS — R2681 Unsteadiness on feet: Secondary | ICD-10-CM | POA: Diagnosis not present

## 2015-07-31 DIAGNOSIS — I5032 Chronic diastolic (congestive) heart failure: Secondary | ICD-10-CM | POA: Diagnosis not present

## 2015-07-31 DIAGNOSIS — I509 Heart failure, unspecified: Secondary | ICD-10-CM | POA: Diagnosis not present

## 2015-07-31 DIAGNOSIS — J449 Chronic obstructive pulmonary disease, unspecified: Secondary | ICD-10-CM | POA: Diagnosis not present

## 2015-07-31 DIAGNOSIS — S82842A Displaced bimalleolar fracture of left lower leg, initial encounter for closed fracture: Secondary | ICD-10-CM | POA: Diagnosis not present

## 2015-08-04 ENCOUNTER — Ambulatory Visit (INDEPENDENT_AMBULATORY_CARE_PROVIDER_SITE_OTHER): Payer: Commercial Managed Care - HMO | Admitting: Internal Medicine

## 2015-08-04 ENCOUNTER — Encounter: Payer: Self-pay | Admitting: Internal Medicine

## 2015-08-04 VITALS — BP 138/82 | HR 82 | Ht 73.0 in | Wt 239.5 lb

## 2015-08-04 DIAGNOSIS — Z950 Presence of cardiac pacemaker: Secondary | ICD-10-CM | POA: Diagnosis not present

## 2015-08-04 DIAGNOSIS — R079 Chest pain, unspecified: Secondary | ICD-10-CM | POA: Diagnosis not present

## 2015-08-04 DIAGNOSIS — R0602 Shortness of breath: Secondary | ICD-10-CM

## 2015-08-04 NOTE — Progress Notes (Signed)
ELECTROPHYSIOLOGY CONSULT NOTE  Patient ID: [name], MRN: [id number], DOB/AGE: 80-26-1935 80 y.o. Admit date: (Not on file) Date of Consult: 08/04/2015  Primary Physician: Robert Pink, Kennedy Primary Cardiologist: [name] Consulting Physician [name]  Chief Complaint: TO ESTTABLISH pACEMAKER FOLLOWUP    HPI [name] is a 82 y.o. male  Seen to establish pacemaker follow-up. He has a history of permanent atrial fibrillation.  He takes apixaban 2.5 mg daily  His history of coronary artery disease with I past surgery 1999 catheterization 2007 demonstrating patent LIMA and an occluded vein graft to the OM. He also has a history of Cypher stenting. Ejection fraction at that time was 45-50%.  Echocardiogram 2015 EF 55-65% with mild LAE and moderate pulmonary hypertension Myoview 6/15 demonstrated normal EF and no ischemia; no infarction  He also has renal insufficiency. This has restricted the use of his NSAIDs and his temperature use of his diuretics.  He finds that when he takes his torsemide, there is a significant improvement in dyspnea.  He denies exertional chest pain      Past Medical History  Diagnosis Date  . Chronic atrial fibrillation (HCC)     a. on pradaxa  . Coronary artery disease     a. s/p MI x 3;  b. s/p CABG x2 in 1999;  c. Cath 2007: LIMA->LAD patent, VG->OM 100, PCI/DES of RPL/mid RCA (cypher DES).  . Chronic obstructive pulmonary disease ([place])   . GERD (gastroesophageal reflux disease)   . Hyperlipidemia   . Hypertension   . PVD (peripheral vascular disease) ([place])   . AAA (abdominal aortic aneurysm) ([place])     a. s/p repair - 1999 @ [facility] @ time of CABG.  . Chronic diastolic CHF (congestive heart failure) ([place])     a. 01/2012 Echo:  EF 55%.  . Tachy-brady syndrome ([place])     a. s/p PPM 2010.  [name] CKD (chronic kidney disease), stage III     a. Acute on chronic 03/2012  . Cancer ([place])     skin cancer nose  . Sleep apnea   . Broken ankle        Surgical History:  Past Surgical History  Procedure Laterality Date  . Coronary artery bypass graft    . Abdominal aortic aneurysm repair    . Insert / replace / remove pacemaker    . Foot surgery      left foot surgery to remove cyst.  . Trigger finger release    . Skin cancer biopsy      nose  . Ankle surgery Right   . Orif ankle fracture Left 01/08/2015    Procedure: OPEN REDUCTION INTERNAL FIXATION (ORIF) ANKLE FRACTURE;  Surgeon: [name];  Location: [facility] ORS;  Service: Orthopedics;  Laterality: Left;     Home Meds: Prior to Admission medications   Medication Sig Start Date End Date Taking? Authorizing Provider  Acetaminophen (TYLENOL PO) Take by mouth as needed.   Yes [name]  apixaban (ELIQUIS) 2.5 MG TABS tablet Take 1 tablet (2.5 mg total) by mouth 2 (two) times daily. 09/03/14  Yes Robert Merritts, Kennedy  diltiazem (CARDIZEM CD) 120 MG 24 hr capsule Take 1 capsule (120 mg total) by mouth daily. 07/15/14  Yes Robert Sprang, Kennedy  donepezil (ARICEPT) 5 MG tablet Take 5 mg by mouth at bedtime.   Yes [name]  guaiFENesin (MUCINEX) 600 MG 12 hr tablet Take 1 tablet (600 mg  total) by mouth 2 (two) times daily. 08/25/14  Yes [name]  metoprolol succinate (TOPROL-XL) 100 MG 24 hr tablet Take 1 tablet (100 mg total) by mouth daily. 06/03/13  Yes Robert Sprang, Kennedy  omeprazole (PRILOSEC) 20 MG capsule Take 20 mg by mouth daily.     Yes [name]  simvastatin (ZOCOR) 20 MG tablet Take 1 tablet (20 mg total) by mouth at bedtime. 11/13/13  Yes Robert Merritts, Kennedy  Tamsulosin HCl (FLOMAX) 0.4 MG CAPS Take 0.4 mg by mouth daily after supper.   Yes [name]  torsemide (DEMADEX) 20 MG tablet Take 1 tablet (20 mg total) by mouth 2 (two) times daily as needed (for weight gain). 03/11/15  Yes Robert Merritts, Kennedy  traZODone (DESYREL) 50 MG tablet Take 1 tablet (50 mg total) by mouth at bedtime. Patient taking  differently: Take 25 mg by mouth at bedtime.  09/03/14  Yes Robert Merritts, Kennedy    Allergies:  Allergies  Allergen Reactions  . Lipitor [Atorvastatin] Other (See Comments)    Arm pain    Social History   Social History  . Marital Status: Married    Spouse Name: N/A  . Number of Children: N/A  . Years of Education: N/A   Occupational History  . Not on file.   Social History Main Topics  . Smoking status: Former Smoker -- 1.00 packs/day for 20 years    Quit date: 04/11/1997  . Smokeless tobacco: Never Used  . Alcohol Use: No     Comment: Rare  . Drug Use: No  . Sexual Activity: Not on file   Other Topics Concern  . Not on file   Social History Narrative   Lives locally with his wife.  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       Family History  Problem Relation Age of Onset  . Stomach cancer Mother     died @ 44  . Diabetes Mother   . Hypertension Mother   . Coronary artery disease Mother   . Cerebral aneurysm Father     died @ 53  . Diabetes Father   . Hypertension Father   . Coronary artery disease Father      ROS:  Please see the history of present illness.     All other systems reviewed and negative.    Physical Exam: Blood pressure 138/82, pulse 82, height 6\' 1"  (1.854 m), weight 239 lb 8 oz (108.636 kg). General: Well developed, well nourished male in no acute distress. Head: Normocephalic, atraumatic, sclera non-icteric, no xanthomas, nares are without discharge. EENT: normal  Lymph Nodes:  none Neck: Negative for carotid bruits. JVD not elevated. Back:without scoliosis kyphosis  Lungs: Clear bilaterally to auscultation without wheezes, rales, or rhonchi. Breathing is unlabored. Heart: iRREGUARLY iRR with S1 S2. 2*/6 systolic  murmur . No rubs, or gallops appreciated. Abdomen: Soft, non-tender, non-distended with normoactive bowel sounds. No hepatomegaly. No rebound/guarding. No obvious abdominal masses. Msk:  Strength and tone appear  normal for age. Extremities: No clubbing or cyanosis. TR edema.  Distal pedal pulses are 2+ and equal bilaterally. Skin: Warm and Dry Neuro: Alert and oriented X 3. CN III-XII intact Grossly normal sensory and motor function . Psych:  Responds to questions appropriately with a normal affect.      Labs: Cardiac Enzymes No results for input(s): CKTOTAL, CKMB, TROPONINI in the last 72 hours. CBC Lab Results  Component Value Date  WBC 10.3 02/15/2015   HGB 12.8* 02/15/2015   HCT 39.7* 02/15/2015   MCV 91.8 02/15/2015   PLT 175 02/15/2015   PROTIME: No results for input(s): LABPROT, INR in the last 72 hours. Chemistry No results for input(s): NA, K, CL, CO2, BUN, CREATININE, CALCIUM, PROT, BILITOT, ALKPHOS, ALT, AST, GLUCOSE in the last 168 hours.  Invalid input(s): LABALBU Lipids Lab Results  Component Value Date   CHOL 140 08/06/2014   HDL 53 08/06/2014   LDLCALC 69 08/06/2014   TRIG 90 08/06/2014   BNP PRO B NATRIURETIC PEPTIDE (BNP)  Date/Time Value Ref Range Status  05/26/2010 09:54 AM 289.0* 0.0 - 100.0 pg/mL Final   Thyroid Function Tests: No results for input(s): TSH, T4TOTAL, T3FREE, THYROIDAB in the last 72 hours.  Invalid input(s): FREET3 Miscellaneous No results found for: DDIMER  Radiology/Studies:  No results found.  EKG Ventricular pacing with suggestion of underlying sinus rhythm. ECG was checked from 12/16 demonstrated underlying atrial fibrillation   Assessment and Plan:  Atrial fibrillation-persistent/permanent  Bradycardia with 82% ventricular pacing  Dyspnea on exertion/HFpEF  Pacemaker-Medtronic approaching ERI   The patient has a pacemaker approaching ERI we have reviewed a protocol for establishing remote connectivity. We will see him in 3 months. At this point we will not reprogram his device.  He has HFpEF. He responds daily to diuretics. We will increase his diuretic to take it every other day for 2 weeks. He is to see Dr. Deidre Ala a  couple of weeks after that. We will allow him to further modify his diuretic regime in the context of his known renal insufficiency; last creatinine 2.3 on 06/10/15   He is currently taking apixaban at low dose. This is appropriate given his age(turns 82 this week) with creatinine greater than 1.5  With worsening dyspnea on exertion we will obtain an echocardiogram; prior studies Had showed normal LV function.   [name]

## 2015-08-04 NOTE — Patient Instructions (Signed)
Medication Instructions: 1) Take torsemide (demadex) 20 mg one tablet by mouth once every other day x 2 weeks  Labwork: - Your physician recommends that you return for lab work in: 2 weeks - BMP  Procedures/Testing: - Your physician has requested that you have an echocardiogram- on 09/08/15 if possible- seeing Dr. Rockey Situ the same day. Echocardiography is a painless test that uses sound waves to create images of your heart. It provides your doctor with information about the size and shape of your heart and how well your heart's chambers and valves are working. This procedure takes approximately one hour. There are no restrictions for this procedure.  Follow-Up: - Your physician recommends that you schedule a follow-up appointment in: 3 months with the device clinic (bring your transmitter and adapter when you come in to see Raquel Sarna)  Any Additional Special Instructions Will Be Listed Below (If Applicable).   If you need a refill on your cardiac medications before your next appointment, please call your pharmacy.

## 2015-08-05 LAB — CUP PACEART INCLINIC DEVICE CHECK
Battery Voltage: 2.71 V
Date Time Interrogation Session: 20170425144335
Implantable Lead Implant Date: 20100205
Implantable Lead Model: 5076
Lead Channel Impedance Value: 0 Ohm
Lead Channel Pacing Threshold Amplitude: 1 V
Lead Channel Pacing Threshold Pulse Width: 0.4 ms
Lead Channel Sensing Intrinsic Amplitude: 15.67 mV
Lead Channel Setting Pacing Amplitude: 2.5 V
MDC IDC LEAD LOCATION: 753860
MDC IDC MSMT BATTERY IMPEDANCE: 3699 Ohm
MDC IDC MSMT BATTERY REMAINING LONGEVITY: 15 mo
MDC IDC MSMT LEADCHNL RV IMPEDANCE VALUE: 436 Ohm
MDC IDC SET LEADCHNL RV PACING PULSEWIDTH: 0.4 ms
MDC IDC SET LEADCHNL RV SENSING SENSITIVITY: 5.6 mV
MDC IDC STAT BRADY RV PERCENT PACED: 82 %

## 2015-08-11 ENCOUNTER — Encounter: Payer: Self-pay | Admitting: Cardiology

## 2015-08-18 ENCOUNTER — Telehealth: Payer: Self-pay | Admitting: Cardiovascular Disease

## 2015-08-18 ENCOUNTER — Emergency Department
Admission: EM | Admit: 2015-08-18 | Discharge: 2015-08-19 | Disposition: A | Discharge status: Home / Self Care | Payer: Commercial Managed Care - HMO | Attending: Emergency Medicine | Admitting: Emergency Medicine

## 2015-08-18 ENCOUNTER — Other Ambulatory Visit (INDEPENDENT_AMBULATORY_CARE_PROVIDER_SITE_OTHER): Payer: Commercial Managed Care - HMO | Admitting: *Deleted

## 2015-08-18 DIAGNOSIS — I130 Hypertensive heart and chronic kidney disease with heart failure and stage 1 through stage 4 chronic kidney disease, or unspecified chronic kidney disease: Secondary | ICD-10-CM | POA: Diagnosis not present

## 2015-08-18 DIAGNOSIS — N183 Chronic kidney disease, stage 3 unspecified: Secondary | ICD-10-CM | POA: Diagnosis present

## 2015-08-18 DIAGNOSIS — F322 Major depressive disorder, single episode, severe without psychotic features: Secondary | ICD-10-CM | POA: Insufficient documentation

## 2015-08-18 DIAGNOSIS — Z85828 Personal history of other malignant neoplasm of skin: Secondary | ICD-10-CM | POA: Insufficient documentation

## 2015-08-18 DIAGNOSIS — Z950 Presence of cardiac pacemaker: Secondary | ICD-10-CM | POA: Diagnosis not present

## 2015-08-18 DIAGNOSIS — Z8679 Personal history of other diseases of the circulatory system: Secondary | ICD-10-CM | POA: Diagnosis not present

## 2015-08-18 DIAGNOSIS — E785 Hyperlipidemia, unspecified: Secondary | ICD-10-CM | POA: Insufficient documentation

## 2015-08-18 DIAGNOSIS — Z951 Presence of aortocoronary bypass graft: Secondary | ICD-10-CM | POA: Insufficient documentation

## 2015-08-18 DIAGNOSIS — I4891 Unspecified atrial fibrillation: Secondary | ICD-10-CM | POA: Diagnosis present

## 2015-08-18 DIAGNOSIS — I509 Heart failure, unspecified: Secondary | ICD-10-CM

## 2015-08-18 DIAGNOSIS — R0602 Shortness of breath: Secondary | ICD-10-CM | POA: Diagnosis not present

## 2015-08-18 DIAGNOSIS — F1010 Alcohol abuse, uncomplicated: Secondary | ICD-10-CM

## 2015-08-18 DIAGNOSIS — I5032 Chronic diastolic (congestive) heart failure: Secondary | ICD-10-CM | POA: Diagnosis not present

## 2015-08-18 DIAGNOSIS — I959 Hypotension, unspecified: Secondary | ICD-10-CM | POA: Diagnosis not present

## 2015-08-18 DIAGNOSIS — Z87891 Personal history of nicotine dependence: Secondary | ICD-10-CM | POA: Diagnosis not present

## 2015-08-18 DIAGNOSIS — I25810 Atherosclerosis of coronary artery bypass graft(s) without angina pectoris: Secondary | ICD-10-CM | POA: Diagnosis present

## 2015-08-18 DIAGNOSIS — K219 Gastro-esophageal reflux disease without esophagitis: Secondary | ICD-10-CM | POA: Diagnosis present

## 2015-08-18 DIAGNOSIS — I2510 Atherosclerotic heart disease of native coronary artery without angina pectoris: Secondary | ICD-10-CM | POA: Insufficient documentation

## 2015-08-18 DIAGNOSIS — F329 Major depressive disorder, single episode, unspecified: Secondary | ICD-10-CM | POA: Diagnosis not present

## 2015-08-18 DIAGNOSIS — Z79899 Other long term (current) drug therapy: Secondary | ICD-10-CM | POA: Diagnosis not present

## 2015-08-18 DIAGNOSIS — R45851 Suicidal ideations: Secondary | ICD-10-CM

## 2015-08-18 DIAGNOSIS — J449 Chronic obstructive pulmonary disease, unspecified: Secondary | ICD-10-CM | POA: Diagnosis not present

## 2015-08-18 DIAGNOSIS — I10 Essential (primary) hypertension: Secondary | ICD-10-CM | POA: Diagnosis present

## 2015-08-18 LAB — ETHANOL: Alcohol, Ethyl (B): 185 mg/dL — ABNORMAL HIGH (ref <5)

## 2015-08-18 LAB — ACETAMINOPHEN LEVEL: Acetaminophen (Tylenol), Serum: <10 ug/mL — ABNORMAL LOW (ref 10–30)

## 2015-08-18 LAB — CBC
HEMATOCRIT: 39.1 % — AB (ref 40.0–52.0)
HEMOGLOBIN: 12.6 g/dL — AB (ref 13.0–18.0)
MCH: 29.8 pg (ref 26.0–34.0)
MCHC: 32.1 g/dL (ref 32.0–36.0)
MCV: 92.9 fL (ref 80.0–100.0)
Platelets: 180 10*3/uL (ref 150–440)
RBC: 4.21 MIL/uL — AB (ref 4.40–5.90)
RDW: 15.1 % — AB (ref 11.5–14.5)
WBC: 8.2 10*3/uL (ref 3.8–10.6)

## 2015-08-18 LAB — URINE DRUG SCREEN, QUALITATIVE (ARMC ONLY)
Amphetamines, Ur Screen: NOT DETECTED
BARBITURATES, UR SCREEN: NOT DETECTED
Benzodiazepine, Ur Scrn: NOT DETECTED
COCAINE METABOLITE, UR ~~LOC~~: NOT DETECTED
Cannabinoid 50 Ng, Ur ~~LOC~~: NOT DETECTED
MDMA (Ecstasy)Ur Screen: NOT DETECTED
METHADONE SCREEN, URINE: NOT DETECTED
OPIATE, UR SCREEN: NOT DETECTED
PHENCYCLIDINE (PCP) UR S: NOT DETECTED
Tricyclic, Ur Screen: NOT DETECTED

## 2015-08-18 LAB — TROPONIN I: Troponin I: 0.03 ng/mL (ref <0.031)

## 2015-08-18 LAB — COMPREHENSIVE METABOLIC PANEL
ALBUMIN: 3.8 g/dL (ref 3.5–5.0)
ALK PHOS: 61 U/L (ref 38–126)
ALT: 15 U/L — AB (ref 17–63)
ANION GAP: 10 (ref 5–15)
AST: 20 U/L (ref 15–41)
BUN: 42 mg/dL — AB (ref 6–20)
CALCIUM: 8.8 mg/dL — AB (ref 8.9–10.3)
CO2: 25 mmol/L (ref 22–32)
CREATININE: 3.24 mg/dL — AB (ref 0.61–1.24)
Chloride: 107 mmol/L (ref 101–111)
GFR calc Af Amer: 19 mL/min — ABNORMAL LOW (ref >60)
GFR calc non Af Amer: 17 mL/min — ABNORMAL LOW (ref >60)
GLUCOSE: 86 mg/dL (ref 65–99)
Potassium: 4.2 mmol/L (ref 3.5–5.1)
SODIUM: 142 mmol/L (ref 135–145)
Total Bilirubin: 0.6 mg/dL (ref 0.3–1.2)
Total Protein: 7 g/dL (ref 6.5–8.1)

## 2015-08-18 LAB — SALICYLATE LEVEL

## 2015-08-18 MED ORDER — TAMSULOSIN HCL 0.4 MG PO CAPS
0.4000 mg | ORAL_CAPSULE | Freq: Every day | ORAL | Status: DC
  Administered 2015-08-18 – 2015-08-19 (×2): 0.4 mg via ORAL
  Filled 2015-08-18 (×2): qty 1

## 2015-08-18 MED ORDER — APIXABAN 2.5 MG PO TABS
2.5000 mg | ORAL_TABLET | Freq: Two times a day (BID) | ORAL | Status: DC
  Administered 2015-08-18 – 2015-08-19 (×2): 2.5 mg via ORAL
  Filled 2015-08-18 (×4): qty 1

## 2015-08-18 MED ORDER — DILTIAZEM HCL ER COATED BEADS 120 MG PO CP24
120.0000 mg | ORAL_CAPSULE | Freq: Every day | ORAL | Status: DC
  Administered 2015-08-18 – 2015-08-19 (×2): 120 mg via ORAL
  Filled 2015-08-18 (×2): qty 1

## 2015-08-18 MED ORDER — METOPROLOL SUCCINATE ER 50 MG PO TB24
100.0000 mg | ORAL_TABLET | Freq: Every day | ORAL | Status: DC
  Administered 2015-08-18 – 2015-08-19 (×2): 100 mg via ORAL
  Filled 2015-08-18 (×2): qty 2

## 2015-08-18 MED ORDER — SIMVASTATIN 40 MG PO TABS
20.0000 mg | ORAL_TABLET | Freq: Every day | ORAL | Status: DC
  Administered 2015-08-19: 20 mg via ORAL
  Filled 2015-08-18: qty 1

## 2015-08-18 MED ORDER — PANTOPRAZOLE SODIUM 40 MG PO TBEC
40.0000 mg | DELAYED_RELEASE_TABLET | Freq: Every day | ORAL | Status: DC
  Administered 2015-08-18 – 2015-08-19 (×2): 40 mg via ORAL
  Filled 2015-08-18 (×2): qty 1

## 2015-08-18 MED ORDER — DONEPEZIL HCL 5 MG PO TABS
5.0000 mg | ORAL_TABLET | Freq: Every day | ORAL | Status: DC
  Administered 2015-08-18: 5 mg via ORAL
  Filled 2015-08-18: qty 1

## 2015-08-18 MED ORDER — ESCITALOPRAM OXALATE 10 MG PO TABS
10.0000 mg | ORAL_TABLET | Freq: Every day | ORAL | Status: DC
  Administered 2015-08-18 – 2015-08-19 (×2): 10 mg via ORAL
  Filled 2015-08-18 (×2): qty 1

## 2015-08-18 MED ORDER — MIRTAZAPINE 15 MG PO TABS
7.5000 mg | ORAL_TABLET | Freq: Every day | ORAL | Status: DC
  Administered 2015-08-18: 7.5 mg via ORAL
  Filled 2015-08-18: qty 1

## 2015-08-18 MED ORDER — THIAMINE HCL 100 MG/ML IJ SOLN
Freq: Once | INTRAVENOUS | Status: AC
  Administered 2015-08-18: 18:00:00 via INTRAVENOUS
  Filled 2015-08-18: qty 1000

## 2015-08-18 NOTE — ED Notes (Signed)
MD has seen patient, per her recommendation patient does not need sitter at this time. Family and visitors are at the bedside.

## 2015-08-18 NOTE — Telephone Encounter (Signed)
Spoke w/ pt's wife.  She reports that their son is dying in the hospital of cancer, they are under a lot of stress and she is crying on the phone.  She inquires how pt was acting when he was here for lab draw this am.  Spoke w/ Lenda Kelp, CMA who drew his blood.  She reports that pt was fine, he was talking during his appt and was in NAD. Pt's wife reports that when she came home around 11am, pt was sitting in his chair, will not follow instructions, speech is slurred and he seems disoriented.  Asked her to have pt smile, but he will not do so.  Asked her to ask pt a question to see if he can answer appropriately, but she reports that he cannot speak.  Advised her to hang up and call 911. She is crying, stating that she does not want to do this, as it will make pt mad. She states that she "just can't handle anymore right now". Firmly instructed her to call 911 and after explaining the importance of immediate attention for her husband, she is agreeable.  Asked her to call back if we can be of further assistance.

## 2015-08-18 NOTE — ED Notes (Signed)
EMS called out for weakness. Found patient to be severly depressed due to son's imminent death. States he only has days to live and wants to die too. Wife is unaware.

## 2015-08-18 NOTE — Consult Note (Signed)
[facility] Consult   Reason for Consult:  Consult for 82 year old man brought here on referral from his cardiologist with severe depression and suicidal thoughts. Referring Physician:  Karma Greaser [name] MRN:  [id number] Principal Diagnosis: Depression, major, single episode, severe ([place]) Diagnosis:   Patient Active Problem List   Diagnosis Date Noted  . Depression, major, single episode, severe ([place]) [F32.2] 08/18/2015  . Alcohol abuse [F10.10] 08/18/2015  . Suicidal ideation [R45.851] 08/18/2015  . Bimalleolar fracture of left ankle [S82.842A] 01/07/2015  . COPD exacerbation ([place]) [J44.1] 08/22/2014  . Coronary artery disease [I25.10]   . Chronic atrial fibrillation ([place]) [I48.2]   . Chronic diastolic CHF (congestive heart failure) ([place]) [I50.32]   . Tachy-brady syndrome ([place]) [I49.5]   . CKD (chronic kidney disease), stage III [N18.3] 04/09/2012  . Chronic diastolic heart failure ([place]) [I50.32] 01/26/2012  . Chest pain [786.5] 09/23/2010  . Hyperlipidemia [E78.5] 06/12/2009  . Essential hypertension [I10] 06/12/2009  . Coronary atherosclerosis of artery bypass graft [I25.810] 06/12/2009  . ATRIAL FIBRILLATION [I48.91] 06/12/2009  . AAA [I71.4] 06/12/2009  . COPD [J44.9] 06/12/2009  . GERD [K21.9] 06/12/2009  . PACEMAKER, PERMANENT [Z95.0] 06/12/2009    Total Time spent with patient: 1 hour  Subjective:   [name] is a 82 y.o. male patient admitted with "yes I am depressed".  HPI:  Patient interviewed. At his consent his sisters were present also and he still appeared to be very forthcoming. Chart reviewed including medical notes. Case discussed with emergency room physician. 82 year old man with multiple medical problems reports that he has been depressed probably for months but things of been getting worse for the last couple weeks. His mood feels bad all the time. He feels sad down and hopeless and irritable. Feels like he  has nothing at all to live for in his life. Sleeping poorly at night he frequently tosses and turns all night. His appetite has been diminished for the last couple weeks. He has no enjoyment in anything. He is starting to have crying spells and also worsening irritability. Patient endorses some hallucinations although I'm not sure whether they represent true psychosis. He says that at night sometimes he will hear voices when he falls asleep and will wake up talking to them. He says on some occasions he has seen some odd things but they're hard to describe. Patient has begun having thoughts about wanting to die. He does not describe any specific plan. His family however has been concerned enough that his wife has removed the hand guns from the house. Additionally patient admits that he has been drinking more recently than he used to. He says he only drinks about 2 days a week but when he does he drinks a couple large swallows of liquor. Because his wife disapproves of drinking he hides it from her but he feels it's the only way he can get to sleep. Patient is not receiving any kind of psychiatric treatment not on any medication not seeing a therapist. He has severe stresses in his life. The most currently intense is that his youngest son is dying of colon cancer. Reportedly he has been told that he has only a few days to live. Patient's own medical problems are fairly overwhelming as well. He has a pacemaker which apparently has recently been thought to be malfunctioning and needs a battery replacement for some kind of intervention. He has bilateral leg pain that is driving him crazy and because of his renal problems  can't take nonsteroidals. He also describes chronic dysfunction in his marriage.  Social history: Patient lives with his wife. They have been married 49 years. To my surprise the patient went on quite a long tirade about how he has not enjoyed his marriage for decades and feels like there is nothing  between him and his wife. I am unclear whether this really represents he is honest long-term feelings or is more an aspect of the depression. He has 3 adult children one of whom is dying of colon cancer which obviously is a profound sadness for the family. Patient does not work outside the home. He has 2 sisters who are with him here today who are supportive. Seems to have good relationships with his children.  Medical history: Patient has coronary artery disease status post bypass graft, he has a pacemaker, has atrial fibrillation, he has chronic renal insufficiency with creatinine above 3. He is terrified of the idea of dialysis and told me spontaneously on 2 occasions that he would rather kill himself than ever have dialysis. Gastric reflux and COPD as well. He also has a history of heart failure. That has been taken off of his current list of problems although he still sounds a key can't lie flat.  Substance abuse history: Patient admits that he is drinking a couple times a week. Denies use of any other drugs. Apparently drinking has been an issue between him and his wife intermittently for years. Patient does not think that drinking has ever been a problem for him. Denies any seizures or DTs.  Past Psychiatric History: Never seen a psychiatrist. Never been prescribed medicine for depression. Never been in a psychiatric hospital. Denies any history of suicide attempts. No history of psychosis or mania.  Risk to Self: Suicidal Ideation: Yes-Currently Present Suicidal Intent: Yes-Currently Present Is patient at risk for suicide?: Yes Suicidal Plan?: No Access to Means: No What has been your use of drugs/alcohol within the last 12 months?: Alcohol How many times?: 0 Other Self Harm Risks: None Reported Triggers for Past Attempts: None known Intentional Self Injurious Behavior:  (NA) Risk to Others: Homicidal Ideation: No Thoughts of Harm to Others: No Current Homicidal Intent: No Current  Homicidal Plan: No Access to Homicidal Means: No Identified Victim: NA History of harm to others?: No Assessment of Violence: None Noted Violent Behavior Description: NA Does patient have access to weapons?: No Criminal Charges Pending?: No Does patient have a court date: No Prior Inpatient Therapy: Prior Inpatient Therapy: No Prior Therapy Dates: NA Prior Therapy Facilty/Provider(s): NA Reason for Treatment: N Prior Outpatient Therapy: Prior Outpatient Therapy: No Prior Therapy Dates: NA Prior Therapy Facilty/Provider(s): NA Reason for Treatment: NA Does patient have an ACCT team?: No Does patient have Intensive In-House Services?  : No Does patient have [facility] services? : No Does patient have P4CC services?: No  Past Medical History:  Past Medical History  Diagnosis Date  . Chronic atrial fibrillation (HCC)     a. on pradaxa  . Coronary artery disease     a. s/p MI x 3;  b. s/p CABG x2 in 1999;  c. Cath 2007: LIMA->LAD patent, VG->OM 100, PCI/DES of RPL/mid RCA (cypher DES).  . Chronic obstructive pulmonary disease ([place])   . GERD (gastroesophageal reflux disease)   . Hyperlipidemia   . Hypertension   . PVD (peripheral vascular disease) ([place])   . AAA (abdominal aortic aneurysm) ([place])     a. s/p repair - 1999 @ [facility] @ time  of CABG.  . Chronic diastolic CHF (congestive heart failure) ([place])     a. 01/2012 Echo:  EF 55%.  . Tachy-brady syndrome ([place])     a. s/p PPM 2010.  [name] CKD (chronic kidney disease), stage III     a. Acute on chronic 03/2012  . Cancer ([place])     skin cancer nose  . Sleep apnea   . Broken ankle     Past Surgical History  Procedure Laterality Date  . Coronary artery bypass graft    . Abdominal aortic aneurysm repair    . Insert / replace / remove pacemaker    . Foot surgery      left foot surgery to remove cyst.  . Trigger finger release    . Skin cancer biopsy      nose  . Ankle surgery Right   . Orif ankle fracture Left 01/08/2015     Procedure: OPEN REDUCTION INTERNAL FIXATION (ORIF) ANKLE FRACTURE;  Surgeon: Earnestine Leys, MD;  Location: [facility] ORS;  Service: Orthopedics;  Laterality: Left;   Family History:  Family History  Problem Relation Age of Onset  . Stomach cancer Mother     died @ 30  . Diabetes Mother   . Hypertension Mother   . Coronary artery disease Mother   . Cerebral aneurysm Father     died @ 84  . Diabetes Father   . Hypertension Father   . Coronary artery disease Father    Family Psychiatric  History: Patient denies being aware of any family history of mental illness Social History:  History  Alcohol Use  . Yes    Comment: Rare     History  Drug Use No    Social History   Social History  . Marital Status: Married    Spouse Name: N/A  . Number of Children: N/A  . Years of Education: N/A   Social History Main Topics  . Smoking status: Former Smoker -- 1.00 packs/day for 20 years    Quit date: 04/11/1997  . Smokeless tobacco: Never Used  . Alcohol Use: Yes     Comment: Rare  . Drug Use: No  . Sexual Activity: Not Asked   Other Topics Concern  . None   Social History Narrative   Lives locally with his wife.  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     Additional Social History:    Allergies:   Allergies  Allergen Reactions  . Lipitor [Atorvastatin] Other (See Comments)    Arm pain    Labs:  Results for orders placed or performed during the hospital encounter of 08/18/15 (from the past 48 hour(s))  CBC     Status: Abnormal   Collection Time: 08/18/15  1:46 PM  Result Value Ref Range   WBC 8.2 3.8 - 10.6 K/uL   RBC 4.21 (L) 4.40 - 5.90 MIL/uL   Hemoglobin 12.6 (L) 13.0 - 18.0 g/dL   HCT 39.1 (L) 40.0 - 52.0 %   MCV 92.9 80.0 - 100.0 fL   MCH 29.8 26.0 - 34.0 pg   MCHC 32.1 32.0 - 36.0 g/dL   RDW 15.1 (H) 11.5 - 14.5 %   Platelets 180 150 - 440 K/uL  Comprehensive metabolic panel     Status: Abnormal   Collection Time: 08/18/15  1:46 PM  Result Value  Ref Range   Sodium 142 135 - 145 mmol/L   Potassium 4.2 3.5 - 5.1 mmol/L   Chloride 107 101 -  111 mmol/L   CO2 25 22 - 32 mmol/L   Glucose, Bld 86 65 - 99 mg/dL   BUN 42 (H) 6 - 20 mg/dL   Creatinine, Ser 3.24 (H) 0.61 - 1.24 mg/dL   Calcium 8.8 (L) 8.9 - 10.3 mg/dL   Total Protein 7.0 6.5 - 8.1 g/dL   Albumin 3.8 3.5 - 5.0 g/dL   AST 20 15 - 41 U/L   ALT 15 (L) 17 - 63 U/L   Alkaline Phosphatase 61 38 - 126 U/L   Total Bilirubin 0.6 0.3 - 1.2 mg/dL   GFR calc non Af Amer 17 (L) >60 mL/min   GFR calc Af Amer 19 (L) >60 mL/min    Comment: (NOTE) The eGFR has been calculated using the CKD EPI equation. This calculation has not been validated in all clinical situations. eGFR's persistently <60 mL/min signify possible Chronic Kidney Disease.    Anion gap 10 5 - 15  Ethanol     Status: Abnormal   Collection Time: 08/18/15  1:46 PM  Result Value Ref Range   Alcohol, Ethyl (B) 185 (H) <5 mg/dL    Comment:        LOWEST DETECTABLE LIMIT FOR SERUM ALCOHOL IS 5 mg/dL FOR MEDICAL PURPOSES ONLY   Troponin I     Status: None   Collection Time: 08/18/15  1:46 PM  Result Value Ref Range   Troponin I 0.03 <0.031 ng/mL    Comment:        NO INDICATION OF MYOCARDIAL INJURY.   Acetaminophen level     Status: Abnormal   Collection Time: 08/18/15  1:46 PM  Result Value Ref Range   Acetaminophen (Tylenol), Serum <10 (L) 10 - 30 ug/mL    Comment:        THERAPEUTIC CONCENTRATIONS VARY SIGNIFICANTLY. A RANGE OF 10-30 ug/mL MAY BE AN EFFECTIVE CONCENTRATION FOR MANY PATIENTS. HOWEVER, SOME ARE BEST TREATED AT CONCENTRATIONS OUTSIDE THIS RANGE. ACETAMINOPHEN CONCENTRATIONS >150 ug/mL AT 4 HOURS AFTER INGESTION AND >50 ug/mL AT 12 HOURS AFTER INGESTION ARE OFTEN ASSOCIATED WITH TOXIC REACTIONS.   Salicylate level     Status: None   Collection Time: 08/18/15  1:46 PM  Result Value Ref Range   Salicylate Lvl <7.1 2.8 - 30.0 mg/dL  Urine Drug Screen, Qualitative ([facility] only)      Status: None   Collection Time: 08/18/15  4:22 PM  Result Value Ref Range   Tricyclic, Ur Screen NONE DETECTED NONE DETECTED   Amphetamines, Ur Screen NONE DETECTED NONE DETECTED   MDMA (Ecstasy)Ur Screen NONE DETECTED NONE DETECTED   Cocaine Metabolite,Ur [place] NONE DETECTED NONE DETECTED   Opiate, Ur Screen NONE DETECTED NONE DETECTED   Phencyclidine (PCP) Ur S NONE DETECTED NONE DETECTED   Cannabinoid 50 Ng, Ur [place] NONE DETECTED NONE DETECTED   Barbiturates, Ur Screen NONE DETECTED NONE DETECTED   Benzodiazepine, Ur Scrn NONE DETECTED NONE DETECTED   Methadone Scn, Ur NONE DETECTED NONE DETECTED    Comment: (NOTE) 696  Tricyclics, urine               Cutoff 1000 ng/mL 200  Amphetamines, urine             Cutoff 1000 ng/mL 300  MDMA (Ecstasy), urine           Cutoff 500 ng/mL 400  Cocaine Metabolite, urine       Cutoff 300 ng/mL 500  Opiate, urine  Cutoff 300 ng/mL 600  Phencyclidine (PCP), urine      Cutoff 25 ng/mL 700  Cannabinoid, urine              Cutoff 50 ng/mL 800  Barbiturates, urine             Cutoff 200 ng/mL 900  Benzodiazepine, urine           Cutoff 200 ng/mL 1000 Methadone, urine                Cutoff 300 ng/mL 1100 1200 The urine drug screen provides only a preliminary, unconfirmed 1300 analytical test result and should not be used for non-medical 1400 purposes. Clinical consideration and professional judgment should 1500 be applied to any positive drug screen result due to possible 1600 interfering substances. A more specific alternate chemical method 1700 must be used in order to obtain a confirmed analytical result.  1800 Gas chromato graphy / mass spectrometry (GC/MS) is the preferred 1900 confirmatory method.     Current Facility-Administered Medications  Medication Dose Route Frequency Provider Last Rate Last Dose  . apixaban (ELIQUIS) tablet 2.5 mg  2.5 mg Oral BID Gonzella Lex, MD      . diltiazem (CARDIZEM CD) 24 hr capsule 120 mg   120 mg Oral Daily Pollyanna Levay T Leshon Armistead, MD      . donepezil (ARICEPT) tablet 5 mg  5 mg Oral QHS Gonzella Lex, MD      . escitalopram (LEXAPRO) tablet 10 mg  10 mg Oral Daily Gonzella Lex, MD      . metoprolol succinate (TOPROL-XL) 24 hr tablet 100 mg  100 mg Oral Daily Leasha Goldberger T Myiah Petkus, MD      . mirtazapine (REMERON) tablet 7.5 mg  7.5 mg Oral QHS Abad Manard T Claudeen Leason, MD      . pantoprazole (PROTONIX) EC tablet 40 mg  40 mg Oral Daily Gonzella Lex, MD      . Derrill Memo ON 08/19/2015] simvastatin (ZOCOR) tablet 20 mg  20 mg Oral q1800 Gonzella Lex, MD      . tamsulosin (FLOMAX) capsule 0.4 mg  0.4 mg Oral QPC supper Gonzella Lex, MD       Current Outpatient Prescriptions  Medication Sig Dispense Refill  . acetaminophen (TYLENOL) 325 MG tablet Take 650 mg by mouth every 6 (six) hours as needed.    [name] apixaban (ELIQUIS) 2.5 MG TABS tablet Take 1 tablet (2.5 mg total) by mouth 2 (two) times daily. 60 tablet 11  . diltiazem (CARDIZEM CD) 120 MG 24 hr capsule Take 1 capsule (120 mg total) by mouth daily. 30 capsule 3  . donepezil (ARICEPT) 5 MG tablet Take 5 mg by mouth at bedtime.    . metoprolol succinate (TOPROL-XL) 100 MG 24 hr tablet Take 1 tablet (100 mg total) by mouth daily. 30 tablet 3  . omeprazole (PRILOSEC) 20 MG capsule Take 20 mg by mouth daily.      . simvastatin (ZOCOR) 20 MG tablet Take 1 tablet (20 mg total) by mouth at bedtime. 30 tablet 6  . Tamsulosin HCl (FLOMAX) 0.4 MG CAPS Take 0.4 mg by mouth daily after supper.    . torsemide (DEMADEX) 20 MG tablet Take 1 tablet (20 mg total) by mouth 2 (two) times daily as needed (for weight gain). 60 tablet 6    Musculoskeletal: Strength & Muscle Tone: within normal limits Gait & Station: unsteady Patient leans: N/A  Psychiatric Specialty Exam: Review of Systems  Constitutional: Positive for malaise/fatigue.  HENT: Negative.   Eyes: Negative.   Respiratory: Negative.   Cardiovascular: Positive for palpitations and orthopnea.   Gastrointestinal: Positive for heartburn.  Musculoskeletal: Negative.        Patient has bilateral chronic pain in his ankles and feet related to fractures  Skin: Negative.   Neurological: Positive for sensory change and weakness.  Psychiatric/Behavioral: Positive for depression, suicidal ideas, hallucinations, memory loss and substance abuse. The patient is nervous/anxious and has insomnia.     Blood pressure 127/75, pulse 64, temperature 97.8 F (36.6 C), temperature source Axillary, resp. rate 20, height _0  (1.854 m), weight 104.781 kg (231 lb), SpO2 99 %.Body mass index is 30.48 kg/(m^2).  General Appearance: Disheveled  Eye Contact::  Good  Speech:  Normal Rate  Volume:  Increased  Mood:  Angry, Anxious and Depressed  Affect:  Depressed, Labile and Tearful  Thought Process:  Tangential  Orientation:  Full (Time, Place, and Person)  Thought Content:  Hallucinations: Auditory Visual  Suicidal Thoughts:  Yes.  with intent/plan  Homicidal Thoughts:  No  Memory:  Immediate;   Good Recent;   Fair Remote;   Fair  Judgement:  Impaired  Insight:  Shallow  Psychomotor Activity:  Decreased  Concentration:  Fair  Recall:  [employer] of Knowledge:Fair  Language: Fair  Akathisia:  No  Handed:  Right  AIMS (if indicated):     Assets:  Communication Skills Desire for Improvement Financial Resources/Insurance Housing Social Support  ADL's:  Intact  Cognition: WNL  Sleep:      Treatment Plan Summary: Daily contact with patient to assess and evaluate symptoms and progress in treatment, Medication management and Plan 82 year old man with a full complement of symptoms of major depression including irritability hopelessness anger. He has made multiple comments about suicide although when pressed he denies having any specific intention to act on it. He has firearms still at home that his wife has not gotten completely put away. He is drinking. He is not receiving any psychiatric  treatment. He feels hopeless and overwhelmed. Multiple risk factors for suicide. I am very uncomfortable with this gentleman going home right now. Supportive counseling and discussed treatment plan. Discuss plan also with ER doctor. Up old commitment. I have spoken to TTS and requested he be referred to a geriatric psychiatry unit. Initiating Lexapro 10 mg a day and Remeron 7.5 mg at night to help with sleep as well as mood. Follow up while he is in the emergency room.  Disposition: Recommend psychiatric Inpatient admission when medically cleared. Supportive therapy provided about ongoing stressors.  Alethia Berthold, MD 08/18/2015 6:06 PM

## 2015-08-18 NOTE — BH Assessment (Addendum)
Tele Assessment Note   Patient is a 82 year old white male that reports suicidal ideation without a plan.  Patient reports that his son is dying of colon cancer and he does not want to live if his son dies.   Patient reports that he is in chronic pan due to multiple surgeries on his heart, knee and hip.  Patient reports that he is scheduled to get a replacement pace maker.  Patient reports that het pace maker that he has now causes his constant pain.   Patient denies prior inpatient psychiatric hospitalization.  Patient denies prior outpatient mental health counseling or medication management for depression.  Patient denies physical, sexual or emotional abuse.  Patient denies HI/Psychosis/ Substance Abuse.    Diagnosis: Major Depressive Disorder   Past Medical History:  Past Medical History  Diagnosis Date  . Chronic atrial fibrillation (HCC)     a. on pradaxa  . Coronary artery disease     a. s/p MI x 3;  b. s/p CABG x2 in 1999;  c. Cath 2007: LIMA->LAD patent, VG->OM 100, PCI/DES of RPL/mid RCA (cypher DES).  . Chronic obstructive pulmonary disease ([place])   . GERD (gastroesophageal reflux disease)   . Hyperlipidemia   . Hypertension   . PVD (peripheral vascular disease) ([place])   . AAA (abdominal aortic aneurysm) ([place])     a. s/p repair - 1999 @ [facility] @ time of CABG.  . Chronic diastolic CHF (congestive heart failure) ([place])     a. 01/2012 Echo:  EF 55%.  . Tachy-brady syndrome ([place])     a. s/p PPM 2010.  [name] CKD (chronic kidney disease), stage III     a. Acute on chronic 03/2012  . Cancer ([place])     skin cancer nose  . Sleep apnea   . Broken ankle     Past Surgical History  Procedure Laterality Date  . Coronary artery bypass graft    . Abdominal aortic aneurysm repair    . Insert / replace / remove pacemaker    . Foot surgery      left foot surgery to remove cyst.  . Trigger finger release    . Skin cancer biopsy      nose  . Ankle surgery Right   . Orif ankle fracture  Left 01/08/2015    Procedure: OPEN REDUCTION INTERNAL FIXATION (ORIF) ANKLE FRACTURE;  Surgeon: Earnestine Leys, MD;  Location: [facility] ORS;  Service: Orthopedics;  Laterality: Left;    Family History:  Family History  Problem Relation Age of Onset  . Stomach cancer Mother     died @ 7  . Diabetes Mother   . Hypertension Mother   . Coronary artery disease Mother   . Cerebral aneurysm Father     died @ 28  . Diabetes Father   . Hypertension Father   . Coronary artery disease Father     Social History:  reports that he quit smoking about 18 years ago. He has never used smokeless tobacco. He reports that he drinks alcohol. He reports that he does not use illicit drugs.  Additional Social History:  Alcohol / Drug Use History of alcohol / drug use?: No history of alcohol / drug abuse  CIWA: CIWA-Ar BP: 135/87 mmHg Pulse Rate: 73 COWS:    PATIENT STRENGTHS: (choose at least two) Average or above average intelligence Capable of independent living Communication skills Financial means General fund of knowledge Physical Health Special hobby/interest Supportive family/friends  Allergies:  Allergies  Allergen Reactions  . Lipitor [Atorvastatin] Other (See Comments)    Arm pain    Home Medications:  (Not in a hospital admission)  OB/GYN Status:  No LMP for male patient.  General Assessment Data Location of Assessment: [facility] ED TTS Assessment: In system Is this a Tele or Face-to-Face Assessment?: Tele Assessment Is this an Initial Assessment or a Re-assessment for this encounter?: Initial Assessment Marital status: Married (Married for 70 years) [place] name: NA Is patient pregnant?: No Pregnancy Status: No Living Arrangements: Spouse/significant other Can pt return to current living arrangement?: Yes Admission Status: Voluntary Is patient capable of signing voluntary admission?: Yes Referral Source: Self/Family/Friend Insurance type: [place] Living Arrangements: Spouse/significant other Legal Guardian:  (NA) Name of Psychiatrist: None Reported Name of Therapist: None Reported  Education Status Is patient currently in school?: No Current Grade: NA Highest grade of school patient has completed: NA Name of school: NA Contact person: NA  Risk to self with the past 6 months Suicidal Ideation: Yes-Currently Present Has patient been a risk to self within the past 6 months prior to admission? : No Suicidal Intent: Yes-Currently Present Has patient had any suicidal intent within the past 6 months prior to admission? : No Is patient at risk for suicide?: Yes Suicidal Plan?: No Has patient had any suicidal plan within the past 6 months prior to admission? : No Access to Means: No What has been your use of drugs/alcohol within the last 12 months?: Alcohol Previous Attempts/Gestures: No How many times?: 0 Other Self Harm Risks: None Reported Triggers for Past Attempts: None known Intentional Self Injurious Behavior:  (NA) Family Suicide History: No Recent stressful life event(s): Other (Comment) (His son is dying of cancer ) Persecutory voices/beliefs?: No Depression: Yes Depression Symptoms: Despondent, Insomnia, Tearfulness, Isolating, Fatigue, Loss of interest in usual pleasures, Guilt, Feeling worthless/self pity, Feeling angry/irritable Substance abuse history and/or treatment for substance abuse?: No Suicide prevention information given to non-admitted patients: Yes  Risk to Others within the past 6 months Homicidal Ideation: No Does patient have any lifetime risk of violence toward others beyond the six months prior to admission? : No Thoughts of Harm to Others: No Current Homicidal Intent: No Current Homicidal Plan: No Access to Homicidal Means: No Identified Victim: NA History of harm to others?: No Assessment of Violence: None Noted Violent Behavior Description: NA Does patient have access to weapons?:  No Criminal Charges Pending?: No Does patient have a court date: No Is patient on probation?: No  Psychosis Hallucinations: None noted Delusions: None noted  Mental Status Report Appearance/Hygiene: Disheveled Eye Contact: Good Motor Activity: Freedom of movement Speech: Logical/coherent Level of Consciousness: Alert Mood: Depressed, Anxious, Despair, Helpless, Worthless, low self-esteem Affect: Blunted, Depressed, Fearful Anxiety Level: None Thought Processes: Coherent, Relevant Judgement: Unimpaired Orientation: Person, Place, Time, Situation Obsessive Compulsive Thoughts/Behaviors: None  Cognitive Functioning Concentration: Decreased Memory: Recent Intact, Remote Intact IQ: Average Insight: Fair Impulse Control: Fair Appetite: Fair Weight Loss: 0 Weight Gain: 0 Sleep: Decreased Total Hours of Sleep: 4 Vegetative Symptoms: Decreased grooming, Staying in bed  ADLScreening [facility] Assessment Services) Patient's cognitive ability adequate to safely complete daily activities?: Yes Patient able to express need for assistance with ADLs?: Yes Independently performs ADLs?: No  Prior Inpatient Therapy Prior Inpatient Therapy: No Prior Therapy Dates: NA Prior Therapy Facilty/Provider(s): NA Reason for Treatment: N  Prior Outpatient Therapy Prior Outpatient Therapy: No Prior Therapy Dates: NA Prior Therapy Facilty/Provider(s): NA Reason for  Treatment: NA Does patient have an ACCT team?: No Does patient have Intensive In-House Services?  : No Does patient have [facility] services? : No Does patient have P4CC services?: No  ADL Screening (condition at time of admission) Patient's cognitive ability adequate to safely complete daily activities?: Yes Is the patient deaf or have difficulty hearing?: No Does the patient have difficulty seeing, even when wearing glasses/contacts?: No Does the patient have difficulty concentrating, remembering, or making decisions?: No Patient  able to express need for assistance with ADLs?: Yes Does the patient have difficulty dressing or bathing?: Yes Independently performs ADLs?: No Communication: Independent Dressing (OT): Independent Grooming: Independent Feeding: Independent Bathing: Independent with device (comment) Toileting: Independent In/Out Bed: Independent with device (comment) Walks in Home: Independent Does the patient have difficulty walking or climbing stairs?: Yes Weakness of Legs: Both Weakness of Arms/Hands:  (Pain in his shoulder)  Home Assistive Devices/Equipment Home Assistive Devices/Equipment: Environmental consultant (specify type)    Abuse/Neglect Assessment (Assessment to be complete while patient is alone) Physical Abuse: Denies Verbal Abuse: Denies Sexual Abuse: Denies Exploitation of patient/patient's resources: Denies Self-Neglect: Denies Values / Beliefs Cultural Requests During Hospitalization: None Spiritual Requests During Hospitalization: None Consults Spiritual Care Consult Needed: No Social Work Consult Needed: No Regulatory affairs officer (For Healthcare) Does patient have an advance directive?: No Would patient like information on creating an advanced directive?: No - patient declined information Type of Advance Directive: Healthcare Power of Attorney Does patient want to make changes to advanced directive?: No - Patient declined Copy of advanced directive(s) in chart?: No - copy requested    Additional Information 1:1 In Past 12 Months?: No CIRT Risk: No Elopement Risk: No Does patient have medical clearance?: No     Disposition: Pending psych disposition.   Disposition Initial Assessment Completed for this Encounter: Yes Disposition of Patient:  (Pending psych disposition,)  Graciella Freer [place] 08/18/2015 2:48 PM

## 2015-08-18 NOTE — BH Assessment (Signed)
Pending psych disposition.

## 2015-08-18 NOTE — ED Notes (Signed)
Psychiatry here to see patient.

## 2015-08-18 NOTE — Telephone Encounter (Signed)
Pt wife called, states pt came in to our office this morning to get labs drawn, states when he came home he was "out of it", states she could not get him away, his speech was slurred, and was very disoriented. Pt wife states she found him this was around 11 am when she arrived home.

## 2015-08-18 NOTE — ED Provider Notes (Addendum)
[facility] Emergency Department Provider Note   ____________________________________________  Time seen: Approximately 2:03 PM  I have reviewed the triage vital signs and the nursing notes.   HISTORY  Chief Complaint Depression    HPI [name] is a 82 y.o. male who is complaining of severe depression and suicidal thoughts. Patient states that when he went home today he had a large drink alcohol because he states that when he hurts inside he drinks alcohol since he can't take any other types of pain medicine secondary to his liver problems. Patient states that his son is dying of colon cancer and at this point he just wants to die with him. Wife is here with patient and he states that he does not want her to know that he drank any alcohol because she gets extremely upset about it. Patient states that he aches all over but he does not have any true medical complaints at this time. Patient does state he also has chronic pain around his left pacemaker because it needs to be replaced in the next year.   Past Medical History  Diagnosis Date  . Chronic atrial fibrillation (HCC)     a. on pradaxa  . Coronary artery disease     a. s/p MI x 3;  b. s/p CABG x2 in 1999;  c. Cath 2007: LIMA->LAD patent, VG->OM 100, PCI/DES of RPL/mid RCA (cypher DES).  . Chronic obstructive pulmonary disease ([place])   . GERD (gastroesophageal reflux disease)   . Hyperlipidemia   . Hypertension   . PVD (peripheral vascular disease) ([place])   . AAA (abdominal aortic aneurysm) ([place])     a. s/p repair - 1999 @ [facility] @ time of CABG.  . Chronic diastolic CHF (congestive heart failure) ([place])     a. 01/2012 Echo:  EF 55%.  . Tachy-brady syndrome ([place])     a. s/p PPM 2010.  [name] CKD (chronic kidney disease), stage III     a. Acute on chronic 03/2012  . Cancer ([place])     skin cancer nose  . Sleep apnea   . Broken ankle     Patient Active Problem List   Diagnosis Date Noted  .  Bimalleolar fracture of left ankle 01/07/2015  . COPD exacerbation ([place]) 08/22/2014  . Coronary artery disease   . Chronic atrial fibrillation ([place])   . Chronic diastolic CHF (congestive heart failure) ([place])   . Tachy-brady syndrome ([place])   . CKD (chronic kidney disease), stage III 04/09/2012  . Chronic diastolic heart failure ([place]) 01/26/2012  . Chest pain 09/23/2010  . Hyperlipidemia 06/12/2009  . Essential hypertension 06/12/2009  . CORONARY ATHEROSCLEROSIS OF ARTERY BYPASS GRAFT 06/12/2009  . ATRIAL FIBRILLATION 06/12/2009  . AAA 06/12/2009  . COPD 06/12/2009  . GERD 06/12/2009  . PACEMAKER, PERMANENT 06/12/2009    Past Surgical History  Procedure Laterality Date  . Coronary artery bypass graft    . Abdominal aortic aneurysm repair    . Insert / replace / remove pacemaker    . Foot surgery      left foot surgery to remove cyst.  . Trigger finger release    . Skin cancer biopsy      nose  . Ankle surgery Right   . Orif ankle fracture Left 01/08/2015    Procedure: OPEN REDUCTION INTERNAL FIXATION (ORIF) ANKLE FRACTURE;  Surgeon: Earnestine Leys, MD;  Location: [facility] ORS;  Service: Orthopedics;  Laterality: Left;    Current Outpatient Rx  Name  Route  Sig  Dispense  Refill  . apixaban (ELIQUIS) 2.5 MG TABS tablet   Oral   Take 1 tablet (2.5 mg total) by mouth 2 (two) times daily.   60 tablet   11   . fluticasone (FLONASE) 50 MCG/ACT nasal spray   Each Nare   Place 1 spray into both nostrils daily.         . Acetaminophen (TYLENOL PO)   Oral   Take by mouth as needed.         . diltiazem (CARDIZEM CD) 120 MG 24 hr capsule   Oral   Take 1 capsule (120 mg total) by mouth daily.   30 capsule   3   . donepezil (ARICEPT) 5 MG tablet   Oral   Take 5 mg by mouth at bedtime.         [name] guaiFENesin (MUCINEX) 600 MG 12 hr tablet   Oral   Take 1 tablet (600 mg total) by mouth 2 (two) times daily.   30 tablet   0   . metoprolol succinate (TOPROL-XL) 100 MG 24 hr  tablet   Oral   Take 1 tablet (100 mg total) by mouth daily.   30 tablet   3   . omeprazole (PRILOSEC) 20 MG capsule   Oral   Take 20 mg by mouth daily.           . simvastatin (ZOCOR) 20 MG tablet   Oral   Take 1 tablet (20 mg total) by mouth at bedtime.   30 tablet   6   . Tamsulosin HCl (FLOMAX) 0.4 MG CAPS   Oral   Take 0.4 mg by mouth daily after supper.         . torsemide (DEMADEX) 20 MG tablet   Oral   Take 1 tablet (20 mg total) by mouth 2 (two) times daily as needed (for weight gain).   60 tablet   6   . traZODone (DESYREL) 50 MG tablet   Oral   Take 1 tablet (50 mg total) by mouth at bedtime. Patient taking differently: Take 25 mg by mouth at bedtime.    30 tablet   3     Allergies Lipitor  Family History  Problem Relation Age of Onset  . Stomach cancer Mother     died @ 5  . Diabetes Mother   . Hypertension Mother   . Coronary artery disease Mother   . Cerebral aneurysm Father     died @ 55  . Diabetes Father   . Hypertension Father   . Coronary artery disease Father     Social History Social History  Substance Use Topics  . Smoking status: Former Smoker -- 1.00 packs/day for 20 years    Quit date: 04/11/1997  . Smokeless tobacco: Never Used  . Alcohol Use: Yes     Comment: Rare    Review of Systems Constitutional: No fever/chills Eyes: No visual changes. ENT: No sore throat. Cardiovascular:Chronic pain around his left pacemaker. Respiratory: Denies shortness of breath. Gastrointestinal: No abdominal pain.  No nausea, no vomiting.  No diarrhea.  No constipation. Genitourinary: Negative for dysuria. Musculoskeletal: Negative for back pain. Patient states he has chronic aches all over and chronic myalgias. Skin: Negative for rash. Neurological: Negative for headaches, focal weakness or numbness. Psychological: Patient is severely depressed and has been having suicidal thoughts. 10-point ROS otherwise  negative.  ____________________________________________   PHYSICAL EXAM:  VITAL SIGNS: ED Triage  Vitals  Enc Vitals Group     BP 08/18/15 1331 132/88 mmHg     Pulse Rate 08/18/15 1331 69     Resp 08/18/15 1331 33     Temp 08/18/15 1331 97.8 F (36.6 C)     Temp Source 08/18/15 1331 Axillary     SpO2 08/18/15 1331 98 %     Weight 08/18/15 1331 231 lb (104.781 kg)     Height 08/18/15 1331 6\' 1"  (1.854 m)     Head Cir --      Peak Flow --      Pain Score 08/18/15 1332 5     Pain Loc --      Pain Edu? --      Excl. in [place]? --     Constitutional: Alert and oriented. Well appearing and in no acute distress. Eyes: Conjunctivae are normal. PERRL. EOMI. Head: Atraumatic. Nose: No congestion/rhinnorhea. Mouth/Throat: Mucous membranes are moist.  Oropharynx non-erythematous. Neck: No stridor.   Cardiovascular: Normal rate, regular rhythm. Grossly normal heart sounds.  Good peripheral circulation.Patient with pacemaker left upper chest. Respiratory: Normal respiratory effort.  No retractions. Lungs CTAB. Gastrointestinal: Soft and nontender. No distention. No abdominal bruits. No CVA tenderness. Musculoskeletal: No lower extremity tenderness nor edema.  No joint effusions. Neurologic:  Normal speech and language. No gross focal neurologic deficits are appreciated. No gait instability. Skin:  Skin is warm, dry and intact. No rash noted. Psychiatric: Mood and affect are Severely depressed and patient cries in the room. Speech and behavior are normal.  ____________________________________________   LABS (all labs ordered are listed, but only abnormal results are displayed)  Labs Reviewed  CBC - Abnormal; Notable for the following:    RBC 4.21 (*)    Hemoglobin 12.6 (*)    HCT 39.1 (*)    RDW 15.1 (*)    All other components within normal limits  COMPREHENSIVE METABOLIC PANEL  ETHANOL  URINE DRUG SCREEN, QUALITATIVE ([facility] ONLY)    ____________________________________________  EKG none ____________________________________________  RADIOLOGY  None ____________________________________________   PROCEDURES  Procedure(s) performed: None  Critical Care performed: No  ____________________________________________   INITIAL IMPRESSION / ASSESSMENT AND PLAN / ED COURSE  Pertinent labs & imaging results that were available during my care of the patient were reviewed by me and considered in my medical decision making (see chart for details).  2:31 PM Patient will get routine labs and we will consult our behavioral health people for further evaluation and treatment. Place patient with a one-on-one sitter because he has 2 family members in the room, including wife. Patient will be evaluated by behavioral health. Patient will be signed out to Dr. Karma Greaser for further treatment. ____________________________________________   FINAL CLINICAL IMPRESSION(S) / ED DIAGNOSES  Final diagnoses:  Severe depression  Suicidal ideation      NEW MEDICATIONS STARTED DURING THIS VISIT:  New Prescriptions   No medications on file     Note:  This document was prepared using Dragon voice recognition software and may include unintentional dictation errors.     Ruby Cola, MD 08/18/15 1411  Ruby Cola, MD 08/18/15 [phone]

## 2015-08-18 NOTE — BH Assessment (Signed)
Per Dr. Clapacs - patient meets criteria for inpatient hospitalization.  TTS will seek placement  

## 2015-08-18 NOTE — ED Provider Notes (Signed)
-----------------------------------------   4:35 PM on 08/18/2015 -----------------------------------------   Blood pressure 127/75, pulse 64, temperature 97.8 F (36.6 C), temperature source Axillary, resp. rate 20, height 6\' 1"  (1.854 m), weight 104.781 kg, SpO2 99 %.  The patient had no acute events since last update.  Calm and cooperative at this time.  I added on salicylate level, acetaminophen level, and a troponin although his troponin is always normal and his chest pain is chronic.  He does have chronic kidney disease and his current creatinine and GFR are not significantly worse than his baseline.  Given his increase in alcohol consumption and decreased by mouth intake, I am ordering a banana bag for hydration as well as vitamins.  I called and spoke by phone with Dr. Weber Cooks the psychiatrist who will come to the emergency department and evaluate the patient in person.  ----------------------------------------- 5:19 PM on 08/18/2015 -----------------------------------------  Dr. Weber Cooks is currently in the room with the patient and his wife and has been evaluating the patient for an extended period of time.  ----------------------------------------- 5:28 PM on 08/18/2015 -----------------------------------------  Dr. Weber Cooks discussed the case with me in person.  He is very worried about the patient but the best disposition plan is unclear.  Dr. Weber Cooks currently plans to keep him in the emergency department and order several medications to help with the depression and help him sleep.  Dr. Weber Cooks is also going to contact TTS in [place] and have them start the process of referral to a geropsychiatric facility.  The family understands the plan at this time.  Hinda Kehr, MD 08/19/15 [phone]

## 2015-08-19 ENCOUNTER — Emergency Department: Payer: Commercial Managed Care - HMO

## 2015-08-19 DIAGNOSIS — I509 Heart failure, unspecified: Secondary | ICD-10-CM | POA: Diagnosis not present

## 2015-08-19 DIAGNOSIS — F322 Major depressive disorder, single episode, severe without psychotic features: Secondary | ICD-10-CM

## 2015-08-19 LAB — BASIC METABOLIC PANEL
BUN/Creatinine Ratio: 12 (ref 10–24)
BUN: 37 mg/dL — ABNORMAL HIGH (ref 8–27)
CALCIUM: 9.3 mg/dL (ref 8.6–10.2)
CHLORIDE: 102 mmol/L (ref 96–106)
CO2: 22 mmol/L (ref 18–29)
Creatinine, Ser: 3.1 mg/dL — ABNORMAL HIGH (ref 0.76–1.27)
GFR calc Af Amer: 21 mL/min/{1.73_m2} — ABNORMAL LOW (ref >59)
GFR, EST NON AFRICAN AMERICAN: 18 mL/min/{1.73_m2} — AB (ref >59)
GLUCOSE: 111 mg/dL — AB (ref 65–99)
POTASSIUM: 4.9 mmol/L (ref 3.5–5.2)
Sodium: 145 mmol/L — ABNORMAL HIGH (ref 134–144)

## 2015-08-19 MED ORDER — ESCITALOPRAM OXALATE 10 MG PO TABS: 10.0000 mg | ORAL_TABLET | Freq: Every day | ORAL | Status: DC

## 2015-08-19 MED ORDER — MIRTAZAPINE 7.5 MG PO TABS: 7.5000 mg | ORAL_TABLET | Freq: Every day | ORAL | Status: DC

## 2015-08-19 NOTE — ED Notes (Signed)
Pt wallet and watch given to pt spouse. Nira Conn, RN witnessed spouse taking wallet and watch. Pt ok'ed this.

## 2015-08-19 NOTE — ED Notes (Signed)
BEHAVIORAL HEALTH ROUNDING Patient sleeping: Yes.   Patient alert and oriented: eyes closed  Appears to be asleep Behavior appropriate: Yes.  ; If no, describe:  Nutrition and fluids offered: sleeping Toileting and hygiene offered: sleeping Sitter present: q 15 minute observations and security monitoring Law enforcement present: yes  ODS 

## 2015-08-19 NOTE — Consult Note (Signed)
[facility] Consult   Reason for Consult:  Consult for 82 year old man brought here on referral from his cardiologist with severe depression and suicidal thoughts. Referring Physician:  Karma Greaser [name] MRN:  [id number] Principal Diagnosis: Depression, major, single episode, severe ([place]) Diagnosis:   Patient Active Problem List   Diagnosis Date Noted  . Depression, major, single episode, severe ([place]) [F32.2] 08/18/2015  . Alcohol abuse [F10.10] 08/18/2015  . Suicidal ideation [R45.851] 08/18/2015  . Bimalleolar fracture of left ankle [S82.842A] 01/07/2015  . COPD exacerbation ([place]) [J44.1] 08/22/2014  . Coronary artery disease [I25.10]   . Chronic atrial fibrillation ([place]) [I48.2]   . Chronic diastolic CHF (congestive heart failure) ([place]) [I50.32]   . Tachy-brady syndrome ([place]) [I49.5]   . CKD (chronic kidney disease), stage III [N18.3] 04/09/2012  . Chronic diastolic heart failure ([place]) [I50.32] 01/26/2012  . Chest pain [786.5] 09/23/2010  . Hyperlipidemia [E78.5] 06/12/2009  . Essential hypertension [I10] 06/12/2009  . Coronary atherosclerosis of artery bypass graft [I25.810] 06/12/2009  . ATRIAL FIBRILLATION [I48.91] 06/12/2009  . AAA [I71.4] 06/12/2009  . COPD [J44.9] 06/12/2009  . GERD [K21.9] 06/12/2009  . PACEMAKER, PERMANENT [Z95.0] 06/12/2009    Total Time spent with patient: 1 hour  Subjective:   [name] is a 82 y.o. male patient admitted with "yes I am depressed".  Follow-up note in the emergency room Wednesday the 10th. Patient has been sleeping most of the day. Very withdrawn. Affect continues to be blunted and negative. Mood very depressed. No sign of any clear improvement. We have been working on appropriate disposition to geropsychiatry. Vital signs appear stable.  HPI:  Patient interviewed. At his consent his sisters were present also and he still appeared to be very forthcoming. Chart reviewed including  medical notes. Case discussed with emergency room physician. 82 year old man with multiple medical problems reports that he has been depressed probably for months but things of been getting worse for the last couple weeks. His mood feels bad all the time. He feels sad down and hopeless and irritable. Feels like he has nothing at all to live for in his life. Sleeping poorly at night he frequently tosses and turns all night. His appetite has been diminished for the last couple weeks. He has no enjoyment in anything. He is starting to have crying spells and also worsening irritability. Patient endorses some hallucinations although I'm not sure whether they represent true psychosis. He says that at night sometimes he will hear voices when he falls asleep and will wake up talking to them. He says on some occasions he has seen some odd things but they're hard to describe. Patient has begun having thoughts about wanting to die. He does not describe any specific plan. His family however has been concerned enough that his wife has removed the hand guns from the house. Additionally patient admits that he has been drinking more recently than he used to. He says he only drinks about 2 days a week but when he does he drinks a couple large swallows of liquor. Because his wife disapproves of drinking he hides it from her but he feels it's the only way he can get to sleep. Patient is not receiving any kind of psychiatric treatment not on any medication not seeing a therapist. He has severe stresses in his life. The most currently intense is that his youngest son is dying of colon cancer. Reportedly he has been told that he has only a few days to  live. Patient's own medical problems are fairly overwhelming as well. He has a pacemaker which apparently has recently been thought to be malfunctioning and needs a battery replacement for some kind of intervention. He has bilateral leg pain that is driving him crazy and because of his  renal problems can't take nonsteroidals. He also describes chronic dysfunction in his marriage.  Social history: Patient lives with his wife. They have been married 43 years. To my surprise the patient went on quite a long tirade about how he has not enjoyed his marriage for decades and feels like there is nothing between him and his wife. I am unclear whether this really represents he is honest long-term feelings or is more an aspect of the depression. He has 3 adult children one of whom is dying of colon cancer which obviously is a profound sadness for the family. Patient does not work outside the home. He has 2 sisters who are with him here today who are supportive. Seems to have good relationships with his children.  Medical history: Patient has coronary artery disease status post bypass graft, he has a pacemaker, has atrial fibrillation, he has chronic renal insufficiency with creatinine above 3. He is terrified of the idea of dialysis and told me spontaneously on 2 occasions that he would rather kill himself than ever have dialysis. Gastric reflux and COPD as well. He also has a history of heart failure. That has been taken off of his current list of problems although he still sounds a key can't lie flat.  Substance abuse history: Patient admits that he is drinking a couple times a week. Denies use of any other drugs. Apparently drinking has been an issue between him and his wife intermittently for years. Patient does not think that drinking has ever been a problem for him. Denies any seizures or DTs.  Past Psychiatric History: Never seen a psychiatrist. Never been prescribed medicine for depression. Never been in a psychiatric hospital. Denies any history of suicide attempts. No history of psychosis or mania.  Risk to Self: Suicidal Ideation: Yes-Currently Present Suicidal Intent: Yes-Currently Present Is patient at risk for suicide?: Yes Suicidal Plan?: No Access to Means: No What has been  your use of drugs/alcohol within the last 12 months?: Alcohol How many times?: 0 Other Self Harm Risks: None Reported Triggers for Past Attempts: None known Intentional Self Injurious Behavior:  (NA) Risk to Others: Homicidal Ideation: No Thoughts of Harm to Others: No Current Homicidal Intent: No Current Homicidal Plan: No Access to Homicidal Means: No Identified Victim: NA History of harm to others?: No Assessment of Violence: None Noted Violent Behavior Description: NA Does patient have access to weapons?: No Criminal Charges Pending?: No Does patient have a court date: No Prior Inpatient Therapy: Prior Inpatient Therapy: No Prior Therapy Dates: NA Prior Therapy Facilty/Provider(s): NA Reason for Treatment: N Prior Outpatient Therapy: Prior Outpatient Therapy: No Prior Therapy Dates: NA Prior Therapy Facilty/Provider(s): NA Reason for Treatment: NA Does patient have an ACCT team?: No Does patient have Intensive In-House Services?  : No Does patient have [facility] services? : No Does patient have P4CC services?: No  Past Medical History:  Past Medical History  Diagnosis Date  . Chronic atrial fibrillation (HCC)     a. on pradaxa  . Coronary artery disease     a. s/p MI x 3;  b. s/p CABG x2 in 1999;  c. Cath 2007: LIMA->LAD patent, VG->OM 100, PCI/DES of RPL/mid RCA (cypher DES).  [name]  Chronic obstructive pulmonary disease ([place])   . GERD (gastroesophageal reflux disease)   . Hyperlipidemia   . Hypertension   . PVD (peripheral vascular disease) ([place])   . AAA (abdominal aortic aneurysm) ([place])     a. s/p repair - 1999 @ [facility] @ time of CABG.  . Chronic diastolic CHF (congestive heart failure) ([place])     a. 01/2012 Echo:  EF 55%.  . Tachy-brady syndrome ([place])     a. s/p PPM 2010.  [name] CKD (chronic kidney disease), stage III     a. Acute on chronic 03/2012  . Cancer ([place])     skin cancer nose  . Sleep apnea   . Broken ankle     Past Surgical History  Procedure Laterality  Date  . Coronary artery bypass graft    . Abdominal aortic aneurysm repair    . Insert / replace / remove pacemaker    . Foot surgery      left foot surgery to remove cyst.  . Trigger finger release    . Skin cancer biopsy      nose  . Ankle surgery Right   . Orif ankle fracture Left 01/08/2015    Procedure: OPEN REDUCTION INTERNAL FIXATION (ORIF) ANKLE FRACTURE;  Surgeon: Earnestine Leys, MD;  Location: [facility] ORS;  Service: Orthopedics;  Laterality: Left;   Family History:  Family History  Problem Relation Age of Onset  . Stomach cancer Mother     died @ 29  . Diabetes Mother   . Hypertension Mother   . Coronary artery disease Mother   . Cerebral aneurysm Father     died @ 70  . Diabetes Father   . Hypertension Father   . Coronary artery disease Father    Family Psychiatric  History: Patient denies being aware of any family history of mental illness Social History:  History  Alcohol Use  . Yes    Comment: Rare     History  Drug Use No    Social History   Social History  . Marital Status: Married    Spouse Name: N/A  . Number of Children: N/A  . Years of Education: N/A   Social History Main Topics  . Smoking status: Former Smoker -- 1.00 packs/day for 20 years    Quit date: 04/11/1997  . Smokeless tobacco: Never Used  . Alcohol Use: Yes     Comment: Rare  . Drug Use: No  . Sexual Activity: Not Asked   Other Topics Concern  . None   Social History Narrative   Lives locally with his wife.  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     Additional Social History:    Allergies:   Allergies  Allergen Reactions  . Lipitor [Atorvastatin] Other (See Comments)    Arm pain    Labs:  Results for orders placed or performed during the hospital encounter of 08/18/15 (from the past 48 hour(s))  CBC     Status: Abnormal   Collection Time: 08/18/15  1:46 PM  Result Value Ref Range   WBC 8.2 3.8 - 10.6 K/uL   RBC 4.21 (L) 4.40 - 5.90 MIL/uL   Hemoglobin  12.6 (L) 13.0 - 18.0 g/dL   HCT 39.1 (L) 40.0 - 52.0 %   MCV 92.9 80.0 - 100.0 fL   MCH 29.8 26.0 - 34.0 pg   MCHC 32.1 32.0 - 36.0 g/dL   RDW 15.1 (H) 11.5 - 14.5 %   Platelets 180  150 - 440 K/uL  Comprehensive metabolic panel     Status: Abnormal   Collection Time: 08/18/15  1:46 PM  Result Value Ref Range   Sodium 142 135 - 145 mmol/L   Potassium 4.2 3.5 - 5.1 mmol/L   Chloride 107 101 - 111 mmol/L   CO2 25 22 - 32 mmol/L   Glucose, Bld 86 65 - 99 mg/dL   BUN 42 (H) 6 - 20 mg/dL   Creatinine, Ser 3.24 (H) 0.61 - 1.24 mg/dL   Calcium 8.8 (L) 8.9 - 10.3 mg/dL   Total Protein 7.0 6.5 - 8.1 g/dL   Albumin 3.8 3.5 - 5.0 g/dL   AST 20 15 - 41 U/L   ALT 15 (L) 17 - 63 U/L   Alkaline Phosphatase 61 38 - 126 U/L   Total Bilirubin 0.6 0.3 - 1.2 mg/dL   GFR calc non Af Amer 17 (L) >60 mL/min   GFR calc Af Amer 19 (L) >60 mL/min    Comment: (NOTE) The eGFR has been calculated using the CKD EPI equation. This calculation has not been validated in all clinical situations. eGFR's persistently <60 mL/min signify possible Chronic Kidney Disease.    Anion gap 10 5 - 15  Ethanol     Status: Abnormal   Collection Time: 08/18/15  1:46 PM  Result Value Ref Range   Alcohol, Ethyl (B) 185 (H) <5 mg/dL    Comment:        LOWEST DETECTABLE LIMIT FOR SERUM ALCOHOL IS 5 mg/dL FOR MEDICAL PURPOSES ONLY   Troponin I     Status: None   Collection Time: 08/18/15  1:46 PM  Result Value Ref Range   Troponin I 0.03 <0.031 ng/mL    Comment:        NO INDICATION OF MYOCARDIAL INJURY.   Acetaminophen level     Status: Abnormal   Collection Time: 08/18/15  1:46 PM  Result Value Ref Range   Acetaminophen (Tylenol), Serum <10 (L) 10 - 30 ug/mL    Comment:        THERAPEUTIC CONCENTRATIONS VARY SIGNIFICANTLY. A RANGE OF 10-30 ug/mL MAY BE AN EFFECTIVE CONCENTRATION FOR MANY PATIENTS. HOWEVER, SOME ARE BEST TREATED AT CONCENTRATIONS OUTSIDE THIS RANGE. ACETAMINOPHEN CONCENTRATIONS >150 ug/mL  AT 4 HOURS AFTER INGESTION AND >50 ug/mL AT 12 HOURS AFTER INGESTION ARE OFTEN ASSOCIATED WITH TOXIC REACTIONS.   Salicylate level     Status: None   Collection Time: 08/18/15  1:46 PM  Result Value Ref Range   Salicylate Lvl <7.2 2.8 - 30.0 mg/dL  Urine Drug Screen, Qualitative ([facility] only)     Status: None   Collection Time: 08/18/15  4:22 PM  Result Value Ref Range   Tricyclic, Ur Screen NONE DETECTED NONE DETECTED   Amphetamines, Ur Screen NONE DETECTED NONE DETECTED   MDMA (Ecstasy)Ur Screen NONE DETECTED NONE DETECTED   Cocaine Metabolite,Ur [place] NONE DETECTED NONE DETECTED   Opiate, Ur Screen NONE DETECTED NONE DETECTED   Phencyclidine (PCP) Ur S NONE DETECTED NONE DETECTED   Cannabinoid 50 Ng, Ur [place] NONE DETECTED NONE DETECTED   Barbiturates, Ur Screen NONE DETECTED NONE DETECTED   Benzodiazepine, Ur Scrn NONE DETECTED NONE DETECTED   Methadone Scn, Ur NONE DETECTED NONE DETECTED    Comment: (NOTE) 820  Tricyclics, urine               Cutoff 1000 ng/mL 200  Amphetamines, urine  Cutoff 1000 ng/mL 300  MDMA (Ecstasy), urine           Cutoff 500 ng/mL 400  Cocaine Metabolite, urine       Cutoff 300 ng/mL 500  Opiate, urine                   Cutoff 300 ng/mL 600  Phencyclidine (PCP), urine      Cutoff 25 ng/mL 700  Cannabinoid, urine              Cutoff 50 ng/mL 800  Barbiturates, urine             Cutoff 200 ng/mL 900  Benzodiazepine, urine           Cutoff 200 ng/mL 1000 Methadone, urine                Cutoff 300 ng/mL 1100 1200 The urine drug screen provides only a preliminary, unconfirmed 1300 analytical test result and should not be used for non-medical 1400 purposes. Clinical consideration and professional judgment should 1500 be applied to any positive drug screen result due to possible 1600 interfering substances. A more specific alternate chemical method 1700 must be used in order to obtain a confirmed analytical result.  1800 Gas chromato graphy / mass  spectrometry (GC/MS) is the preferred 1900 confirmatory method.     Current Facility-Administered Medications  Medication Dose Route Frequency Provider Last Rate Last Dose  . apixaban (ELIQUIS) tablet 2.5 mg  2.5 mg Oral BID Gonzella Lex, MD   2.5 mg at 08/18/15 2248  . diltiazem (CARDIZEM CD) 24 hr capsule 120 mg  120 mg Oral Daily Gonzella Lex, MD   120 mg at 08/18/15 1846  . donepezil (ARICEPT) tablet 5 mg  5 mg Oral QHS Gonzella Lex, MD   5 mg at 08/18/15 2208  . escitalopram (LEXAPRO) tablet 10 mg  10 mg Oral Daily Gonzella Lex, MD   10 mg at 08/18/15 1847  . metoprolol succinate (TOPROL-XL) 24 hr tablet 100 mg  100 mg Oral Daily Gonzella Lex, MD   100 mg at 08/18/15 1848  . mirtazapine (REMERON) tablet 7.5 mg  7.5 mg Oral QHS Gonzella Lex, MD   7.5 mg at 08/18/15 2209  . pantoprazole (PROTONIX) EC tablet 40 mg  40 mg Oral Daily Gonzella Lex, MD   40 mg at 08/18/15 1848  . simvastatin (ZOCOR) tablet 20 mg  20 mg Oral q1800 Gonzella Lex, MD      . tamsulosin [facility]) capsule 0.4 mg  0.4 mg Oral QPC supper Gonzella Lex, MD   0.4 mg at 08/18/15 1845   Current Outpatient Prescriptions  Medication Sig Dispense Refill  . acetaminophen (TYLENOL) 325 MG tablet Take 650 mg by mouth every 6 (six) hours as needed.    [name] apixaban (ELIQUIS) 2.5 MG TABS tablet Take 1 tablet (2.5 mg total) by mouth 2 (two) times daily. 60 tablet 11  . diltiazem (CARDIZEM CD) 120 MG 24 hr capsule Take 1 capsule (120 mg total) by mouth daily. 30 capsule 3  . donepezil (ARICEPT) 5 MG tablet Take 5 mg by mouth at bedtime.    . metoprolol succinate (TOPROL-XL) 100 MG 24 hr tablet Take 1 tablet (100 mg total) by mouth daily. 30 tablet 3  . omeprazole (PRILOSEC) 20 MG capsule Take 20 mg by mouth daily.      . simvastatin (ZOCOR) 20 MG tablet Take 1 tablet (20 mg  total) by mouth at bedtime. 30 tablet 6  . Tamsulosin HCl (FLOMAX) 0.4 MG CAPS Take 0.4 mg by mouth daily after supper.    . torsemide (DEMADEX)  20 MG tablet Take 1 tablet (20 mg total) by mouth 2 (two) times daily as needed (for weight gain). 60 tablet 6    Musculoskeletal: Strength & Muscle Tone: within normal limits Gait & Station: unsteady Patient leans: N/A  Psychiatric Specialty Exam: Review of Systems  Constitutional: Positive for malaise/fatigue.  HENT: Negative.   Eyes: Negative.   Respiratory: Negative.   Cardiovascular: Positive for palpitations and orthopnea.  Gastrointestinal: Positive for heartburn.  Musculoskeletal: Negative.        Patient has bilateral chronic pain in his ankles and feet related to fractures  Skin: Negative.   Neurological: Positive for sensory change and weakness.  Psychiatric/Behavioral: Positive for depression, suicidal ideas, hallucinations, memory loss and substance abuse. The patient is nervous/anxious and has insomnia.     Blood pressure 143/61, pulse 65, temperature 98.4 F (36.9 C), temperature source Oral, resp. rate 18, height 6' 1"  (1.854 m), weight 104.781 kg (231 lb), SpO2 94 %.Body mass index is 30.48 kg/(m^2).  General Appearance: Disheveled  Eye Contact::  Good  Speech:  Normal Rate  Volume:  Increased  Mood:  Angry, Anxious and Depressed  Affect:  Depressed, Labile and Tearful  Thought Process:  Tangential  Orientation:  Full (Time, Place, and Person)  Thought Content:  Hallucinations: Auditory Visual  Suicidal Thoughts:  Yes.  with intent/plan  Homicidal Thoughts:  No  Memory:  Immediate;   Good Recent;   Fair Remote;   Fair  Judgement:  Impaired  Insight:  Shallow  Psychomotor Activity:  Decreased  Concentration:  Fair  Recall:  [place]: Fair  Akathisia:  No  Handed:  Right  AIMS (if indicated):     Assets:  Communication Skills Desire for Improvement Financial Resources/Insurance Housing Social Support  ADL's:  Intact  Cognition: WNL  Sleep:      Treatment Plan Summary: Daily contact with patient to assess and  evaluate symptoms and progress in treatment, Medication management and Plan At this point we have received a bed offer to the hospital encounter [facility]. Patient has had an EKG and chest x-ray done today. Vital signs stable. No contraindication to transfer and admission to appropriate psychiatric facility. No change to medication at this point. Case reviewed with TTS. Patient should continue on to [facility] under commitment paperwork.  Disposition: Recommend psychiatric Inpatient admission when medically cleared. Supportive therapy provided about ongoing stressors.  Alethia Berthold, MD 08/19/2015 3:20 PM

## 2015-08-19 NOTE — ED Notes (Signed)
BEHAVIORAL HEALTH ROUNDING Patient sleeping: No. Patient alert and oriented: yes Behavior appropriate: Yes.  ; If no, describe:  Nutrition and fluids offered: yes Toileting and hygiene offered: Yes  Sitter present: q15 minute observations and security monitoring Law enforcement present: Yes  ODS   A man and a woman has come to visit

## 2015-08-19 NOTE — ED Notes (Signed)
Pt sleeping. Will get vitals when pt wakes up.

## 2015-08-19 NOTE — ED Notes (Signed)
BEHAVIORAL HEALTH ROUNDING Patient sleeping: No. Patient alert and oriented: yes Behavior appropriate: Yes.  ; If no, describe:  Nutrition and fluids offered: yes Toileting and hygiene offered: Yes  Sitter present: q15 minute observations and security  monitoring Law enforcement present: Yes  ODS  

## 2015-08-19 NOTE — Consult Note (Signed)
  Psychiatry: Follow-up note for this 82 year old gentleman with depression. Since my last note earlier today we had arranged for disposition to a geriatric psychiatry unit. However around the same time the patient's wife came into the hospital and wanted to discuss having him come home. I met with the patient and his wife together. We discussed his mood symptoms and his statements of suicidality from yesterday. Patient today states that he was just saying those things because he was upset but has no intention to kill himself. Wife believes that he has no risk of killing himself. Wife strongly would like him to come home because of the imminent death of their son. On interview this afternoon later the patient is much calmer. He is alert and oriented. He is able to acknowledge she has a problem with his mood. Patient has been advised that I still would recommend him taking antidepressants and following up with his outpatient doctor and with a psychiatrist. I have also strongly encouraged him to discontinue alcohol use.  I reviewed with both of them his risk factors for suicide. Wife says that she feels safe with him coming home and will keep a close eye on him. At this point I have reviewed this with the emergency room doctor and TTS and have reversed my previous decision. I will release the patient from involuntary commitment. I understand the high level of stress they are under and his wish to be with his son at and other family at this time. Patient and wife both understand that if symptoms worsen and of suicidality returns is an issue they need to get immediate help either here or with his doctor or somewhere. They all agree to this. He is given a prescription for the Lexapro 10 mg a day and mirtazapine 7.5 mg at night. Patient can be released now from the emergency room.

## 2015-08-19 NOTE — ED Provider Notes (Signed)
-----------------------------------------   6:23 AM on 08/19/2015 -----------------------------------------   Blood pressure 129/64, pulse 88, temperature 98.6 F (37 C), temperature source Axillary, resp. rate 18, height 6\' 1"  (1.854 m), weight 231 lb (104.781 kg), SpO2 93 %.  The patient had no acute events since last update.  Calm and cooperative at this time.  Disposition is pending per Psychiatry/Behavioral Medicine team recommendations.     Paulette Blanch, MD 08/19/15 [phone]

## 2015-08-19 NOTE — ED Provider Notes (Signed)
-----------------------------------------   1:36 PM on 08/19/2015 -----------------------------------------  EKG reviewed and interpreted by myself shows a ventricular paced rhythm at 62 bpm. No concerning findings.  Harvest Dark, MD 08/19/15 [phone]

## 2015-08-19 NOTE — ED Provider Notes (Signed)
-----------------------------------------   4:34 PM on 08/19/2015 -----------------------------------------  Since wife is now present in the ER. She and the patient are very upset that patient was to be transferred to another treatment facility. Their son has terminal colon cancer and is dying. They are both very anxious for pt to be discharged to home so that they can be with teheir son at this time. Patient's wife feels that he has calm and will take his medications at this time. Dr. Weber Cooks reassessed the patient and extensively discussed the situation with him and his wife. He feels comfortable discontinuing the involuntary commitment at this time and patient will be released to home.  Filed Vitals:   08/19/15 0622 08/19/15 1605  BP: 143/61   Pulse: 65 78  Temp: 98.4 F (36.9 C)   Resp: 18      Robert Ort, MD 08/19/15 1635

## 2015-08-19 NOTE — BH Specialist Note (Signed)
TTS faxed referral information to: [facility], [facility], and [facility].

## 2015-08-19 NOTE — ED Notes (Addendum)
BEHAVIORAL HEALTH ROUNDING Patient sleeping: yes Patient alert and oriented: no  Behavior appropriate: Yes.  ; If no, describe:  Nutrition and fluids offered: sleeping Toileting and hygiene offered: sleep Sitter present: q15 minute observations and security monitoring Law enforcement present: Yes  ODS  ENVIRONMENTAL ASSESSMENT Potentially harmful objects out of patient reach: Yes.   Personal belongings secured: Yes.   Patient dressed in hospital provided attire only: Yes.   Plastic bags out of patient reach: Yes.   Patient care equipment (cords, cables, call bells, lines, and drains) shortened, removed, or accounted for: Yes.   Equipment and supplies removed from bottom of stretcher: Yes.   Potentially toxic materials out of patient reach: Yes.   Sharps container removed or out of patient reach: Yes.

## 2015-08-19 NOTE — BH Assessment (Addendum)
Patient has been accepted to [facility] [place].  Accepting physician is Dr. Nira Retort.  Call report to [phone].  Representative was [employer].  ER Staff is aware of it Lillia Pauls, ER Sect.; Dr. Kerman Passey, ER MD & Amy T. Patient's Nurse).  Patient's wife [name] [place]-[phone]) have been updated as well. Wife is upset. Writer made several attempts to explain to the wife, the patient was under IVC but she kept talking over the Probation officer.

## 2015-08-19 NOTE — BH Assessment (Signed)
Writer contacted [facility] ([phone]) and informed them, the patient will not be admitted with their facility.

## 2015-08-19 NOTE — Discharge Instructions (Signed)
Return to the ER for thoughts of wanting to hurt yourself or someone else or for any other concerns.   Major Depressive Disorder Major depressive disorder is a mental illness. It also may be called clinical depression or unipolar depression. Major depressive disorder usually causes feelings of sadness, hopelessness, or helplessness. Some people with this disorder do not feel particularly sad but lose interest in doing things they used to enjoy (anhedonia). Major depressive disorder also can cause physical symptoms. It can interfere with work, school, relationships, and other normal everyday activities. The disorder varies in severity but is longer lasting and more serious than the sadness we all feel from time to time in our lives. Major depressive disorder often is triggered by stressful life events or major life changes. Examples of these triggers include divorce, loss of your job or home, a move, and the death of a family member or close friend. Sometimes this disorder occurs for no obvious reason at all. People who have family members with major depressive disorder or bipolar disorder are at higher risk for developing this disorder, with or without life stressors. Major depressive disorder can occur at any age. It may occur just once in your life (single episode major depressive disorder). It may occur multiple times (recurrent major depressive disorder). SYMPTOMS People with major depressive disorder have either anhedonia or depressed mood on nearly a daily basis for at least 2 weeks or longer. Symptoms of depressed mood include:  Feelings of sadness (blue or down in the dumps) or emptiness.  Feelings of hopelessness or helplessness.  Tearfulness or episodes of crying (may be observed by others).  Irritability (children and adolescents). In addition to depressed mood or anhedonia or both, people with this disorder have at least four of the following symptoms:  Difficulty sleeping or sleeping  too much.   Significant change (increase or decrease) in appetite or weight.   Lack of energy or motivation.  Feelings of guilt and worthlessness.   Difficulty concentrating, remembering, or making decisions.  Unusually slow movement (psychomotor retardation) or restlessness (as observed by others).   Recurrent wishes for death, recurrent thoughts of self-harm (suicide), or a suicide attempt. People with major depressive disorder commonly have persistent negative thoughts about themselves, other people, and the world. People with severe major depressive disorder may experiencedistorted beliefs or perceptions about the world (psychotic delusions). They also may see or hear things that are not real (psychotic hallucinations). DIAGNOSIS Major depressive disorder is diagnosed through an assessment by your health care provider. Your health care provider will ask aboutaspects of your daily life, such as mood,sleep, and appetite, to see if you have the diagnostic symptoms of major depressive disorder. Your health care provider may ask about your medical history and use of alcohol or drugs, including prescription medicines. Your health care provider also may do a physical exam and blood work. This is because certain medical conditions and the use of certain substances can cause major depressive disorder-like symptoms (secondary depression). Your health care provider also may refer you to a mental health specialist for further evaluation and treatment. TREATMENT It is important to recognize the symptoms of major depressive disorder and seek treatment. The following treatments can be prescribed for this disorder:   Medicine. Antidepressant medicines usually are prescribed. Antidepressant medicines are thought to correct chemical imbalances in the brain that are commonly associated with major depressive disorder. Other types of medicine may be added if the symptoms do not respond to antidepressant  medicines alone or  if psychotic delusions or hallucinations occur.  Talk therapy. Talk therapy can be helpful in treating major depressive disorder by providing support, education, and guidance. Certain types of talk therapy also can help with negative thinking (cognitive behavioral therapy) and with relationship issues that trigger this disorder (interpersonal therapy). A mental health specialist can help determine which treatment is best for you. Most people with major depressive disorder do well with a combination of medicine and talk therapy. Treatments involving electrical stimulation of the brain can be used in situations with extremely severe symptoms or when medicine and talk therapy do not work over time. These treatments include electroconvulsive therapy, transcranial magnetic stimulation, and vagal nerve stimulation.   This information is not intended to replace advice given to you by your health care provider. Make sure you discuss any questions you have with your health care provider.   Document Released: 07/23/2012 Document Revised: 04/18/2014 Document Reviewed: 07/23/2012 Elsevier Interactive Patient Education 2016 [employer].   Suicidal Feelings: How to Help Yourself Suicide is the taking of one's own life. If you feel as though life is getting too tough to handle and are thinking about suicide, get help right away. To get help:  Call your local emergency services (911 in the [place]  Call a suicide hotline to speak with a trained counselor who understands how you are feeling. The following is a list of suicide hotlines in the [place]. For a list of hotlines in [place], visit FindSkins.pl.  1-800-273-TALK 762-014-6980).  1-800-SUICIDE 904-463-6125).  641-514-6532. This is a hotline for Spanish speakers.  U1166179 203 028 7926). This is a hotline for TTY users.  1-866-4-U-TREVOR  509-684-7078). This is a hotline for lesbian, gay, bisexual, transgender, or questioning youth.  Contact a crisis center or a local suicide prevention center. To find a crisis center or suicide prevention center:  Call your local hospital, clinic, community service organization, mental health center, social service provider, or health department. Ask for assistance in connecting to a crisis center.  Visit BankingRep.com.au for a list of crisis centers in the [place], or visit www.suicideprevention.ca/thinking-about-suicide/find-a-crisis-centre for a list of centers in [place].  Visit the following websites:  National Suicide Prevention Lifeline: www.suicidepreventionlifeline.org  Hopeline: www.hopeline.[place] for Suicide Prevention: PromotionalLoans.co.za  The [employer] (for lesbian, gay, bisexual, transgender, or questioning youth): www.thetrevorproject.org HOW CAN I HELP MYSELF FEEL BETTER?  Promise yourself that you will not do anything drastic when you have suicidal feelings. Remember, there is hope. Many people have gotten through suicidal thoughts and feelings, and you will, too. You may have gotten through them before, and this proves that you can get through them again.  Let family, friends, teachers, or counselors know how you are feeling. Try not to isolate yourself from those who care about you. Remember, they will want to help you. Talk with someone every day, even if you do not feel sociable. Face-to-face conversation is best.  Call a mental health professional and see one regularly.  Visit your primary health care provider every year.  Eat a well-balanced diet, and space your meals so you eat regularly.  Get plenty of rest.  Avoid alcohol and drugs, and remove them from your home. They will only make you feel worse.  If you are thinking of taking a lot of medicine, give your medicine to someone who can give it  to you one day at a time. If you are on antidepressants and are concerned you will overdose, let your health care provider  know so he or she can give you safer medicines. Ask your mental health professional about the possible side effects of any medicines you are taking.  Remove weapons, poisons, knives, and anything else that could harm you from your home.  Try to stick to routines. Follow a schedule every day. Put self-care on your schedule.  Make a list of realistic goals, and cross them off when you achieve them. Accomplishments give a sense of worth.  Wait until you are feeling better before doing the things you find difficult or unpleasant.  Exercise if you are able. You will feel better if you exercise for even a half hour each day.  Go out in the sun or into nature. This will help you recover from depression faster. If you have a favorite place to walk, go there.  Do the things that have always given you pleasure. Play your favorite music, read a good book, paint a picture, play your favorite instrument, or do anything else that takes your mind off your depression if it is safe to do.  Keep your living space well lit.  When you are feeling well, write yourself a letter about tips and support that you can read when you are not feeling well.  Remember that life's difficulties can be sorted out with help. Conditions can be treated. You can work on thoughts and strategies that serve you well.   This information is not intended to replace advice given to you by your health care provider. Make sure you discuss any questions you have with your health care provider.   Document Released: 10/02/2002 Document Revised: 04/18/2014 Document Reviewed: 07/23/2013 Elsevier Interactive Patient Education [employer].

## 2015-08-19 NOTE — ED Provider Notes (Addendum)
-----------------------------------------   3:42 PM on 08/19/2015 -----------------------------------------  Patient has been seen and evaluate by psychiatry. Patient has been accepted to a facility for further treatment. Patient will be transferred.  Harvest Dark, MD 08/19/15 1544

## 2015-08-19 NOTE — ED Notes (Signed)
Patient observed lying in bed with eyes closed  Even, unlabored respirations observed   NAD pt appears to be sleeping  I will continue to monitor along with every 15 minute visual observations and ongoing security monitoring    

## 2015-08-27 DIAGNOSIS — F4321 Adjustment disorder with depressed mood: Secondary | ICD-10-CM | POA: Diagnosis not present

## 2015-08-27 DIAGNOSIS — R5381 Other malaise: Secondary | ICD-10-CM | POA: Diagnosis not present

## 2015-08-27 DIAGNOSIS — R5383 Other fatigue: Secondary | ICD-10-CM | POA: Diagnosis not present

## 2015-08-30 DIAGNOSIS — J449 Chronic obstructive pulmonary disease, unspecified: Secondary | ICD-10-CM | POA: Diagnosis not present

## 2015-08-30 DIAGNOSIS — R2689 Other abnormalities of gait and mobility: Secondary | ICD-10-CM | POA: Diagnosis not present

## 2015-08-30 DIAGNOSIS — I509 Heart failure, unspecified: Secondary | ICD-10-CM | POA: Diagnosis not present

## 2015-08-30 DIAGNOSIS — S82842A Displaced bimalleolar fracture of left lower leg, initial encounter for closed fracture: Secondary | ICD-10-CM | POA: Diagnosis not present

## 2015-08-30 DIAGNOSIS — R2681 Unsteadiness on feet: Secondary | ICD-10-CM | POA: Diagnosis not present

## 2015-08-30 DIAGNOSIS — I5032 Chronic diastolic (congestive) heart failure: Secondary | ICD-10-CM | POA: Diagnosis not present

## 2015-09-08 ENCOUNTER — Encounter (INDEPENDENT_AMBULATORY_CARE_PROVIDER_SITE_OTHER): Payer: Self-pay

## 2015-09-08 ENCOUNTER — Other Ambulatory Visit: Payer: Self-pay

## 2015-09-08 ENCOUNTER — Encounter: Payer: Self-pay | Admitting: Cardiovascular Disease

## 2015-09-08 ENCOUNTER — Ambulatory Visit (INDEPENDENT_AMBULATORY_CARE_PROVIDER_SITE_OTHER): Payer: Commercial Managed Care - HMO | Admitting: Cardiovascular Disease

## 2015-09-08 ENCOUNTER — Ambulatory Visit (INDEPENDENT_AMBULATORY_CARE_PROVIDER_SITE_OTHER): Payer: Commercial Managed Care - HMO

## 2015-09-08 VITALS — BP 150/90 | HR 70 | Ht 73.0 in | Wt 234.5 lb

## 2015-09-08 DIAGNOSIS — N183 Chronic kidney disease, stage 3 unspecified: Secondary | ICD-10-CM

## 2015-09-08 DIAGNOSIS — I25810 Atherosclerosis of coronary artery bypass graft(s) without angina pectoris: Secondary | ICD-10-CM

## 2015-09-08 DIAGNOSIS — R0602 Shortness of breath: Secondary | ICD-10-CM

## 2015-09-08 DIAGNOSIS — I482 Chronic atrial fibrillation, unspecified: Secondary | ICD-10-CM

## 2015-09-08 DIAGNOSIS — F1010 Alcohol abuse, uncomplicated: Secondary | ICD-10-CM

## 2015-09-08 DIAGNOSIS — I5032 Chronic diastolic (congestive) heart failure: Secondary | ICD-10-CM

## 2015-09-08 DIAGNOSIS — I4891 Unspecified atrial fibrillation: Secondary | ICD-10-CM

## 2015-09-08 DIAGNOSIS — I10 Essential (primary) hypertension: Secondary | ICD-10-CM

## 2015-09-08 DIAGNOSIS — R079 Chest pain, unspecified: Secondary | ICD-10-CM | POA: Diagnosis not present

## 2015-09-08 NOTE — Assessment & Plan Note (Signed)
Recommended that he not drive, that his wife do the driving for him Long history of alcohol abuse, falls, self injury

## 2015-09-08 NOTE — Assessment & Plan Note (Signed)
I have looked at his echocardiogram performed today, prelim showing normal ejection fraction, elevated right heart pressures. Recommended he continue torsemide every other day, BMP from today pending

## 2015-09-08 NOTE — Assessment & Plan Note (Signed)
Currently with no symptoms of angina. No further workup at this time. Continue current medication regimen. 

## 2015-09-08 NOTE — Patient Instructions (Addendum)
You are doing well. No medication changes were made.  We will check labs today, BMP Continue torsemide every other day for now We may get to as needed if kidney number runs high  Please call us if you have new issues that need to be addressed before your next appt.  Your physician wants you to follow-up in: 6 months.  You will receive a reminder letter in the mail two months in advance. If you don't receive a letter, please call our office to schedule the follow-up appointment.

## 2015-09-08 NOTE — Assessment & Plan Note (Signed)
Worsening renal function on torsemide in the setting of moderate to severely elevated right heart pressures and chest congestion. BNP pending from today. We'll continue torsemide every other day

## 2015-09-08 NOTE — Progress Notes (Signed)
Patient ID: [name], male    DOB: 1934/03/18, 82 y.o.   MRN: [id number]  HPI Comments: 82 year old gentleman with a history of coronary artery disease, bypass surgery in 1999 with catheterization in 2007 with patent LIMA to the LAD, occluded vein graft to the OM, history of Cypher stent to the PL branch and mid RCA in September 07, ejection fraction 45-50%, chronic atrial fibrillation with Medtronic pacemaker, hx of previous recurrent falls and injury from alcohol abuse, Pacemaker was placed by [facility] heart and vascular in 2010 for syncope, AFib with pauses.  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 He presents for routine follow-up of his coronary artery disease and atrial fibrillation   in follow-up today, recent evaluation in the hospital for suicidal ideation in the setting of his son being ill with cancer Reports having chronic chest congestion Torsemide decreased down to 20 mg every other day as creatinine was greater than 3, well above his baseline Previously he reports he was taking torsemide only as needed Denies any significant leg edema Continues to drink alcohol, recent alcohol level in the hospital markedly elevated Tolerating anticoagulation, eliquis.  He is on low-dose, high fall risk  EKG shows atrial fibrillation, ventricular paced rhythm  Other past medical history In September 2016 suffered left foot fracture/ankle. This is his second ankle fracture, both from falls  Previous hospitalization for malaise, dehydration, creatinine greater than 3. Lisinopril and Lasix was discontinued.   Previous echocardiogram  showed mild to moderate pulmonary hypertension. Creatinine of 2.   History of positive sleep study, was on CPAP for several months. Recent change to his insurance now has no coverage  Previous lab work from 10/23/2012 shows total cholesterol 212, LDL 142, HDL 50, creatinine 1.7, BUN 22 He is status post right knee  replacement.     Allergies  Allergen Reactions  . Lipitor [Atorvastatin] Other (See Comments)    Arm pain    Outpatient Encounter Prescriptions as of 09/08/2015  Medication Sig  . acetaminophen (TYLENOL) 325 MG tablet Take 650 mg by mouth every 6 (six) hours as needed.  [name] apixaban (ELIQUIS) 2.5 MG TABS tablet Take 1 tablet (2.5 mg total) by mouth 2 (two) times daily.  [name] diltiazem (CARDIZEM CD) 120 MG 24 hr capsule Take 1 capsule (120 mg total) by mouth daily.  [name] donepezil (ARICEPT) 5 MG tablet Take 5 mg by mouth at bedtime.  [name] escitalopram (LEXAPRO) 10 MG tablet Take 1 tablet (10 mg total) by mouth daily.  . metoprolol succinate (TOPROL-XL) 100 MG 24 hr tablet Take 1 tablet (100 mg total) by mouth daily.  [name] omeprazole (PRILOSEC) 20 MG capsule Take 20 mg by mouth daily.    . simvastatin (ZOCOR) 20 MG tablet Take 1 tablet (20 mg total) by mouth at bedtime.  . Tamsulosin HCl (FLOMAX) 0.4 MG CAPS Take 0.4 mg by mouth daily after supper.  . torsemide (DEMADEX) 20 MG tablet Take 1 tablet (20 mg total) by mouth 2 (two) times daily as needed (for weight gain).  . [DISCONTINUED] mirtazapine (REMERON) 7.5 MG tablet Take 1 tablet (7.5 mg total) by mouth at bedtime. (Patient not taking: Reported on 09/08/2015)   No facility-administered encounter medications on file as of 09/08/2015.    Past Medical History  Diagnosis Date  . Chronic atrial fibrillation (HCC)     a. on pradaxa  . Coronary artery disease     a. s/p MI x 3;  b. s/p CABG x2  in 1999;  c. Cath 2007: LIMA->LAD patent, VG->OM 100, PCI/DES of RPL/mid RCA (cypher DES).  . Chronic obstructive pulmonary disease ([place])   . GERD (gastroesophageal reflux disease)   . Hyperlipidemia   . Hypertension   . PVD (peripheral vascular disease) ([place])   . AAA (abdominal aortic aneurysm) ([place])     a. s/p repair - 1999 @ [facility] @ time of CABG.  . Chronic diastolic CHF (congestive heart failure) ([place])     a. 01/2012 Echo:  EF 55%.  . Tachy-brady  syndrome ([place])     a. s/p PPM 2010.  [name] CKD (chronic kidney disease), stage III     a. Acute on chronic 03/2012  . Cancer ([place])     skin cancer nose  . Sleep apnea   . Broken ankle     Past Surgical History  Procedure Laterality Date  . Coronary artery bypass graft    . Abdominal aortic aneurysm repair    . Insert / replace / remove pacemaker    . Foot surgery      left foot surgery to remove cyst.  . Trigger finger release    . Skin cancer biopsy      nose  . Ankle surgery Right   . Orif ankle fracture Left 01/08/2015    Procedure: OPEN REDUCTION INTERNAL FIXATION (ORIF) ANKLE FRACTURE;  Surgeon: Earnestine Leys, MD;  Location: [facility] ORS;  Service: Orthopedics;  Laterality: Left;    Social History  reports that he quit smoking about 18 years ago. He has never used smokeless tobacco. He reports that he drinks alcohol. He reports that he does not use illicit drugs.  Family History family history includes Cerebral aneurysm in his father; Coronary artery disease in his father and mother; Diabetes in his father and mother; Hypertension in his father and mother; Stomach cancer in his mother.        Review of Systems  Constitutional: Negative.   Respiratory: Positive for shortness of breath.   Cardiovascular: Negative.   Gastrointestinal: Negative.   Musculoskeletal: Positive for gait problem.  Skin: Negative.   Neurological: Negative.   Hematological: Negative.   Psychiatric/Behavioral: Negative.   All other systems reviewed and are negative.   BP 150/90 mmHg  Pulse 70  Ht 6\' 1"  (1.854 m)  Wt 234 lb 8 oz (106.369 kg)  BMI 30.95 kg/m2  Physical Exam  Constitutional: He is oriented to person, place, and time. He appears well-developed and well-nourished.  HENT:  Head: Normocephalic.  Nose: Nose normal.  Mouth/Throat: Oropharynx is clear and moist.  Eyes: Conjunctivae are normal. Pupils are equal, round, and reactive to light.  Neck: Normal range of motion. Neck  supple. No JVD present.  Cardiovascular: Normal rate, regular rhythm, S1 normal, S2 normal, normal heart sounds and intact distal pulses.  Exam reveals no gallop and no friction rub.   No murmur heard. Pulmonary/Chest: Effort normal and breath sounds normal. No respiratory distress. He has no wheezes. He has no rales. He exhibits no tenderness.  Abdominal: Soft. Bowel sounds are normal. He exhibits no distension. There is no tenderness.  Musculoskeletal: Normal range of motion. He exhibits no edema or tenderness.  Boot on his left ankle  Lymphadenopathy:    He has no cervical adenopathy.  Neurological: He is alert and oriented to person, place, and time. Coordination normal.  Skin: Skin is warm and dry. No rash noted. No erythema.  Psychiatric: He has a normal mood and affect. His behavior is normal.  Judgment and thought content normal.      Assessment and Plan   Nursing note and vitals reviewed.

## 2015-09-08 NOTE — Assessment & Plan Note (Signed)
Chronic atrial fibrillation, He is on low-dose eliquis, Numerous falls secondary to alcohol intoxication

## 2015-09-08 NOTE — Assessment & Plan Note (Signed)
Blood pressure moderately elevated today. He reports he is not taking his medications today We'll take his medications at noon Recommended he monitor his blood pressure and call our office if this continues to run high

## 2015-09-09 ENCOUNTER — Other Ambulatory Visit: Payer: Self-pay

## 2015-09-09 DIAGNOSIS — I5032 Chronic diastolic (congestive) heart failure: Secondary | ICD-10-CM

## 2015-09-09 LAB — BASIC METABOLIC PANEL
BUN/Creatinine Ratio: 12 (ref 10–24)
BUN: 33 mg/dL — AB (ref 8–27)
CALCIUM: 9.2 mg/dL (ref 8.6–10.2)
CHLORIDE: 104 mmol/L (ref 96–106)
CO2: 20 mmol/L (ref 18–29)
CREATININE: 2.7 mg/dL — AB (ref 0.76–1.27)
GFR calc Af Amer: 25 mL/min/{1.73_m2} — ABNORMAL LOW (ref >59)
GFR calc non Af Amer: 21 mL/min/{1.73_m2} — ABNORMAL LOW (ref >59)
GLUCOSE: 93 mg/dL (ref 65–99)
Potassium: 5.2 mmol/L (ref 3.5–5.2)
Sodium: 146 mmol/L — ABNORMAL HIGH (ref 134–144)

## 2015-09-24 ENCOUNTER — Other Ambulatory Visit (INDEPENDENT_AMBULATORY_CARE_PROVIDER_SITE_OTHER): Payer: Commercial Managed Care - HMO

## 2015-09-24 DIAGNOSIS — I5032 Chronic diastolic (congestive) heart failure: Secondary | ICD-10-CM | POA: Diagnosis not present

## 2015-09-25 LAB — BASIC METABOLIC PANEL
BUN/Creatinine Ratio: 14 (ref 10–24)
BUN: 43 mg/dL — AB (ref 8–27)
CALCIUM: 9.2 mg/dL (ref 8.6–10.2)
CHLORIDE: 102 mmol/L (ref 96–106)
CO2: 22 mmol/L (ref 18–29)
Creatinine, Ser: 3.14 mg/dL — ABNORMAL HIGH (ref 0.76–1.27)
GFR, EST AFRICAN AMERICAN: 21 mL/min/{1.73_m2} — AB (ref >59)
GFR, EST NON AFRICAN AMERICAN: 18 mL/min/{1.73_m2} — AB (ref >59)
Glucose: 141 mg/dL — ABNORMAL HIGH (ref 65–99)
Potassium: 5.5 mmol/L — ABNORMAL HIGH (ref 3.5–5.2)
Sodium: 143 mmol/L (ref 134–144)

## 2015-09-30 DIAGNOSIS — R2689 Other abnormalities of gait and mobility: Secondary | ICD-10-CM | POA: Diagnosis not present

## 2015-09-30 DIAGNOSIS — I509 Heart failure, unspecified: Secondary | ICD-10-CM | POA: Diagnosis not present

## 2015-09-30 DIAGNOSIS — J449 Chronic obstructive pulmonary disease, unspecified: Secondary | ICD-10-CM | POA: Diagnosis not present

## 2015-09-30 DIAGNOSIS — I5032 Chronic diastolic (congestive) heart failure: Secondary | ICD-10-CM | POA: Diagnosis not present

## 2015-09-30 DIAGNOSIS — R2681 Unsteadiness on feet: Secondary | ICD-10-CM | POA: Diagnosis not present

## 2015-09-30 DIAGNOSIS — S82842A Displaced bimalleolar fracture of left lower leg, initial encounter for closed fracture: Secondary | ICD-10-CM | POA: Diagnosis not present

## 2015-10-06 ENCOUNTER — Telehealth: Payer: Self-pay | Admitting: Cardiovascular Disease

## 2015-10-06 ENCOUNTER — Ambulatory Visit (INDEPENDENT_AMBULATORY_CARE_PROVIDER_SITE_OTHER): Payer: Commercial Managed Care - HMO | Admitting: *Deleted

## 2015-10-06 ENCOUNTER — Other Ambulatory Visit: Payer: Self-pay | Admitting: Cardiovascular Disease

## 2015-10-06 ENCOUNTER — Encounter: Payer: Self-pay | Admitting: Internal Medicine

## 2015-10-06 DIAGNOSIS — I482 Chronic atrial fibrillation, unspecified: Secondary | ICD-10-CM

## 2015-10-06 DIAGNOSIS — Z950 Presence of cardiac pacemaker: Secondary | ICD-10-CM

## 2015-10-06 LAB — CUP PACEART INCLINIC DEVICE CHECK
Battery Impedance: 4357 Ohm
Battery Voltage: 2.69 V
Date Time Interrogation Session: 20170627123437
Implantable Lead Implant Date: 20100205
Implantable Lead Location: 753860
Implantable Lead Model: 5076
Lead Channel Impedance Value: 453 Ohm
Lead Channel Pacing Threshold Pulse Width: 0.4 ms
Lead Channel Setting Pacing Amplitude: 2.5 V
Lead Channel Setting Pacing Pulse Width: 0.4 ms
Lead Channel Setting Sensing Sensitivity: 5.6 mV
MDC IDC MSMT BATTERY REMAINING LONGEVITY: 12 mo
MDC IDC MSMT LEADCHNL RA IMPEDANCE VALUE: 0 Ohm
MDC IDC MSMT LEADCHNL RV PACING THRESHOLD AMPLITUDE: 1 V
MDC IDC MSMT LEADCHNL RV SENSING INTR AMPL: 11.2 mV
MDC IDC STAT BRADY RV PERCENT PACED: 79 %

## 2015-10-06 MED ORDER — NITROGLYCERIN 0.4 MG SL SUBL: 0.4000 mg | SUBLINGUAL_TABLET | SUBLINGUAL | Status: AC | PRN

## 2015-10-06 NOTE — Progress Notes (Signed)
Pacemaker check in clinic. Normal device function. Thresholds, sensing, impedances consistent with previous measurements. Device programmed to maximize longevity. 4 high ventricular rates noted, longest 8 sec, AF w/RVR vs. NSVT. Device programmed at appropriate safety margins. Histogram distribution appropriate for patient activity level. Device programmed to optimize intrinsic conduction. Estimated longevity 12 months (<1-23 months). Patient education completed, including use of Toad Hop adapter. Carelink on 11/05/15 (battery only, next billable 12/2015), ROV with SK in 07/2016.

## 2015-10-06 NOTE — Telephone Encounter (Signed)
I have renewed his nitroglycerin Symptoms as described in the note so like GERD or reflux GI symptoms will typically resolve with carbonation and belch If he starts to have symptoms not resolved with carbonation, would let me know

## 2015-10-06 NOTE — Telephone Encounter (Signed)
Pt in office today for device check. Reported to  [facility] during device check that he was experiencing increased angina. I s/w pt face-to-face with wife in the room. He states chest pressure has become more frequent for the past 2-3 months.  States he experiences these "episodes" 1-2 x/day for 15-20 minutes each time. Often pain resolves on its own, sometimes "I drink a big ole gulp of Pepsi, and when I burp, I feel better". Reports pain "is not bad" and rates 5/10. It does not radiate, no nausea/diaphoresis or jaw pain.  Denies any other sx.  He feels it could be acid reflux. Pt takes prilosec 20mg  qd.  May 30 OV notes, "Currently with no symptoms of angina. No further workup at this time. Continue current medication regimen." He will begin monthly checks on his pacemaker as he states "it is near the end and will need to be replaced within the next year". Pt use to have nitro sublingual prescription but it has expired and he asks if he can have another prescription. Reviewed s/s w/pt and wife that would need immediate attention. He verbalized understanding. Advised pt/wife I will forward to Dr. Rockey Situ to advise. They are agreeable w/plan w/no further questions at this time.  Forward to MD to review and advise regarding nitro prescription.

## 2015-10-07 NOTE — Telephone Encounter (Signed)
Spoke w/ pt's wife.  Advised her of Dr. Donivan Scull recommendation. She reports that he told the nurse yesterday different sx than he usually has, as he is concerned that if he were honest about his sx, that he would be put in the hospital.   She would like for Dr. Rockey Situ to know that pt "does not always tell the whole truth and he knows how contrary he is". Asked her to call back if pt's sx do not improve or if we can be of further assistance.

## 2015-10-15 DIAGNOSIS — I4891 Unspecified atrial fibrillation: Secondary | ICD-10-CM | POA: Diagnosis not present

## 2015-10-15 DIAGNOSIS — F4321 Adjustment disorder with depressed mood: Secondary | ICD-10-CM | POA: Diagnosis not present

## 2015-10-15 DIAGNOSIS — R5381 Other malaise: Secondary | ICD-10-CM | POA: Diagnosis not present

## 2015-10-15 DIAGNOSIS — R5383 Other fatigue: Secondary | ICD-10-CM | POA: Diagnosis not present

## 2015-10-15 DIAGNOSIS — I10 Essential (primary) hypertension: Secondary | ICD-10-CM | POA: Diagnosis not present

## 2015-10-15 DIAGNOSIS — N184 Chronic kidney disease, stage 4 (severe): Secondary | ICD-10-CM | POA: Diagnosis not present

## 2015-10-15 DIAGNOSIS — J449 Chronic obstructive pulmonary disease, unspecified: Secondary | ICD-10-CM | POA: Diagnosis not present

## 2015-11-05 ENCOUNTER — Ambulatory Visit (INDEPENDENT_AMBULATORY_CARE_PROVIDER_SITE_OTHER): Payer: Commercial Managed Care - HMO | Admitting: *Deleted

## 2015-11-05 DIAGNOSIS — Z950 Presence of cardiac pacemaker: Secondary | ICD-10-CM

## 2015-11-05 NOTE — Progress Notes (Signed)
Remote pacemaker transmission.   

## 2015-11-06 ENCOUNTER — Encounter: Payer: Self-pay | Admitting: Cardiology

## 2015-11-24 ENCOUNTER — Emergency Department: Payer: Commercial Managed Care - HMO

## 2015-11-24 ENCOUNTER — Inpatient Hospital Stay
Admission: EM | Admit: 2015-11-24 | Discharge: 2015-11-26 | DRG: 315 | Disposition: A | Discharge status: Home / Self Care | Payer: Commercial Managed Care - HMO | Attending: Specialist | Admitting: Specialist

## 2015-11-24 ENCOUNTER — Encounter: Payer: Self-pay | Admitting: Emergency Medicine

## 2015-11-24 DIAGNOSIS — I482 Chronic atrial fibrillation: Secondary | ICD-10-CM | POA: Diagnosis not present

## 2015-11-24 DIAGNOSIS — I5032 Chronic diastolic (congestive) heart failure: Secondary | ICD-10-CM | POA: Diagnosis not present

## 2015-11-24 DIAGNOSIS — Z85828 Personal history of other malignant neoplasm of skin: Secondary | ICD-10-CM | POA: Diagnosis not present

## 2015-11-24 DIAGNOSIS — S069X9A Unspecified intracranial injury with loss of consciousness of unspecified duration, initial encounter: Secondary | ICD-10-CM | POA: Diagnosis not present

## 2015-11-24 DIAGNOSIS — Z833 Family history of diabetes mellitus: Secondary | ICD-10-CM | POA: Diagnosis not present

## 2015-11-24 DIAGNOSIS — N179 Acute kidney failure, unspecified: Secondary | ICD-10-CM | POA: Diagnosis present

## 2015-11-24 DIAGNOSIS — I129 Hypertensive chronic kidney disease with stage 1 through stage 4 chronic kidney disease, or unspecified chronic kidney disease: Secondary | ICD-10-CM | POA: Diagnosis not present

## 2015-11-24 DIAGNOSIS — I2510 Atherosclerotic heart disease of native coronary artery without angina pectoris: Secondary | ICD-10-CM | POA: Diagnosis present

## 2015-11-24 DIAGNOSIS — I252 Old myocardial infarction: Secondary | ICD-10-CM

## 2015-11-24 DIAGNOSIS — I739 Peripheral vascular disease, unspecified: Secondary | ICD-10-CM | POA: Diagnosis present

## 2015-11-24 DIAGNOSIS — N400 Enlarged prostate without lower urinary tract symptoms: Secondary | ICD-10-CM | POA: Diagnosis present

## 2015-11-24 DIAGNOSIS — G4733 Obstructive sleep apnea (adult) (pediatric): Secondary | ICD-10-CM | POA: Diagnosis present

## 2015-11-24 DIAGNOSIS — M545 Low back pain: Secondary | ICD-10-CM | POA: Diagnosis not present

## 2015-11-24 DIAGNOSIS — Z8249 Family history of ischemic heart disease and other diseases of the circulatory system: Secondary | ICD-10-CM | POA: Diagnosis not present

## 2015-11-24 DIAGNOSIS — Z888 Allergy status to other drugs, medicaments and biological substances status: Secondary | ICD-10-CM

## 2015-11-24 DIAGNOSIS — N183 Chronic kidney disease, stage 3 (moderate): Secondary | ICD-10-CM | POA: Diagnosis present

## 2015-11-24 DIAGNOSIS — I959 Hypotension, unspecified: Principal | ICD-10-CM | POA: Diagnosis present

## 2015-11-24 DIAGNOSIS — E860 Dehydration: Secondary | ICD-10-CM

## 2015-11-24 DIAGNOSIS — Z9889 Other specified postprocedural states: Secondary | ICD-10-CM

## 2015-11-24 DIAGNOSIS — F0390 Unspecified dementia without behavioral disturbance: Secondary | ICD-10-CM | POA: Diagnosis not present

## 2015-11-24 DIAGNOSIS — Z79899 Other long term (current) drug therapy: Secondary | ICD-10-CM

## 2015-11-24 DIAGNOSIS — F10129 Alcohol abuse with intoxication, unspecified: Secondary | ICD-10-CM | POA: Diagnosis present

## 2015-11-24 DIAGNOSIS — E785 Hyperlipidemia, unspecified: Secondary | ICD-10-CM | POA: Diagnosis present

## 2015-11-24 DIAGNOSIS — K219 Gastro-esophageal reflux disease without esophagitis: Secondary | ICD-10-CM | POA: Diagnosis present

## 2015-11-24 DIAGNOSIS — J449 Chronic obstructive pulmonary disease, unspecified: Secondary | ICD-10-CM | POA: Diagnosis not present

## 2015-11-24 DIAGNOSIS — Y907 Blood alcohol level of 200-239 mg/100 ml: Secondary | ICD-10-CM | POA: Diagnosis present

## 2015-11-24 DIAGNOSIS — Z950 Presence of cardiac pacemaker: Secondary | ICD-10-CM

## 2015-11-24 DIAGNOSIS — W19XXXA Unspecified fall, initial encounter: Secondary | ICD-10-CM | POA: Diagnosis not present

## 2015-11-24 DIAGNOSIS — I4891 Unspecified atrial fibrillation: Secondary | ICD-10-CM | POA: Diagnosis not present

## 2015-11-24 DIAGNOSIS — Z8679 Personal history of other diseases of the circulatory system: Secondary | ICD-10-CM

## 2015-11-24 DIAGNOSIS — Z800 Family history of malignant neoplasm of digestive organs: Secondary | ICD-10-CM | POA: Diagnosis not present

## 2015-11-24 DIAGNOSIS — Z951 Presence of aortocoronary bypass graft: Secondary | ICD-10-CM | POA: Diagnosis not present

## 2015-11-24 DIAGNOSIS — R55 Syncope and collapse: Secondary | ICD-10-CM | POA: Diagnosis present

## 2015-11-24 DIAGNOSIS — N189 Chronic kidney disease, unspecified: Secondary | ICD-10-CM

## 2015-11-24 DIAGNOSIS — Z87891 Personal history of nicotine dependence: Secondary | ICD-10-CM

## 2015-11-24 DIAGNOSIS — A419 Sepsis, unspecified organism: Secondary | ICD-10-CM | POA: Diagnosis not present

## 2015-11-24 DIAGNOSIS — F10929 Alcohol use, unspecified with intoxication, unspecified: Secondary | ICD-10-CM | POA: Diagnosis not present

## 2015-11-24 DIAGNOSIS — I130 Hypertensive heart and chronic kidney disease with heart failure and stage 1 through stage 4 chronic kidney disease, or unspecified chronic kidney disease: Secondary | ICD-10-CM | POA: Diagnosis present

## 2015-11-24 LAB — COMPREHENSIVE METABOLIC PANEL
ALBUMIN: 3.5 g/dL (ref 3.5–5.0)
ALT: 11 U/L — AB (ref 17–63)
AST: 17 U/L (ref 15–41)
Alkaline Phosphatase: 60 U/L (ref 38–126)
Anion gap: 14 (ref 5–15)
BILIRUBIN TOTAL: 0.4 mg/dL (ref 0.3–1.2)
BUN: 43 mg/dL — ABNORMAL HIGH (ref 6–20)
CALCIUM: 8.7 mg/dL — AB (ref 8.9–10.3)
CO2: 19 mmol/L — ABNORMAL LOW (ref 22–32)
Chloride: 106 mmol/L (ref 101–111)
Creatinine, Ser: 4.17 mg/dL — ABNORMAL HIGH (ref 0.61–1.24)
GFR calc Af Amer: 14 mL/min — ABNORMAL LOW (ref >60)
GFR, EST NON AFRICAN AMERICAN: 12 mL/min — AB (ref >60)
Glucose, Bld: 81 mg/dL (ref 65–99)
POTASSIUM: 3.8 mmol/L (ref 3.5–5.1)
Sodium: 139 mmol/L (ref 135–145)
Total Protein: 6.6 g/dL (ref 6.5–8.1)

## 2015-11-24 LAB — URINALYSIS COMPLETE WITH MICROSCOPIC (ARMC ONLY)
BILIRUBIN URINE: NEGATIVE
Bacteria, UA: NONE SEEN
Glucose, UA: NEGATIVE mg/dL
KETONES UR: NEGATIVE mg/dL
NITRITE: NEGATIVE
PH: 5 (ref 5.0–8.0)
Protein, ur: 30 mg/dL — AB
SPECIFIC GRAVITY, URINE: 1.006 (ref 1.005–1.030)
Squamous Epithelial / LPF: NONE SEEN

## 2015-11-24 LAB — CBC WITH DIFFERENTIAL/PLATELET
BASOS ABS: 0.1 10*3/uL (ref 0–0.1)
BASOS PCT: 1 %
EOS ABS: 0.2 10*3/uL (ref 0–0.7)
Eosinophils Relative: 3 %
HCT: 34.2 % — ABNORMAL LOW (ref 40.0–52.0)
HEMOGLOBIN: 11.6 g/dL — AB (ref 13.0–18.0)
LYMPHS ABS: 1.8 10*3/uL (ref 1.0–3.6)
Lymphocytes Relative: 23 %
MCH: 31.2 pg (ref 26.0–34.0)
MCHC: 33.9 g/dL (ref 32.0–36.0)
MCV: 91.9 fL (ref 80.0–100.0)
Monocytes Absolute: 0.8 10*3/uL (ref 0.2–1.0)
Monocytes Relative: 10 %
NEUTROS PCT: 63 %
Neutro Abs: 4.9 10*3/uL (ref 1.4–6.5)
Platelets: 203 10*3/uL (ref 150–440)
RBC: 3.72 MIL/uL — AB (ref 4.40–5.90)
RDW: 16.9 % — ABNORMAL HIGH (ref 11.5–14.5)
WBC: 7.8 10*3/uL (ref 3.8–10.6)

## 2015-11-24 LAB — ETHANOL: Alcohol, Ethyl (B): 212 mg/dL — ABNORMAL HIGH (ref <5)

## 2015-11-24 LAB — PROTIME-INR
INR: 1.33
PROTHROMBIN TIME: 16.6 s — AB (ref 11.4–15.2)

## 2015-11-24 LAB — TROPONIN I
Troponin I: <0.03 ng/mL (ref <0.03)
Troponin I: <0.03 ng/mL (ref <0.03)

## 2015-11-24 LAB — LACTIC ACID, PLASMA
LACTIC ACID, VENOUS: 3 mmol/L — AB (ref 0.5–1.9)
Lactic Acid, Venous: 1.3 mmol/L (ref 0.5–1.9)

## 2015-11-24 MED ORDER — ONDANSETRON HCL 4 MG/2ML IJ SOLN: 4.0000 mg | Freq: Four times a day (QID) | INTRAMUSCULAR | Status: DC | PRN

## 2015-11-24 MED ORDER — DEXTROSE 5 % IV SOLN
1.0000 g | INTRAVENOUS | Status: DC
  Administered 2015-11-25: 1 g via INTRAVENOUS
  Filled 2015-11-24 (×2): qty 10

## 2015-11-24 MED ORDER — DOCUSATE SODIUM 100 MG PO CAPS
100.0000 mg | ORAL_CAPSULE | Freq: Two times a day (BID) | ORAL | Status: DC
  Administered 2015-11-24 – 2015-11-26 (×3): 100 mg via ORAL
  Filled 2015-11-24 (×4): qty 1

## 2015-11-24 MED ORDER — DONEPEZIL HCL 5 MG PO TABS
5.0000 mg | ORAL_TABLET | Freq: Every day | ORAL | Status: DC
Start: 2015-11-24 — End: 2015-11-26
  Administered 2015-11-24 – 2015-11-25 (×2): 5 mg via ORAL
  Filled 2015-11-24 (×2): qty 1

## 2015-11-24 MED ORDER — SODIUM CHLORIDE 0.9 % IV BOLUS (SEPSIS)
500.0000 mL | Freq: Once | INTRAVENOUS | Status: AC
  Administered 2015-11-24: 500 mL via INTRAVENOUS

## 2015-11-24 MED ORDER — ONDANSETRON HCL 4 MG PO TABS: 4.0000 mg | ORAL_TABLET | Freq: Four times a day (QID) | ORAL | Status: DC | PRN

## 2015-11-24 MED ORDER — ESCITALOPRAM OXALATE 10 MG PO TABS
10.0000 mg | ORAL_TABLET | Freq: Every day | ORAL | Status: DC
  Administered 2015-11-25 – 2015-11-26 (×2): 10 mg via ORAL
  Filled 2015-11-24 (×2): qty 1

## 2015-11-24 MED ORDER — PANTOPRAZOLE SODIUM 40 MG PO TBEC
40.0000 mg | DELAYED_RELEASE_TABLET | Freq: Every day | ORAL | Status: DC
  Administered 2015-11-25 – 2015-11-26 (×2): 40 mg via ORAL
  Filled 2015-11-24 (×2): qty 1

## 2015-11-24 MED ORDER — SODIUM CHLORIDE 0.9 % IV SOLN
INTRAVENOUS | Status: DC
  Administered 2015-11-24 – 2015-11-25 (×3): via INTRAVENOUS

## 2015-11-24 MED ORDER — SIMVASTATIN 40 MG PO TABS
20.0000 mg | ORAL_TABLET | Freq: Every day | ORAL | Status: DC
  Administered 2015-11-24 – 2015-11-25 (×2): 20 mg via ORAL
  Filled 2015-11-24 (×3): qty 1

## 2015-11-24 MED ORDER — TRAZODONE HCL 50 MG PO TABS: 25.0000 mg | ORAL_TABLET | Freq: Every evening | ORAL | Status: DC | PRN

## 2015-11-24 MED ORDER — BISACODYL 5 MG PO TBEC: 5.0000 mg | DELAYED_RELEASE_TABLET | Freq: Every day | ORAL | Status: DC | PRN

## 2015-11-24 MED ORDER — TAMSULOSIN HCL 0.4 MG PO CAPS
0.4000 mg | ORAL_CAPSULE | Freq: Every day | ORAL | Status: DC
Start: 2015-11-24 — End: 2015-11-26
  Administered 2015-11-25: 0.4 mg via ORAL
  Filled 2015-11-24: qty 1

## 2015-11-24 MED ORDER — ACETAMINOPHEN 650 MG RE SUPP: 650.0000 mg | Freq: Four times a day (QID) | RECTAL | Status: DC | PRN

## 2015-11-24 MED ORDER — APIXABAN 2.5 MG PO TABS
2.5000 mg | ORAL_TABLET | Freq: Two times a day (BID) | ORAL | Status: DC
Start: 2015-11-24 — End: 2015-11-26
  Administered 2015-11-24 – 2015-11-26 (×4): 2.5 mg via ORAL
  Filled 2015-11-24 (×5): qty 1

## 2015-11-24 MED ORDER — NITROGLYCERIN 0.4 MG SL SUBL: 0.4000 mg | SUBLINGUAL_TABLET | SUBLINGUAL | Status: DC | PRN

## 2015-11-24 MED ORDER — SODIUM CHLORIDE 0.9 % IV BOLUS (SEPSIS)
1000.0000 mL | Freq: Once | INTRAVENOUS | Status: AC
  Administered 2015-11-24: 1000 mL via INTRAVENOUS

## 2015-11-24 MED ORDER — ACETAMINOPHEN 325 MG PO TABS: 650.0000 mg | ORAL_TABLET | Freq: Four times a day (QID) | ORAL | Status: DC | PRN

## 2015-11-24 MED ORDER — AZITHROMYCIN 500 MG IV SOLR
500.0000 mg | INTRAVENOUS | Status: DC
  Administered 2015-11-25: 500 mg via INTRAVENOUS
  Filled 2015-11-24 (×2): qty 500

## 2015-11-24 NOTE — ED Triage Notes (Addendum)
Pt arrived via ems from where he was found on the sidewalk from a witnessed fall. Pt is unaware of what happened only remembers walking down the street and coming back home. Upon assessment pt is alert and oriented x 4. In no apparent distress.

## 2015-11-24 NOTE — H&P (Signed)
[facility] NAME: [name]    MR#:  [id number]  DATE OF BIRTH:  Feb 10, 1934  DATE OF ADMISSION:  11/24/2015  PRIMARY CARE PHYSICIAN: Maryland Pink, MD   REQUESTING/REFERRING PHYSICIAN: Dr. Delman Kitten  CHIEF COMPLAINT: syncope   Chief Complaint  Patient presents with  . Fall    HISTORY OF PRESENT ILLNESS:  [name]  is a 82 y.o. male with a known history of Essential hypertension, hyperlipidemia, chronic atrial fibrillation, chronic diastolic heart failure comes in because of syncope. Patient Family found him on the sidewalk, called EMS. Patient had another syncope in ambulance. Blood pressure 70/40 when EMS arrived. Patient felt dizzy and then he passed out. No chest pain or headache. Patient to the found to have blood alcohol level of more than 200. Also found to have acute on chronic renal failure PAST MEDICAL HISTORY:   Past Medical History:  Diagnosis Date  . AAA (abdominal aortic aneurysm) ([place])    a. s/p repair - 1999 @ [facility] @ time of CABG.  . Broken ankle   . Cancer ([place])    skin cancer nose  . Chronic atrial fibrillation ([facility])    a. on pradaxa  . Chronic diastolic CHF (congestive heart failure) ([place])    a. 01/2012 Echo:  EF 55%.  . Chronic obstructive pulmonary disease ([place])   . CKD (chronic kidney disease), stage III    a. Acute on chronic 03/2012  . Coronary artery disease    a. s/p MI x 3;  b. s/p CABG x2 in 1999;  c. Cath 2007: LIMA->LAD patent, VG->OM 100, PCI/DES of RPL/mid RCA (cypher DES).  . GERD (gastroesophageal reflux disease)   . Hyperlipidemia   . Hypertension   . PVD (peripheral vascular disease) ([place])   . Sleep apnea   . Tachy-brady syndrome ([place])    a. s/p PPM 2010.    PAST SURGICAL HISTOIRY:   Past Surgical History:  Procedure Laterality Date  . ABDOMINAL AORTIC ANEURYSM REPAIR    . ANKLE SURGERY Right   . CORONARY ARTERY BYPASS GRAFT    . FOOT SURGERY     left foot  surgery to remove cyst.  . INSERT / REPLACE / REMOVE PACEMAKER    . ORIF ANKLE FRACTURE Left 01/08/2015   Procedure: OPEN REDUCTION INTERNAL FIXATION (ORIF) ANKLE FRACTURE;  Surgeon: Earnestine Leys, MD;  Location: [facility] ORS;  Service: Orthopedics;  Laterality: Left;  . skin cancer biopsy     nose  . TRIGGER FINGER RELEASE      SOCIAL HISTORY:   Social History  Substance Use Topics  . Smoking status: Former Smoker    Packs/day: 1.00    Years: 20.00    Quit date: 04/11/1997  . Smokeless tobacco: Never Used  . Alcohol use Yes     Comment: Rare    FAMILY HISTORY:   Family History  Problem Relation Age of Onset  . Stomach cancer Mother     died @ 67  . Diabetes Mother   . Hypertension Mother   . Coronary artery disease Mother   . Cerebral aneurysm Father     died @ 88  . Diabetes Father   . Hypertension Father   . Coronary artery disease Father     DRUG ALLERGIES:   Allergies  Allergen Reactions  . Lipitor [Atorvastatin] Other (See Comments)    Arm pain    REVIEW OF SYSTEMS:  CONSTITUTIONAL: No fever, fatigue or  weakness.  EYES: No blurred or double vision.  EARS, NOSE, AND THROAT: No tinnitus or ear pain.  RESPIRATORY: No cough, shortness of breath, wheezing or hemoptysis.  CARDIOVASCULAR: No chest pain, orthopnea, edema.  GASTROINTESTINAL: No nausea, vomiting, diarrhea or abdominal pain.  GENITOURINARY: No dysuria, hematuria.  ENDOCRINE: No polyuria, nocturia,  HEMATOLOGY: No anemia, easy bruising or bleeding SKIN: No rash or lesion. MUSCULOSKELETAL: No joint pain or arthritis.   NEUROLOGIC: No tingling, numbness, weakness.  PSYCHIATRY: No anxiety or depression.   MEDICATIONS AT HOME:   Prior to Admission medications   Medication Sig Start Date End Date Taking? Authorizing Provider  acetaminophen (TYLENOL) 325 MG tablet Take 650 mg by mouth every 6 (six) hours as needed.    Historical Provider, MD  apixaban (ELIQUIS) 2.5 MG TABS tablet Take 1 tablet (2.5 mg  total) by mouth 2 (two) times daily. 09/03/14   Minna Merritts, MD  diltiazem (CARDIZEM CD) 120 MG 24 hr capsule Take 1 capsule (120 mg total) by mouth daily. 07/15/14   Deboraha Sprang, MD  donepezil (ARICEPT) 5 MG tablet Take 5 mg by mouth at bedtime.    Historical Provider, MD  escitalopram (LEXAPRO) 10 MG tablet Take 1 tablet (10 mg total) by mouth daily. 08/19/15   Gonzella Lex, MD  metoprolol succinate (TOPROL-XL) 100 MG 24 hr tablet Take 1 tablet (100 mg total) by mouth daily. 06/03/13   Deboraha Sprang, MD  nitroGLYCERIN (NITROSTAT) 0.4 MG SL tablet Place 1 tablet (0.4 mg total) under the tongue every 5 (five) minutes as needed for chest pain. 10/06/15   Minna Merritts, MD  omeprazole (PRILOSEC) 20 MG capsule Take 20 mg by mouth daily.      Historical Provider, MD  simvastatin (ZOCOR) 20 MG tablet Take 1 tablet (20 mg total) by mouth at bedtime. 11/13/13   Minna Merritts, MD  Tamsulosin HCl (FLOMAX) 0.4 MG CAPS Take 0.4 mg by mouth daily after supper.    Historical Provider, MD  torsemide (DEMADEX) 20 MG tablet Take 1 tablet (20 mg total) by mouth 2 (two) times daily as needed (for weight gain). 03/11/15   Minna Merritts, MD      VITAL SIGNS:  Blood pressure (!) 76/48, pulse 61, temperature 97.4 F (36.3 C), temperature source Axillary, resp. rate 18, height 6' (1.829 m), weight 106.1 kg (234 lb), SpO2 93 %.  PHYSICAL EXAMINATION:  GENERAL:  82 y.o.-year-old patient lying in the bed with no acute distress.  EYES: Pupils equal, round, reactive to light and accommodation. No scleral icterus. Extraocular muscles intact.  HEENT: Head atraumatic, normocephalic. Oropharynx and nasopharynx clear.  NECK:  Supple, no jugular venous distention. No thyroid enlargement, no tenderness.  LUNGS: Normal breath sounds bilaterally, no wheezing, rales,rhonchi or crepitation. No use of accessory muscles of respiration.  CARDIOVASCULAR: S1, S2 normal. No murmurs, rubs, or gallops.  ABDOMEN: Soft,  nontender, nondistended. Bowel sounds present. No organomegaly or mass.  EXTREMITIES: No pedal edema, cyanosis, or clubbing.  NEUROLOGIC: Cranial nerves II through XII are intact. Muscle strength 5/5 in all extremities. Sensation intact. Gait not checked.  PSYCHIATRIC: The patient is alert and oriented x 3.  SKIN: No obvious rash, lesion, or ulcer.   LABORATORY PANEL:   CBC  Recent Labs Lab 11/24/15 1514  WBC 7.8  HGB 11.6*  HCT 34.2*  PLT 203   ------------------------------------------------------------------------------------------------------------------  Chemistries   Recent Labs Lab 11/24/15 1514  NA 139  K 3.8  CL 106  CO2 19*  GLUCOSE 81  BUN 43*  CREATININE 4.17*  CALCIUM 8.7*  AST 17  ALT 11*  ALKPHOS 60  BILITOT 0.4   ------------------------------------------------------------------------------------------------------------------  Cardiac Enzymes  Recent Labs Lab 11/24/15 1514  TROPONINI <0.03   ------------------------------------------------------------------------------------------------------------------  RADIOLOGY:  Ct Head Wo Contrast  Result Date: 11/24/2015 CLINICAL DATA:  Syncope, fall EXAM: CT HEAD WITHOUT CONTRAST CT CERVICAL SPINE WITHOUT CONTRAST TECHNIQUE: Multidetector CT imaging of the head and cervical spine was performed following the standard protocol without intravenous contrast. Multiplanar CT image reconstructions of the cervical spine were also generated. COMPARISON:  CT head 08/07/2015 FINDINGS: CT HEAD FINDINGS Moderate atrophy.  Negative for hydrocephalus. Chronic microvascular ischemic changes in the white matter. Negative for acute infarct. Negative for hemorrhage or mass. No shift of the midline structures Negative for acute skull fracture. Surgical repair of left orbital floor fracture with metal mesh. CT CERVICAL SPINE FINDINGS ACDF C4 through C7 with hardware.  Solid bony fusion C4-5 and C5-6. Mild anterior listhesis C3-4  with associated disc and facet degeneration. Mild anterior listhesis C7-T1 with associated facet and disc degeneration. Negative for fracture. Carotid atherosclerotic disease with calcification of a moderate degree. No soft tissue mass lesion. IMPRESSION: Atrophy and chronic microvascular ischemia. No acute intracranial abnormality Cervical fusion C4 through C6. Cervical disc and facet degeneration. Negative for cervical spine fracture. Electronically Signed   By: Franchot Gallo M.D.   On: 11/24/2015 16:16   Ct Cervical Spine Wo Contrast  Result Date: 11/24/2015 CLINICAL DATA:  Syncope, fall EXAM: CT HEAD WITHOUT CONTRAST CT CERVICAL SPINE WITHOUT CONTRAST TECHNIQUE: Multidetector CT imaging of the head and cervical spine was performed following the standard protocol without intravenous contrast. Multiplanar CT image reconstructions of the cervical spine were also generated. COMPARISON:  CT head 08/07/2015 FINDINGS: CT HEAD FINDINGS Moderate atrophy.  Negative for hydrocephalus. Chronic microvascular ischemic changes in the white matter. Negative for acute infarct. Negative for hemorrhage or mass. No shift of the midline structures Negative for acute skull fracture. Surgical repair of left orbital floor fracture with metal mesh. CT CERVICAL SPINE FINDINGS ACDF C4 through C7 with hardware.  Solid bony fusion C4-5 and C5-6. Mild anterior listhesis C3-4 with associated disc and facet degeneration. Mild anterior listhesis C7-T1 with associated facet and disc degeneration. Negative for fracture. Carotid atherosclerotic disease with calcification of a moderate degree. No soft tissue mass lesion. IMPRESSION: Atrophy and chronic microvascular ischemia. No acute intracranial abnormality Cervical fusion C4 through C6. Cervical disc and facet degeneration. Negative for cervical spine fracture. Electronically Signed   By: Franchot Gallo M.D.   On: 11/24/2015 16:16   Dg Chest Port 1 View  Result Date: 11/24/2015 CLINICAL  DATA:  Fall, syncope EXAM: PORTABLE CHEST 1 VIEW COMPARISON:  08/19/2015 FINDINGS: Postop CABG.  Single lead pacemaker unchanged. Lungs are clear without infiltrate or effusion. Negative for heart failure or effusion. IMPRESSION: No active disease. Electronically Signed   By: Franchot Gallo M.D.   On: 11/24/2015 16:10    EKG:   Orders placed or performed during the hospital encounter of 11/24/15  . ED EKG 12-Lead  . ED EKG 12-Lead  . EKG 12-Lead  . EKG 12-Lead   Ventricular pacing.66 bpm.  IMPRESSION AND PLAN:   Syncope  Likely due to hypotension;improved with fluids.hold torsemide,cardizem,metoprolol.monitor on tele  ,monitor  For  arrythmia. 2.hypotension ,likley due to sepsis;unknown cause;has been having cough ,;possible early bronchitis;continue Rocephin and Zithromax 3,acute on chronic renal failure due to dehydration;continue IV hydration,hold  torsemide 4.alcohol intoxication;continue CIWA  Protocol 5.chronic afib;coninue eliquis;hold  Bblocker GI and DVT prophylaxis. Pt is upset that her family left as soon they found out that blood alcohol level is high ,the daughter took the patient car keys.   All the records are reviewed and case discussed with ED provider. Management plans discussed with the patient, family and they are in agreement.  CODE STATUS:  full  TOTAL TIME TAKING CARE OF THIS PATIENT: 87minutes.    Epifanio Lesches M.D on 11/24/2015 at 5:31 PM  Between 7am to 6pm - Pager - [phone]  After 6pm go to [email or web address] - password [email or web address] [facility]  Office  [phone]  CC: Primary care physician; Maryland Pink, MD  Note: This dictation was prepared with Dragon dictation along with smaller phrase technology. Any transcriptional errors that result from this process are unintentional.

## 2015-11-24 NOTE — ED Provider Notes (Signed)
[facility] Emergency Department Provider Note   ____________________________________________   First MD Initiated Contact with Patient 11/24/15 1508     (approximate)  I have reviewed the triage vital signs and the nursing notes.   HISTORY  Chief Complaint Fall History limited as patient can't recall   HPI [name] is a 82 y.o. male presents for evaluation after witnessed syncopal episode.  History is limited.  Patient reports he was just walking and all of a sudden woke up on the ground with his neighbor they're having passed out.  Denies any pain, no chest pain, no headache. Does not know why he fell.  Of note the patient's family reports that when they left for a clinic appointment earlier he seemed like he had been drinking, and was very sleepy sitting in his recliner. They been encouraging him not to use any alcohol and he promised he had not, but family is suspicious he is getting dehydrated from drinking heavily, to the point that they've had to take away his car keys.  On arrival the patient reports he has no complaint.    Past Medical History:  Diagnosis Date  . AAA (abdominal aortic aneurysm) ([place])    a. s/p repair - 1999 @ [facility] @ time of CABG.  . Broken ankle   . Cancer ([place])    skin cancer nose  . Chronic atrial fibrillation (HCC)    a. on pradaxa  . Chronic diastolic CHF (congestive heart failure) ([place])    a. 01/2012 Echo:  EF 55%.  . Chronic obstructive pulmonary disease ([place])   . CKD (chronic kidney disease), stage III    a. Acute on chronic 03/2012  . Coronary artery disease    a. s/p MI x 3;  b. s/p CABG x2 in 1999;  c. Cath 2007: LIMA->LAD patent, VG->OM 100, PCI/DES of RPL/mid RCA (cypher DES).  . GERD (gastroesophageal reflux disease)   . Hyperlipidemia   . Hypertension   . PVD (peripheral vascular disease) ([place])   . Sleep apnea   . Tachy-brady syndrome ([place])    a. s/p PPM 2010.    Patient Active Problem  List   Diagnosis Date Noted  . Depression, major, single episode, severe ([place]) 08/18/2015  . Alcohol abuse 08/18/2015  . Suicidal ideation 08/18/2015  . Bimalleolar fracture of left ankle 01/07/2015  . COPD exacerbation ([place]) 08/22/2014  . Coronary artery disease   . Chronic atrial fibrillation ([place])   . Chronic diastolic CHF (congestive heart failure) ([place])   . Tachy-brady syndrome ([place])   . CKD (chronic kidney disease), stage III 04/09/2012  . Chronic diastolic heart failure ([place]) 01/26/2012  . Chest pain 09/23/2010  . Hyperlipidemia 06/12/2009  . Essential hypertension 06/12/2009  . Coronary atherosclerosis of artery bypass graft 06/12/2009  . ATRIAL FIBRILLATION 06/12/2009  . AAA 06/12/2009  . COPD 06/12/2009  . GERD 06/12/2009  . PACEMAKER, PERMANENT 06/12/2009    Past Surgical History:  Procedure Laterality Date  . ABDOMINAL AORTIC ANEURYSM REPAIR    . ANKLE SURGERY Right   . CORONARY ARTERY BYPASS GRAFT    . FOOT SURGERY     left foot surgery to remove cyst.  . INSERT / REPLACE / REMOVE PACEMAKER    . ORIF ANKLE FRACTURE Left 01/08/2015   Procedure: OPEN REDUCTION INTERNAL FIXATION (ORIF) ANKLE FRACTURE;  Surgeon: Earnestine Leys, MD;  Location: [facility] ORS;  Service: Orthopedics;  Laterality: Left;  . skin cancer biopsy     nose  .  TRIGGER FINGER RELEASE      Prior to Admission medications   Medication Sig Start Date End Date Taking? Authorizing Provider  acetaminophen (TYLENOL) 325 MG tablet Take 650 mg by mouth every 6 (six) hours as needed.    Historical Provider, MD  apixaban (ELIQUIS) 2.5 MG TABS tablet Take 1 tablet (2.5 mg total) by mouth 2 (two) times daily. 09/03/14   Minna Merritts, MD  diltiazem (CARDIZEM CD) 120 MG 24 hr capsule Take 1 capsule (120 mg total) by mouth daily. 07/15/14   Deboraha Sprang, MD  donepezil (ARICEPT) 5 MG tablet Take 5 mg by mouth at bedtime.    Historical Provider, MD  escitalopram (LEXAPRO) 10 MG tablet Take 1 tablet (10 mg total) by  mouth daily. 08/19/15   Gonzella Lex, MD  metoprolol succinate (TOPROL-XL) 100 MG 24 hr tablet Take 1 tablet (100 mg total) by mouth daily. 06/03/13   Deboraha Sprang, MD  nitroGLYCERIN (NITROSTAT) 0.4 MG SL tablet Place 1 tablet (0.4 mg total) under the tongue every 5 (five) minutes as needed for chest pain. 10/06/15   Minna Merritts, MD  omeprazole (PRILOSEC) 20 MG capsule Take 20 mg by mouth daily.      Historical Provider, MD  simvastatin (ZOCOR) 20 MG tablet Take 1 tablet (20 mg total) by mouth at bedtime. 11/13/13   Minna Merritts, MD  Tamsulosin HCl (FLOMAX) 0.4 MG CAPS Take 0.4 mg by mouth daily after supper.    Historical Provider, MD  torsemide (DEMADEX) 20 MG tablet Take 1 tablet (20 mg total) by mouth 2 (two) times daily as needed (for weight gain). 03/11/15   Minna Merritts, MD    Allergies Lipitor [atorvastatin]  Family History  Problem Relation Age of Onset  . Stomach cancer Mother     died @ 4  . Diabetes Mother   . Hypertension Mother   . Coronary artery disease Mother   . Cerebral aneurysm Father     died @ 37  . Diabetes Father   . Hypertension Father   . Coronary artery disease Father     Social History Social History  Substance Use Topics  . Smoking status: Former Smoker    Packs/day: 1.00    Years: 20.00    Quit date: 04/11/1997  . Smokeless tobacco: Never Used  . Alcohol use Yes     Comment: Rare    Review of Systems Constitutional: No fever/chills Eyes: No visual changes. ENT: No sore throat. Cardiovascular: Denies chest pain. Respiratory: Denies shortness of breath. Gastrointestinal: No abdominal pain.  No nausea, no vomiting.   Genitourinary: Negative for dysuria. Musculoskeletal: Negative for back pain. Skin: Negative for rash. Neurological: Negative for headaches, focal weakness or numbness.  10-point ROS otherwise negative.  ____________________________________________   PHYSICAL EXAM:  VITAL SIGNS: ED Triage Vitals  Enc Vitals  Group     BP 11/24/15 1510 (!) 76/48     Pulse Rate 11/24/15 1510 61     Resp 11/24/15 1510 18     Temp 11/24/15 1510 97.4 F (36.3 C)     Temp Source 11/24/15 1510 Axillary     SpO2 11/24/15 1510 93 %     Weight 11/24/15 1508 234 lb (106.1 kg)     Height 11/24/15 1508 6' (1.829 m)     Head Circumference --      Peak Flow --      Pain Score --      Pain Loc --  Pain Edu? --      Excl. in [place]? --     Constitutional: Alert and oriented. Well appearing and in no acute distress. Eyes: Conjunctivae are Slightly injected. PERRL. EOMI. Head: Atraumatic. Nose: No congestion/rhinnorhea. Mouth/Throat: Mucous membranes are moist.  Oropharynx non-erythematous. Neck: No stridor.   Cardiovascular: Normal rate, regular rhythm. Grossly normal heart sounds.  Good peripheral circulation. Respiratory: Normal respiratory effort.  No retractions. Lungs CTAB. Gastrointestinal: Soft and nontender. No distention.  Musculoskeletal: No lower extremity tenderness nor edema.   Neurologic:  Normal speech and language. No gross focal neurologic deficits are appreciated. Normal cranial nerve exam. Moves all extremities with normal strength. Question if he just has mildly slurred speech, and the slight bilateral upper extremity ataxia. Skin:  Skin is warm, dry and intact. No rash noted. Psychiatric: Mood and affect are normal.   ____________________________________________   LABS (all labs ordered are listed, but only abnormal results are displayed)  Labs Reviewed  COMPREHENSIVE METABOLIC PANEL - Abnormal; Notable for the following:       Result Value   CO2 19 (*)    BUN 43 (*)    Creatinine, Ser 4.17 (*)    Calcium 8.7 (*)    ALT 11 (*)    GFR calc non Af Amer 12 (*)    GFR calc Af Amer 14 (*)    All other components within normal limits  CBC WITH DIFFERENTIAL/PLATELET - Abnormal; Notable for the following:    RBC 3.72 (*)    Hemoglobin 11.6 (*)    HCT 34.2 (*)    RDW 16.9 (*)    All other  components within normal limits  PROTIME-INR - Abnormal; Notable for the following:    Prothrombin Time 16.6 (*)    All other components within normal limits  ETHANOL - Abnormal; Notable for the following:    Alcohol, Ethyl (B) 212 (*)    All other components within normal limits  TROPONIN I  LACTIC ACID, PLASMA  LACTIC ACID, PLASMA  URINALYSIS COMPLETEWITH MICROSCOPIC ([facility] ONLY)   ____________________________________________  EKG  Reviewed and interpreted by me at 1506 Heart rate 65 Rate is 150 QTc 480 Ventricular pacemaker-induced left bundle branch block without evidence of clear ischemia. ____________________________________________  RADIOLOGY  Ct Head Wo Contrast  Result Date: 11/24/2015 CLINICAL DATA:  Syncope, fall EXAM: CT HEAD WITHOUT CONTRAST CT CERVICAL SPINE WITHOUT CONTRAST TECHNIQUE: Multidetector CT imaging of the head and cervical spine was performed following the standard protocol without intravenous contrast. Multiplanar CT image reconstructions of the cervical spine were also generated. COMPARISON:  CT head 08/07/2015 FINDINGS: CT HEAD FINDINGS Moderate atrophy.  Negative for hydrocephalus. Chronic microvascular ischemic changes in the white matter. Negative for acute infarct. Negative for hemorrhage or mass. No shift of the midline structures Negative for acute skull fracture. Surgical repair of left orbital floor fracture with metal mesh. CT CERVICAL SPINE FINDINGS ACDF C4 through C7 with hardware.  Solid bony fusion C4-5 and C5-6. Mild anterior listhesis C3-4 with associated disc and facet degeneration. Mild anterior listhesis C7-T1 with associated facet and disc degeneration. Negative for fracture. Carotid atherosclerotic disease with calcification of a moderate degree. No soft tissue mass lesion. IMPRESSION: Atrophy and chronic microvascular ischemia. No acute intracranial abnormality Cervical fusion C4 through C6. Cervical disc and facet degeneration. Negative  for cervical spine fracture. Electronically Signed   By: Franchot Gallo M.D.   On: 11/24/2015 16:16   Ct Cervical Spine Wo Contrast  Result Date: 11/24/2015 CLINICAL DATA:  Syncope, fall EXAM: CT HEAD WITHOUT CONTRAST CT CERVICAL SPINE WITHOUT CONTRAST TECHNIQUE: Multidetector CT imaging of the head and cervical spine was performed following the standard protocol without intravenous contrast. Multiplanar CT image reconstructions of the cervical spine were also generated. COMPARISON:  CT head 08/07/2015 FINDINGS: CT HEAD FINDINGS Moderate atrophy.  Negative for hydrocephalus. Chronic microvascular ischemic changes in the white matter. Negative for acute infarct. Negative for hemorrhage or mass. No shift of the midline structures Negative for acute skull fracture. Surgical repair of left orbital floor fracture with metal mesh. CT CERVICAL SPINE FINDINGS ACDF C4 through C7 with hardware.  Solid bony fusion C4-5 and C5-6. Mild anterior listhesis C3-4 with associated disc and facet degeneration. Mild anterior listhesis C7-T1 with associated facet and disc degeneration. Negative for fracture. Carotid atherosclerotic disease with calcification of a moderate degree. No soft tissue mass lesion. IMPRESSION: Atrophy and chronic microvascular ischemia. No acute intracranial abnormality Cervical fusion C4 through C6. Cervical disc and facet degeneration. Negative for cervical spine fracture. Electronically Signed   By: Franchot Gallo M.D.   On: 11/24/2015 16:16   Dg Chest Port 1 View  Result Date: 11/24/2015 CLINICAL DATA:  Fall, syncope EXAM: PORTABLE CHEST 1 VIEW COMPARISON:  08/19/2015 FINDINGS: Postop CABG.  Single lead pacemaker unchanged. Lungs are clear without infiltrate or effusion. Negative for heart failure or effusion. IMPRESSION: No active disease. Electronically Signed   By: Franchot Gallo M.D.   On: 11/24/2015 16:10    ____________________________________________   PROCEDURES  Procedure(s)  performed: None  Procedures  Critical Care performed: Yes, see critical care note(s) CRITICAL CARE Performed by: Delman Kitten  Presents initially with hypotension, blood pressure less than 80 requiring emergent evaluation.  Total critical care time: 40 minutes  Critical care time was exclusive of separately billable procedures and treating other patients.  Critical care was necessary to treat or prevent imminent or life-threatening deterioration.  Critical care was time spent personally by me on the following activities: development of treatment plan with patient and/or surrogate as well as nursing, discussions with consultants, evaluation of patient's response to treatment, examination of patient, obtaining history from patient or surrogate, ordering and performing treatments and interventions, ordering and review of laboratory studies, ordering and review of radiographic studies, pulse oximetry and re-evaluation of patient's condition.  ____________________________________________   INITIAL IMPRESSION / ASSESSMENT AND PLAN / ED COURSE  Pertinent labs & imaging results that were available during my care of the patient were reviewed by me and considered in my medical decision making (see chart for details).  Patient presents for staple episode. Neurologically intact, and appears no evidence of injury. Denies cardiac or pulmonary symptoms. Patient denies alcohol use, clinical history and the history given by his family at the bedside is suspicious.  ----------------------------------------- 4:38 PM on 11/24/2015 -----------------------------------------  Dr. rather broad evaluation, 500 mL of fluid the patient's blood pressure is improving with systolic in the [id number]. Fully awake and alert. He requested of the patient, and he reports okay to report on test to his family at the bedside. We discussed that an ethanol level today is quite elevated, and also his kidney function is weak and  again. The patient adamantly denies any alcohol use, however the family gives a history that he is in denial about his alcohol use and they suspect he is getting dehydrated and also intoxicated causing falls and occasional alteration in mental status. To my evaluation today, this appears to be most consistent history and we will hydrate  him, admitted to the hospital for syncope, and further evaluation. Patient appears to be stabilizing now, with no evidence of acute injury or hemodynamic instability now.  Clinical Course     ____________________________________________   FINAL CLINICAL IMPRESSION(S) / ED DIAGNOSES  Final diagnoses:  Acute-on-chronic kidney injury ([place])  Dehydration  Syncope and collapse      NEW MEDICATIONS STARTED DURING THIS VISIT:  New Prescriptions   No medications on file     Note:  This document was prepared using Dragon voice recognition software and may include unintentional dictation errors.     Delman Kitten, MD 11/24/15 [phone]

## 2015-11-25 ENCOUNTER — Inpatient Hospital Stay: Payer: Commercial Managed Care - HMO

## 2015-11-25 LAB — BASIC METABOLIC PANEL
Anion gap: 5 (ref 5–15)
BUN: 42 mg/dL — AB (ref 6–20)
CHLORIDE: 110 mmol/L (ref 101–111)
CO2: 25 mmol/L (ref 22–32)
CREATININE: 3.7 mg/dL — AB (ref 0.61–1.24)
Calcium: 8.4 mg/dL — ABNORMAL LOW (ref 8.9–10.3)
GFR calc Af Amer: 16 mL/min — ABNORMAL LOW (ref >60)
GFR calc non Af Amer: 14 mL/min — ABNORMAL LOW (ref >60)
Glucose, Bld: 93 mg/dL (ref 65–99)
POTASSIUM: 3.9 mmol/L (ref 3.5–5.1)
Sodium: 140 mmol/L (ref 135–145)

## 2015-11-25 LAB — CBC
HEMATOCRIT: 33.8 % — AB (ref 40.0–52.0)
Hemoglobin: 11.2 g/dL — ABNORMAL LOW (ref 13.0–18.0)
MCH: 30.2 pg (ref 26.0–34.0)
MCHC: 33.1 g/dL (ref 32.0–36.0)
MCV: 91.4 fL (ref 80.0–100.0)
PLATELETS: 200 10*3/uL (ref 150–440)
RBC: 3.7 MIL/uL — AB (ref 4.40–5.90)
RDW: 17.1 % — ABNORMAL HIGH (ref 11.5–14.5)
WBC: 6.6 10*3/uL (ref 3.8–10.6)

## 2015-11-25 LAB — TROPONIN I: Troponin I: <0.03 ng/mL (ref <0.03)

## 2015-11-25 MED ORDER — LORAZEPAM 1 MG PO TABS: 1.0000 mg | ORAL_TABLET | Freq: Four times a day (QID) | ORAL | Status: DC | PRN

## 2015-11-25 MED ORDER — THIAMINE HCL 100 MG/ML IJ SOLN: 100.0000 mg | Freq: Every day | INTRAMUSCULAR | Status: DC

## 2015-11-25 MED ORDER — LORAZEPAM 2 MG/ML IJ SOLN
0.0000 mg | Freq: Two times a day (BID) | INTRAMUSCULAR | Status: DC
Start: 2015-11-27 — End: 2015-11-26

## 2015-11-25 MED ORDER — ADULT MULTIVITAMIN W/MINERALS CH
1.0000 | ORAL_TABLET | Freq: Every day | ORAL | Status: DC
  Administered 2015-11-25 – 2015-11-26 (×2): 1 via ORAL
  Filled 2015-11-25 (×2): qty 1

## 2015-11-25 MED ORDER — LORAZEPAM 2 MG/ML IJ SOLN: 0.0000 mg | Freq: Four times a day (QID) | INTRAMUSCULAR | Status: DC

## 2015-11-25 MED ORDER — LORAZEPAM 2 MG/ML IJ SOLN: 1.0000 mg | Freq: Four times a day (QID) | INTRAMUSCULAR | Status: DC | PRN

## 2015-11-25 MED ORDER — FOLIC ACID 1 MG PO TABS
1.0000 mg | ORAL_TABLET | Freq: Every day | ORAL | Status: DC
  Administered 2015-11-25 – 2015-11-26 (×2): 1 mg via ORAL
  Filled 2015-11-25 (×2): qty 1

## 2015-11-25 MED ORDER — VITAMIN B-1 100 MG PO TABS
100.0000 mg | ORAL_TABLET | Freq: Every day | ORAL | Status: DC
  Administered 2015-11-25 – 2015-11-26 (×2): 100 mg via ORAL
  Filled 2015-11-25 (×2): qty 1

## 2015-11-25 NOTE — Progress Notes (Signed)
[facility] [place] NAME: [name]    MR#:  [id number]  DATE OF BIRTH:  1934/02/02  SUBJECTIVE:   Patient here due to a syncopal episode and also noted to be hypotensive. Noted to be in acute on chronic renal failure. Also noted to be intoxicated. No evidence of alcohol withdrawal today. Alert and oriented.  REVIEW OF SYSTEMS:    Review of Systems  Constitutional: Negative for chills and fever.  HENT: Negative for congestion and tinnitus.   Eyes: Negative for blurred vision and double vision.  Respiratory: Negative for cough, shortness of breath and wheezing.   Cardiovascular: Negative for chest pain, orthopnea and PND.  Gastrointestinal: Negative for abdominal pain, diarrhea, nausea and vomiting.  Genitourinary: Negative for dysuria and hematuria.  Neurological: Negative for dizziness, sensory change and focal weakness.  All other systems reviewed and are negative.   Nutrition: 2 gm sodium Tolerating Diet: yes Tolerating PT: Await Eval.    DRUG ALLERGIES:   Allergies  Allergen Reactions  . Lipitor [Atorvastatin] Other (See Comments)    Arm pain    VITALS:  Blood pressure (!) 151/81, pulse 80, temperature 98 F (36.7 C), resp. rate 19, height 6' (1.829 m), weight 103.7 kg (228 lb 9.6 oz), SpO2 95 %.  PHYSICAL EXAMINATION:   Physical Exam  GENERAL:  82 y.o.-year-old patient lying in the bed in no acute distress.  EYES: Pupils equal, round, reactive to light and accommodation. No scleral icterus. Extraocular muscles intact.  HEENT: Head atraumatic, normocephalic. Oropharynx and nasopharynx clear.  NECK:  Supple, no jugular venous distention. No thyroid enlargement, no tenderness.  LUNGS: Normal breath sounds bilaterally, no wheezing, rales, rhonchi. No use of accessory muscles of respiration.  CARDIOVASCULAR: S1, S2 normal. No murmurs, rubs, or gallops.  ABDOMEN: Soft, nontender, nondistended. Bowel sounds present. No  organomegaly or mass.  EXTREMITIES: No cyanosis, clubbing or edema b/l.    NEUROLOGIC: Cranial nerves II through XII are intact. No focal Motor or sensory deficits b/l.   PSYCHIATRIC: The patient is alert and oriented x 3.  SKIN: No obvious rash, lesion, or ulcer.    LABORATORY PANEL:   CBC  Recent Labs Lab 11/25/15 0259  WBC 6.6  HGB 11.2*  HCT 33.8*  PLT 200   ------------------------------------------------------------------------------------------------------------------  Chemistries   Recent Labs Lab 11/24/15 1514 11/25/15 0259  NA 139 140  K 3.8 3.9  CL 106 110  CO2 19* 25  GLUCOSE 81 93  BUN 43* 42*  CREATININE 4.17* 3.70*  CALCIUM 8.7* 8.4*  AST 17  --   ALT 11*  --   ALKPHOS 60  --   BILITOT 0.4  --    ------------------------------------------------------------------------------------------------------------------  Cardiac Enzymes  Recent Labs Lab 11/25/15 0955  TROPONINI <0.03   ------------------------------------------------------------------------------------------------------------------  RADIOLOGY:  Ct Head Wo Contrast  Result Date: 11/24/2015 CLINICAL DATA:  Syncope, fall EXAM: CT HEAD WITHOUT CONTRAST CT CERVICAL SPINE WITHOUT CONTRAST TECHNIQUE: Multidetector CT imaging of the head and cervical spine was performed following the standard protocol without intravenous contrast. Multiplanar CT image reconstructions of the cervical spine were also generated. COMPARISON:  CT head 08/07/2015 FINDINGS: CT HEAD FINDINGS Moderate atrophy.  Negative for hydrocephalus. Chronic microvascular ischemic changes in the white matter. Negative for acute infarct. Negative for hemorrhage or mass. No shift of the midline structures Negative for acute skull fracture. Surgical repair of left orbital floor fracture with metal mesh. CT CERVICAL SPINE FINDINGS ACDF C4 through C7 with  hardware.  Solid bony fusion C4-5 and C5-6. Mild anterior listhesis C3-4 with associated  disc and facet degeneration. Mild anterior listhesis C7-T1 with associated facet and disc degeneration. Negative for fracture. Carotid atherosclerotic disease with calcification of a moderate degree. No soft tissue mass lesion. IMPRESSION: Atrophy and chronic microvascular ischemia. No acute intracranial abnormality Cervical fusion C4 through C6. Cervical disc and facet degeneration. Negative for cervical spine fracture. Electronically Signed   By: Franchot Gallo M.D.   On: 11/24/2015 16:16   Ct Cervical Spine Wo Contrast  Result Date: 11/24/2015 CLINICAL DATA:  Syncope, fall EXAM: CT HEAD WITHOUT CONTRAST CT CERVICAL SPINE WITHOUT CONTRAST TECHNIQUE: Multidetector CT imaging of the head and cervical spine was performed following the standard protocol without intravenous contrast. Multiplanar CT image reconstructions of the cervical spine were also generated. COMPARISON:  CT head 08/07/2015 FINDINGS: CT HEAD FINDINGS Moderate atrophy.  Negative for hydrocephalus. Chronic microvascular ischemic changes in the white matter. Negative for acute infarct. Negative for hemorrhage or mass. No shift of the midline structures Negative for acute skull fracture. Surgical repair of left orbital floor fracture with metal mesh. CT CERVICAL SPINE FINDINGS ACDF C4 through C7 with hardware.  Solid bony fusion C4-5 and C5-6. Mild anterior listhesis C3-4 with associated disc and facet degeneration. Mild anterior listhesis C7-T1 with associated facet and disc degeneration. Negative for fracture. Carotid atherosclerotic disease with calcification of a moderate degree. No soft tissue mass lesion. IMPRESSION: Atrophy and chronic microvascular ischemia. No acute intracranial abnormality Cervical fusion C4 through C6. Cervical disc and facet degeneration. Negative for cervical spine fracture. Electronically Signed   By: Franchot Gallo M.D.   On: 11/24/2015 16:16   US Carotid Bilateral  Result Date: 11/25/2015 CLINICAL DATA:  Syncope.  EXAM: BILATERAL CAROTID DUPLEX ULTRASOUND TECHNIQUE: Pearline Cables scale imaging, color Doppler and duplex ultrasound were performed of bilateral carotid and vertebral arteries in the neck. COMPARISON:  CT 11/24/2015. FINDINGS: Criteria: Quantification of carotid stenosis is based on velocity parameters that correlate the residual internal carotid diameter with NASCET-based stenosis levels, using the diameter of the distal internal carotid lumen as the denominator for stenosis measurement. The following velocity measurements were obtained: RIGHT ICA:   101/14 cm/sec CCA:  [id number] cm/sec SYSTOLIC ICA/CCA RATIO:  0.1 DIASTOLIC ICA/CCA RATIO:  0.8 ECA:  98 cm/sec LEFT ICA:  127/14 cm/sec CCA:  [id number] cm/sec SYSTOLIC ICA/CCA RATIO:  1.2 DIASTOLIC ICA/CCA RATIO:  0.1 ECA:  103 cm/sec RIGHT CAROTID ARTERY: Mild right carotid bifurcation atherosclerotic vascular disease . RIGHT VERTEBRAL ARTERY:  Patent with antegrade flow. LEFT CAROTID ARTERY: Mild left carotid bifurcation atherosclerotic vascular disease . LEFT VERTEBRAL ARTERY: Patent antegrade flow . IMPRESSION: 1. Mild bilateral carotid bifurcation atherosclerotic vascular disease. No flow limiting stenosis. 2.  Vertebral arteries are patent with antegrade flow. Electronically Signed   By: Marcello Moores  Register   On: 11/25/2015 12:47   Dg Chest Port 1 View  Result Date: 11/24/2015 CLINICAL DATA:  Fall, syncope EXAM: PORTABLE CHEST 1 VIEW COMPARISON:  08/19/2015 FINDINGS: Postop CABG.  Single lead pacemaker unchanged. Lungs are clear without infiltrate or effusion. Negative for heart failure or effusion. IMPRESSION: No active disease. Electronically Signed   By: Franchot Gallo M.D.   On: 11/24/2015 16:10     ASSESSMENT AND PLAN:   82 year old male with past medical history of tachybradycardia syndrome status post pacemaker, obstructive sleep apnea, peripheral asked disease, hypertension, hyperlipidemia, GERD, chronic kidney disease stage III, COPD, chronic diastolic CHF,  atrial fibrillation who presented to  the hospital due to a syncopal episode and also noted to be in acute on chronic renal failure.  1. Syncope-likely related to hypotension. Patient presented to the hospital started blood pressures in 70s. -Patient has been adequately hydrated and his hypotension has not resolved. CT head negative for acute pathology. No alarms on telemetry, Marcus Sims 3 AB-. -Carotid ultrasound showing no evidence of acute pathology.  2. Acute on chronic renal failure-patient's baseline creatinine is usually around 2.5-3. He presented with a creatinine of as high as 4.1. -Likely dehydration and hypotension related. Continue gentle IV fluids and creatinine is improving. -We'll get nephrology consultation a.m.  3. Alcohol abuse-continue CIWA protocol. High risk for alcohol withdrawal. -Continue thiamine, folate.  4. Dementia-continue Aricept. Next  5. History of atrial fibrillation-status post pacemaker. -Rate controlled. Continue Eliquis  6. BPH-continue Flomax.  7. GERD-continue Protonix.  8. Hyperlipidemia-continue simvastatin.     All the records are reviewed and case discussed with Care Management/Social Worker. Management plans discussed with the patient, family and they are in agreement.  CODE STATUS: Full  DVT Prophylaxis: Eliquis  TOTAL TIME TAKING CARE OF THIS PATIENT: 30 minutes.   POSSIBLE D/C IN 1-2 DAYS, DEPENDING ON CLINICAL CONDITION.   Henreitta Leber M.D on 11/25/2015 at 4:25 PM  Between 7am to 6pm - Pager - [phone]  After 6pm go to [email or web address] - Proofreader  [facility]  Office  [phone]  CC: Primary care physician; [place] Pink, MD

## 2015-11-26 LAB — BASIC METABOLIC PANEL
Anion gap: 7 (ref 5–15)
BUN: 32 mg/dL — ABNORMAL HIGH (ref 6–20)
CO2: 24 mmol/L (ref 22–32)
Calcium: 8.2 mg/dL — ABNORMAL LOW (ref 8.9–10.3)
Chloride: 111 mmol/L (ref 101–111)
Creatinine, Ser: 2.55 mg/dL — ABNORMAL HIGH (ref 0.61–1.24)
GFR calc Af Amer: 26 mL/min — ABNORMAL LOW (ref >60)
GFR calc non Af Amer: 22 mL/min — ABNORMAL LOW (ref >60)
Glucose, Bld: 114 mg/dL — ABNORMAL HIGH (ref 65–99)
Potassium: 3.7 mmol/L (ref 3.5–5.1)
Sodium: 142 mmol/L (ref 135–145)

## 2015-11-26 LAB — GLUCOSE, CAPILLARY: Glucose-Capillary: 108 mg/dL — ABNORMAL HIGH (ref 65–99)

## 2015-11-26 NOTE — Discharge Summary (Signed)
[facility] NAME: [name]    MR#:  [id number]  DATE OF BIRTH:  [birth year]  DATE OF ADMISSION:  11/24/2015 ADMITTING PHYSICIAN: Epifanio Lesches, MD  DATE OF DISCHARGE: 11/26/2015  PRIMARY CARE PHYSICIAN: Maryland Pink, MD    ADMISSION DIAGNOSIS:  Syncope and collapse [R55] Dehydration [E86.0] Syncope [R55] Acute-on-chronic kidney injury ([place]) [N17.9, N18.9]  DISCHARGE DIAGNOSIS:  Active Problems:   Syncope   SECONDARY DIAGNOSIS:   Past Medical History:  Diagnosis Date  . AAA (abdominal aortic aneurysm) ([place])    a. s/p repair - 1999 @ [facility] @ time of CABG.  . Broken ankle   . Cancer ([place])    skin cancer nose  . Chronic atrial fibrillation ([facility])    a. on pradaxa  . Chronic diastolic CHF (congestive heart failure) ([place])    a. 01/2012 Echo:  EF 55%.  . Chronic obstructive pulmonary disease ([place])   . CKD (chronic kidney disease), stage III    a. Acute on chronic 03/2012  . Coronary artery disease    a. s/p MI x 3;  b. s/p CABG x2 in 1999;  c. Cath 2007: LIMA->LAD patent, VG->OM 100, PCI/DES of RPL/mid RCA (cypher DES).  . GERD (gastroesophageal reflux disease)   . Hyperlipidemia   . Hypertension   . PVD (peripheral vascular disease) ([place])   . Sleep apnea   . Tachy-brady syndrome ([place])    a. s/p PPM 2010.    HOSPITAL COURSE:   82 year old male with past medical history of tachybradycardia syndrome status post pacemaker, obstructive sleep apnea, peripheral asked disease, hypertension, hyperlipidemia, GERD, chronic kidney disease stage III, COPD, chronic diastolic CHF, atrial fibrillation who presented to the hospital due to a syncopal episode and also noted to be in acute on chronic renal failure.  1. Syncope- due to hypotension. Patient presented to the hospital started blood pressures in 70s. -Patient has been adequately hydrated and his hypotension has not resolved. CT head negative for acute pathology. No  alarms on telemetry, Cardiac enzymes X 3 have been (-). -Carotid ultrasound showing no evidence of acute pathology.  2. Acute on chronic renal failure-patient's baseline creatinine is usually around 2.5-3. He presented with a creatinine of as high as 4.1. -Likely dehydration and hypotension related. She was given IV fluids and his creatinine is now back to baseline at 2.9. -I'm referring him to nephrology as an outpatient.  3. Alcohol abuse-while in the hospital patient was placed on CIWA protocol. He did not require any medications although. -He currently shows no evidence of alcohol withdrawal.  4. Dementia-continue Aricept.   5. History of atrial fibrillation-status post pacemaker. -he will resume his metoprolol for rate control. He will cont. Eliquis  6. BPH-he will continue Flomax.  7. GERD- he will continue Omeprazole.  8. Hyperlipidemia- he will continue simvastatin  DISCHARGE CONDITIONS:   Stable  CONSULTS OBTAINED:    DRUG ALLERGIES:   Allergies  Allergen Reactions  . Lipitor [Atorvastatin] Other (See Comments)    Arm pain    DISCHARGE MEDICATIONS:     Medication List    TAKE these medications   acetaminophen 325 MG tablet Commonly known as:  TYLENOL Take 650 mg by mouth every 6 (six) hours as needed.   apixaban 2.5 MG Tabs tablet Commonly known as:  ELIQUIS Take 1 tablet (2.5 mg total) by mouth 2 (two) times daily.   diltiazem 120 MG 24 hr capsule Commonly known as:  CARDIZEM CD  Take 1 capsule (120 mg total) by mouth daily. What changed:  when to take this   donepezil 5 MG tablet Commonly known as:  ARICEPT Take 5 mg by mouth at bedtime.   FLOMAX 0.4 MG Caps capsule Generic drug:  tamsulosin Take 0.4 mg by mouth daily after supper.   metoprolol succinate 100 MG 24 hr tablet Commonly known as:  TOPROL-XL Take 1 tablet (100 mg total) by mouth daily. What changed:  when to take this   nitroGLYCERIN 0.4 MG SL tablet Commonly known as:   NITROSTAT Place 1 tablet (0.4 mg total) under the tongue every 5 (five) minutes as needed for chest pain.   omeprazole 20 MG capsule Commonly known as:  PRILOSEC Take 20 mg by mouth daily with lunch.   simvastatin 20 MG tablet Commonly known as:  ZOCOR Take 1 tablet (20 mg total) by mouth at bedtime.   torsemide 20 MG tablet Commonly known as:  DEMADEX Take 1 tablet (20 mg total) by mouth 2 (two) times daily as needed (for weight gain). What changed:  when to take this         DISCHARGE INSTRUCTIONS:   DIET:  Cardiac diet  DISCHARGE CONDITION:  Stable  ACTIVITY:  Activity as tolerated  OXYGEN:  Home Oxygen: No.   Oxygen Delivery: room air  DISCHARGE LOCATION:  Home with Home health nursing/PT   If you experience worsening of your admission symptoms, develop shortness of breath, life threatening emergency, suicidal or homicidal thoughts you must seek medical attention immediately by calling 911 or calling your MD immediately  if symptoms less severe.  You Must read complete instructions/literature along with all the possible adverse reactions/side effects for all the Medicines you take and that have been prescribed to you. Take any new Medicines after you have completely understood and accpet all the possible adverse reactions/side effects.   Please note  You were cared for by a hospitalist during your hospital stay. If you have any questions about your discharge medications or the care you received while you were in the hospital after you are discharged, you can call the unit and asked to speak with the hospitalist on call if the hospitalist that took care of you is not available. Once you are discharged, your primary care physician will handle any further medical issues. Please note that NO REFILLS for any discharge medications will be authorized once you are discharged, as it is imperative that you return to your primary care physician (or establish a relationship with  a primary care physician if you do not have one) for your aftercare needs so that they can reassess your need for medications and monitor your lab values.     Today   No complaints presently.  No events overnight.  Now a bit hypertensive.  Renal function impvoing.    VITAL SIGNS:  Blood pressure (!) 195/92, pulse 80, temperature 98 F (36.7 C), resp. rate (!) 21, height 6' (1.829 m), weight 104 kg (229 lb 4.5 oz), SpO2 97 %.  I/O:   Intake/Output Summary (Last 24 hours) at 11/26/15 1432 Last data filed at 11/26/15 1318  Gross per 24 hour  Intake             2540 ml  Output             1700 ml  Net              840 ml    PHYSICAL EXAMINATION:   GENERAL:  82 y.o.-year-old patient lying in the bed in no acute distress.  EYES: Pupils equal, round, reactive to light and accommodation. No scleral icterus. Extraocular muscles intact.  HEENT: Head atraumatic, normocephalic. Oropharynx and nasopharynx clear.  NECK:  Supple, no jugular venous distention. No thyroid enlargement, no tenderness.  LUNGS: Normal breath sounds bilaterally, no wheezing, rales, rhonchi. No use of accessory muscles of respiration.  CARDIOVASCULAR: S1, S2 normal. No murmurs, rubs, or gallops.  ABDOMEN: Soft, nontender, nondistended. Bowel sounds present. No organomegaly or mass.  EXTREMITIES: No cyanosis, clubbing or edema b/l.    NEUROLOGIC: Cranial nerves II through XII are intact. No focal Motor or sensory deficits b/l.   PSYCHIATRIC: The patient is alert and oriented x 3.  SKIN: No obvious rash, lesion, or ulcer.   DATA REVIEW:   CBC  Recent Labs Lab 11/25/15 0259  WBC 6.6  HGB 11.2*  HCT 33.8*  PLT 200    Chemistries   Recent Labs Lab 11/24/15 1514  11/26/15 0349  NA 139  < > 142  K 3.8  < > 3.7  CL 106  < > 111  CO2 19*  < > 24  GLUCOSE 81  < > 114*  BUN 43*  < > 32*  CREATININE 4.17*  < > 2.55*  CALCIUM 8.7*  < > 8.2*  AST 17  --   --   ALT 11*  --   --   ALKPHOS 60  --   --    BILITOT 0.4  --   --   < > = values in this interval not displayed.  Cardiac Enzymes  Recent Labs Lab 11/25/15 0955  TROPONINI <0.03    Microbiology Results  Results for orders placed or performed during the hospital encounter of 01/07/15  Surgical pcr screen     Status: None   Collection Time: 01/07/15  6:15 PM  Result Value Ref Range Status   MRSA, PCR NEGATIVE NEGATIVE Final   Staphylococcus aureus NEGATIVE NEGATIVE Final    Comment:        The Xpert SA Assay (FDA approved for NASAL specimens in patients over 45 years of age), is one component of a comprehensive surveillance program.  Test performance has been validated by [facility] for patients greater than or equal to 2 year old. It is not intended to diagnose infection nor to guide or monitor treatment.     RADIOLOGY:  Ct Head Wo Contrast  Result Date: 11/24/2015 CLINICAL DATA:  Syncope, fall EXAM: CT HEAD WITHOUT CONTRAST CT CERVICAL SPINE WITHOUT CONTRAST TECHNIQUE: Multidetector CT imaging of the head and cervical spine was performed following the standard protocol without intravenous contrast. Multiplanar CT image reconstructions of the cervical spine were also generated. COMPARISON:  CT head 08/07/2015 FINDINGS: CT HEAD FINDINGS Moderate atrophy.  Negative for hydrocephalus. Chronic microvascular ischemic changes in the white matter. Negative for acute infarct. Negative for hemorrhage or mass. No shift of the midline structures Negative for acute skull fracture. Surgical repair of left orbital floor fracture with metal mesh. CT CERVICAL SPINE FINDINGS ACDF C4 through C7 with hardware.  Solid bony fusion C4-5 and C5-6. Mild anterior listhesis C3-4 with associated disc and facet degeneration. Mild anterior listhesis C7-T1 with associated facet and disc degeneration. Negative for fracture. Carotid atherosclerotic disease with calcification of a moderate degree. No soft tissue mass lesion. IMPRESSION: Atrophy and  chronic microvascular ischemia. No acute intracranial abnormality Cervical fusion C4 through C6. Cervical disc and facet degeneration. Negative for cervical  spine fracture. Electronically Signed   By: Franchot Gallo M.D.   On: 11/24/2015 16:16   Ct Cervical Spine Wo Contrast  Result Date: 11/24/2015 CLINICAL DATA:  Syncope, fall EXAM: CT HEAD WITHOUT CONTRAST CT CERVICAL SPINE WITHOUT CONTRAST TECHNIQUE: Multidetector CT imaging of the head and cervical spine was performed following the standard protocol without intravenous contrast. Multiplanar CT image reconstructions of the cervical spine were also generated. COMPARISON:  CT head 08/07/2015 FINDINGS: CT HEAD FINDINGS Moderate atrophy.  Negative for hydrocephalus. Chronic microvascular ischemic changes in the white matter. Negative for acute infarct. Negative for hemorrhage or mass. No shift of the midline structures Negative for acute skull fracture. Surgical repair of left orbital floor fracture with metal mesh. CT CERVICAL SPINE FINDINGS ACDF C4 through C7 with hardware.  Solid bony fusion C4-5 and C5-6. Mild anterior listhesis C3-4 with associated disc and facet degeneration. Mild anterior listhesis C7-T1 with associated facet and disc degeneration. Negative for fracture. Carotid atherosclerotic disease with calcification of a moderate degree. No soft tissue mass lesion. IMPRESSION: Atrophy and chronic microvascular ischemia. No acute intracranial abnormality Cervical fusion C4 through C6. Cervical disc and facet degeneration. Negative for cervical spine fracture. Electronically Signed   By: Franchot Gallo M.D.   On: 11/24/2015 16:16   US Carotid Bilateral  Result Date: 11/25/2015 CLINICAL DATA:  Syncope. EXAM: BILATERAL CAROTID DUPLEX ULTRASOUND TECHNIQUE: Pearline Cables scale imaging, color Doppler and duplex ultrasound were performed of bilateral carotid and vertebral arteries in the neck. COMPARISON:  CT 11/24/2015. FINDINGS: Criteria: Quantification of  carotid stenosis is based on velocity parameters that correlate the residual internal carotid diameter with NASCET-based stenosis levels, using the diameter of the distal internal carotid lumen as the denominator for stenosis measurement. The following velocity measurements were obtained: RIGHT ICA:   101/14 cm/sec CCA:  [id number] cm/sec SYSTOLIC ICA/CCA RATIO:  0.1 DIASTOLIC ICA/CCA RATIO:  0.8 ECA:  98 cm/sec LEFT ICA:  127/14 cm/sec CCA:  [id number] cm/sec SYSTOLIC ICA/CCA RATIO:  1.2 DIASTOLIC ICA/CCA RATIO:  0.1 ECA:  103 cm/sec RIGHT CAROTID ARTERY: Mild right carotid bifurcation atherosclerotic vascular disease . RIGHT VERTEBRAL ARTERY:  Patent with antegrade flow. LEFT CAROTID ARTERY: Mild left carotid bifurcation atherosclerotic vascular disease . LEFT VERTEBRAL ARTERY: Patent antegrade flow . IMPRESSION: 1. Mild bilateral carotid bifurcation atherosclerotic vascular disease. No flow limiting stenosis. 2.  Vertebral arteries are patent with antegrade flow. Electronically Signed   By: Marcello Moores  Register   On: 11/25/2015 12:47   Dg Chest Port 1 View  Result Date: 11/24/2015 CLINICAL DATA:  Fall, syncope EXAM: PORTABLE CHEST 1 VIEW COMPARISON:  08/19/2015 FINDINGS: Postop CABG.  Single lead pacemaker unchanged. Lungs are clear without infiltrate or effusion. Negative for heart failure or effusion. IMPRESSION: No active disease. Electronically Signed   By: Franchot Gallo M.D.   On: 11/24/2015 16:10      Management plans discussed with the patient, family and they are in agreement.  CODE STATUS:     Code Status Orders        Start     Ordered   11/24/15 1726  Full code  Continuous     11/24/15 1727    Code Status History    Date Active Date Inactive Code Status Order ID Comments User Context   01/08/2015 11:16 PM 01/10/2015  8:35 PM Full Code [id number]  Earnestine Leys, MD Inpatient   01/07/2015  3:06 PM 01/08/2015 11:16 PM Full Code [id number]  Earnestine Leys, MD ED   08/22/2014  8:47  PM 08/25/2014  5:54  PM Full Code [id number]  Dustin Flock, MD ED    Advance Directive Documentation   [place] Most Recent Value  Type of Advance Directive  Living will  Pre-existing out of facility DNR order (yellow form or pink MOST form)  No data  "MOST" Form in Place?  No data      TOTAL TIME TAKING CARE OF THIS PATIENT: 40 minutes.    Henreitta Leber M.D on 11/26/2015 at 2:32 PM  Between 7am to 6pm - Pager - [phone]  After 6pm go to [email or web address] - Proofreader  [facility]  Office  [phone]  CC: Primary care physician; Maryland Pink, MD

## 2015-11-26 NOTE — Care Management Note (Signed)
Case Management Note  Patient Details  Name: COLORADO CUVELIER MRN: [id number] Date of Birth: 10/23/1933  Subjective/Objective:  Spoke with patient regarding discharge planning. He lives at home with his wife. He uses a cane but has a walker. Recent fall.  Patient agrees with home health. Thinks he has used [facility] before. Prefers them again. [facility]erral to [facility] for [facility] and PT. PCP is Dr. Kary Kos. Family assists with transporation.                 Action/Plan:   Expected Discharge Date:                  Expected Discharge Plan:  [place]  In-House Referral:     Discharge planning Services  CM Consult  Post Acute Care Choice:  Home Health Choice offered to:  Patient  DME Arranged:    DME Agency:     HH Arranged:  RN, PT [place] Agency:  [facility]  Status of Service:  In process, will continue to follow  If discussed at Long Length of Stay Meetings, dates discussed:    Additional Comments:  Jolly Mango, RN 11/26/2015, 3:02 PM

## 2015-11-26 NOTE — Evaluation (Signed)
Physical Therapy Evaluation Patient Details Name: [name] MRN: [id number] DOB: March 03, 1934 Today's Date: 11/26/2015   History of Present Illness  Pt is a 82 yr old male presenting following a syncopal episode resulting in a fall. +Etoh and BP noted to be 70/40 by EMS. PMH significant for HTN, AFib, CHF, AAA with repair, skin CA, COPD, CAD x/p CABG, tachybradycardia, C4-6 fusion, pacemaker, and h/o bilat ankle surgery.  Clinical Impression  Prior to admission, pt was mod independent with a single point cane for household and limited community mobility.  Pt lives with his spouse in a one-level home with ramped entry.  Currently, pt is mod I for bed mobility, supervision for transfers, and min guard for ambulation x90ft with single point cane.  Distance limited by fatigue/SOB, though SaO2 > 97%. Pt's BP responded to activity as following: 167/77 supine, 168/96 standing, 210/89 following 21ft ambulation, 195/93 following 3 mins seated rest.  RN notified of all BP readings.  Pt presents with generalized weakness, instability, and decreased activity tolerance limiting his functional mobility.  Pt would benefit from skilled PT to address noted impairments and functional limitations.  Recommend pt discharge home with intermittent supervision and HHPT when medically appropriate.     Follow Up Recommendations Home health PT; intermittent supervision    Equipment Recommendations  Kasandra Knudsen (pt already owns)    Recommendations for Other Services       Precautions / Restrictions Precautions Precautions: Fall;ICD/Pacemaker Restrictions Weight Bearing Restrictions: No      Mobility  Bed Mobility Overal bed mobility: Modified Independent General bed mobility comments: Pt able to achieve supine > sit with HOB elevated ~20 degrees and use of bed rails.  Transfers Overall transfer level: Needs assistance Equipment used: Straight cane Transfers: Sit to/from Stand (x 2 trials) Sit to Stand:  Supervision General transfer comment: Sit <> stand x 2 from EOB using single point cane. Increased time and effort required, but no physical assist.  Ambulation/Gait Ambulation/Gait assistance: Min guard Ambulation Distance (Feet): 50 Feet Assistive device: Straight cane Gait Pattern/deviations: Step-to pattern;Decreased stride length Gait velocity: Decreased General Gait Details: Mild instability with single point cane but requires no physical assist for steadying. Safe use of DME. Distance limited by cardiovascular endurance; pt audibly SOB, states "I just get so easily winded". SaO2 > 96%  Stairs       Balance Overall balance assessment: Needs assistance Sitting-balance support: Feet supported Sitting balance-Leahy Scale: Good Standing balance support: Single extremity supported (on single point cane) Standing balance-Leahy Scale: Fair      Pertinent Vitals/Pain Pain Assessment: 0-10 Pain Score: 3  Pain Location: chronic LBP  SaO2 > 96% HR WFL throughout session    [place] expects to be discharged to:: Private residence Living Arrangements: Spouse/significant other Available Help at Discharge: Family;Available 24 hours/day Type of Home: House Home Access: Ramped entrance Home Layout: One level Home Equipment: Walker - 2 wheels;Cane - single point;Shower seat   Prior Function Level of Independence: Independent with assistive device(s)  Comments: Pt mod I with single point cane.        Extremity/Trunk Assessment   Upper Extremity Assessment: Generalized weakness  Lower Extremity Assessment: Generalized weakness  Cervical / Trunk Assessment: Normal    Communication   Communication: No difficulties  Cognition Arousal/Alertness: Awake/alert Behavior During Therapy: WFL for tasks assessed/performed Overall Cognitive Status: Within Functional Limits for tasks assessed    General Comments Nursing cleared pt for participation in physical  therapy.  Pt agreeable to  PT session.     Exercises General Exercises - Lower Extremity Ankle Circles/Pumps: AROM Quad Sets: AROM Short Arc Quad: AROM Heel Slides: AROM Hip ABduction/ADduction: AROM Straight Leg Raises: AROM  All exercises performed bilaterally x 10 reps in supine.       Assessment/Plan    PT Assessment Patient needs continued PT services  PT Diagnosis Difficulty walking;Generalized weakness   PT Problem List Decreased strength;Decreased activity tolerance;Decreased balance;Decreased mobility;Cardiopulmonary status limiting activity  PT Treatment Interventions Gait training;Functional mobility training;Therapeutic activities;Therapeutic exercise;Balance training;Patient/family education   PT Goals (Current goals can be found in the Care Plan section) Acute Rehab PT Goals Patient Stated Goal: To go home PT Goal Formulation: With patient Time For Goal Achievement: 12/10/15 Potential to Achieve Goals: Good    Frequency Min 2X/week   Barriers to discharge           End of Session Equipment Utilized During Treatment: Gait belt Activity Tolerance: Patient tolerated treatment well;Patient limited by fatigue Patient left: in chair;with call bell/phone within reach;with chair alarm set;with family/visitor present Nurse Communication: Mobility status;Precautions         Time: [id number] PT Time Calculation (min) (ACUTE ONLY): 37 min   Charges:         PT G Codes:        Aya Geisel, SPT 11/26/2015, 1:25 PM

## 2015-11-26 NOTE — Plan of Care (Signed)
Problem: Health Behavior/Discharge Planning: Goal: Ability to manage health-related needs will improve Outcome: Not Progressing Patient is in denial about effects of alcohol abuse.

## 2015-11-28 DIAGNOSIS — I5032 Chronic diastolic (congestive) heart failure: Secondary | ICD-10-CM | POA: Diagnosis not present

## 2015-11-28 DIAGNOSIS — I482 Chronic atrial fibrillation: Secondary | ICD-10-CM | POA: Diagnosis not present

## 2015-11-28 DIAGNOSIS — N183 Chronic kidney disease, stage 3 (moderate): Secondary | ICD-10-CM | POA: Diagnosis not present

## 2015-11-28 DIAGNOSIS — I130 Hypertensive heart and chronic kidney disease with heart failure and stage 1 through stage 4 chronic kidney disease, or unspecified chronic kidney disease: Secondary | ICD-10-CM | POA: Diagnosis not present

## 2015-11-28 DIAGNOSIS — F1010 Alcohol abuse, uncomplicated: Secondary | ICD-10-CM | POA: Diagnosis not present

## 2015-11-28 DIAGNOSIS — F0390 Unspecified dementia without behavioral disturbance: Secondary | ICD-10-CM | POA: Diagnosis not present

## 2015-11-28 DIAGNOSIS — I2510 Atherosclerotic heart disease of native coronary artery without angina pectoris: Secondary | ICD-10-CM | POA: Diagnosis not present

## 2015-11-28 DIAGNOSIS — J449 Chronic obstructive pulmonary disease, unspecified: Secondary | ICD-10-CM | POA: Diagnosis not present

## 2015-11-28 DIAGNOSIS — I959 Hypotension, unspecified: Secondary | ICD-10-CM | POA: Diagnosis not present

## 2015-11-30 DIAGNOSIS — F1010 Alcohol abuse, uncomplicated: Secondary | ICD-10-CM | POA: Diagnosis not present

## 2015-11-30 DIAGNOSIS — I130 Hypertensive heart and chronic kidney disease with heart failure and stage 1 through stage 4 chronic kidney disease, or unspecified chronic kidney disease: Secondary | ICD-10-CM | POA: Diagnosis not present

## 2015-11-30 DIAGNOSIS — I959 Hypotension, unspecified: Secondary | ICD-10-CM | POA: Diagnosis not present

## 2015-11-30 DIAGNOSIS — N183 Chronic kidney disease, stage 3 (moderate): Secondary | ICD-10-CM | POA: Diagnosis not present

## 2015-11-30 DIAGNOSIS — F0390 Unspecified dementia without behavioral disturbance: Secondary | ICD-10-CM | POA: Diagnosis not present

## 2015-11-30 DIAGNOSIS — I5032 Chronic diastolic (congestive) heart failure: Secondary | ICD-10-CM | POA: Diagnosis not present

## 2015-11-30 DIAGNOSIS — I2510 Atherosclerotic heart disease of native coronary artery without angina pectoris: Secondary | ICD-10-CM | POA: Diagnosis not present

## 2015-11-30 DIAGNOSIS — I482 Chronic atrial fibrillation: Secondary | ICD-10-CM | POA: Diagnosis not present

## 2015-11-30 DIAGNOSIS — J449 Chronic obstructive pulmonary disease, unspecified: Secondary | ICD-10-CM | POA: Diagnosis not present

## 2015-12-01 DIAGNOSIS — J449 Chronic obstructive pulmonary disease, unspecified: Secondary | ICD-10-CM | POA: Diagnosis not present

## 2015-12-01 DIAGNOSIS — I482 Chronic atrial fibrillation: Secondary | ICD-10-CM | POA: Diagnosis not present

## 2015-12-01 DIAGNOSIS — I5032 Chronic diastolic (congestive) heart failure: Secondary | ICD-10-CM | POA: Diagnosis not present

## 2015-12-01 DIAGNOSIS — I130 Hypertensive heart and chronic kidney disease with heart failure and stage 1 through stage 4 chronic kidney disease, or unspecified chronic kidney disease: Secondary | ICD-10-CM | POA: Diagnosis not present

## 2015-12-01 DIAGNOSIS — F0390 Unspecified dementia without behavioral disturbance: Secondary | ICD-10-CM | POA: Diagnosis not present

## 2015-12-01 DIAGNOSIS — I2510 Atherosclerotic heart disease of native coronary artery without angina pectoris: Secondary | ICD-10-CM | POA: Diagnosis not present

## 2015-12-01 DIAGNOSIS — I959 Hypotension, unspecified: Secondary | ICD-10-CM | POA: Diagnosis not present

## 2015-12-01 DIAGNOSIS — N183 Chronic kidney disease, stage 3 (moderate): Secondary | ICD-10-CM | POA: Diagnosis not present

## 2015-12-01 DIAGNOSIS — F1010 Alcohol abuse, uncomplicated: Secondary | ICD-10-CM | POA: Diagnosis not present

## 2015-12-02 ENCOUNTER — Other Ambulatory Visit: Payer: Self-pay

## 2015-12-02 MED ORDER — APIXABAN 2.5 MG PO TABS: 2.5000 mg | ORAL_TABLET | Freq: Two times a day (BID) | ORAL | 6 refills | Status: DC

## 2015-12-04 DIAGNOSIS — I5032 Chronic diastolic (congestive) heart failure: Secondary | ICD-10-CM | POA: Diagnosis not present

## 2015-12-04 DIAGNOSIS — F1010 Alcohol abuse, uncomplicated: Secondary | ICD-10-CM | POA: Diagnosis not present

## 2015-12-04 DIAGNOSIS — F0390 Unspecified dementia without behavioral disturbance: Secondary | ICD-10-CM | POA: Diagnosis not present

## 2015-12-04 DIAGNOSIS — I482 Chronic atrial fibrillation: Secondary | ICD-10-CM | POA: Diagnosis not present

## 2015-12-04 DIAGNOSIS — N183 Chronic kidney disease, stage 3 (moderate): Secondary | ICD-10-CM | POA: Diagnosis not present

## 2015-12-04 DIAGNOSIS — J449 Chronic obstructive pulmonary disease, unspecified: Secondary | ICD-10-CM | POA: Diagnosis not present

## 2015-12-04 DIAGNOSIS — I130 Hypertensive heart and chronic kidney disease with heart failure and stage 1 through stage 4 chronic kidney disease, or unspecified chronic kidney disease: Secondary | ICD-10-CM | POA: Diagnosis not present

## 2015-12-04 DIAGNOSIS — I2510 Atherosclerotic heart disease of native coronary artery without angina pectoris: Secondary | ICD-10-CM | POA: Diagnosis not present

## 2015-12-04 DIAGNOSIS — I959 Hypotension, unspecified: Secondary | ICD-10-CM | POA: Diagnosis not present

## 2015-12-07 ENCOUNTER — Ambulatory Visit (INDEPENDENT_AMBULATORY_CARE_PROVIDER_SITE_OTHER): Payer: Commercial Managed Care - HMO | Admitting: *Deleted

## 2015-12-07 DIAGNOSIS — Z950 Presence of cardiac pacemaker: Secondary | ICD-10-CM

## 2015-12-08 DIAGNOSIS — I2510 Atherosclerotic heart disease of native coronary artery without angina pectoris: Secondary | ICD-10-CM | POA: Diagnosis not present

## 2015-12-08 DIAGNOSIS — F1010 Alcohol abuse, uncomplicated: Secondary | ICD-10-CM | POA: Diagnosis not present

## 2015-12-08 DIAGNOSIS — I5032 Chronic diastolic (congestive) heart failure: Secondary | ICD-10-CM | POA: Diagnosis not present

## 2015-12-08 DIAGNOSIS — I959 Hypotension, unspecified: Secondary | ICD-10-CM | POA: Diagnosis not present

## 2015-12-08 DIAGNOSIS — I130 Hypertensive heart and chronic kidney disease with heart failure and stage 1 through stage 4 chronic kidney disease, or unspecified chronic kidney disease: Secondary | ICD-10-CM | POA: Diagnosis not present

## 2015-12-08 DIAGNOSIS — I482 Chronic atrial fibrillation: Secondary | ICD-10-CM | POA: Diagnosis not present

## 2015-12-08 DIAGNOSIS — J449 Chronic obstructive pulmonary disease, unspecified: Secondary | ICD-10-CM | POA: Diagnosis not present

## 2015-12-08 DIAGNOSIS — F0390 Unspecified dementia without behavioral disturbance: Secondary | ICD-10-CM | POA: Diagnosis not present

## 2015-12-08 DIAGNOSIS — N183 Chronic kidney disease, stage 3 (moderate): Secondary | ICD-10-CM | POA: Diagnosis not present

## 2015-12-08 NOTE — Progress Notes (Signed)
Remote pacemaker transmission.   

## 2015-12-10 ENCOUNTER — Encounter: Payer: Self-pay | Admitting: Cardiology

## 2015-12-10 DIAGNOSIS — F329 Major depressive disorder, single episode, unspecified: Secondary | ICD-10-CM | POA: Diagnosis not present

## 2015-12-10 DIAGNOSIS — N184 Chronic kidney disease, stage 4 (severe): Secondary | ICD-10-CM | POA: Diagnosis not present

## 2015-12-10 DIAGNOSIS — R350 Frequency of micturition: Secondary | ICD-10-CM | POA: Diagnosis not present

## 2015-12-10 DIAGNOSIS — F1010 Alcohol abuse, uncomplicated: Secondary | ICD-10-CM | POA: Diagnosis not present

## 2015-12-11 DIAGNOSIS — I482 Chronic atrial fibrillation: Secondary | ICD-10-CM | POA: Diagnosis not present

## 2015-12-11 DIAGNOSIS — F0390 Unspecified dementia without behavioral disturbance: Secondary | ICD-10-CM | POA: Diagnosis not present

## 2015-12-11 DIAGNOSIS — I5032 Chronic diastolic (congestive) heart failure: Secondary | ICD-10-CM | POA: Diagnosis not present

## 2015-12-11 DIAGNOSIS — I130 Hypertensive heart and chronic kidney disease with heart failure and stage 1 through stage 4 chronic kidney disease, or unspecified chronic kidney disease: Secondary | ICD-10-CM | POA: Diagnosis not present

## 2015-12-11 DIAGNOSIS — N183 Chronic kidney disease, stage 3 (moderate): Secondary | ICD-10-CM | POA: Diagnosis not present

## 2015-12-11 DIAGNOSIS — F1010 Alcohol abuse, uncomplicated: Secondary | ICD-10-CM | POA: Diagnosis not present

## 2015-12-11 DIAGNOSIS — I2510 Atherosclerotic heart disease of native coronary artery without angina pectoris: Secondary | ICD-10-CM | POA: Diagnosis not present

## 2015-12-11 DIAGNOSIS — N390 Urinary tract infection, site not specified: Secondary | ICD-10-CM | POA: Diagnosis not present

## 2015-12-11 DIAGNOSIS — J449 Chronic obstructive pulmonary disease, unspecified: Secondary | ICD-10-CM | POA: Diagnosis not present

## 2015-12-11 DIAGNOSIS — I959 Hypotension, unspecified: Secondary | ICD-10-CM | POA: Diagnosis not present

## 2015-12-17 DIAGNOSIS — I959 Hypotension, unspecified: Secondary | ICD-10-CM | POA: Diagnosis not present

## 2015-12-17 DIAGNOSIS — I482 Chronic atrial fibrillation: Secondary | ICD-10-CM | POA: Diagnosis not present

## 2015-12-17 DIAGNOSIS — I2510 Atherosclerotic heart disease of native coronary artery without angina pectoris: Secondary | ICD-10-CM | POA: Diagnosis not present

## 2015-12-17 DIAGNOSIS — I130 Hypertensive heart and chronic kidney disease with heart failure and stage 1 through stage 4 chronic kidney disease, or unspecified chronic kidney disease: Secondary | ICD-10-CM | POA: Diagnosis not present

## 2015-12-17 DIAGNOSIS — I5032 Chronic diastolic (congestive) heart failure: Secondary | ICD-10-CM | POA: Diagnosis not present

## 2015-12-17 DIAGNOSIS — J449 Chronic obstructive pulmonary disease, unspecified: Secondary | ICD-10-CM | POA: Diagnosis not present

## 2015-12-17 DIAGNOSIS — N183 Chronic kidney disease, stage 3 (moderate): Secondary | ICD-10-CM | POA: Diagnosis not present

## 2015-12-17 DIAGNOSIS — F1010 Alcohol abuse, uncomplicated: Secondary | ICD-10-CM | POA: Diagnosis not present

## 2015-12-17 DIAGNOSIS — F0390 Unspecified dementia without behavioral disturbance: Secondary | ICD-10-CM | POA: Diagnosis not present

## 2015-12-17 LAB — CUP PACEART REMOTE DEVICE CHECK
Battery Impedance: 4347 Ohm
Brady Statistic RV Percent Paced: 76.6 %
Implantable Lead Location: 753860
Lead Channel Impedance Value: 488 Ohm
Lead Channel Setting Pacing Amplitude: 2.5 V
Lead Channel Setting Pacing Pulse Width: 0.4 ms
Lead Channel Setting Sensing Sensitivity: 5.6 mV
MDC IDC LEAD IMPLANT DT: 20100205
MDC IDC MSMT BATTERY REMAINING LONGEVITY: 11 mo
MDC IDC MSMT BATTERY VOLTAGE: 2.69 V
MDC IDC MSMT LEADCHNL RV PACING THRESHOLD AMPLITUDE: 1.125 V
MDC IDC MSMT LEADCHNL RV PACING THRESHOLD PULSEWIDTH: 0.4 ms
MDC IDC MSMT LEADCHNL RV SENSING INTR AMPL: 11.2 mV
MDC IDC SESS DTM: 20170907123426

## 2015-12-22 DIAGNOSIS — J449 Chronic obstructive pulmonary disease, unspecified: Secondary | ICD-10-CM | POA: Diagnosis not present

## 2015-12-22 DIAGNOSIS — I2510 Atherosclerotic heart disease of native coronary artery without angina pectoris: Secondary | ICD-10-CM | POA: Diagnosis not present

## 2015-12-22 DIAGNOSIS — F0390 Unspecified dementia without behavioral disturbance: Secondary | ICD-10-CM | POA: Diagnosis not present

## 2015-12-22 DIAGNOSIS — I5032 Chronic diastolic (congestive) heart failure: Secondary | ICD-10-CM | POA: Diagnosis not present

## 2015-12-22 DIAGNOSIS — N183 Chronic kidney disease, stage 3 (moderate): Secondary | ICD-10-CM | POA: Diagnosis not present

## 2015-12-22 DIAGNOSIS — I959 Hypotension, unspecified: Secondary | ICD-10-CM | POA: Diagnosis not present

## 2015-12-22 DIAGNOSIS — I130 Hypertensive heart and chronic kidney disease with heart failure and stage 1 through stage 4 chronic kidney disease, or unspecified chronic kidney disease: Secondary | ICD-10-CM | POA: Diagnosis not present

## 2015-12-22 DIAGNOSIS — F1010 Alcohol abuse, uncomplicated: Secondary | ICD-10-CM | POA: Diagnosis not present

## 2015-12-22 DIAGNOSIS — I482 Chronic atrial fibrillation: Secondary | ICD-10-CM | POA: Diagnosis not present

## 2015-12-24 DIAGNOSIS — J449 Chronic obstructive pulmonary disease, unspecified: Secondary | ICD-10-CM | POA: Diagnosis not present

## 2015-12-24 DIAGNOSIS — I959 Hypotension, unspecified: Secondary | ICD-10-CM | POA: Diagnosis not present

## 2015-12-24 DIAGNOSIS — F1010 Alcohol abuse, uncomplicated: Secondary | ICD-10-CM | POA: Diagnosis not present

## 2015-12-24 DIAGNOSIS — I130 Hypertensive heart and chronic kidney disease with heart failure and stage 1 through stage 4 chronic kidney disease, or unspecified chronic kidney disease: Secondary | ICD-10-CM | POA: Diagnosis not present

## 2015-12-24 DIAGNOSIS — I2510 Atherosclerotic heart disease of native coronary artery without angina pectoris: Secondary | ICD-10-CM | POA: Diagnosis not present

## 2015-12-24 DIAGNOSIS — I5032 Chronic diastolic (congestive) heart failure: Secondary | ICD-10-CM | POA: Diagnosis not present

## 2015-12-24 DIAGNOSIS — N183 Chronic kidney disease, stage 3 (moderate): Secondary | ICD-10-CM | POA: Diagnosis not present

## 2015-12-24 DIAGNOSIS — F0390 Unspecified dementia without behavioral disturbance: Secondary | ICD-10-CM | POA: Diagnosis not present

## 2015-12-24 DIAGNOSIS — I482 Chronic atrial fibrillation: Secondary | ICD-10-CM | POA: Diagnosis not present

## 2016-01-04 DIAGNOSIS — J449 Chronic obstructive pulmonary disease, unspecified: Secondary | ICD-10-CM | POA: Diagnosis not present

## 2016-01-04 DIAGNOSIS — F1010 Alcohol abuse, uncomplicated: Secondary | ICD-10-CM | POA: Diagnosis not present

## 2016-01-04 DIAGNOSIS — F0390 Unspecified dementia without behavioral disturbance: Secondary | ICD-10-CM | POA: Diagnosis not present

## 2016-01-04 DIAGNOSIS — I5032 Chronic diastolic (congestive) heart failure: Secondary | ICD-10-CM | POA: Diagnosis not present

## 2016-01-04 DIAGNOSIS — I2510 Atherosclerotic heart disease of native coronary artery without angina pectoris: Secondary | ICD-10-CM | POA: Diagnosis not present

## 2016-01-04 DIAGNOSIS — I130 Hypertensive heart and chronic kidney disease with heart failure and stage 1 through stage 4 chronic kidney disease, or unspecified chronic kidney disease: Secondary | ICD-10-CM | POA: Diagnosis not present

## 2016-01-04 DIAGNOSIS — N183 Chronic kidney disease, stage 3 (moderate): Secondary | ICD-10-CM | POA: Diagnosis not present

## 2016-01-04 DIAGNOSIS — I959 Hypotension, unspecified: Secondary | ICD-10-CM | POA: Diagnosis not present

## 2016-01-04 DIAGNOSIS — I482 Chronic atrial fibrillation: Secondary | ICD-10-CM | POA: Diagnosis not present

## 2016-01-08 ENCOUNTER — Ambulatory Visit (INDEPENDENT_AMBULATORY_CARE_PROVIDER_SITE_OTHER): Payer: Commercial Managed Care - HMO | Admitting: *Deleted

## 2016-01-08 DIAGNOSIS — I482 Chronic atrial fibrillation, unspecified: Secondary | ICD-10-CM

## 2016-01-08 NOTE — Progress Notes (Signed)
Remote pacemaker transmission.   

## 2016-01-13 ENCOUNTER — Encounter: Payer: Self-pay | Admitting: Cardiology

## 2016-01-22 DIAGNOSIS — I130 Hypertensive heart and chronic kidney disease with heart failure and stage 1 through stage 4 chronic kidney disease, or unspecified chronic kidney disease: Secondary | ICD-10-CM | POA: Diagnosis not present

## 2016-01-22 DIAGNOSIS — I5032 Chronic diastolic (congestive) heart failure: Secondary | ICD-10-CM | POA: Diagnosis not present

## 2016-01-22 DIAGNOSIS — F0390 Unspecified dementia without behavioral disturbance: Secondary | ICD-10-CM | POA: Diagnosis not present

## 2016-01-22 DIAGNOSIS — J449 Chronic obstructive pulmonary disease, unspecified: Secondary | ICD-10-CM | POA: Diagnosis not present

## 2016-01-22 DIAGNOSIS — I959 Hypotension, unspecified: Secondary | ICD-10-CM | POA: Diagnosis not present

## 2016-01-22 DIAGNOSIS — I2510 Atherosclerotic heart disease of native coronary artery without angina pectoris: Secondary | ICD-10-CM | POA: Diagnosis not present

## 2016-01-22 DIAGNOSIS — F1010 Alcohol abuse, uncomplicated: Secondary | ICD-10-CM | POA: Diagnosis not present

## 2016-01-22 DIAGNOSIS — I482 Chronic atrial fibrillation: Secondary | ICD-10-CM | POA: Diagnosis not present

## 2016-01-22 DIAGNOSIS — N183 Chronic kidney disease, stage 3 (moderate): Secondary | ICD-10-CM | POA: Diagnosis not present

## 2016-02-03 LAB — CUP PACEART REMOTE DEVICE CHECK
Battery Remaining Longevity: 11 mo
Battery Voltage: 2.69 V
Brady Statistic RV Percent Paced: 82.4 %
Date Time Interrogation Session: 20171025125343
Implantable Lead Location: 753860
Implantable Lead Model: 5076
Lead Channel Setting Sensing Sensitivity: 5.6 mV
MDC IDC LEAD IMPLANT DT: 20100205
MDC IDC MSMT BATTERY IMPEDANCE: 4578 Ohm
MDC IDC MSMT LEADCHNL RV IMPEDANCE VALUE: 505 Ohm
MDC IDC MSMT LEADCHNL RV PACING THRESHOLD AMPLITUDE: 0.75 V
MDC IDC MSMT LEADCHNL RV PACING THRESHOLD PULSEWIDTH: 0.4 ms
MDC IDC SET LEADCHNL RV PACING AMPLITUDE: 2.5 V
MDC IDC SET LEADCHNL RV PACING PULSEWIDTH: 0.4 ms

## 2016-02-08 ENCOUNTER — Ambulatory Visit (INDEPENDENT_AMBULATORY_CARE_PROVIDER_SITE_OTHER): Payer: Commercial Managed Care - HMO | Admitting: *Deleted

## 2016-02-08 DIAGNOSIS — Z950 Presence of cardiac pacemaker: Secondary | ICD-10-CM

## 2016-02-09 NOTE — Progress Notes (Signed)
Remote pacemaker transmission.   

## 2016-02-12 ENCOUNTER — Encounter: Payer: Self-pay | Admitting: Cardiology

## 2016-03-07 LAB — CUP PACEART REMOTE DEVICE CHECK
Battery Impedance: 4508 Ohm
Battery Remaining Longevity: 11 mo
Battery Voltage: 2.68 V
Brady Statistic RV Percent Paced: 81 %
Date Time Interrogation Session: 20171030115822
Implantable Lead Implant Date: 20100205
Implantable Lead Location: 753860
Implantable Lead Model: 5076
Implantable Pulse Generator Implant Date: 20100205
Lead Channel Impedance Value: 0 Ohm
Lead Channel Impedance Value: 422 Ohm
Lead Channel Pacing Threshold Amplitude: 0.875 V
Lead Channel Pacing Threshold Pulse Width: 0.4 ms
Lead Channel Setting Pacing Amplitude: 2.5 V
Lead Channel Setting Pacing Pulse Width: 0.4 ms
Lead Channel Setting Sensing Sensitivity: 4 mV

## 2016-03-08 ENCOUNTER — Ambulatory Visit (INDEPENDENT_AMBULATORY_CARE_PROVIDER_SITE_OTHER): Payer: Commercial Managed Care - HMO | Admitting: Cardiovascular Disease

## 2016-03-08 ENCOUNTER — Encounter: Payer: Self-pay | Admitting: Cardiovascular Disease

## 2016-03-08 VITALS — BP 118/66 | HR 80 | Ht 73.0 in | Wt 234.2 lb

## 2016-03-08 DIAGNOSIS — I25810 Atherosclerosis of coronary artery bypass graft(s) without angina pectoris: Secondary | ICD-10-CM

## 2016-03-08 DIAGNOSIS — E7800 Pure hypercholesterolemia, unspecified: Secondary | ICD-10-CM | POA: Diagnosis not present

## 2016-03-08 DIAGNOSIS — I482 Chronic atrial fibrillation: Secondary | ICD-10-CM | POA: Diagnosis not present

## 2016-03-08 DIAGNOSIS — Z950 Presence of cardiac pacemaker: Secondary | ICD-10-CM

## 2016-03-08 DIAGNOSIS — I10 Essential (primary) hypertension: Secondary | ICD-10-CM

## 2016-03-08 DIAGNOSIS — N183 Chronic kidney disease, stage 3 unspecified: Secondary | ICD-10-CM

## 2016-03-08 DIAGNOSIS — I4821 Permanent atrial fibrillation: Secondary | ICD-10-CM

## 2016-03-08 DIAGNOSIS — F1010 Alcohol abuse, uncomplicated: Secondary | ICD-10-CM

## 2016-03-08 DIAGNOSIS — I5032 Chronic diastolic (congestive) heart failure: Secondary | ICD-10-CM

## 2016-03-08 NOTE — Progress Notes (Signed)
Cardiology Office Note  Date:  03/08/2016   ID:  [name], DOB 04-20-1933, MRN [id number]  PCP:  Maryland Pink, MD   Chief Complaint  Patient presents with  . OTHER    6 month f/u c/o chest pain and sob. Meds reviewed verbally with pt.    HPI:  82 year old gentleman with a history of coronary artery disease, bypass surgery in 1999 with catheterization in 2007 with patent LIMA to the LAD, occluded vein graft to the OM, history of Cypher stent to the PL branch and mid RCA in September 07, ejection fraction 45-50%, chronic atrial fibrillation with [employer]tronic pacemaker, hx of previous recurrent falls and injury from alcohol abuse, Pacemaker was placed by [facility] in 2010 for syncope, AFib with pauses.  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 He presents for routine follow-up of his coronary artery disease and atrial fibrillation  Significant stress at home, son has metastatic cancer Has severe left shoulder pain,  Pain not relieved with Tylenol. Unable to take NSAIDs secondary to chronic kidney disease. Has not seen orthopedics for further evaluation  Reports stable breathing, no leg edema, stable weight Takes Torsemide every other day Currently not on potassium  Alcohol intake not discussed with him today EKG on today's visit shows paced rhythm 80 bpm He is concerned about recent pacer download and battery life Was told he needed new battery before the end of the year  Other past medical history reviewed Previous evaluation in the hospital for suicidal ideation in the setting of his son being ill with cancer  Torsemide decreased down to 20 mg every other day as creatinine was greater than 3, well above his baseline  Previously he reports he was taking torsemide only as needed  Continues to drink alcohol, recent alcohol level in the hospital markedly elevated Tolerating anticoagulation, eliquis.  He is on low-dose,  high fall risk  EKG shows atrial fibrillation, ventricular paced rhythm  Other past medical history In September 2016 suffered left foot fracture/ankle. This is his second ankle fracture, both from falls  Previous hospitalization for malaise, dehydration, creatinine greater than 3. Lisinopril and Lasix was discontinued.   Previous echocardiogram  showed mild to moderate pulmonary hypertension. Creatinine of 2.   History of positive sleep study, was on CPAP for several months. Recent change to his insurance now has no coverage  Previous lab work from 10/23/2012 shows total cholesterol 212, LDL 142, HDL 50, creatinine 1.7, BUN 22 He is status post right knee replacement.    PMH:   has a past medical history of AAA (abdominal aortic aneurysm) ([place]); Broken ankle; Cancer ([place]); Chronic atrial fibrillation ([place]); Chronic diastolic CHF (congestive heart failure) ([place]); Chronic obstructive pulmonary disease (HCC); CKD (chronic kidney disease), stage III; Coronary artery disease; GERD (gastroesophageal reflux disease); Hyperlipidemia; Hypertension; PVD (peripheral vascular disease) ([place]); Sleep apnea; and Tachy-brady syndrome ([place]).  PSH:    Past Surgical History:  Procedure Laterality Date  . ABDOMINAL AORTIC ANEURYSM REPAIR    . ANKLE SURGERY Right   . CORONARY ARTERY BYPASS GRAFT    . FOOT SURGERY     left foot surgery to remove cyst.  . INSERT / REPLACE / REMOVE PACEMAKER    . ORIF ANKLE FRACTURE Left 01/08/2015   Procedure: OPEN REDUCTION INTERNAL FIXATION (ORIF) ANKLE FRACTURE;  Surgeon: Earnestine Leys, MD;  Location: [facility] ORS;  Service: Orthopedics;  Laterality: Left;  . skin cancer biopsy     nose  .  TRIGGER FINGER RELEASE      Current Outpatient Prescriptions  Medication Sig Dispense Refill  . acetaminophen (TYLENOL) 325 MG tablet Take 650 mg by mouth every 6 (six) hours as needed.    [name] apixaban (ELIQUIS) 2.5 MG TABS tablet Take 1 tablet (2.5 mg total) by mouth 2 (two)  times daily. 60 tablet 6  . diltiazem (CARDIZEM CD) 120 MG 24 hr capsule Take 1 capsule (120 mg total) by mouth daily. (Patient taking differently: Take 120 mg by mouth daily with lunch. ) 30 capsule 3  . donepezil (ARICEPT) 5 MG tablet Take 5 mg by mouth at bedtime.    . metoprolol succinate (TOPROL-XL) 100 MG 24 hr tablet Take 1 tablet (100 mg total) by mouth daily. (Patient taking differently: Take 100 mg by mouth daily with lunch. ) 30 tablet 3  . nitroGLYCERIN (NITROSTAT) 0.4 MG SL tablet Place 1 tablet (0.4 mg total) under the tongue every 5 (five) minutes as needed for chest pain. 25 tablet 3  . omeprazole (PRILOSEC) 20 MG capsule Take 20 mg by mouth daily with lunch.     . simvastatin (ZOCOR) 20 MG tablet Take 1 tablet (20 mg total) by mouth at bedtime. 30 tablet 6  . Tamsulosin HCl (FLOMAX) 0.4 MG CAPS Take 0.4 mg by mouth daily after supper.    . torsemide (DEMADEX) 20 MG tablet Take 1 tablet (20 mg total) by mouth 2 (two) times daily as needed (for weight gain). (Patient taking differently: Take 20 mg by mouth every other day. ) 60 tablet 6   No current facility-administered medications for this visit.      Allergies:   Lipitor [atorvastatin]   Social History:  The patient  reports that he quit smoking about 18 years ago. He has a 20.00 pack-year smoking history. He has never used smokeless tobacco. He reports that he does not drink alcohol or use drugs.   Family History:   family history includes Cerebral aneurysm in his father; Coronary artery disease in his father and mother; Diabetes in his father and mother; Hypertension in his father and mother; Stomach cancer in his mother.    Review of Systems: Review of Systems  Constitutional: Negative.   Respiratory: Negative.   Cardiovascular: Negative.   Gastrointestinal: Negative.   Musculoskeletal: Positive for joint pain.       Severe left shoulder pain  Neurological: Negative.   Psychiatric/Behavioral: Negative.   All other  systems reviewed and are negative.    PHYSICAL EXAM: VS:  BP 118/66 (BP Location: Left Arm, Patient Position: Sitting, Cuff Size: Large)   Pulse 80   Ht 6\' 1"  (1.854 m)   Wt 234 lb 4 oz (106.3 kg)   BMI 30.91 kg/m  , BMI Body mass index is 30.91 kg/m. GEN: Well nourished, well developed, in no acute distress, obese  HEENT: normal  Neck: no JVD, carotid bruits, or masses Cardiac: RRR; no murmurs, rubs, or gallops,no edema  Respiratory:  clear to auscultation bilaterally, normal work of breathing GI: soft, nontender, nondistended, + BS MS: no deformity or atrophy  Skin: warm and dry, no rash Neuro:  Strength and sensation are intact Psych: euthymic mood, full affect    Recent Labs: 11/24/2015: ALT 11 11/25/2015: Hemoglobin 11.2; Platelets 200 11/26/2015: BUN 32; Creatinine, Ser 2.55; Potassium 3.7; Sodium 142    Lipid Panel Lab Results  Component Value Date   CHOL 140 08/06/2014   HDL 53 08/06/2014   LDLCALC 69 08/06/2014   TRIG  90 08/06/2014      Wt Readings from Last 3 Encounters:  03/08/16 234 lb 4 oz (106.3 kg)  11/26/15 229 lb 4.5 oz (104 kg)  09/08/15 234 lb 8 oz (106.4 kg)       ASSESSMENT AND PLAN:  Permanent atrial fibrillation (HCC) - Plan: EKG XX123456, Basic Metabolic Panel (BMET) Paced rhythm, rate controlled Tolerating anticoagulation  Essential hypertension - Plan: EKG XX123456, Basic Metabolic Panel (BMET) Blood pressure is well controlled on today's visit. No changes made to the medications.  Pure hypercholesterolemia Last year's labs, Cholesterol is at goal on the current lipid regimen. No changes to the medications were made.  Atherosclerosis of coronary artery bypass graft of native heart without angina pectoris Currently with no symptoms of angina. No further workup at this time. Continue current medication regimen.  Chronic diastolic heart failure (HCC) We'll continue torsemide 20 mg every other day Lab work scheduled for today to  check renal function, potassium  PACEMAKER, PERMANENT Recent download discussed with him 11 months remaining on battery per download end of October 2017 He is scheduled for monthly downloads  CKD (chronic kidney disease), stage III Labs drawn today  Alcohol abuse Long history of alcohol abuse, drinking issues not discussed with him  Grief Long discussion concerning son's illness, metastatic cancer Outlook is poor   Total encounter time more than 25 minutes  Greater than 50% was spent in counseling and coordination of care with the patient   Disposition:   F/U  6 months   Orders Placed This Encounter  Procedures  . Basic Metabolic Panel (BMET)  . EKG 12-Lead     Signed, Esmond Plants, M.D., Ph.D. 03/08/2016  [place]

## 2016-03-08 NOTE — Patient Instructions (Addendum)
Medication Instructions:   No medication changes made  Labwork:  BMET  Testing/Procedures:  No further testing at this time   I recommend watching educational videos on topics of interest to you at:       [email or web address]  Enter code: [email or web address]    Follow-Up: It was a pleasure seeing you in the office today. Please call us if you have new issues that need to be addressed before your next appt.  [phone]  Your physician wants you to follow-up in: 6 months.  You will receive a reminder letter in the mail two months in advance. If you don't receive a letter, please call our office to schedule the follow-up appointment.  If you need a refill on your cardiac medications before your next appointment, please call your pharmacy.

## 2016-03-09 LAB — BASIC METABOLIC PANEL
BUN/Creatinine Ratio: 15 (ref 10–24)
BUN: 51 mg/dL — ABNORMAL HIGH (ref 8–27)
CALCIUM: 9.3 mg/dL (ref 8.6–10.2)
CO2: 23 mmol/L (ref 18–29)
CREATININE: 3.4 mg/dL — AB (ref 0.76–1.27)
Chloride: 101 mmol/L (ref 96–106)
GFR calc Af Amer: 19 mL/min/{1.73_m2} — ABNORMAL LOW (ref >59)
GFR, EST NON AFRICAN AMERICAN: 16 mL/min/{1.73_m2} — AB (ref >59)
Glucose: 107 mg/dL — ABNORMAL HIGH (ref 65–99)
Potassium: 4.8 mmol/L (ref 3.5–5.2)
SODIUM: 141 mmol/L (ref 134–144)

## 2016-03-11 ENCOUNTER — Ambulatory Visit (INDEPENDENT_AMBULATORY_CARE_PROVIDER_SITE_OTHER): Payer: Commercial Managed Care - HMO | Admitting: *Deleted

## 2016-03-11 DIAGNOSIS — Z950 Presence of cardiac pacemaker: Secondary | ICD-10-CM

## 2016-03-11 NOTE — Progress Notes (Signed)
Remote pacemaker transmission.   

## 2016-03-22 ENCOUNTER — Other Ambulatory Visit: Payer: Self-pay | Admitting: *Deleted

## 2016-03-22 ENCOUNTER — Encounter: Payer: Self-pay | Admitting: Cardiology

## 2016-03-22 MED ORDER — TORSEMIDE 20 MG PO TABS: 20.0000 mg | ORAL_TABLET | Freq: Two times a day (BID) | ORAL | 6 refills | Status: DC | PRN

## 2016-03-30 ENCOUNTER — Telehealth: Payer: Self-pay | Admitting: Cardiovascular Disease

## 2016-03-30 NOTE — Telephone Encounter (Signed)
S/w patient's wife as patient was outside. She said the patient is in so much pain from his shoulder to his whole body sometimes and all he was told he could take was tylenol. She wanted to know if there was anything else he could take. Advised all we can suggest is OTC tylenol. I encouraged her to contact his PCP as well. She verbalized understanding.

## 2016-03-30 NOTE — Telephone Encounter (Signed)
Mrs. Kok called and wants to know what pain medication that Maize can take. He is in a lot pain from severe joint pain. Please call [name].

## 2016-04-12 ENCOUNTER — Ambulatory Visit (INDEPENDENT_AMBULATORY_CARE_PROVIDER_SITE_OTHER): Payer: Medicare HMO | Admitting: *Deleted

## 2016-04-12 DIAGNOSIS — I4891 Unspecified atrial fibrillation: Secondary | ICD-10-CM | POA: Diagnosis not present

## 2016-04-13 LAB — CUP PACEART REMOTE DEVICE CHECK
Battery Remaining Longevity: 9 mo
Date Time Interrogation Session: 20171201131906
Implantable Lead Implant Date: 20100205
Implantable Lead Model: 5076
Implantable Pulse Generator Implant Date: 20100205
Lead Channel Impedance Value: 402 Ohm
Lead Channel Pacing Threshold Amplitude: 0.875 V
Lead Channel Pacing Threshold Pulse Width: 0.4 ms
MDC IDC LEAD LOCATION: 753860
MDC IDC MSMT BATTERY IMPEDANCE: 4917 Ohm
MDC IDC MSMT BATTERY VOLTAGE: 2.69 V
MDC IDC MSMT LEADCHNL RA IMPEDANCE VALUE: 0 Ohm
MDC IDC SET LEADCHNL RV PACING AMPLITUDE: 2.5 V
MDC IDC SET LEADCHNL RV PACING PULSEWIDTH: 0.4 ms
MDC IDC SET LEADCHNL RV SENSING SENSITIVITY: 4 mV
MDC IDC STAT BRADY RV PERCENT PACED: 82 %

## 2016-04-15 ENCOUNTER — Encounter: Payer: Self-pay | Admitting: Cardiology

## 2016-04-15 LAB — CUP PACEART REMOTE DEVICE CHECK
Battery Impedance: 5167 Ohm
Battery Voltage: 2.68 V
Implantable Lead Implant Date: 20100205
Implantable Lead Location: 753860
Implantable Pulse Generator Implant Date: 20100205
Lead Channel Pacing Threshold Pulse Width: 0.4 ms
Lead Channel Setting Sensing Sensitivity: 4 mV
MDC IDC MSMT BATTERY REMAINING LONGEVITY: 8 mo
MDC IDC MSMT LEADCHNL RA IMPEDANCE VALUE: 0 Ohm
MDC IDC MSMT LEADCHNL RV IMPEDANCE VALUE: 414 Ohm
MDC IDC MSMT LEADCHNL RV PACING THRESHOLD AMPLITUDE: 0.875 V
MDC IDC SESS DTM: 20180102153042
MDC IDC SET LEADCHNL RV PACING AMPLITUDE: 2.5 V
MDC IDC SET LEADCHNL RV PACING PULSEWIDTH: 0.4 ms
MDC IDC STAT BRADY RV PERCENT PACED: 82 %

## 2016-05-13 ENCOUNTER — Encounter: Payer: Medicare HMO | Admitting: *Deleted

## 2016-05-18 ENCOUNTER — Encounter: Payer: Self-pay | Admitting: Cardiology

## 2016-05-27 ENCOUNTER — Telehealth: Payer: Self-pay | Admitting: Internal Medicine

## 2016-05-27 ENCOUNTER — Ambulatory Visit (INDEPENDENT_AMBULATORY_CARE_PROVIDER_SITE_OTHER): Payer: Medicare HMO | Admitting: *Deleted

## 2016-05-27 DIAGNOSIS — Z950 Presence of cardiac pacemaker: Secondary | ICD-10-CM

## 2016-05-27 NOTE — Telephone Encounter (Signed)
Per pt did another transmission and it sounded the same as always.   Please give him a call if it did not come through.

## 2016-06-07 ENCOUNTER — Encounter: Payer: Self-pay | Admitting: Cardiology

## 2016-06-07 LAB — CUP PACEART REMOTE DEVICE CHECK
Battery Impedance: 5158 Ohm
Battery Remaining Longevity: 8 mo
Battery Voltage: 2.68 V
Date Time Interrogation Session: 20180216151550
Implantable Lead Implant Date: 20100205
Implantable Lead Location: 753860
Implantable Pulse Generator Implant Date: 20100205
Lead Channel Pacing Threshold Pulse Width: 0.4 ms
Lead Channel Setting Sensing Sensitivity: 4 mV
MDC IDC MSMT LEADCHNL RA IMPEDANCE VALUE: 0 Ohm
MDC IDC MSMT LEADCHNL RV IMPEDANCE VALUE: 439 Ohm
MDC IDC MSMT LEADCHNL RV PACING THRESHOLD AMPLITUDE: 0.75 V
MDC IDC SET LEADCHNL RV PACING AMPLITUDE: 2.5 V
MDC IDC SET LEADCHNL RV PACING PULSEWIDTH: 0.4 ms
MDC IDC STAT BRADY RV PERCENT PACED: 83 %

## 2016-06-07 NOTE — Progress Notes (Signed)
Remote pacemaker transmission.   

## 2016-06-28 DIAGNOSIS — D485 Neoplasm of uncertain behavior of skin: Secondary | ICD-10-CM | POA: Diagnosis not present

## 2016-06-28 DIAGNOSIS — C44622 Squamous cell carcinoma of skin of right upper limb, including shoulder: Secondary | ICD-10-CM | POA: Diagnosis not present

## 2016-06-28 DIAGNOSIS — C44629 Squamous cell carcinoma of skin of left upper limb, including shoulder: Secondary | ICD-10-CM | POA: Diagnosis not present

## 2016-06-28 DIAGNOSIS — L728 Other follicular cysts of the skin and subcutaneous tissue: Secondary | ICD-10-CM | POA: Diagnosis not present

## 2016-06-28 DIAGNOSIS — X32XXXA Exposure to sunlight, initial encounter: Secondary | ICD-10-CM | POA: Diagnosis not present

## 2016-06-28 DIAGNOSIS — L821 Other seborrheic keratosis: Secondary | ICD-10-CM | POA: Diagnosis not present

## 2016-06-28 DIAGNOSIS — Z85828 Personal history of other malignant neoplasm of skin: Secondary | ICD-10-CM | POA: Diagnosis not present

## 2016-06-28 DIAGNOSIS — L570 Actinic keratosis: Secondary | ICD-10-CM | POA: Diagnosis not present

## 2016-07-08 ENCOUNTER — Ambulatory Visit (INDEPENDENT_AMBULATORY_CARE_PROVIDER_SITE_OTHER): Payer: Medicare HMO | Admitting: *Deleted

## 2016-07-08 DIAGNOSIS — Z950 Presence of cardiac pacemaker: Secondary | ICD-10-CM

## 2016-07-08 NOTE — Progress Notes (Signed)
Remote pacemaker transmission.   

## 2016-07-11 ENCOUNTER — Encounter: Payer: Self-pay | Admitting: Cardiology

## 2016-07-11 LAB — CUP PACEART REMOTE DEVICE CHECK
Battery Remaining Longevity: 7 mo
Battery Voltage: 2.67 V
Brady Statistic RV Percent Paced: 84 %
Date Time Interrogation Session: 20180330124209
Implantable Lead Location: 753860
Implantable Lead Model: 5076
Implantable Pulse Generator Implant Date: 20100205
Lead Channel Impedance Value: 428 Ohm
Lead Channel Pacing Threshold Pulse Width: 0.4 ms
MDC IDC LEAD IMPLANT DT: 20100205
MDC IDC MSMT BATTERY IMPEDANCE: 5318 Ohm
MDC IDC MSMT LEADCHNL RA IMPEDANCE VALUE: 0 Ohm
MDC IDC MSMT LEADCHNL RV PACING THRESHOLD AMPLITUDE: 0.875 V
MDC IDC SET LEADCHNL RV PACING AMPLITUDE: 2.5 V
MDC IDC SET LEADCHNL RV PACING PULSEWIDTH: 0.4 ms
MDC IDC SET LEADCHNL RV SENSING SENSITIVITY: 4 mV

## 2016-07-22 ENCOUNTER — Encounter: Payer: Self-pay | Admitting: Emergency Medicine

## 2016-07-22 ENCOUNTER — Emergency Department: Payer: Medicare HMO

## 2016-07-22 ENCOUNTER — Emergency Department
Admission: EM | Admit: 2016-07-22 | Discharge: 2016-07-22 | Disposition: A | Discharge status: Home / Self Care | Payer: Medicare HMO | Attending: Emergency Medicine | Admitting: Emergency Medicine

## 2016-07-22 DIAGNOSIS — I5032 Chronic diastolic (congestive) heart failure: Secondary | ICD-10-CM | POA: Insufficient documentation

## 2016-07-22 DIAGNOSIS — S4991XA Unspecified injury of right shoulder and upper arm, initial encounter: Secondary | ICD-10-CM | POA: Diagnosis present

## 2016-07-22 DIAGNOSIS — Z79899 Other long term (current) drug therapy: Secondary | ICD-10-CM | POA: Insufficient documentation

## 2016-07-22 DIAGNOSIS — W1839XA Other fall on same level, initial encounter: Secondary | ICD-10-CM | POA: Diagnosis not present

## 2016-07-22 DIAGNOSIS — N183 Chronic kidney disease, stage 3 (moderate): Secondary | ICD-10-CM | POA: Insufficient documentation

## 2016-07-22 DIAGNOSIS — I2510 Atherosclerotic heart disease of native coronary artery without angina pectoris: Secondary | ICD-10-CM | POA: Diagnosis not present

## 2016-07-22 DIAGNOSIS — R079 Chest pain, unspecified: Secondary | ICD-10-CM | POA: Diagnosis not present

## 2016-07-22 DIAGNOSIS — W19XXXA Unspecified fall, initial encounter: Secondary | ICD-10-CM

## 2016-07-22 DIAGNOSIS — M25512 Pain in left shoulder: Secondary | ICD-10-CM | POA: Diagnosis not present

## 2016-07-22 DIAGNOSIS — Y939 Activity, unspecified: Secondary | ICD-10-CM | POA: Insufficient documentation

## 2016-07-22 DIAGNOSIS — M79601 Pain in right arm: Secondary | ICD-10-CM | POA: Diagnosis not present

## 2016-07-22 DIAGNOSIS — Z87891 Personal history of nicotine dependence: Secondary | ICD-10-CM | POA: Diagnosis not present

## 2016-07-22 DIAGNOSIS — Z85828 Personal history of other malignant neoplasm of skin: Secondary | ICD-10-CM | POA: Diagnosis not present

## 2016-07-22 DIAGNOSIS — R0602 Shortness of breath: Secondary | ICD-10-CM | POA: Diagnosis not present

## 2016-07-22 DIAGNOSIS — Y929 Unspecified place or not applicable: Secondary | ICD-10-CM | POA: Diagnosis not present

## 2016-07-22 DIAGNOSIS — I130 Hypertensive heart and chronic kidney disease with heart failure and stage 1 through stage 4 chronic kidney disease, or unspecified chronic kidney disease: Secondary | ICD-10-CM | POA: Diagnosis not present

## 2016-07-22 DIAGNOSIS — J449 Chronic obstructive pulmonary disease, unspecified: Secondary | ICD-10-CM | POA: Diagnosis not present

## 2016-07-22 DIAGNOSIS — Y999 Unspecified external cause status: Secondary | ICD-10-CM | POA: Diagnosis not present

## 2016-07-22 LAB — CBC
HEMATOCRIT: 34.7 % — AB (ref 40.0–52.0)
HEMOGLOBIN: 11.5 g/dL — AB (ref 13.0–18.0)
MCH: 30.6 pg (ref 26.0–34.0)
MCHC: 33.2 g/dL (ref 32.0–36.0)
MCV: 92.3 fL (ref 80.0–100.0)
PLATELETS: 173 10*3/uL (ref 150–440)
RBC: 3.77 MIL/uL — AB (ref 4.40–5.90)
RDW: 15.7 % — ABNORMAL HIGH (ref 11.5–14.5)
WBC: 6.4 10*3/uL (ref 3.8–10.6)

## 2016-07-22 LAB — BASIC METABOLIC PANEL
ANION GAP: 6 (ref 5–15)
BUN: 32 mg/dL — ABNORMAL HIGH (ref 6–20)
CALCIUM: 8.6 mg/dL — AB (ref 8.9–10.3)
CO2: 27 mmol/L (ref 22–32)
Chloride: 106 mmol/L (ref 101–111)
Creatinine, Ser: 3.16 mg/dL — ABNORMAL HIGH (ref 0.61–1.24)
GFR calc non Af Amer: 17 mL/min — ABNORMAL LOW (ref >60)
GFR, EST AFRICAN AMERICAN: 20 mL/min — AB (ref >60)
Glucose, Bld: 91 mg/dL (ref 65–99)
POTASSIUM: 4.4 mmol/L (ref 3.5–5.1)
Sodium: 139 mmol/L (ref 135–145)

## 2016-07-22 LAB — TROPONIN I: Troponin I: <0.03 ng/mL (ref <0.03)

## 2016-07-22 MED ORDER — GUAIFENESIN ER 600 MG PO TB12: 600.0000 mg | ORAL_TABLET | Freq: Two times a day (BID) | ORAL | 0 refills | Status: DC | PRN

## 2016-07-22 MED ORDER — TRAMADOL HCL 50 MG PO TABS: 50.0000 mg | ORAL_TABLET | Freq: Four times a day (QID) | ORAL | 0 refills | Status: DC | PRN

## 2016-07-22 NOTE — ED Triage Notes (Signed)
Pt reports left sided chest, left arm pain and shortness of breath x3 days. Pt reports increased pain at pacemaker site. Wife reports pt's batteries need to be replaced.

## 2016-07-22 NOTE — Discharge Instructions (Signed)
Please call the number provided for orthopedics to arrange a follow-up appointment as soon as possible. Please take Tylenol or your prescribed pain medication as needed for discomfort. Return to the emergency department for any further chest pain, trouble breathing, worsening pain, or any other symptom personally concerning to yourself.

## 2016-07-22 NOTE — ED Notes (Signed)
Patient transported to X-ray 

## 2016-07-22 NOTE — ED Provider Notes (Signed)
[facility] Emergency Department Provider Note  Time seen: 7:37 PM  I have reviewed the triage vital signs and the nursing notes.   HISTORY  Chief Complaint Chest Pain    HPI [name] is a 83 y.o. male with a past medical history of COPD, CK D, gastric reflux, hypertension, hyperlipidemia, presents to the emergency department for left shoulder pain after a fall. According to the patient he is had mild left shoulder pain chronically. However 2 days ago he had a fall onto his left side and since that time he's had significant left shoulder pain and difficulty moving his left arm above his head.Patient states mild shortness of breath over this time period as well other admits a history of COPD. Has a pacemaker in the triage note he does note pain over the pacemaker site however during my evaluation he denies this. Patient currently describes his discomfort as mild but moderate with attempted movement.  Past Medical History:  Diagnosis Date  . AAA (abdominal aortic aneurysm) ([place])    a. s/p repair - 1999 @ [facility] @ time of CABG.  . Broken ankle   . Cancer ([place])    skin cancer nose  . Chronic atrial fibrillation (HCC)    a. on pradaxa  . Chronic diastolic CHF (congestive heart failure) ([place])    a. 01/2012 Echo:  EF 55%.  . Chronic obstructive pulmonary disease ([place])   . CKD (chronic kidney disease), stage III    a. Acute on chronic 03/2012  . Coronary artery disease    a. s/p MI x 3;  b. s/p CABG x2 in 1999;  c. Cath 2007: LIMA->LAD patent, VG->OM 100, PCI/DES of RPL/mid RCA (cypher DES).  . GERD (gastroesophageal reflux disease)   . Hyperlipidemia   . Hypertension   . PVD (peripheral vascular disease) ([place])   . Sleep apnea   . Tachy-brady syndrome ([place])    a. s/p PPM 2010.    Patient Active Problem List   Diagnosis Date Noted  . Syncope 11/24/2015  . Depression, major, single episode, severe ([place]) 08/18/2015  . Alcohol abuse 08/18/2015  .  Suicidal ideation 08/18/2015  . Bimalleolar fracture of left ankle 01/07/2015  . COPD exacerbation ([place]) 08/22/2014  . Coronary artery disease   . Chronic atrial fibrillation ([place])   . Chronic diastolic CHF (congestive heart failure) ([place])   . Tachy-brady syndrome ([place])   . CKD (chronic kidney disease), stage III 04/09/2012  . Chronic diastolic heart failure ([place]) 01/26/2012  . Chest pain 09/23/2010  . Hyperlipidemia 06/12/2009  . Essential hypertension 06/12/2009  . Coronary atherosclerosis of artery bypass graft 06/12/2009  . ATRIAL FIBRILLATION 06/12/2009  . AAA 06/12/2009  . COPD 06/12/2009  . GERD 06/12/2009  . PACEMAKER, PERMANENT 06/12/2009    Past Surgical History:  Procedure Laterality Date  . ABDOMINAL AORTIC ANEURYSM REPAIR    . ANKLE SURGERY Right   . CORONARY ARTERY BYPASS GRAFT    . FOOT SURGERY     left foot surgery to remove cyst.  . INSERT / REPLACE / REMOVE PACEMAKER    . ORIF ANKLE FRACTURE Left 01/08/2015   Procedure: OPEN REDUCTION INTERNAL FIXATION (ORIF) ANKLE FRACTURE;  Surgeon: Earnestine Leys, MD;  Location: [facility] ORS;  Service: Orthopedics;  Laterality: Left;  . skin cancer biopsy     nose  . TRIGGER FINGER RELEASE      Prior to Admission medications   Medication Sig Start Date End Date Taking? Authorizing Provider  acetaminophen (TYLENOL) 325 MG tablet Take 650 mg by mouth every 6 (six) hours as needed.    Historical Provider, MD  apixaban (ELIQUIS) 2.5 MG TABS tablet Take 1 tablet (2.5 mg total) by mouth 2 (two) times daily. 12/02/15   Minna Merritts, MD  diltiazem (CARDIZEM CD) 120 MG 24 hr capsule Take 1 capsule (120 mg total) by mouth daily. Patient taking differently: Take 120 mg by mouth daily with lunch.  07/15/14   Deboraha Sprang, MD  donepezil (ARICEPT) 5 MG tablet Take 5 mg by mouth at bedtime.    Historical Provider, MD  metoprolol succinate (TOPROL-XL) 100 MG 24 hr tablet Take 1 tablet (100 mg total) by mouth daily. Patient taking  differently: Take 100 mg by mouth daily with lunch.  06/03/13   Deboraha Sprang, MD  nitroGLYCERIN (NITROSTAT) 0.4 MG SL tablet Place 1 tablet (0.4 mg total) under the tongue every 5 (five) minutes as needed for chest pain. 10/06/15   Minna Merritts, MD  omeprazole (PRILOSEC) 20 MG capsule Take 20 mg by mouth daily with lunch.     Historical Provider, MD  simvastatin (ZOCOR) 20 MG tablet Take 1 tablet (20 mg total) by mouth at bedtime. 11/13/13   Minna Merritts, MD  Tamsulosin HCl (FLOMAX) 0.4 MG CAPS Take 0.4 mg by mouth daily after supper.    Historical Provider, MD  torsemide (DEMADEX) 20 MG tablet Take 1 tablet (20 mg total) by mouth 2 (two) times daily as needed (for weight gain). 03/22/16   Minna Merritts, MD    Allergies  Allergen Reactions  . Lipitor [Atorvastatin] Other (See Comments)    Arm pain    Family History  Problem Relation Age of Onset  . Stomach cancer Mother     died @ 20  . Diabetes Mother   . Hypertension Mother   . Coronary artery disease Mother   . Cerebral aneurysm Father     died @ 79  . Diabetes Father   . Hypertension Father   . Coronary artery disease Father     Social History Social History  Substance Use Topics  . Smoking status: Former Smoker    Packs/day: 1.00    Years: 20.00    Quit date: 04/11/1997  . Smokeless tobacco: Never Used  . Alcohol use No     Comment: 1 bottle of bourbon a week     Review of Systems Constitutional: Negative for fever. Cardiovascular: Negative for chest pain. Respiratory: Mild shortness breath other largely unchanged from baseline. Gastrointestinal: Negative for abdominal pain Musculoskeletal: Left shoulder pain Neurological: Negative for headache 10-point ROS otherwise negative.  ____________________________________________   PHYSICAL EXAM:  VITAL SIGNS: ED Triage Vitals  Enc Vitals Group     BP 07/22/16 1747 136/63     Pulse Rate 07/22/16 1747 66     Resp 07/22/16 1747 17     Temp 07/22/16 1748  97.6 F (36.4 C)     Temp Source 07/22/16 1748 Oral     SpO2 07/22/16 1747 94 %     Weight 07/22/16 1748 225 lb (102.1 kg)     Height 07/22/16 1748 6\' 1"  (1.854 m)     Head Circumference --      Peak Flow --      Pain Score 07/22/16 1747 6     Pain Loc --      Pain Edu? --      Excl. in [place]? --  Constitutional: Alert and oriented. Well appearing and in no distress. Eyes: Normal exam ENT   Head: Normocephalic and atraumatic.   Mouth/Throat: Mucous membranes are moist. Cardiovascular: Normal rate, regular rhythm. No murmurs, rubs, or gallops. Respiratory: Normal respiratory effort without tachypnea nor retractions. Breath sounds are clear Gastrointestinal: Soft and nontender. No distention.   Musculoskeletal: Mild left shoulder tenderness to palpation. Good range of motion with passive movement however with active movement the patient has difficulty raising the left arm above 90. With passive movement he is able to raise above 90 however he is not able to hold it there once released. Neurovascular intact distally. Neurologic:  Normal speech and language. No gross focal neurologic deficits Skin:  Skin is warm, dry and intact.  Psychiatric: Mood and affect are normal.   ____________________________________________    EKG  EKG reviewed and interpreted by myself shows a ventricular paced rhythm at 67 bpm with a widened QRS, no concerning ST changes noted.  ____________________________________________    RADIOLOGY  Chest x-ray negative  ____________________________________________   INITIAL IMPRESSION / ASSESSMENT AND PLAN / ED COURSE  Pertinent labs & imaging results that were available during my care of the patient were reviewed by me and considered in my medical decision making (see chart for details).  Patient denies the emergency department with left shoulder pain after a fall 2 days ago. He does state mild shortness breath as well although denies any increased  shortness breath over baseline. 96% currently. Patient's x-ray shows no acute abnormalities. Patient's discomfort all appears to be located in the left shoulder. Given his difficulty maintaining the arm above 90 but good passive range of motion I highly suspect a rotator cuff injury. At the shoulder x-ray is negative we will refer the patient orthopedics for further workup. Patient's EKG shows a ventricular paced rhythm, troponin is negative. Do not suspect ACS.   Shoulder x-rays negative. Highly suspect rotator cuff injury. Labs are largely unchanged from baseline. We will discharge the patient with Ultram to be used as needed for discomfort and follow-up with Dr. Sabra Heck of orthopedics for further evaluation. Patient agreeable to plan.  ____________________________________________   FINAL CLINICAL IMPRESSION(S) / ED DIAGNOSES  Left shoulder pain    Harvest Dark, MD 07/22/16 1958

## 2016-07-26 ENCOUNTER — Ambulatory Visit (INDEPENDENT_AMBULATORY_CARE_PROVIDER_SITE_OTHER): Payer: Medicare HMO | Admitting: Internal Medicine

## 2016-07-26 ENCOUNTER — Encounter: Payer: Self-pay | Admitting: Internal Medicine

## 2016-07-26 VITALS — BP 162/90 | HR 70 | Ht 73.0 in | Wt 210.0 lb

## 2016-07-26 DIAGNOSIS — I482 Chronic atrial fibrillation: Secondary | ICD-10-CM | POA: Diagnosis not present

## 2016-07-26 DIAGNOSIS — R001 Bradycardia, unspecified: Secondary | ICD-10-CM | POA: Diagnosis not present

## 2016-07-26 DIAGNOSIS — I4821 Permanent atrial fibrillation: Secondary | ICD-10-CM

## 2016-07-26 LAB — CUP PACEART INCLINIC DEVICE CHECK
Battery Impedance: 5475 Ohm
Battery Remaining Longevity: 7 mo
Battery Voltage: 2.67 V
Brady Statistic RV Percent Paced: 84 %
Date Time Interrogation Session: 20180417143603
Implantable Lead Model: 5076
Lead Channel Impedance Value: 0 Ohm
Lead Channel Impedance Value: 478 Ohm
Lead Channel Setting Pacing Amplitude: 2.5 V
Lead Channel Setting Pacing Pulse Width: 0.4 ms
MDC IDC LEAD IMPLANT DT: 20100205
MDC IDC LEAD LOCATION: 753860
MDC IDC MSMT LEADCHNL RV PACING THRESHOLD AMPLITUDE: 0.75 V
MDC IDC MSMT LEADCHNL RV PACING THRESHOLD PULSEWIDTH: 0.4 ms
MDC IDC MSMT LEADCHNL RV SENSING INTR AMPL: 11.2 mV
MDC IDC PG IMPLANT DT: 20100205
MDC IDC SET LEADCHNL RV SENSING SENSITIVITY: 5.6 mV

## 2016-07-26 MED ORDER — PYRIDOSTIGMINE BROMIDE 60 MG PO TABS: 60.0000 mg | ORAL_TABLET | Freq: Two times a day (BID) | ORAL | 6 refills | Status: DC

## 2016-07-26 NOTE — Patient Instructions (Signed)
Medication Instructions: - Your physician has recommended you make the following change in your medication:  1) Start Mestinon 60 mg- take one tablet by mouth twice daily  Labwork: - none ordered  Procedures/Testing: - none ordered  Follow-Up: - Remote monitoring is used to monitor your Pacemaker of ICD from home. This monitoring reduces the number of office visits required to check your device to one time per year. It allows [place] to keep an eye on the functioning of your device to ensure it is working properly. You are scheduled for a device check from home on 08/23/16- battery check only. You may send your transmission at any time that day. If you have a wireless device, the transmission will be sent automatically. After your physician reviews your transmission, you will receive a postcard with your next transmission date.  - Your physician wants you to follow-up in: 6 months with Dr. Caryl Comes. You will receive a reminder letter in the mail two months in advance. If you don't receive a letter, please call our office to schedule the follow-up appointment.   Any Additional Special Instructions Will Be Listed Below (If Applicable).     If you need a refill on your cardiac medications before your next appointment, please call your pharmacy.

## 2016-07-26 NOTE — Progress Notes (Signed)
Patient Care Team: Maryland Pink, MD as PCP - General (Family Medicine) Minna Merritts, MD as Consulting Physician (Cardiology)   HPI  [name] is a 83 y.o. male Seen in follow-up for permanent atrial fibrillation with previously implanted pacemaker. He takes apixaban.  Has coronary artery disease with CABG surgery 1999, catheterization 2007 demonstrating patent LIMA and an occluded vein graft to the OM. History of Cypher stenting. Ejection fraction at that time was 45-50%.  Echocardiogram 2015 EF 55-65% with mild LAE and moderate pulmonary hypertension Myoview 6/15 demonstrated normal EF and no ischemia; no infarction  He  has renal insufficiency restricting the use of NSAIDs and diuretics  3 His having problems with recurrent falls. These are orthostatic.  He uses a shower chair.  He has some prodrome. Recovery phase is notable for diaphoresis and pallor and residual orthostatic intolerance. No chest pain;  stable dyspnea on exertion. Mild edema    *  Past Medical History:  Diagnosis Date  . AAA (abdominal aortic aneurysm) ([place])    a. s/p repair - 1999 @ [facility] @ time of CABG.  . Broken ankle   . Cancer ([place])    skin cancer nose  . Chronic atrial fibrillation (HCC)    a. on pradaxa  . Chronic diastolic CHF (congestive heart failure) ([place])    a. 01/2012 Echo:  EF 55%.  . Chronic obstructive pulmonary disease ([place])   . CKD (chronic kidney disease), stage III    a. Acute on chronic 03/2012  . Coronary artery disease    a. s/p MI x 3;  b. s/p CABG x2 in 1999;  c. Cath 2007: LIMA->LAD patent, VG->OM 100, PCI/DES of RPL/mid RCA (cypher DES).  . GERD (gastroesophageal reflux disease)   . Hyperlipidemia   . Hypertension   . PVD (peripheral vascular disease) ([place])   . Sleep apnea   . Tachy-brady syndrome ([place])    a. s/p PPM 2010.    Past Surgical History:  Procedure Laterality Date  . ABDOMINAL AORTIC ANEURYSM REPAIR    . ANKLE SURGERY Right   . CORONARY  ARTERY BYPASS GRAFT    . FOOT SURGERY     left foot surgery to remove cyst.  . INSERT / REPLACE / REMOVE PACEMAKER    . ORIF ANKLE FRACTURE Left 01/08/2015   Procedure: OPEN REDUCTION INTERNAL FIXATION (ORIF) ANKLE FRACTURE;  Surgeon: Earnestine Leys, MD;  Location: [facility] ORS;  Service: Orthopedics;  Laterality: Left;  . skin cancer biopsy     nose  . TRIGGER FINGER RELEASE      Current Outpatient Prescriptions  Medication Sig Dispense Refill  . acetaminophen (TYLENOL) 325 MG tablet Take 650 mg by mouth every 6 (six) hours as needed.    [name] apixaban (ELIQUIS) 2.5 MG TABS tablet Take 1 tablet (2.5 mg total) by mouth 2 (two) times daily. 60 tablet 6  . diltiazem (CARDIZEM CD) 120 MG 24 hr capsule Take 1 capsule (120 mg total) by mouth daily. (Patient taking differently: Take 120 mg by mouth daily with lunch. ) 30 capsule 3  . donepezil (ARICEPT) 5 MG tablet Take 5 mg by mouth at bedtime.    [name] guaiFENesin (MUCINEX) 600 MG 12 hr tablet Take 1 tablet (600 mg total) by mouth 2 (two) times daily as needed for cough or to loosen phlegm. 30 tablet 0  . metoprolol succinate (TOPROL-XL) 100 MG 24 hr tablet Take 1 tablet (100 mg total) by mouth daily. (Patient taking  differently: Take 100 mg by mouth daily with lunch. ) 30 tablet 3  . nitroGLYCERIN (NITROSTAT) 0.4 MG SL tablet Place 1 tablet (0.4 mg total) under the tongue every 5 (five) minutes as needed for chest pain. 25 tablet 3  . omeprazole (PRILOSEC) 20 MG capsule Take 20 mg by mouth daily with lunch.     . simvastatin (ZOCOR) 20 MG tablet Take 1 tablet (20 mg total) by mouth at bedtime. 30 tablet 6  . Tamsulosin HCl (FLOMAX) 0.4 MG CAPS Take 0.4 mg by mouth daily after supper.    . torsemide (DEMADEX) 20 MG tablet Take 1 tablet (20 mg total) by mouth 2 (two) times daily as needed (for weight gain). 60 tablet 6  . traMADol (ULTRAM) 50 MG tablet Take 1 tablet (50 mg total) by mouth every 6 (six) hours as needed. 20 tablet 0   No current  facility-administered medications for this visit.     Allergies  Allergen Reactions  . Lipitor [Atorvastatin] Other (See Comments)    Arm pain      Review of Systems negative except from HPI and PMH  Physical Exam BP (!) 162/90 (BP Location: Left Arm, Patient Position: Sitting, Cuff Size: Normal)   Pulse 70   Ht 6\' 1"  (1.854 m)   Wt 210 lb (95.3 kg)   BMI 27.71 kg/m  Well developed and obese in no acute distress HENT normal E scleral and icterus clear Neck Supple JVP flat; carotids brisk and full Clear to ausculation Regular rate and rhythm, no murmurs gallops or rub Soft with active bowel sounds No clubbing cyanosis 1+ Edema Alert and oriented, grossly normal motor and sensory function Skin Warm and Dry    Assessment and  Plan  Bradycardia with 84% ventricular pacing  Dyspnea on exertion/HFpEF  Orthostatic syncope  Pacemaker-Medtronic approaching ERI   The patient has a pacemaker approaching ERI we have reviewed a protocol for establishing remote connectivity.  He is now on results and we will check it monthly.    Recurrent orthostatic syncope with objective evidence of orthostasis today. We have discussed pharmacological therapy with Mestinon side effects of which I have reviewed or compressive clothing devices. He would prefer to try the former. In the event however when he sees Dr. Deidre Ala next couple of weeks if he has had interval falls I would strongly recommend some abdominal binder    He is currently taking apixaban at low dose.   We spent more than 50% of our >25 min visit in face to face counseling regarding the above    Current medicines are reviewed at length with the patient today .  The patient does not  have concerns regarding medicines.

## 2016-08-07 NOTE — Progress Notes (Signed)
Cardiology Office Note  Date:  08/09/2016   ID:  [name], DOB [birth year], MRN [id number]  PCP:  Maryland Pink, MD   Chief Complaint  Patient presents with  . OTHER    5 month f/u c/o upset stomach and "crazy dreams" with Mestinon . Meds reviewed verbally with pt.    HPI:  83 year old gentleman with a history of  recurrent falls and injury from alcohol abuse, coronary artery disease,  bypass surgery in 1999 with catheterization in 2007  patent LIMA to the LAD, occluded vein graft to the OM,  Cypher stent to the PL branch and mid RCA in September 07, ejection fraction 45-50%,  Chronic diastolic and systolic CHF chronic atrial fibrillation with Medtronic pacemaker,   Pacemaker in 2010 for syncope, AFib with pauses.  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 Chronic renal insufficiency He presents for routine follow-up of his coronary artery disease and atrial fibrillation  Recent stressors at home son died from metastatic rectal cancer  Recent falls He was in the ER evaluation 07/22/16 for fall Hospital records reviewed with the patient in detail  Recently seen by Dr. Caryl Comes, noted to have orthostasis Started on Mestinon 60 mg twice a day Reports that he is not tolerating the medication well, he is having Side effects, stomach issues Diarrhea, "cant hold it", having accidents Reports he was not having this side effects before starting medication. Requesting change of medication  Reports stable breathing, no leg edema, stable weight Takes Torsemide every other day Currently not on potassium  Alcohol intake not discussed with him today  EKG personally reviewed by myself on todays visit Sugars paced rhythm rate 86 bpm   Other past medical history reviewed Previous evaluation in the hospital for suicidal ideation in the setting of his son being ill with cancer  Torsemide decreased down to 20 mg every other day as creatinine was  greater than 3, well above his baseline  Previously he reports he was taking torsemide only as needed  Continues to drink alcohol, recent alcohol level in the hospital markedly elevated Tolerating anticoagulation, eliquis.  He is on low-dose, high fall risk  EKG shows atrial fibrillation, ventricular paced rhythm  Other past medical history In September 2016 suffered left foot fracture/ankle. This is his second ankle fracture, both from falls  Previous hospitalization for malaise, dehydration, creatinine greater than 3. Lisinopril and Lasix was discontinued.   Previous echocardiogram  showed mild to moderate pulmonary hypertension. Creatinine of 2.   History of positive sleep study, was on CPAP for several months. Recent change to his insurance now has no coverage  Previous lab work from 10/23/2012 shows total cholesterol 212, LDL 142, HDL 50, creatinine 1.7, BUN 22 He is status post right knee replacement.    PMH:   has a past medical history of AAA (abdominal aortic aneurysm) ([place]); Broken ankle; Cancer ([place]); Chronic atrial fibrillation ([place]); Chronic diastolic CHF (congestive heart failure) ([place]); Chronic obstructive pulmonary disease (HCC); CKD (chronic kidney disease), stage III; Coronary artery disease; GERD (gastroesophageal reflux disease); Hyperlipidemia; Hypertension; PVD (peripheral vascular disease) ([place]); Sleep apnea; and Tachy-brady syndrome ([place]).  PSH:    Past Surgical History:  Procedure Laterality Date  . ABDOMINAL AORTIC ANEURYSM REPAIR    . ANKLE SURGERY Right   . CORONARY ARTERY BYPASS GRAFT    . FOOT SURGERY     left foot surgery to remove cyst.  . INSERT / REPLACE / REMOVE PACEMAKER    .  ORIF ANKLE FRACTURE Left 01/08/2015   Procedure: OPEN REDUCTION INTERNAL FIXATION (ORIF) ANKLE FRACTURE;  Surgeon: Earnestine Leys, MD;  Location: [facility] ORS;  Service: Orthopedics;  Laterality: Left;  . skin cancer biopsy     nose  . TRIGGER FINGER RELEASE       Current Outpatient Prescriptions  Medication Sig Dispense Refill  . acetaminophen (TYLENOL) 325 MG tablet Take 650 mg by mouth every 6 (six) hours as needed.    [name] apixaban (ELIQUIS) 2.5 MG TABS tablet Take 1 tablet (2.5 mg total) by mouth 2 (two) times daily. 60 tablet 6  . diltiazem (CARDIZEM CD) 120 MG 24 hr capsule Take 1 capsule (120 mg total) by mouth daily. (Patient taking differently: Take 120 mg by mouth daily with lunch. ) 30 capsule 3  . donepezil (ARICEPT) 5 MG tablet Take 5 mg by mouth at bedtime.    [name] guaiFENesin (MUCINEX) 600 MG 12 hr tablet Take 1 tablet (600 mg total) by mouth 2 (two) times daily as needed for cough or to loosen phlegm. 30 tablet 0  . metoprolol succinate (TOPROL-XL) 100 MG 24 hr tablet Take 1 tablet (100 mg total) by mouth daily. (Patient taking differently: Take 100 mg by mouth daily with lunch. ) 30 tablet 3  . nitroGLYCERIN (NITROSTAT) 0.4 MG SL tablet Place 1 tablet (0.4 mg total) under the tongue every 5 (five) minutes as needed for chest pain. 25 tablet 3  . omeprazole (PRILOSEC) 20 MG capsule Take 20 mg by mouth daily with lunch.     . simvastatin (ZOCOR) 20 MG tablet Take 1 tablet (20 mg total) by mouth at bedtime. 30 tablet 6  . Tamsulosin HCl (FLOMAX) 0.4 MG CAPS Take 0.4 mg by mouth daily after supper.    . torsemide (DEMADEX) 20 MG tablet Take 1 tablet (20 mg total) by mouth 2 (two) times daily as needed (for weight gain). 60 tablet 6  . traMADol (ULTRAM) 50 MG tablet Take 1 tablet (50 mg total) by mouth every 6 (six) hours as needed. 20 tablet 0  . midodrine (PROAMATINE) 10 MG tablet Take 1 tablet (10 mg total) by mouth 3 (three) times daily. 90 tablet 6   No current facility-administered medications for this visit.      Allergies:   Lipitor [atorvastatin]   Social History:  The patient  reports that he quit smoking about 19 years ago. He has a 20.00 pack-year smoking history. He has never used smokeless tobacco. He reports that he does not  drink alcohol or use drugs.   Family History:   family history includes Cerebral aneurysm in his father; Coronary artery disease in his father and mother; Diabetes in his father and mother; Hypertension in his father and mother; Stomach cancer in his mother.    Review of Systems: Review of Systems  Constitutional: Negative.   Respiratory: Negative.   Cardiovascular: Negative.   Gastrointestinal: Negative.   Musculoskeletal: Positive for joint pain.       Severe left shoulder pain  Neurological: Negative.   Psychiatric/Behavioral: Negative.   All other systems reviewed and are negative.    PHYSICAL EXAM: VS:  BP (!) 144/84 (BP Location: Left Arm, Patient Position: Sitting, Cuff Size: Normal)   Pulse 86   Ht 6\' 1"  (1.854 m)   Wt 225 lb (102.1 kg)   BMI 29.69 kg/m  , BMI Body mass index is 29.69 kg/m. GEN: Well nourished, well developed, in no acute distress, obese  HEENT: normal  Neck:  no JVD, carotid bruits, or masses Cardiac: RRR; no murmurs, rubs, or gallops,no edema  Respiratory:  clear to auscultation bilaterally, normal work of breathing GI: soft, nontender, nondistended, + BS MS: no deformity or atrophy  Skin: warm and dry, no rash Neuro:  Strength and sensation are intact Psych: euthymic mood, full affect    Recent Labs: 11/24/2015: ALT 11 07/22/2016: BUN 32; Creatinine, Ser 3.16; Hemoglobin 11.5; Platelets 173; Potassium 4.4; Sodium 139    Lipid Panel Lab Results  Component Value Date   CHOL 140 08/06/2014   HDL 53 08/06/2014   LDLCALC 69 08/06/2014   TRIG 90 08/06/2014      Wt Readings from Last 3 Encounters:  08/09/16 225 lb (102.1 kg)  07/26/16 210 lb (95.3 kg)  07/22/16 225 lb (102.1 kg)       ASSESSMENT AND PLAN:  Permanent atrial fibrillation (HCC) - Plan: EKG 35-HGDJ, Basic Metabolic Panel (BMET) Paced rhythm, rate controlled Tolerating anticoagulation Periodic falls  Orthostasis Reported by Dr. Caryl Comes on his last clinic visit Patient  reports he is unable to tolerate Mestinon and requesting a medication change Recommended he try midodrine 5 mg TID Unclear if alcohol may be playing a role in his falls. Long history of alcohol abuse  Pure hypercholesterolemia Previously at goal, no recent lipid panel available  Atherosclerosis of coronary artery bypass graft of native heart without angina pectoris Currently with no symptoms of angina. No further workup at this time. Continue current medication regimen.  Chronic diastolic heart failure (HCC) continue torsemide 20 mg every other day Creatinine up to 3.2  PACEMAKER, PERMANENT Recent download discussed with him 11 months remaining on battery per download end of October 2017 He is scheduled for monthly downloads  CKD (chronic kidney disease), stage III Stable creatinine greater than 3  Alcohol abuse Long history of alcohol abuse, drinking issues not discussed with him Possibly contributing to falls  Grief Recent loss of his son Still adjusting   Total encounter time more than 25 minutes  Greater than 50% was spent in counseling and coordination of care with the patient  Disposition:   F/U  6 months   Orders Placed This Encounter  Procedures  . EKG 12-Lead     Signed, Esmond Plants, M.D., Ph.D. 08/09/2016  [place], [place]

## 2016-08-09 ENCOUNTER — Ambulatory Visit (INDEPENDENT_AMBULATORY_CARE_PROVIDER_SITE_OTHER): Payer: Medicare HMO | Admitting: Cardiovascular Disease

## 2016-08-09 ENCOUNTER — Encounter: Payer: Self-pay | Admitting: Cardiovascular Disease

## 2016-08-09 VITALS — BP 144/84 | HR 86 | Ht 73.0 in | Wt 225.0 lb

## 2016-08-09 DIAGNOSIS — F1010 Alcohol abuse, uncomplicated: Secondary | ICD-10-CM

## 2016-08-09 DIAGNOSIS — Z950 Presence of cardiac pacemaker: Secondary | ICD-10-CM | POA: Diagnosis not present

## 2016-08-09 DIAGNOSIS — N183 Chronic kidney disease, stage 3 unspecified: Secondary | ICD-10-CM

## 2016-08-09 DIAGNOSIS — I482 Chronic atrial fibrillation, unspecified: Secondary | ICD-10-CM

## 2016-08-09 DIAGNOSIS — I951 Orthostatic hypotension: Secondary | ICD-10-CM

## 2016-08-09 DIAGNOSIS — J441 Chronic obstructive pulmonary disease with (acute) exacerbation: Secondary | ICD-10-CM

## 2016-08-09 DIAGNOSIS — I495 Sick sinus syndrome: Secondary | ICD-10-CM | POA: Diagnosis not present

## 2016-08-09 DIAGNOSIS — I25708 Atherosclerosis of coronary artery bypass graft(s), unspecified, with other forms of angina pectoris: Secondary | ICD-10-CM | POA: Diagnosis not present

## 2016-08-09 DIAGNOSIS — I10 Essential (primary) hypertension: Secondary | ICD-10-CM

## 2016-08-09 DIAGNOSIS — E782 Mixed hyperlipidemia: Secondary | ICD-10-CM | POA: Diagnosis not present

## 2016-08-09 DIAGNOSIS — I5032 Chronic diastolic (congestive) heart failure: Secondary | ICD-10-CM | POA: Diagnosis not present

## 2016-08-09 MED ORDER — MIDODRINE HCL 10 MG PO TABS: 10.0000 mg | ORAL_TABLET | Freq: Three times a day (TID) | ORAL | 6 refills | Status: DC

## 2016-08-09 NOTE — Patient Instructions (Addendum)
Medication Instructions:   Hold the mestinon for now (Dr. Aquilla Hacker pill)  Start midodrine 1/2 pill in the Am, lunch and dinner Please call the office if you have dizzy spells  Labwork:  No new labs needed  Testing/Procedures:  No further testing at this time   I recommend watching educational videos on topics of interest to you at:       [email or web address]  Enter code: [email or web address]    Follow-Up: It was a pleasure seeing you in the office today. Please call us if you have new issues that need to be addressed before your next appt.  [phone]  Your physician wants you to follow-up in: 1 month.    If you need a refill on your cardiac medications before your next appointment, please call your pharmacy.

## 2016-08-10 DIAGNOSIS — D0461 Carcinoma in situ of skin of right upper limb, including shoulder: Secondary | ICD-10-CM | POA: Diagnosis not present

## 2016-08-10 DIAGNOSIS — C44622 Squamous cell carcinoma of skin of right upper limb, including shoulder: Secondary | ICD-10-CM | POA: Diagnosis not present

## 2016-08-24 ENCOUNTER — Ambulatory Visit (INDEPENDENT_AMBULATORY_CARE_PROVIDER_SITE_OTHER): Payer: Self-pay | Admitting: *Deleted

## 2016-08-24 DIAGNOSIS — I495 Sick sinus syndrome: Secondary | ICD-10-CM

## 2016-08-24 DIAGNOSIS — C44629 Squamous cell carcinoma of skin of left upper limb, including shoulder: Secondary | ICD-10-CM | POA: Diagnosis not present

## 2016-08-24 NOTE — Progress Notes (Signed)
Remote pacemaker transmission.   

## 2016-08-25 ENCOUNTER — Encounter: Payer: Self-pay | Admitting: Cardiology

## 2016-08-25 LAB — CUP PACEART REMOTE DEVICE CHECK
Battery Impedance: 5933 Ohm
Battery Remaining Longevity: 4 mo
Battery Voltage: 2.65 V
Implantable Lead Implant Date: 20100205
Implantable Lead Location: 753860
Implantable Lead Model: 5076
Implantable Pulse Generator Implant Date: 20100205
Lead Channel Impedance Value: 412 Ohm
Lead Channel Pacing Threshold Pulse Width: 0.4 ms
Lead Channel Setting Pacing Pulse Width: 0.4 ms
Lead Channel Setting Sensing Sensitivity: 5.6 mV
MDC IDC MSMT LEADCHNL RA IMPEDANCE VALUE: 0 Ohm
MDC IDC MSMT LEADCHNL RV PACING THRESHOLD AMPLITUDE: 0.75 V
MDC IDC SESS DTM: 20180515123505
MDC IDC SET LEADCHNL RV PACING AMPLITUDE: 2.5 V
MDC IDC STAT BRADY RV PERCENT PACED: 85 %

## 2016-09-06 DIAGNOSIS — H57059 Tonic pupil, unspecified eye: Secondary | ICD-10-CM | POA: Diagnosis not present

## 2016-09-06 DIAGNOSIS — H2510 Age-related nuclear cataract, unspecified eye: Secondary | ICD-10-CM | POA: Diagnosis not present

## 2016-09-06 DIAGNOSIS — I10 Essential (primary) hypertension: Secondary | ICD-10-CM | POA: Diagnosis not present

## 2016-09-06 DIAGNOSIS — E7800 Pure hypercholesterolemia, unspecified: Secondary | ICD-10-CM | POA: Diagnosis not present

## 2016-09-06 DIAGNOSIS — Z0100 Encounter for examination of eyes and vision without abnormal findings: Secondary | ICD-10-CM | POA: Diagnosis not present

## 2016-09-09 ENCOUNTER — Other Ambulatory Visit: Payer: Self-pay | Admitting: Cardiovascular Disease

## 2016-09-15 ENCOUNTER — Ambulatory Visit: Payer: Medicare HMO | Admitting: Cardiovascular Disease

## 2016-09-26 ENCOUNTER — Ambulatory Visit (INDEPENDENT_AMBULATORY_CARE_PROVIDER_SITE_OTHER): Payer: Self-pay | Admitting: *Deleted

## 2016-09-26 DIAGNOSIS — I495 Sick sinus syndrome: Secondary | ICD-10-CM

## 2016-09-26 NOTE — Progress Notes (Signed)
Remote pacemaker transmission.   

## 2016-09-27 LAB — CUP PACEART REMOTE DEVICE CHECK
Battery Impedance: 5912 Ohm
Battery Voltage: 2.65 V
Date Time Interrogation Session: 20180618130047
Implantable Lead Implant Date: 20100205
Implantable Lead Location: 753860
Implantable Lead Model: 5076
Implantable Pulse Generator Implant Date: 20100205
Lead Channel Impedance Value: 0 Ohm
Lead Channel Impedance Value: 446 Ohm
Lead Channel Pacing Threshold Pulse Width: 0.4 ms
Lead Channel Setting Pacing Pulse Width: 0.4 ms
Lead Channel Setting Sensing Sensitivity: 4 mV
MDC IDC MSMT BATTERY REMAINING LONGEVITY: 5 mo
MDC IDC MSMT LEADCHNL RV PACING THRESHOLD AMPLITUDE: 0.75 V
MDC IDC SET LEADCHNL RV PACING AMPLITUDE: 2.5 V
MDC IDC STAT BRADY RV PERCENT PACED: 82 %

## 2016-09-29 ENCOUNTER — Encounter: Payer: Self-pay | Admitting: Cardiology

## 2016-10-13 ENCOUNTER — Inpatient Hospital Stay
Admission: EM | Admit: 2016-10-13 | Discharge: 2016-10-19 | DRG: 559 | Disposition: A | Discharge status: Skilled Nursing Facility | Payer: Medicare HMO | Attending: Internal Medicine | Admitting: Internal Medicine

## 2016-10-13 DIAGNOSIS — N184 Chronic kidney disease, stage 4 (severe): Secondary | ICD-10-CM | POA: Diagnosis present

## 2016-10-13 DIAGNOSIS — W19XXXA Unspecified fall, initial encounter: Secondary | ICD-10-CM

## 2016-10-13 DIAGNOSIS — M6281 Muscle weakness (generalized): Secondary | ICD-10-CM | POA: Diagnosis not present

## 2016-10-13 DIAGNOSIS — M25552 Pain in left hip: Secondary | ICD-10-CM

## 2016-10-13 DIAGNOSIS — G9340 Encephalopathy, unspecified: Secondary | ICD-10-CM | POA: Diagnosis present

## 2016-10-13 DIAGNOSIS — I482 Chronic atrial fibrillation: Secondary | ICD-10-CM | POA: Diagnosis present

## 2016-10-13 DIAGNOSIS — Z85828 Personal history of other malignant neoplasm of skin: Secondary | ICD-10-CM

## 2016-10-13 DIAGNOSIS — I739 Peripheral vascular disease, unspecified: Secondary | ICD-10-CM | POA: Diagnosis present

## 2016-10-13 DIAGNOSIS — Z8679 Personal history of other diseases of the circulatory system: Secondary | ICD-10-CM | POA: Diagnosis not present

## 2016-10-13 DIAGNOSIS — F331 Major depressive disorder, recurrent, moderate: Secondary | ICD-10-CM | POA: Diagnosis not present

## 2016-10-13 DIAGNOSIS — D649 Anemia, unspecified: Secondary | ICD-10-CM

## 2016-10-13 DIAGNOSIS — Z8249 Family history of ischemic heart disease and other diseases of the circulatory system: Secondary | ICD-10-CM

## 2016-10-13 DIAGNOSIS — S299XXA Unspecified injury of thorax, initial encounter: Secondary | ICD-10-CM | POA: Diagnosis not present

## 2016-10-13 DIAGNOSIS — Z800 Family history of malignant neoplasm of digestive organs: Secondary | ICD-10-CM | POA: Diagnosis not present

## 2016-10-13 DIAGNOSIS — E785 Hyperlipidemia, unspecified: Secondary | ICD-10-CM | POA: Diagnosis present

## 2016-10-13 DIAGNOSIS — R2689 Other abnormalities of gait and mobility: Secondary | ICD-10-CM | POA: Diagnosis not present

## 2016-10-13 DIAGNOSIS — M25559 Pain in unspecified hip: Secondary | ICD-10-CM | POA: Diagnosis not present

## 2016-10-13 DIAGNOSIS — R278 Other lack of coordination: Secondary | ICD-10-CM | POA: Diagnosis not present

## 2016-10-13 DIAGNOSIS — M9702XA Periprosthetic fracture around internal prosthetic left hip joint, initial encounter: Secondary | ICD-10-CM | POA: Diagnosis present

## 2016-10-13 DIAGNOSIS — E875 Hyperkalemia: Secondary | ICD-10-CM | POA: Diagnosis not present

## 2016-10-13 DIAGNOSIS — I2510 Atherosclerotic heart disease of native coronary artery without angina pectoris: Secondary | ICD-10-CM | POA: Diagnosis present

## 2016-10-13 DIAGNOSIS — N179 Acute kidney failure, unspecified: Secondary | ICD-10-CM | POA: Diagnosis not present

## 2016-10-13 DIAGNOSIS — Z87891 Personal history of nicotine dependence: Secondary | ICD-10-CM

## 2016-10-13 DIAGNOSIS — Z950 Presence of cardiac pacemaker: Secondary | ICD-10-CM

## 2016-10-13 DIAGNOSIS — K219 Gastro-esophageal reflux disease without esophagitis: Secondary | ICD-10-CM | POA: Diagnosis present

## 2016-10-13 DIAGNOSIS — Y92000 Kitchen of unspecified non-institutional (private) residence as  the place of occurrence of the external cause: Secondary | ICD-10-CM

## 2016-10-13 DIAGNOSIS — Z9181 History of falling: Secondary | ICD-10-CM | POA: Diagnosis not present

## 2016-10-13 DIAGNOSIS — Z833 Family history of diabetes mellitus: Secondary | ICD-10-CM

## 2016-10-13 DIAGNOSIS — W010XXA Fall on same level from slipping, tripping and stumbling without subsequent striking against object, initial encounter: Secondary | ICD-10-CM | POA: Diagnosis present

## 2016-10-13 DIAGNOSIS — N400 Enlarged prostate without lower urinary tract symptoms: Secondary | ICD-10-CM | POA: Diagnosis present

## 2016-10-13 DIAGNOSIS — S72002A Fracture of unspecified part of neck of left femur, initial encounter for closed fracture: Secondary | ICD-10-CM

## 2016-10-13 DIAGNOSIS — I130 Hypertensive heart and chronic kidney disease with heart failure and stage 1 through stage 4 chronic kidney disease, or unspecified chronic kidney disease: Secondary | ICD-10-CM | POA: Diagnosis present

## 2016-10-13 DIAGNOSIS — I252 Old myocardial infarction: Secondary | ICD-10-CM

## 2016-10-13 DIAGNOSIS — S72012D Unspecified intracapsular fracture of left femur, subsequent encounter for closed fracture with routine healing: Secondary | ICD-10-CM | POA: Diagnosis not present

## 2016-10-13 DIAGNOSIS — D631 Anemia in chronic kidney disease: Secondary | ICD-10-CM | POA: Diagnosis not present

## 2016-10-13 DIAGNOSIS — Z7401 Bed confinement status: Secondary | ICD-10-CM | POA: Diagnosis not present

## 2016-10-13 DIAGNOSIS — Z4789 Encounter for other orthopedic aftercare: Secondary | ICD-10-CM | POA: Diagnosis not present

## 2016-10-13 DIAGNOSIS — Z7901 Long term (current) use of anticoagulants: Secondary | ICD-10-CM

## 2016-10-13 DIAGNOSIS — J9602 Acute respiratory failure with hypercapnia: Secondary | ICD-10-CM | POA: Diagnosis not present

## 2016-10-13 DIAGNOSIS — Z888 Allergy status to other drugs, medicaments and biological substances status: Secondary | ICD-10-CM

## 2016-10-13 DIAGNOSIS — Z951 Presence of aortocoronary bypass graft: Secondary | ICD-10-CM

## 2016-10-13 DIAGNOSIS — E872 Acidosis: Secondary | ICD-10-CM | POA: Diagnosis present

## 2016-10-13 DIAGNOSIS — I5032 Chronic diastolic (congestive) heart failure: Secondary | ICD-10-CM | POA: Diagnosis present

## 2016-10-13 DIAGNOSIS — F1010 Alcohol abuse, uncomplicated: Secondary | ICD-10-CM | POA: Diagnosis not present

## 2016-10-13 DIAGNOSIS — J449 Chronic obstructive pulmonary disease, unspecified: Secondary | ICD-10-CM | POA: Diagnosis present

## 2016-10-13 DIAGNOSIS — J9601 Acute respiratory failure with hypoxia: Secondary | ICD-10-CM | POA: Diagnosis not present

## 2016-10-13 DIAGNOSIS — Z66 Do not resuscitate: Secondary | ICD-10-CM | POA: Diagnosis not present

## 2016-10-13 DIAGNOSIS — S72009A Fracture of unspecified part of neck of unspecified femur, initial encounter for closed fracture: Secondary | ICD-10-CM | POA: Diagnosis not present

## 2016-10-13 DIAGNOSIS — R488 Other symbolic dysfunctions: Secondary | ICD-10-CM | POA: Diagnosis not present

## 2016-10-13 DIAGNOSIS — S79912A Unspecified injury of left hip, initial encounter: Secondary | ICD-10-CM | POA: Diagnosis not present

## 2016-10-13 HISTORY — DX: Major depressive disorder, single episode, unspecified: F32.9

## 2016-10-13 HISTORY — DX: Depression, unspecified: F32.A

## 2016-10-13 HISTORY — DX: Personal history of other mental and behavioral disorders: Z86.59

## 2016-10-13 NOTE — ED Provider Notes (Signed)
[facility] Emergency Department Provider Note   First MD Initiated Contact with Patient 10/13/16 2358     (approximate)  I have reviewed the triage vital signs and the nursing notes.   HISTORY  Chief Complaint Hip Pain and Leg Pain   HPI [name] is a 83 y.o. male with below list of chronic medical conditions presents to the emergency department status post accidental fall with complaining of 9 out of 10 left hip pain worse with any movement. Patient states that he has difficulty with ambulation and had an accidental fall tonight in his kitchen and has been on the floor since 7:30 PM unable to get up and a such as wife gave him a pill on a blanket and he has been laying on the floor since that time. Patient does admit to EtOH ingestion tonight. Patient denies any head injury during the fall denies any loss of consciousness. Patient states that he's had a "RIGHT" hip replacement. I asked the patient multiple times and he stated that it was his right hip and pointed to it. Chart reviewed revealed that the patient's had a left hip replacement  Past Medical History:  Diagnosis Date  . AAA (abdominal aortic aneurysm) ([place])    a. s/p repair - 1999 @ [facility] @ time of CABG.  . Broken ankle   . Cancer ([place])    skin cancer nose  . Chronic atrial fibrillation (HCC)    a. on pradaxa  . Chronic diastolic CHF (congestive heart failure) ([place])    a. 01/2012 Echo:  EF 55%.  . Chronic obstructive pulmonary disease ([place])   . CKD (chronic kidney disease), stage III    a. Acute on chronic 03/2012  . Coronary artery disease    a. s/p MI x 3;  b. s/p CABG x2 in 1999;  c. Cath 2007: LIMA->LAD patent, VG->OM 100, PCI/DES of RPL/mid RCA (cypher DES).  . GERD (gastroesophageal reflux disease)   . Hyperlipidemia   . Hypertension   . PVD (peripheral vascular disease) ([place])   . Sleep apnea   . Tachy-brady syndrome ([place])    a. s/p PPM 2010.    Patient Active Problem List   Diagnosis Date Noted  . Hip fracture ([place]) 10/14/2016  . Syncope 11/24/2015  . Depression, major, single episode, severe ([place]) 08/18/2015  . Alcohol abuse 08/18/2015  . Suicidal ideation 08/18/2015  . Bimalleolar fracture of left ankle 01/07/2015  . COPD exacerbation ([place]) 08/22/2014  . Coronary artery disease   . Chronic atrial fibrillation ([place])   . Chronic diastolic CHF (congestive heart failure) ([place])   . Tachy-brady syndrome ([place])   . CKD (chronic kidney disease), stage III 04/09/2012  . Chronic diastolic heart failure ([place]) 01/26/2012  . Chest pain 09/23/2010  . Hyperlipidemia 06/12/2009  . Essential hypertension 06/12/2009  . Coronary atherosclerosis of artery bypass graft 06/12/2009  . ATRIAL FIBRILLATION 06/12/2009  . AAA 06/12/2009  . COPD 06/12/2009  . GERD 06/12/2009  . PACEMAKER, PERMANENT 06/12/2009    Past Surgical History:  Procedure Laterality Date  . ABDOMINAL AORTIC ANEURYSM REPAIR    . ANKLE SURGERY Right   . CORONARY ARTERY BYPASS GRAFT    . FOOT SURGERY     left foot surgery to remove cyst.  . INSERT / REPLACE / REMOVE PACEMAKER    . ORIF ANKLE FRACTURE Left 01/08/2015   Procedure: OPEN REDUCTION INTERNAL FIXATION (ORIF) ANKLE FRACTURE;  Surgeon: Earnestine Leys, MD;  Location: [facility] ORS;  Service: Orthopedics;  Laterality: Left;  . skin cancer biopsy     nose  . TRIGGER FINGER RELEASE      Prior to Admission medications   Medication Sig Start Date End Date Taking? Authorizing Provider  acetaminophen (TYLENOL) 325 MG tablet Take 650 mg by mouth every 6 (six) hours as needed.    [provider]  diltiazem (CARDIZEM CD) 120 MG 24 hr capsule Take 1 capsule (120 mg total) by mouth daily. Patient taking differently: Take 120 mg by mouth daily with lunch.  07/15/14   Deboraha Sprang, MD  donepezil (ARICEPT) 5 MG tablet Take 5 mg by mouth at bedtime.    [provider]  ELIQUIS 2.5 MG TABS tablet TAKE ONE TABLET BY MOUTH TWICE DAILY 09/09/16    Minna Merritts, MD  guaiFENesin (MUCINEX) 600 MG 12 hr tablet Take 1 tablet (600 mg total) by mouth 2 (two) times daily as needed for cough or to loosen phlegm. 07/22/16   Harvest Dark, MD  metoprolol succinate (TOPROL-XL) 100 MG 24 hr tablet Take 1 tablet (100 mg total) by mouth daily. Patient taking differently: Take 100 mg by mouth daily with lunch.  06/03/13   Deboraha Sprang, MD  midodrine (PROAMATINE) 10 MG tablet Take 1 tablet (10 mg total) by mouth 3 (three) times daily. 08/09/16   Minna Merritts, MD  nitroGLYCERIN (NITROSTAT) 0.4 MG SL tablet Place 1 tablet (0.4 mg total) under the tongue every 5 (five) minutes as needed for chest pain. 10/06/15   Minna Merritts, MD  omeprazole (PRILOSEC) 20 MG capsule Take 20 mg by mouth daily with lunch.     [provider]  simvastatin (ZOCOR) 20 MG tablet Take 1 tablet (20 mg total) by mouth at bedtime. 11/13/13   Minna Merritts, MD  Tamsulosin HCl (FLOMAX) 0.4 MG CAPS Take 0.4 mg by mouth daily after supper.    [provider]  torsemide (DEMADEX) 20 MG tablet Take 1 tablet (20 mg total) by mouth 2 (two) times daily as needed (for weight gain). 03/22/16   Minna Merritts, MD  traMADol (ULTRAM) 50 MG tablet Take 1 tablet (50 mg total) by mouth every 6 (six) hours as needed. 07/22/16 07/22/17  Harvest Dark, MD    Allergies Lipitor [atorvastatin]  Family History  Problem Relation Age of Onset  . Stomach cancer Mother        died @ 72  . Diabetes Mother   . Hypertension Mother   . Coronary artery disease Mother   . Cerebral aneurysm Father        died @ 40  . Diabetes Father   . Hypertension Father   . Coronary artery disease Father     Social History Social History  Substance Use Topics  . Smoking status: Former Smoker    Packs/day: 1.00    Years: 20.00    Quit date: 04/11/1997  . Smokeless tobacco: Never Used  . Alcohol use No     Comment: 1 bottle of bourbon a week     Review of  Systems Constitutional: No fever/chills Eyes: No visual changes. ENT: No sore throat. Cardiovascular: Denies chest pain. Respiratory: Denies shortness of breath. Gastrointestinal: No abdominal pain.  No nausea, no vomiting.  No diarrhea.  No constipation. Genitourinary: Negative for dysuria. Musculoskeletal: Negative for neck pain.  Negative for back pain.Positive for left hip pain Integumentary: Negative for rash. Neurological: Negative for headaches, focal weakness or numbness.   ____________________________________________  PHYSICAL EXAM:  VITAL SIGNS: ED Triage Vitals  Enc Vitals Group     BP 10/13/16 2340 128/75     Pulse Rate 10/13/16 2340 70     Resp 10/13/16 2340 18     Temp 10/13/16 2340 (!) 97.4 F (36.3 C)     Temp Source 10/13/16 2340 Oral     SpO2 10/13/16 2340 91 %     Weight 10/13/16 2342 97.5 kg (215 lb)     Height 10/13/16 2342 1.854 m (6\' 1" )     Head Circumference --      Peak Flow --      Pain Score 10/13/16 2339 6     Pain Loc --      Pain Edu? --      Excl. in [place]? --     Constitutional: Alert and oriented. Well appearing and in no acute distress. Eyes: Conjunctivae are normal. PERRL. EOMI. Head: Atraumatic. Mouth/Throat: Mucous membranes are moist.  Oropharynx non-erythematous. Neck: No stridor.  Cardiovascular: Normal rate, regular rhythm. Good peripheral circulation. Grossly normal heart sounds. Respiratory: Normal respiratory effort.  No retractions. Lungs CTAB. Gastrointestinal: Soft and nontender. No distention.  Musculoskeletal: Left hip pain with palpation, pain with passive and active range of motion.  Neurologic:  Normal speech and language. No gross focal neurologic deficits are appreciated.  Skin:  Skin is warm, dry and intact. No rash noted. Psychiatric: Mood and affect are normal. Speech and behavior are normal.  ____________________________________________   LABS (all labs ordered are listed, but only abnormal results are  displayed)  Labs Reviewed  BASIC METABOLIC PANEL - Abnormal; Notable for the following:       Result Value   BUN 46 (*)    Creatinine, Ser 4.34 (*)    Calcium 8.4 (*)    GFR calc non Af Amer 12 (*)    GFR calc Af Amer 13 (*)    All other components within normal limits  CBC - Abnormal; Notable for the following:    RBC 3.21 (*)    Hemoglobin 9.7 (*)    HCT 29.5 (*)    RDW 16.8 (*)    All other components within normal limits  TROPONIN I - Abnormal; Notable for the following:    Troponin I 0.03 (*)    All other components within normal limits  URINALYSIS, COMPLETE (UACMP) WITH MICROSCOPIC - Abnormal; Notable for the following:    Color, Urine STRAW (*)    APPearance CLEAR (*)    Hgb urine dipstick SMALL (*)    Protein, ur 30 (*)    Squamous Epithelial / LPF 0-5 (*)    All other components within normal limits  ETHANOL - Abnormal; Notable for the following:    Alcohol, Ethyl (B) 84 (*)    All other components within normal limits    RADIOLOGY I, [place] BROWN, personally viewed and evaluated these images (plain radiographs) as part of my medical decision making, as well as reviewing the written report by the radiologist.  Ct Hip Left Wo Contrast  Result Date: 10/14/2016 CLINICAL DATA:  Left hip pain after fall. EXAM: CT OF THE LEFT HIP WITHOUT CONTRAST TECHNIQUE: Multidetector CT imaging of the left hip was performed according to the standard protocol. Multiplanar CT image reconstructions were also generated. COMPARISON:  Radiographs earlier this day. FINDINGS: Bones/Joint/Cartilage Left hip arthroplasty with cerclage wire fixation. Acute nondisplaced fracture of the subtrochanteric femur just below the intertrochanteric region. Fracture may abut the femoral stem. There  is heterotopic calcification about the greater trochanter superiorly. No additional fracture of the left hip or pelvis. Pubic rami are intact. No visualize sacral fracture. Left sacroiliac joint and pubic  symphysis are congruent were visualized. Ligaments Suboptimally assessed by CT. Muscles and Tendons No evidence of intramuscular hematoma. Soft tissues Soft tissue density adjacent is cerclage wire extending to skin may be postsurgical change or hematoma. Fat within both inguinal canals. Colonic diverticulosis is incidentally noted. Atherosclerotic calcifications of pelvic vasculature. IMPRESSION: Left hip arthroplasty with acute minimally displaced periprosthetic fracture in the subtrochanteric region. Electronically Signed   By: Jeb Levering M.D.   On: 10/14/2016 02:33   Dg Chest Port 1 View  Result Date: 10/14/2016 CLINICAL DATA:  Fall EXAM: PORTABLE CHEST 1 VIEW COMPARISON:  Chest radiograph 07/22/2016 FINDINGS: Cardiomediastinal silhouette remains enlarged. Single lead left chest wall pacemaker is unchanged. There are findings of prior CABG. Calcified granuloma at the left lung base. No focal consolidation or pulmonary edema. IMPRESSION: No active disease. Electronically Signed   By: Ulyses Jarred M.D.   On: 10/14/2016 00:37   Dg Hip Unilat With Pelvis 2-3 Views Left  Result Date: 10/14/2016 CLINICAL DATA:  Left hip pain after fall from standing in the kitchen. EXAM: DG HIP (WITH OR WITHOUT PELVIS) 2-3V LEFT COMPARISON:  Left hip radiograph 02/19/2007 FINDINGS: Left hip arthroplasty in expected alignment with cerclage wire. Mild heterotopic calcification adjacent to the greater trochanter. Cortical irregularity laterally about the intertrochanteric region is unchanged from remote prior exam. No evidence of acute or periprosthetic fracture. No periprosthetic lucency. Pubic rami are intact. Pubic symphysis and sacroiliac joints are congruent. IMPRESSION: Left hip arthroplasty in place without evidence of acute or periprosthetic fracture. Mild heterotopic calcification adjacent to the greater trochanter. Electronically Signed   By: Jeb Levering M.D.   On: 10/14/2016 00:37      Procedures   ____________________________________________   INITIAL IMPRESSION / ASSESSMENT AND PLAN / ED COURSE  Pertinent labs & imaging results that were available during my care of the patient were reviewed by me and considered in my medical decision making (see chart for details).   Patient presenting to the emergency department history of accidental fall with left hip pain. Concern for possible hip fracture and a such x-ray performed which did not reveal a fracture per radiology however given local suspicion CT scan performed which revealed a periprosthetic fracture. Patient discussed with Dr. Marry Guan orthopedic surgeon on call for hospital admission. Patient discussed with Dr. Estanislado Pandy hospitalist on-call who will admit the patient     ____________________________________________  FINAL CLINICAL IMPRESSION(S) / ED DIAGNOSES  Final diagnoses:  Fall  Left hip pain  Closed fracture of left hip, initial encounter ([place])  Anemia, unspecified type     MEDICATIONS GIVEN DURING THIS VISIT:  Medications  morphine 2 MG/ML injection 2 mg (2 mg Intravenous Given 10/14/16 0123)  ondansetron (ZOFRAN) injection 4 mg (4 mg Intravenous Given 10/14/16 0122)     NEW OUTPATIENT MEDICATIONS STARTED DURING THIS VISIT:  New Prescriptions   No medications on file    Modified Medications   No medications on file    Discontinued Medications   No medications on file     Note:  This document was prepared using Dragon voice recognition software and may include unintentional dictation errors.    Gregor Hams, MD 10/14/16 [phone]

## 2016-10-13 NOTE — Progress Notes (Deleted)
Cardiology Office Note  Date:  10/13/2016   ID:  [name], DOB 08-Mar-1934, MRN [id number]  PCP:  [place] Pink, MD   No chief complaint on file.   HPI:  83 year old gentleman with a history of  recurrent falls and injury from alcohol abuse, coronary artery disease,  bypass surgery in 1999 with catheterization in 2007  patent LIMA to the LAD, occluded vein graft to the OM,  Cypher stent to the PL branch and mid RCA in September 07, ejection fraction 45-50%,  Chronic diastolic and systolic CHF chronic atrial fibrillation with [employer] pacemaker,   Pacemaker in 2010 for syncope, AFib with pauses.  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 Chronic renal insufficiency He presents for routine follow-up of his coronary artery disease and atrial fibrillation  Recent stressors at home son died from metastatic rectal cancer  Recent falls He was in the ER evaluation 07/22/16 for fall Hospital records reviewed with the patient in detail  Recently seen by Dr. Caryl Comes, noted to have orthostasis Started on Mestinon 60 mg twice a day Reports that he is not tolerating the medication well, he is having Side effects, stomach issues Diarrhea, "cant hold it", having accidents Reports he was not having this side effects before starting medication. Requesting change of medication  Reports stable breathing, no leg edema, stable weight Takes Torsemide every other day Currently not on potassium  Alcohol intake not discussed with him today  EKG personally reviewed by myself on todays visit Sugars paced rhythm rate 86 bpm   Other past medical history reviewed Previous evaluation in the hospital for suicidal ideation in the setting of his son being ill with cancer  Torsemide decreased down to 20 mg every other day as creatinine was greater than 3, well above his baseline  Previously he reports he was taking torsemide only as needed  Continues to drink  alcohol, recent alcohol level in the hospital markedly elevated Tolerating anticoagulation, eliquis.  He is on low-dose, high fall risk  EKG shows atrial fibrillation, ventricular paced rhythm  Other past medical history In September 2016 suffered left foot fracture/ankle. This is his second ankle fracture, both from falls  Previous hospitalization for malaise, dehydration, creatinine greater than 3. Lisinopril and Lasix was discontinued.   Previous echocardiogram  showed mild to moderate pulmonary hypertension. Creatinine of 2.   History of positive sleep study, was on CPAP for several months. Recent change to his insurance now has no coverage  Previous lab work from 10/23/2012 shows total cholesterol 212, LDL 142, HDL 50, creatinine 1.7, BUN 22 He is status post right knee replacement.    PMH:   has a past medical history of AAA (abdominal aortic aneurysm) ([place]); Broken ankle; Cancer ([place]); Chronic atrial fibrillation ([place]); Chronic diastolic CHF (congestive heart failure) ([place]); Chronic obstructive pulmonary disease (HCC); CKD (chronic kidney disease), stage III; Coronary artery disease; GERD (gastroesophageal reflux disease); Hyperlipidemia; Hypertension; PVD (peripheral vascular disease) ([place]); Sleep apnea; and Tachy-brady syndrome ([place]).  PSH:    Past Surgical History:  Procedure Laterality Date  . ABDOMINAL AORTIC ANEURYSM REPAIR    . ANKLE SURGERY Right   . CORONARY ARTERY BYPASS GRAFT    . FOOT SURGERY     left foot surgery to remove cyst.  . INSERT / REPLACE / REMOVE PACEMAKER    . ORIF ANKLE FRACTURE Left 01/08/2015   Procedure: OPEN REDUCTION INTERNAL FIXATION (ORIF) ANKLE FRACTURE;  Surgeon: Earnestine Leys, MD;  Location: [facility] ORS;  Service: Orthopedics;  Laterality: Left;  . skin cancer biopsy     nose  . TRIGGER FINGER RELEASE      Current Outpatient Prescriptions  Medication Sig Dispense Refill  . acetaminophen (TYLENOL) 325 MG tablet Take 650 mg by mouth  every 6 (six) hours as needed.    . diltiazem (CARDIZEM CD) 120 MG 24 hr capsule Take 1 capsule (120 mg total) by mouth daily. (Patient taking differently: Take 120 mg by mouth daily with lunch. ) 30 capsule 3  . donepezil (ARICEPT) 5 MG tablet Take 5 mg by mouth at bedtime.    [name] ELIQUIS 2.5 MG TABS tablet TAKE ONE TABLET BY MOUTH TWICE DAILY 60 tablet 3  . guaiFENesin (MUCINEX) 600 MG 12 hr tablet Take 1 tablet (600 mg total) by mouth 2 (two) times daily as needed for cough or to loosen phlegm. 30 tablet 0  . metoprolol succinate (TOPROL-XL) 100 MG 24 hr tablet Take 1 tablet (100 mg total) by mouth daily. (Patient taking differently: Take 100 mg by mouth daily with lunch. ) 30 tablet 3  . midodrine (PROAMATINE) 10 MG tablet Take 1 tablet (10 mg total) by mouth 3 (three) times daily. 90 tablet 6  . nitroGLYCERIN (NITROSTAT) 0.4 MG SL tablet Place 1 tablet (0.4 mg total) under the tongue every 5 (five) minutes as needed for chest pain. 25 tablet 3  . omeprazole (PRILOSEC) 20 MG capsule Take 20 mg by mouth daily with lunch.     . simvastatin (ZOCOR) 20 MG tablet Take 1 tablet (20 mg total) by mouth at bedtime. 30 tablet 6  . Tamsulosin HCl (FLOMAX) 0.4 MG CAPS Take 0.4 mg by mouth daily after supper.    . torsemide (DEMADEX) 20 MG tablet Take 1 tablet (20 mg total) by mouth 2 (two) times daily as needed (for weight gain). 60 tablet 6  . traMADol (ULTRAM) 50 MG tablet Take 1 tablet (50 mg total) by mouth every 6 (six) hours as needed. 20 tablet 0   No current facility-administered medications for this visit.      Allergies:   Lipitor [atorvastatin]   Social History:  The patient  reports that he quit smoking about 19 years ago. He has a 20.00 pack-year smoking history. He has never used smokeless tobacco. He reports that he does not drink alcohol or use drugs.   Family History:   family history includes Cerebral aneurysm in his father; Coronary artery disease in his father and mother; Diabetes in  his father and mother; Hypertension in his father and mother; Stomach cancer in his mother.    Review of Systems: Review of Systems  Constitutional: Negative.   Respiratory: Negative.   Cardiovascular: Negative.   Gastrointestinal: Negative.   Musculoskeletal: Positive for joint pain.       Severe left shoulder pain  Neurological: Negative.   Psychiatric/Behavioral: Negative.   All other systems reviewed and are negative.    PHYSICAL EXAM: VS:  There were no vitals taken for this visit. , BMI There is no height or weight on file to calculate BMI. GEN: Well nourished, well developed, in no acute distress, obese  HEENT: normal  Neck: no JVD, carotid bruits, or masses Cardiac: RRR; no murmurs, rubs, or gallops,no edema  Respiratory:  clear to auscultation bilaterally, normal work of breathing GI: soft, nontender, nondistended, + BS MS: no deformity or atrophy  Skin: warm and dry, no rash Neuro:  Strength and sensation are intact Psych: euthymic mood, full affect  Recent Labs: 11/24/2015: ALT 11 07/22/2016: BUN 32; Creatinine, Ser 3.16; Hemoglobin 11.5; Platelets 173; Potassium 4.4; Sodium 139    Lipid Panel Lab Results  Component Value Date   CHOL 140 08/06/2014   HDL 53 08/06/2014   LDLCALC 69 08/06/2014   TRIG 90 08/06/2014      Wt Readings from Last 3 Encounters:  08/09/16 225 lb (102.1 kg)  07/26/16 210 lb (95.3 kg)  07/22/16 225 lb (102.1 kg)       ASSESSMENT AND PLAN:  Permanent atrial fibrillation (HCC) - Plan: EKG 65-LDJT, Basic Metabolic Panel (BMET) Paced rhythm, rate controlled Tolerating anticoagulation Periodic falls  Orthostasis Reported by Dr. Caryl Comes on his last clinic visit Patient reports he is unable to tolerate Mestinon and requesting a medication change Recommended he try midodrine 5 mg TID Unclear if alcohol may be playing a role in his falls. Long history of alcohol abuse  Pure hypercholesterolemia Previously at goal, no recent  lipid panel available  Atherosclerosis of coronary artery bypass graft of native heart without angina pectoris Currently with no symptoms of angina. No further workup at this time. Continue current medication regimen.  Chronic diastolic heart failure (HCC) continue torsemide 20 mg every other day Creatinine up to 3.2  PACEMAKER, PERMANENT Recent download discussed with him 11 months remaining on battery per download end of October 2017 He is scheduled for monthly downloads  CKD (chronic kidney disease), stage III Stable creatinine greater than 3  Alcohol abuse Long history of alcohol abuse, drinking issues not discussed with him Possibly contributing to falls  Grief Recent loss of his son Still adjusting   Total encounter time more than 25 minutes  Greater than 50% was spent in counseling and coordination of care with the patient  Disposition:   F/U  6 months   No orders of the defined types were placed in this encounter.    Signed, Esmond Plants, M.D., Ph.D. 10/13/2016  [place], [place]

## 2016-10-13 NOTE — ED Triage Notes (Signed)
Pt arrived from home via EMS. Fall from standing in the kitchen. Pt states he has been laying there since 7pm tonight til EMS got there around 11:30pm. He fell on his Left hip and Left leg. EMS stated he has had surgery in the past on both ankles and CABAG x 2. No shortening or rotation per EMS. VS per EMS BP-142/90. Wife reports to EMS that he has "been drinking quite a bit". Pt states that he has not been drinking much at all.

## 2016-10-14 ENCOUNTER — Emergency Department: Payer: Medicare HMO

## 2016-10-14 ENCOUNTER — Encounter: Payer: Self-pay | Admitting: Internal Medicine

## 2016-10-14 ENCOUNTER — Ambulatory Visit: Payer: Medicare HMO | Admitting: Cardiovascular Disease

## 2016-10-14 DIAGNOSIS — I482 Chronic atrial fibrillation: Secondary | ICD-10-CM | POA: Diagnosis present

## 2016-10-14 DIAGNOSIS — Z800 Family history of malignant neoplasm of digestive organs: Secondary | ICD-10-CM | POA: Diagnosis not present

## 2016-10-14 DIAGNOSIS — N184 Chronic kidney disease, stage 4 (severe): Secondary | ICD-10-CM | POA: Diagnosis present

## 2016-10-14 DIAGNOSIS — E872 Acidosis: Secondary | ICD-10-CM | POA: Diagnosis present

## 2016-10-14 DIAGNOSIS — Z8249 Family history of ischemic heart disease and other diseases of the circulatory system: Secondary | ICD-10-CM | POA: Diagnosis not present

## 2016-10-14 DIAGNOSIS — Z950 Presence of cardiac pacemaker: Secondary | ICD-10-CM | POA: Diagnosis not present

## 2016-10-14 DIAGNOSIS — F331 Major depressive disorder, recurrent, moderate: Secondary | ICD-10-CM | POA: Diagnosis present

## 2016-10-14 DIAGNOSIS — Z8679 Personal history of other diseases of the circulatory system: Secondary | ICD-10-CM | POA: Diagnosis not present

## 2016-10-14 DIAGNOSIS — I2510 Atherosclerotic heart disease of native coronary artery without angina pectoris: Secondary | ICD-10-CM | POA: Diagnosis present

## 2016-10-14 DIAGNOSIS — Z85828 Personal history of other malignant neoplasm of skin: Secondary | ICD-10-CM | POA: Diagnosis not present

## 2016-10-14 DIAGNOSIS — Z7901 Long term (current) use of anticoagulants: Secondary | ICD-10-CM | POA: Diagnosis not present

## 2016-10-14 DIAGNOSIS — J449 Chronic obstructive pulmonary disease, unspecified: Secondary | ICD-10-CM | POA: Diagnosis present

## 2016-10-14 DIAGNOSIS — Z951 Presence of aortocoronary bypass graft: Secondary | ICD-10-CM | POA: Diagnosis not present

## 2016-10-14 DIAGNOSIS — G9340 Encephalopathy, unspecified: Secondary | ICD-10-CM | POA: Diagnosis present

## 2016-10-14 DIAGNOSIS — Y92000 Kitchen of unspecified non-institutional (private) residence as  the place of occurrence of the external cause: Secondary | ICD-10-CM | POA: Diagnosis not present

## 2016-10-14 DIAGNOSIS — M9702XA Periprosthetic fracture around internal prosthetic left hip joint, initial encounter: Secondary | ICD-10-CM | POA: Diagnosis present

## 2016-10-14 DIAGNOSIS — S72009A Fracture of unspecified part of neck of unspecified femur, initial encounter for closed fracture: Secondary | ICD-10-CM | POA: Diagnosis present

## 2016-10-14 DIAGNOSIS — I739 Peripheral vascular disease, unspecified: Secondary | ICD-10-CM | POA: Diagnosis present

## 2016-10-14 DIAGNOSIS — F1010 Alcohol abuse, uncomplicated: Secondary | ICD-10-CM | POA: Diagnosis not present

## 2016-10-14 DIAGNOSIS — I252 Old myocardial infarction: Secondary | ICD-10-CM | POA: Diagnosis not present

## 2016-10-14 DIAGNOSIS — J9602 Acute respiratory failure with hypercapnia: Secondary | ICD-10-CM | POA: Diagnosis not present

## 2016-10-14 DIAGNOSIS — E785 Hyperlipidemia, unspecified: Secondary | ICD-10-CM | POA: Diagnosis present

## 2016-10-14 DIAGNOSIS — I130 Hypertensive heart and chronic kidney disease with heart failure and stage 1 through stage 4 chronic kidney disease, or unspecified chronic kidney disease: Secondary | ICD-10-CM | POA: Diagnosis present

## 2016-10-14 DIAGNOSIS — W010XXA Fall on same level from slipping, tripping and stumbling without subsequent striking against object, initial encounter: Secondary | ICD-10-CM | POA: Diagnosis present

## 2016-10-14 DIAGNOSIS — N179 Acute kidney failure, unspecified: Secondary | ICD-10-CM | POA: Diagnosis not present

## 2016-10-14 DIAGNOSIS — K219 Gastro-esophageal reflux disease without esophagitis: Secondary | ICD-10-CM | POA: Diagnosis present

## 2016-10-14 DIAGNOSIS — Z833 Family history of diabetes mellitus: Secondary | ICD-10-CM | POA: Diagnosis not present

## 2016-10-14 DIAGNOSIS — M25552 Pain in left hip: Secondary | ICD-10-CM | POA: Diagnosis present

## 2016-10-14 DIAGNOSIS — I5032 Chronic diastolic (congestive) heart failure: Secondary | ICD-10-CM | POA: Diagnosis present

## 2016-10-14 LAB — BASIC METABOLIC PANEL
ANION GAP: 9 (ref 5–15)
Anion gap: 9 (ref 5–15)
BUN: 42 mg/dL — ABNORMAL HIGH (ref 6–20)
BUN: 46 mg/dL — AB (ref 6–20)
CHLORIDE: 108 mmol/L (ref 101–111)
CO2: 22 mmol/L (ref 22–32)
CO2: 23 mmol/L (ref 22–32)
CREATININE: 4.01 mg/dL — AB (ref 0.61–1.24)
Calcium: 8.4 mg/dL — ABNORMAL LOW (ref 8.9–10.3)
Calcium: 8.4 mg/dL — ABNORMAL LOW (ref 8.9–10.3)
Chloride: 105 mmol/L (ref 101–111)
Creatinine, Ser: 4.34 mg/dL — ABNORMAL HIGH (ref 0.61–1.24)
GFR calc Af Amer: 13 mL/min — ABNORMAL LOW (ref >60)
GFR calc non Af Amer: 13 mL/min — ABNORMAL LOW (ref >60)
GFR, EST AFRICAN AMERICAN: 15 mL/min — AB (ref >60)
GFR, EST NON AFRICAN AMERICAN: 12 mL/min — AB (ref >60)
Glucose, Bld: 82 mg/dL (ref 65–99)
Glucose, Bld: 91 mg/dL (ref 65–99)
POTASSIUM: 4.8 mmol/L (ref 3.5–5.1)
POTASSIUM: 4.9 mmol/L (ref 3.5–5.1)
SODIUM: 137 mmol/L (ref 135–145)
SODIUM: 139 mmol/L (ref 135–145)

## 2016-10-14 LAB — CBC
HCT: 28.8 % — ABNORMAL LOW (ref 40.0–52.0)
HEMATOCRIT: 29.5 % — AB (ref 40.0–52.0)
Hemoglobin: 9.4 g/dL — ABNORMAL LOW (ref 13.0–18.0)
Hemoglobin: 9.7 g/dL — ABNORMAL LOW (ref 13.0–18.0)
MCH: 30.1 pg (ref 26.0–34.0)
MCH: 30.4 pg (ref 26.0–34.0)
MCHC: 32.6 g/dL (ref 32.0–36.0)
MCHC: 33 g/dL (ref 32.0–36.0)
MCV: 92.2 fL (ref 80.0–100.0)
MCV: 92.3 fL (ref 80.0–100.0)
PLATELETS: 156 10*3/uL (ref 150–440)
Platelets: 182 10*3/uL (ref 150–440)
RBC: 3.13 MIL/uL — AB (ref 4.40–5.90)
RBC: 3.21 MIL/uL — ABNORMAL LOW (ref 4.40–5.90)
RDW: 16.8 % — AB (ref 11.5–14.5)
RDW: 16.9 % — ABNORMAL HIGH (ref 11.5–14.5)
WBC: 6.8 10*3/uL (ref 3.8–10.6)
WBC: 9.3 10*3/uL (ref 3.8–10.6)

## 2016-10-14 LAB — URINALYSIS, COMPLETE (UACMP) WITH MICROSCOPIC
BILIRUBIN URINE: NEGATIVE
Bacteria, UA: NONE SEEN
GLUCOSE, UA: NEGATIVE mg/dL
Ketones, ur: NEGATIVE mg/dL
Leukocytes, UA: NEGATIVE
NITRITE: NEGATIVE
PH: 6 (ref 5.0–8.0)
Protein, ur: 30 mg/dL — AB
SPECIFIC GRAVITY, URINE: 1.01 (ref 1.005–1.030)

## 2016-10-14 LAB — TROPONIN I: Troponin I: 0.03 ng/mL (ref <0.03)

## 2016-10-14 LAB — ETHANOL: Alcohol, Ethyl (B): 84 mg/dL — ABNORMAL HIGH (ref <5)

## 2016-10-14 LAB — SURGICAL PCR SCREEN
MRSA, PCR: NEGATIVE
STAPHYLOCOCCUS AUREUS: POSITIVE — AB

## 2016-10-14 MED ORDER — LORAZEPAM 2 MG/ML IJ SOLN: 1.0000 mg | Freq: Four times a day (QID) | INTRAMUSCULAR | Status: AC | PRN

## 2016-10-14 MED ORDER — DONEPEZIL HCL 5 MG PO TABS
5.0000 mg | ORAL_TABLET | Freq: Every day | ORAL | Status: DC
  Administered 2016-10-14 – 2016-10-18 (×5): 5 mg via ORAL
  Filled 2016-10-14 (×8): qty 1

## 2016-10-14 MED ORDER — TRAMADOL HCL 50 MG PO TABS: 50.0000 mg | ORAL_TABLET | Freq: Two times a day (BID) | ORAL | Status: DC | PRN

## 2016-10-14 MED ORDER — FOLIC ACID 1 MG PO TABS
1.0000 mg | ORAL_TABLET | Freq: Every day | ORAL | Status: DC
  Administered 2016-10-14 – 2016-10-19 (×6): 1 mg via ORAL
  Filled 2016-10-14 (×6): qty 1

## 2016-10-14 MED ORDER — HYDROCODONE-ACETAMINOPHEN 5-325 MG PO TABS
1.0000 | ORAL_TABLET | ORAL | Status: DC | PRN
  Administered 2016-10-14: 1 via ORAL
  Filled 2016-10-14: qty 1

## 2016-10-14 MED ORDER — MORPHINE SULFATE (PF) 2 MG/ML IV SOLN
2.0000 mg | Freq: Once | INTRAVENOUS | Status: AC
  Administered 2016-10-14: 2 mg via INTRAVENOUS
  Filled 2016-10-14: qty 1

## 2016-10-14 MED ORDER — PANTOPRAZOLE SODIUM 40 MG PO TBEC
40.0000 mg | DELAYED_RELEASE_TABLET | Freq: Every day | ORAL | Status: DC
  Administered 2016-10-14 – 2016-10-19 (×6): 40 mg via ORAL
  Filled 2016-10-14 (×6): qty 1

## 2016-10-14 MED ORDER — DILTIAZEM HCL ER COATED BEADS 120 MG PO CP24
120.0000 mg | ORAL_CAPSULE | Freq: Every day | ORAL | Status: DC
  Administered 2016-10-14 – 2016-10-19 (×6): 120 mg via ORAL
  Filled 2016-10-14 (×6): qty 1

## 2016-10-14 MED ORDER — LORAZEPAM 2 MG PO TABS: 0.0000 mg | ORAL_TABLET | Freq: Two times a day (BID) | ORAL | Status: DC

## 2016-10-14 MED ORDER — METOPROLOL SUCCINATE ER 50 MG PO TB24
100.0000 mg | ORAL_TABLET | Freq: Every day | ORAL | Status: DC
  Administered 2016-10-14 – 2016-10-19 (×7): 100 mg via ORAL
  Filled 2016-10-14 (×6): qty 2

## 2016-10-14 MED ORDER — ACETAMINOPHEN 650 MG RE SUPP
650.0000 mg | Freq: Four times a day (QID) | RECTAL | Status: DC | PRN
Start: 2016-10-14 — End: 2016-10-19

## 2016-10-14 MED ORDER — KETOROLAC TROMETHAMINE 15 MG/ML IJ SOLN: 15.0000 mg | Freq: Four times a day (QID) | INTRAMUSCULAR | Status: DC | PRN

## 2016-10-14 MED ORDER — SIMVASTATIN 20 MG PO TABS
20.0000 mg | ORAL_TABLET | Freq: Every day | ORAL | Status: DC
  Administered 2016-10-14 – 2016-10-17 (×4): 20 mg via ORAL
  Filled 2016-10-14 (×2): qty 1
  Filled 2016-10-14 (×2): qty 2
  Filled 2016-10-14: qty 1

## 2016-10-14 MED ORDER — APIXABAN 2.5 MG PO TABS
2.5000 mg | ORAL_TABLET | Freq: Two times a day (BID) | ORAL | Status: DC
  Administered 2016-10-14 – 2016-10-19 (×11): 2.5 mg via ORAL
  Filled 2016-10-14 (×11): qty 1

## 2016-10-14 MED ORDER — SODIUM CHLORIDE 0.9 % IV SOLN
INTRAVENOUS | Status: DC
  Administered 2016-10-14: 05:00:00 via INTRAVENOUS

## 2016-10-14 MED ORDER — ONDANSETRON HCL 4 MG/2ML IJ SOLN
4.0000 mg | Freq: Once | INTRAMUSCULAR | Status: AC
  Administered 2016-10-14: 4 mg via INTRAVENOUS
  Filled 2016-10-14: qty 2

## 2016-10-14 MED ORDER — TORSEMIDE 20 MG PO TABS: 20.0000 mg | ORAL_TABLET | Freq: Two times a day (BID) | ORAL | Status: DC | PRN

## 2016-10-14 MED ORDER — NITROGLYCERIN 0.4 MG SL SUBL: 0.4000 mg | SUBLINGUAL_TABLET | SUBLINGUAL | Status: DC | PRN

## 2016-10-14 MED ORDER — LORAZEPAM 1 MG PO TABS: 1.0000 mg | ORAL_TABLET | Freq: Four times a day (QID) | ORAL | Status: AC | PRN

## 2016-10-14 MED ORDER — SENNOSIDES-DOCUSATE SODIUM 8.6-50 MG PO TABS: 1.0000 | ORAL_TABLET | Freq: Every evening | ORAL | Status: DC | PRN

## 2016-10-14 MED ORDER — ACETAMINOPHEN 325 MG PO TABS: 650.0000 mg | ORAL_TABLET | Freq: Four times a day (QID) | ORAL | Status: DC | PRN

## 2016-10-14 MED ORDER — ESCITALOPRAM OXALATE 10 MG PO TABS
10.0000 mg | ORAL_TABLET | Freq: Every day | ORAL | Status: DC
  Administered 2016-10-14 – 2016-10-19 (×6): 10 mg via ORAL
  Filled 2016-10-14 (×6): qty 1

## 2016-10-14 MED ORDER — MIRTAZAPINE 15 MG PO TABS
15.0000 mg | ORAL_TABLET | Freq: Every day | ORAL | Status: DC
  Administered 2016-10-14 – 2016-10-18 (×5): 15 mg via ORAL
  Filled 2016-10-14 (×5): qty 1

## 2016-10-14 MED ORDER — TAMSULOSIN HCL 0.4 MG PO CAPS
0.4000 mg | ORAL_CAPSULE | Freq: Every day | ORAL | Status: DC
  Administered 2016-10-14 – 2016-10-18 (×5): 0.4 mg via ORAL
  Filled 2016-10-14 (×5): qty 1

## 2016-10-14 MED ORDER — ORAL CARE MOUTH RINSE
15.0000 mL | Freq: Two times a day (BID) | OROMUCOSAL | Status: DC
  Administered 2016-10-14 – 2016-10-18 (×8): 15 mL via OROMUCOSAL

## 2016-10-14 MED ORDER — PREMIER PROTEIN SHAKE
11.0000 [oz_av] | Freq: Two times a day (BID) | ORAL | Status: DC
  Administered 2016-10-14 – 2016-10-17 (×4): 11 [oz_av] via ORAL

## 2016-10-14 MED ORDER — VITAMIN B-1 100 MG PO TABS
100.0000 mg | ORAL_TABLET | Freq: Every day | ORAL | Status: DC
  Administered 2016-10-14 – 2016-10-19 (×6): 100 mg via ORAL
  Filled 2016-10-14 (×6): qty 1

## 2016-10-14 MED ORDER — LORAZEPAM 2 MG PO TABS: 0.0000 mg | ORAL_TABLET | Freq: Four times a day (QID) | ORAL | Status: DC

## 2016-10-14 MED ORDER — ONDANSETRON HCL 4 MG/2ML IJ SOLN: 4.0000 mg | Freq: Four times a day (QID) | INTRAMUSCULAR | Status: DC | PRN

## 2016-10-14 MED ORDER — ADULT MULTIVITAMIN W/MINERALS CH
1.0000 | ORAL_TABLET | Freq: Every day | ORAL | Status: DC
  Administered 2016-10-14 – 2016-10-19 (×6): 1 via ORAL
  Filled 2016-10-14 (×6): qty 1

## 2016-10-14 MED ORDER — ONDANSETRON HCL 4 MG PO TABS: 4.0000 mg | ORAL_TABLET | Freq: Four times a day (QID) | ORAL | Status: DC | PRN

## 2016-10-14 MED ORDER — GUAIFENESIN ER 600 MG PO TB12: 600.0000 mg | ORAL_TABLET | Freq: Two times a day (BID) | ORAL | Status: DC | PRN

## 2016-10-14 MED ORDER — MIDODRINE HCL 5 MG PO TABS
10.0000 mg | ORAL_TABLET | Freq: Three times a day (TID) | ORAL | Status: DC
  Administered 2016-10-14 – 2016-10-19 (×12): 10 mg via ORAL
  Filled 2016-10-14 (×17): qty 2

## 2016-10-14 NOTE — Consult Note (Signed)
ORTHOPAEDIC CONSULTATION  PATIENT NAME: [name] DOB: [birth year]  MRN: [id number]  REQUESTING PHYSICIAN: Baxter Hire, MD  Chief Complaint: Left hip pain  HPI: [name] is a 83 y.o. male who sustained a mechanical fall in his kitchen last night. He was unable to stand or bear weight on the left lower extremity due to the pain, and remained on the floor until his wife summoned EMS this morning. He has remote history of left total hip arthroplasty with subsequent revision performed by Dr. Margaretmary Eddy. He reports left hip pain. He denies any other injury. He denies any loss of consciousness. Prior to the fall he was occasionally using a cane for ambulation. He admits to some difficulty walking due to ankle symptoms related to previous fractures.  According to his wife the patient had been drinking earlier in the evening. Blood alcohol level in the Emergency Department was 85.  Past Medical History:  Diagnosis Date  . AAA (abdominal aortic aneurysm) ([place])    a. s/p repair - 1999 @ [facility] @ time of CABG.  . Broken ankle   . Cancer ([place])    skin cancer nose  . Chronic atrial fibrillation ([facility])    a. on pradaxa  . Chronic diastolic CHF (congestive heart failure) ([place])    a. 01/2012 Echo:  EF 55%.  . Chronic obstructive pulmonary disease ([place])   . CKD (chronic kidney disease), stage III    a. Acute on chronic 03/2012  . Coronary artery disease    a. s/p MI x 3;  b. s/p CABG x2 in 1999;  c. Cath 2007: LIMA->LAD patent, VG->OM 100, PCI/DES of RPL/mid RCA (cypher DES).  . GERD (gastroesophageal reflux disease)   . Hyperlipidemia   . Hypertension   . PVD (peripheral vascular disease) ([place])   . Sleep apnea   . Tachy-brady syndrome ([place])    a. s/p PPM 2010.   Past Surgical History:  Procedure Laterality Date  . ABDOMINAL AORTIC ANEURYSM REPAIR    . ANKLE SURGERY Right   . CORONARY ARTERY BYPASS GRAFT    . FOOT SURGERY     left foot surgery to remove cyst.  . INSERT /  REPLACE / REMOVE PACEMAKER    . Left total hip arthroplasty with revision     Dr. Margaretmary Eddy  . ORIF ANKLE FRACTURE Left 01/08/2015   Procedure: OPEN REDUCTION INTERNAL FIXATION (ORIF) ANKLE FRACTURE;  Surgeon: Earnestine Leys, MD;  Location: [facility] ORS;  Service: Orthopedics;  Laterality: Left;  . skin cancer biopsy     nose  . TRIGGER FINGER RELEASE     Social History   Social History  . Marital status: Married    Spouse name: N/A  . Number of children: N/A  . Years of education: N/A   Occupational History  . retired    Social History Main Topics  . Smoking status: Former Smoker    Packs/day: 1.00    Years: 20.00    Quit date: 04/11/1997  . Smokeless tobacco: Never Used  . Alcohol use No     Comment: 1 bottle of bourbon a week   . Drug use: No  . Sexual activity: Not Asked   Other Topics Concern  . None   Social History Narrative   Lives locally with his wife.  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     Family History  Problem Relation Age of Onset  . Stomach cancer Mother  died @ 23  . Diabetes Mother   . Hypertension Mother   . Coronary artery disease Mother   . Cerebral aneurysm Father        died @ 53  . Diabetes Father   . Hypertension Father   . Coronary artery disease Father    Allergies  Allergen Reactions  . Lipitor [Atorvastatin] Other (See Comments)    Arm pain   Prior to Admission medications   Medication Sig Start Date End Date Taking? Authorizing Provider  acetaminophen (TYLENOL) 325 MG tablet Take 650 mg by mouth every 6 (six) hours as needed.    [provider]  diltiazem (CARDIZEM CD) 120 MG 24 hr capsule Take 1 capsule (120 mg total) by mouth daily. Patient taking differently: Take 120 mg by mouth daily with lunch.  07/15/14   Deboraha Sprang, MD  donepezil (ARICEPT) 5 MG tablet Take 5 mg by mouth at bedtime.    [provider]  ELIQUIS 2.5 MG TABS tablet TAKE ONE TABLET BY MOUTH TWICE DAILY 09/09/16   Minna Merritts, MD  guaiFENesin (MUCINEX) 600 MG 12 hr tablet Take 1 tablet (600 mg total) by mouth 2 (two) times daily as needed for cough or to loosen phlegm. 07/22/16   Harvest Dark, MD  metoprolol succinate (TOPROL-XL) 100 MG 24 hr tablet Take 1 tablet (100 mg total) by mouth daily. Patient taking differently: Take 100 mg by mouth daily with lunch.  06/03/13   Deboraha Sprang, MD  midodrine (PROAMATINE) 10 MG tablet Take 1 tablet (10 mg total) by mouth 3 (three) times daily. 08/09/16   Minna Merritts, MD  nitroGLYCERIN (NITROSTAT) 0.4 MG SL tablet Place 1 tablet (0.4 mg total) under the tongue every 5 (five) minutes as needed for chest pain. 10/06/15   Minna Merritts, MD  omeprazole (PRILOSEC) 20 MG capsule Take 20 mg by mouth daily with lunch.     [provider]  simvastatin (ZOCOR) 20 MG tablet Take 1 tablet (20 mg total) by mouth at bedtime. 11/13/13   Minna Merritts, MD  Tamsulosin HCl (FLOMAX) 0.4 MG CAPS Take 0.4 mg by mouth daily after supper.    [provider]  torsemide (DEMADEX) 20 MG tablet Take 1 tablet (20 mg total) by mouth 2 (two) times daily as needed (for weight gain). 03/22/16   Minna Merritts, MD  traMADol (ULTRAM) 50 MG tablet Take 1 tablet (50 mg total) by mouth every 6 (six) hours as needed. 07/22/16 07/22/17  Harvest Dark, MD   Ct Hip Left Wo Contrast  Result Date: 10/14/2016 CLINICAL DATA:  Left hip pain after fall. EXAM: CT OF THE LEFT HIP WITHOUT CONTRAST TECHNIQUE: Multidetector CT imaging of the left hip was performed according to the standard protocol. Multiplanar CT image reconstructions were also generated. COMPARISON:  Radiographs earlier this day. FINDINGS: Bones/Joint/Cartilage Left hip arthroplasty with cerclage wire fixation. Acute nondisplaced fracture of the subtrochanteric femur just below the intertrochanteric region. Fracture may abut the femoral stem. There is heterotopic calcification about the greater trochanter superiorly. No  additional fracture of the left hip or pelvis. Pubic rami are intact. No visualize sacral fracture. Left sacroiliac joint and pubic symphysis are congruent were visualized. Ligaments Suboptimally assessed by CT. Muscles and Tendons No evidence of intramuscular hematoma. Soft tissues Soft tissue density adjacent is cerclage wire extending to skin may be postsurgical change or hematoma. Fat within both inguinal canals. Colonic diverticulosis is incidentally noted. Atherosclerotic calcifications of pelvic  vasculature. IMPRESSION: Left hip arthroplasty with acute minimally displaced periprosthetic fracture in the subtrochanteric region. Electronically Signed   By: Jeb Levering M.D.   On: 10/14/2016 02:33   Dg Chest Port 1 View  Result Date: 10/14/2016 CLINICAL DATA:  Fall EXAM: PORTABLE CHEST 1 VIEW COMPARISON:  Chest radiograph 07/22/2016 FINDINGS: Cardiomediastinal silhouette remains enlarged. Single lead left chest wall pacemaker is unchanged. There are findings of prior CABG. Calcified granuloma at the left lung base. No focal consolidation or pulmonary edema. IMPRESSION: No active disease. Electronically Signed   By: Ulyses Jarred M.D.   On: 10/14/2016 00:37   Dg Hip Unilat With Pelvis 2-3 Views Left  Result Date: 10/14/2016 CLINICAL DATA:  Left hip pain after fall from standing in the kitchen. EXAM: DG HIP (WITH OR WITHOUT PELVIS) 2-3V LEFT COMPARISON:  Left hip radiograph 02/19/2007 FINDINGS: Left hip arthroplasty in expected alignment with cerclage wire. Mild heterotopic calcification adjacent to the greater trochanter. Cortical irregularity laterally about the intertrochanteric region is unchanged from remote prior exam. No evidence of acute or periprosthetic fracture. No periprosthetic lucency. Pubic rami are intact. Pubic symphysis and sacroiliac joints are congruent. IMPRESSION: Left hip arthroplasty in place without evidence of acute or periprosthetic fracture. Mild heterotopic calcification  adjacent to the greater trochanter. Electronically Signed   By: Jeb Levering M.D.   On: 10/14/2016 00:37    Positive ROS: All other systems have been reviewed and were otherwise negative with the exception of those mentioned in the HPI and as above.  Physical Exam: General: Well developed, well nourished male seen in no acute distress. HEENT: Atraumatic and normocephalic. Sclera are clear. Extraocular motion is intact. Oropharynx is clear with moist mucosa. Neck: Supple, nontender, good range of motion. No JVD or carotid bruits. Lungs: Clear to auscultation bilaterally. Cardiovascular: Irregular rate and rhythm with normal S1 and S2. No murmurs. No gallops or rubs. Pedal pulses are palpable bilaterally. Homans test is negative bilaterally. No significant pretibial or ankle edema. Abdomen: Soft, nontender, and nondistended. Bowel sounds are present. Skin: No lesions in the area of chief complaint Neurologic: Awake, alert, and oriented. Sensory function is grossly intact. Motor strength is felt to be 5 over 5 bilaterally with the exception of the left hip musculature which was not assessed secondary to the injury. No clonus or tremor. Good motor coordination. Lymphatic: No axillary or cervical lymphadenopathy  MUSCULOSKELETAL: Examination of the left lower extremity demonstrates tenderness to palpation about the proximal thigh. No gross swelling or ecchymosis is appreciated. Pain is elicited with passive range of motion of the hip. No gross tenderness to palpation about the knee or ankle. No knee effusion. The patient is able to actively extend the knee so as to let the foot off the bed. He is unable to perform ankle slide independently. Limb lengths appear to be equal with the patient supine.  Radiographs: I reviewed radiographs of the left hip as well as CT scan of the left hip. Total hip implants are in place including a tri-spike porous-coated acetabular component and a fully porous-coated  AML femoral implant. There is a periprosthetic fracture in the metaphyseal region. No radiographic evidence of instability of the implants. Of note is a cerclage wire around the proximal portion of the femur.  Assessment: [place] A1 left hip periprosthetic fracture  Plan: The findings were discussed in detail with the patient and his wife. There is no radiographic evidence of instability of the femoral components and the fracture appears to be above the  well fixed distal aspect of the femoral implant. Given these findings, I did not believe there is an indication for surgical intervention at this time. Physical therapy was consult to to work on bed mobility with progression to transfers and eventually gait training. He should be foot flat touchdown weightbearing to the left lower extremity.  The patient is already been placed on CIWA protocol. I have also placed a consult for psychiatry given the patient's alcohol abuse, history of depression, and the recent death of his son. His wife expressed concerns about his history of driving while intoxicated.  I have left a message with the [facility] office to notify Dr. Earnestine Leys of the patient's admission.  Banjamin Stovall P. Holley Bouche M.D.

## 2016-10-14 NOTE — H&P (Signed)
[name] [place] NAME: [name]    MR#:  [id number]  DATE OF BIRTH:  Feb 16, 1934  DATE OF ADMISSION:  10/13/2016  PRIMARY CARE PHYSICIAN: Robert Pink, MD   REQUESTING/REFERRING PHYSICIAN:   CHIEF COMPLAINT:   Chief Complaint  Patient presents with  . Hip Pain  . Leg Pain    HISTORY OF PRESENT ILLNESS: [name]  is a 83 y.o. male with a known history of Abdominal aortic aneurysm , nasal cancer, chronic atrial fibrillation on anticoagulation with liquids, coronary artery disease, COPD, chronic kidney disease stage III, diastolic heart failure, peripheral vascular disease presented to the emergency room with left hip pain. Patient drank alcohol and lost balance and fell down at home. It happened in his kitchen and he was lying on the kitchen floor. When he fell down he landed on the left side of the hip. He has aching pain in the left hip which is 7 out of 10 on a scale of 1-10. Patient had hip surgery on the left side and has a prosthesis in the past. Imaging studies today revealed fracture of the prosthesis. Orthopedic surgery was consulted by ER physician. No chest pain, shortness of breath. Patient blood alcohol level was 84. He is awake and responds to all verbal commands.  PAST MEDICAL HISTORY:   Past Medical History:  Diagnosis Date  . AAA (abdominal aortic aneurysm) ([place])    a. s/p repair - 1999 @ [facility] @ time of CABG.  . Broken ankle   . Cancer ([place])    skin cancer nose  . Chronic atrial fibrillation ([facility])    a. on pradaxa  . Chronic diastolic CHF (congestive heart failure) ([place])    a. 01/2012 Echo:  EF 55%.  . Chronic obstructive pulmonary disease ([place])   . CKD (chronic kidney disease), stage III    a. Acute on chronic 03/2012  . Coronary artery disease    a. s/p MI x 3;  b. s/p CABG x2 in 1999;  c. Cath 2007: LIMA->LAD patent, VG->OM 100, PCI/DES of RPL/mid RCA (cypher DES).  . GERD (gastroesophageal reflux  disease)   . Hyperlipidemia   . Hypertension   . PVD (peripheral vascular disease) ([place])   . Sleep apnea   . Tachy-brady syndrome ([place])    a. s/p PPM 2010.    PAST SURGICAL HISTORY: Past Surgical History:  Procedure Laterality Date  . ABDOMINAL AORTIC ANEURYSM REPAIR    . ANKLE SURGERY Right   . CORONARY ARTERY BYPASS GRAFT    . FOOT SURGERY     left foot surgery to remove cyst.  . INSERT / REPLACE / REMOVE PACEMAKER    . ORIF ANKLE FRACTURE Left 01/08/2015   Procedure: OPEN REDUCTION INTERNAL FIXATION (ORIF) ANKLE FRACTURE;  Surgeon: Robert Leys, MD;  Location: [facility] ORS;  Service: Orthopedics;  Laterality: Left;  . skin cancer biopsy     nose  . TRIGGER FINGER RELEASE      SOCIAL HISTORY:  Social History  Substance Use Topics  . Smoking status: Former Smoker    Packs/day: 1.00    Years: 20.00    Quit date: 04/11/1997  . Smokeless tobacco: Never Used  . Alcohol use No     Comment: 1 bottle of bourbon a week     FAMILY HISTORY:  Family History  Problem Relation Age of Onset  . Stomach cancer Mother        died @ 49  .  Diabetes Mother   . Hypertension Mother   . Coronary artery disease Mother   . Cerebral aneurysm Father        died @ 70  . Diabetes Father   . Hypertension Father   . Coronary artery disease Father     DRUG ALLERGIES:  Allergies  Allergen Reactions  . Lipitor [Atorvastatin] Other (See Comments)    Arm pain    REVIEW OF SYSTEMS:   CONSTITUTIONAL: No fever, fatigue or weakness.  EYES: No blurred or double vision.  EARS, NOSE, AND THROAT: No tinnitus or ear pain.  RESPIRATORY: No cough, shortness of breath, wheezing or hemoptysis.  CARDIOVASCULAR: No chest pain, orthopnea, edema.  GASTROINTESTINAL: No nausea, vomiting, diarrhea or abdominal pain.  GENITOURINARY: No dysuria, hematuria.  ENDOCRINE: No polyuria, nocturia,  HEMATOLOGY: No anemia, easy bruising or bleeding SKIN: No rash or lesion. MUSCULOSKELETAL: Has left hip  pain NEUROLOGIC: No tingling, numbness, weakness.  PSYCHIATRY: No anxiety or depression.   MEDICATIONS AT HOME:  Prior to Admission medications   Medication Sig Start Date End Date Taking? Authorizing Provider  acetaminophen (TYLENOL) 325 MG tablet Take 650 mg by mouth every 6 (six) hours as needed.    [provider]  diltiazem (CARDIZEM CD) 120 MG 24 hr capsule Take 1 capsule (120 mg total) by mouth daily. Patient taking differently: Take 120 mg by mouth daily with lunch.  07/15/14   Robert Sprang, MD  donepezil (ARICEPT) 5 MG tablet Take 5 mg by mouth at bedtime.    [provider]  ELIQUIS 2.5 MG TABS tablet TAKE ONE TABLET BY MOUTH TWICE DAILY 09/09/16   Robert Merritts, MD  guaiFENesin (MUCINEX) 600 MG 12 hr tablet Take 1 tablet (600 mg total) by mouth 2 (two) times daily as needed for cough or to loosen phlegm. 07/22/16   Robert Dark, MD  metoprolol succinate (TOPROL-XL) 100 MG 24 hr tablet Take 1 tablet (100 mg total) by mouth daily. Patient taking differently: Take 100 mg by mouth daily with lunch.  06/03/13   Robert Sprang, MD  midodrine (PROAMATINE) 10 MG tablet Take 1 tablet (10 mg total) by mouth 3 (three) times daily. 08/09/16   Robert Merritts, MD  nitroGLYCERIN (NITROSTAT) 0.4 MG SL tablet Place 1 tablet (0.4 mg total) under the tongue every 5 (five) minutes as needed for chest pain. 10/06/15   Robert Merritts, MD  omeprazole (PRILOSEC) 20 MG capsule Take 20 mg by mouth daily with lunch.     [provider]  simvastatin (ZOCOR) 20 MG tablet Take 1 tablet (20 mg total) by mouth at bedtime. 11/13/13   Robert Merritts, MD  Tamsulosin HCl (FLOMAX) 0.4 MG CAPS Take 0.4 mg by mouth daily after supper.    [provider]  torsemide (DEMADEX) 20 MG tablet Take 1 tablet (20 mg total) by mouth 2 (two) times daily as needed (for weight gain). 03/22/16   Robert Merritts, MD  traMADol (ULTRAM) 50 MG tablet Take 1 tablet (50 mg total) by mouth  every 6 (six) hours as needed. 07/22/16 07/22/17  Robert Dark, MD      PHYSICAL EXAMINATION:   VITAL SIGNS: Blood pressure 122/66, pulse 62, temperature (!) 97.4 F (36.3 C), temperature source Oral, resp. rate 19, height 6\' 1"  (1.854 m), weight 97.5 kg (215 lb), SpO2 94 %.  GENERAL:  83 y.o.-year-old patient lying in the bed with no acute distress.  EYES: Pupils equal, round, reactive to light and  accommodation. No scleral icterus. Extraocular muscles intact.  HEENT: Head atraumatic, normocephalic. Oropharynx and nasopharynx clear.  NECK:  Supple, no jugular venous distention. No thyroid enlargement, no tenderness.  LUNGS: Normal breath sounds bilaterally, no wheezing, rales,rhonchi or crepitation. No use of accessory muscles of respiration.  CARDIOVASCULAR: S1, S2 normal. No murmurs, rubs, or gallops.  ABDOMEN: Soft, nontender, nondistended. Bowel sounds present. No organomegaly or mass.  EXTREMITIES: No pedal edema, cyanosis, or clubbing.  Left hip tender NEUROLOGIC: Cranial nerves II through XII are intact. Muscle strength 5/5 in all extremities. Sensation intact. Gait not checked.  PSYCHIATRIC: The patient is alert and oriented x 3.  SKIN: No obvious rash, lesion, or ulcer.   LABORATORY PANEL:   CBC  Recent Labs Lab 10/13/16 2357  WBC 9.3  HGB 9.7*  HCT 29.5*  PLT 182  MCV 92.2  MCH 30.4  MCHC 33.0  RDW 16.8*   ------------------------------------------------------------------------------------------------------------------  Chemistries   Recent Labs Lab 10/13/16 2357  NA 137  K 4.9  CL 105  CO2 23  GLUCOSE 91  BUN 46*  CREATININE 4.34*  CALCIUM 8.4*   ------------------------------------------------------------------------------------------------------------------ estimated creatinine clearance is 16.4 mL/min (A) (by C-G formula based on SCr of 4.34 mg/dL  (H)). ------------------------------------------------------------------------------------------------------------------ No results for input(s): TSH, T4TOTAL, T3FREE, THYROIDAB in the last 72 hours.  Invalid input(s): FREET3   Coagulation profile No results for input(s): INR, PROTIME in the last 168 hours. ------------------------------------------------------------------------------------------------------------------- No results for input(s): DDIMER in the last 72 hours. -------------------------------------------------------------------------------------------------------------------  Cardiac Enzymes  Recent Labs Lab 10/13/16 2357  TROPONINI 0.03*   ------------------------------------------------------------------------------------------------------------------ Invalid input(s): POCBNP  ---------------------------------------------------------------------------------------------------------------  Urinalysis    Component Value Date/Time   COLORURINE STRAW (A) 10/14/2016 0211   APPEARANCEUR CLEAR (A) 10/14/2016 0211   APPEARANCEUR Clear 04/30/2014 1355   LABSPEC 1.010 10/14/2016 0211   LABSPEC 1.012 04/30/2014 1355   PHURINE 6.0 10/14/2016 0211   GLUCOSEU NEGATIVE 10/14/2016 0211   GLUCOSEU Negative 04/30/2014 1355   HGBUR SMALL (A) 10/14/2016 0211   BILIRUBINUR NEGATIVE 10/14/2016 0211   BILIRUBINUR Negative 04/30/2014 1355   KETONESUR NEGATIVE 10/14/2016 0211   PROTEINUR 30 (A) 10/14/2016 0211   UROBILINOGEN 1.0 11/15/2009 1045   NITRITE NEGATIVE 10/14/2016 0211   LEUKOCYTESUR NEGATIVE 10/14/2016 0211   LEUKOCYTESUR Negative 04/30/2014 1355     RADIOLOGY: Ct Hip Left Wo Contrast  Result Date: 10/14/2016 CLINICAL DATA:  Left hip pain after fall. EXAM: CT OF THE LEFT HIP WITHOUT CONTRAST TECHNIQUE: Multidetector CT imaging of the left hip was performed according to the standard protocol. Multiplanar CT image reconstructions were also generated. COMPARISON:   Radiographs earlier this day. FINDINGS: Bones/Joint/Cartilage Left hip arthroplasty with cerclage wire fixation. Acute nondisplaced fracture of the subtrochanteric femur just below the intertrochanteric region. Fracture may abut the femoral stem. There is heterotopic calcification about the greater trochanter superiorly. No additional fracture of the left hip or pelvis. Pubic rami are intact. No visualize sacral fracture. Left sacroiliac joint and pubic symphysis are congruent were visualized. Ligaments Suboptimally assessed by CT. Muscles and Tendons No evidence of intramuscular hematoma. Soft tissues Soft tissue density adjacent is cerclage wire extending to skin may be postsurgical change or hematoma. Fat within both inguinal canals. Colonic diverticulosis is incidentally noted. Atherosclerotic calcifications of pelvic vasculature. IMPRESSION: Left hip arthroplasty with acute minimally displaced periprosthetic fracture in the subtrochanteric region. Electronically Signed   By: Jeb Levering M.D.   On: 10/14/2016 02:33   Dg Chest Port 1 View  Result Date: 10/14/2016 CLINICAL DATA:  Fall EXAM: PORTABLE CHEST 1 VIEW COMPARISON:  Chest radiograph 07/22/2016 FINDINGS: Cardiomediastinal silhouette remains enlarged. Single lead left chest wall pacemaker is unchanged. There are findings of prior CABG. Calcified granuloma at the left lung base. No focal consolidation or pulmonary edema. IMPRESSION: No active disease. Electronically Signed   By: Ulyses Jarred M.D.   On: 10/14/2016 00:37   Dg Hip Unilat With Pelvis 2-3 Views Left  Result Date: 10/14/2016 CLINICAL DATA:  Left hip pain after fall from standing in the kitchen. EXAM: DG HIP (WITH OR WITHOUT PELVIS) 2-3V LEFT COMPARISON:  Left hip radiograph 02/19/2007 FINDINGS: Left hip arthroplasty in expected alignment with cerclage wire. Mild heterotopic calcification adjacent to the greater trochanter. Cortical irregularity laterally about the intertrochanteric  region is unchanged from remote prior exam. No evidence of acute or periprosthetic fracture. No periprosthetic lucency. Pubic rami are intact. Pubic symphysis and sacroiliac joints are congruent. IMPRESSION: Left hip arthroplasty in place without evidence of acute or periprosthetic fracture. Mild heterotopic calcification adjacent to the greater trochanter. Electronically Signed   By: Jeb Levering M.D.   On: 10/14/2016 00:37    EKG: Orders placed or performed during the hospital encounter of 10/13/16  . ED EKG  . ED EKG    IMPRESSION AND PLAN: 83 year old male patient with history of chronic kidney disease, left hip prosthesis, nasal cancer, abdominal aortic aneurysm, coronary artery disease, diastolic heart failure presented to the emergency room with tenderness in the left hip after a fall.  Admitting diagnosis 1. Left hip prosthesis fracture 2. Accidental fall 3. Alcohol abuse 4. Left hip pain 5. Coronary artery disease 6. Diastolic heart failure Treatment plan Admit patient to medical floor NPO Hold eliquis Orthopedic surgery consultation Pain management with oral Percocet and IV Toradol DVT prophylaxis sequential compression devices to lower extremities  All the records are reviewed and case discussed with ED provider. Management plans discussed with the patient, family and they are in agreement.  CODE STATUS:FULL CODE Surrogate decision maker : wife    Code Status Orders        Start     Ordered   10/14/16 0433  Full code  Continuous     10/14/16 0432    Code Status History    Date Active Date Inactive Code Status Order ID Comments User Context   11/24/2015  5:27 PM 11/26/2015  8:13 PM Full Code [id number]  Epifanio Lesches, MD ED   01/08/2015 11:16 PM 01/10/2015  8:35 PM Full Code [id number]  Robert Leys, MD Inpatient   01/07/2015  3:06 PM 01/08/2015 11:16 PM Full Code [id number]  Robert Leys, MD ED   08/22/2014  8:47 PM 08/25/2014  5:54 PM Full Code [id number]   Dustin Flock, MD ED    Advance Directive Documentation     Most Recent Value  Type of Advance Directive  Living will  Pre-existing out of facility DNR order (yellow form or [facility] MOST form)  -  "MOST" Form in Place?  -       TOTAL TIME TAKING CARE OF THIS PATIENT: 50 minutes.    Saundra Shelling M.D on 10/14/2016 at 4:46 AM  Between 7am to 6pm - Pager - [phone]  After 6pm go to [email or web address] - password [email or web address] [facility]  [facility]  Office  [phone]  CC: Primary care physician; Robert Pink, MD

## 2016-10-14 NOTE — ED Notes (Signed)
Cleaned pt and gave new diaper

## 2016-10-14 NOTE — Progress Notes (Signed)
Pt alert and oriented. VSS. Denies pain at this time. SCDs on. 2L O2 acute nasal cannula, For O2 needs when asleep. Telemetry and contin pulse ox applied. Pt called wife and updated. Oriented to floor and updated on POC.

## 2016-10-14 NOTE — Progress Notes (Signed)
Initial Nutrition Assessment  DOCUMENTATION CODES:   Not applicable  INTERVENTION:   Premier Protein BID, each supplement provides 160kcal and 30g protein.   MVI  NUTRITION DIAGNOSIS:   Increased nutrient needs related to  (hip fracture and etoh abuse) as evidenced by increased estimated needs from protein.  GOAL:   Patient will meet greater than or equal to 90% of their needs  MONITOR:   PO intake, Supplement acceptance, Labs, Weight trends  REASON FOR ASSESSMENT:   Malnutrition Screening Tool    ASSESSMENT:   83 y.o. male with a known history of Abdominal aortic aneurysm , nasal cancer, chronic atrial fibrillation on anticoagulation with liquids, coronary artery disease, COPD, chronic kidney disease stage III, diastolic heart failure, peripheral vascular disease presented to the emergency room with left hip pain. Patient drank alcohol and lost balance and fell down at home. It happened in his kitchen and he was lying on the kitchen floor. When he fell down he landed on the left side of the hip. He has aching pain in the left hip which is 7 out of 10 on a scale of 1-10. Patient had hip surgery on the left side and has a prosthesis in the past. Imaging studies today revealed fracture of the prosthesis.   Pt with h/o etoh abuse. Pt eating 100% of meals currently. Per chart, pt appears to have had some weight loss but RD unsure as to how much as pt documented to have many weight fluctuations. Pt on CIWA protocol. RD will order supplements to help pt meet estimated protein needs.   Medications reviewed and include: folic acid, MVI, protonix, thiamine  Labs reviewed: BUN 42(H), creat 4.01(H), Ca 8.4(L) Hgb 9.4(L), Hct 28.8(L)  Nutrition-Focused physical exam completed. Findings are no fat depletion, no muscle depletion, and no edema.   Diet Order:  Diet Heart Room service appropriate? Yes; Fluid consistency: Thin  Skin:  Reviewed, no issues  Last BM:  none since  admit  Height:   Ht Readings from Last 1 Encounters:  10/13/16 6\' 1"  (1.854 m)    Weight:   Wt Readings from Last 1 Encounters:  10/13/16 215 lb (97.5 kg)    Ideal Body Weight:  83.6 kg  BMI:  Body mass index is 28.37 kg/m.  Estimated Nutritional Needs:   Kcal:  2300-2600kcal/day   Protein:  98-117g/day   Fluid:  >2L/day   EDUCATION NEEDS:   No education needs identified at this time  Koleen Distance MS, RD, [place] Pager #[phone] After Hours Pager: [phone]

## 2016-10-14 NOTE — Consult Note (Signed)
[facility] Consult   Reason for Consult:  Consult for 83 year old man with a history of depression and alcohol abuse who is in the hospital because of a fall Referring Physician:  Edwina Barth [name] MRN:  [id number] Principal Diagnosis: Moderate recurrent major depression ([place]) Diagnosis:   Patient Active Problem List   Diagnosis Date Noted  . Hip fracture ([place]) [S72.009A] 10/14/2016  . Moderate recurrent major depression ([place]) [F33.1] 10/14/2016  . Syncope [R55] 11/24/2015  . Depression, major, single episode, severe ([place]) [F32.2] 08/18/2015  . Alcohol abuse [F10.10] 08/18/2015  . Suicidal ideation [R45.851] 08/18/2015  . Bimalleolar fracture of left ankle [S82.842A] 01/07/2015  . COPD exacerbation ([place]) [J44.1] 08/22/2014  . Coronary artery disease [I25.10]   . Chronic atrial fibrillation ([place]) [I48.2]   . Chronic diastolic CHF (congestive heart failure) ([place]) [I50.32]   . Tachy-brady syndrome ([place]) [I49.5]   . CKD (chronic kidney disease), stage III [N18.3] 04/09/2012  . Chronic diastolic heart failure ([place]) [I50.32] 01/26/2012  . Chest pain [786.5] 09/23/2010  . Hyperlipidemia [E78.5] 06/12/2009  . Essential hypertension [I10] 06/12/2009  . Coronary atherosclerosis of artery bypass graft [I25.810] 06/12/2009  . ATRIAL FIBRILLATION [I48.91] 06/12/2009  . AAA [I71.4] 06/12/2009  . COPD [J44.9] 06/12/2009  . GERD [K21.9] 06/12/2009  . PACEMAKER, PERMANENT [Z95.0] 06/12/2009    Total Time spent with patient: 1 hour  [name].  HPI:  Patient interviewed chart reviewed. 83 year old man came into the hospital after a fall in the kitchen that seems to of resulted in a hip fracture. Patient had a positive alcohol level when he came in. Concern has been raised by his family about his ongoing depression and drinking. Patient admits that his mood  feels sad and down almost all of the time. He has very [place] in his life that he values. He does still maintain some friendships but doesn't have much of a sense of the future. His son died of cancer at the beginning of this year and he continues to grieve very hard for that. Patient wakes up frequently at night. He says he has an okay appetite now but he admits he is lost a lot of weight over the last few months. Patient is continuing to drink. He drinks he says several days a week. He minimizes the extent to which she believes that it is a problem. Not abusing any other drugs. He says that he and his wife fuss and fight quite a bit mostly about his drinking. [name]  Social history: Patient lives with his wife. 2 living adult children in the area. As mentioned above another son died of cancer earlier this year.  Medical history: Patient currently is in the hospital with a hip fracture.  Substance abuse history: Long-standing problem with drinking. No history of DTs or seizures. He says he's been able to stop at times for months at a time. He knows that it makes his mood worse but he avoids doing anything about it.  Past Psychiatric History: No history of psychiatric hospitalization. No history of suicide attempt. I seen this patient before under similar circumstances and at that time, last year, he was taking antidepressants. For some reason those don't seem to be part of his medicine regimen anymore. He has avoided getting actively involved in substance abuse treatment.  Risk to Self: Is  patient at risk for suicide?: No Risk to Others:   Prior Inpatient Therapy:   Prior Outpatient Therapy:    Past Medical History:  Past Medical History:  Diagnosis Date  . AAA (abdominal aortic aneurysm) ([place])    a. s/p repair - 1999 @ [facility] @ time of CABG.  . Broken ankle   . Cancer ([place])    skin cancer nose  . Chronic atrial fibrillation  ([facility])    a. on pradaxa  . Chronic diastolic CHF (congestive heart failure) ([place])    a. 01/2012 Echo:  EF 55%.  . Chronic obstructive pulmonary disease ([place])   . CKD (chronic kidney disease), stage III    a. Acute on chronic 03/2012  . Coronary artery disease    a. s/p MI x 3;  b. s/p CABG x2 in 1999;  c. Cath 2007: LIMA->LAD patent, VG->OM 100, PCI/DES of RPL/mid RCA (cypher DES).  . Depression   . GERD (gastroesophageal reflux disease)   . History of suicidal ideation   . Hyperlipidemia   . Hypertension   . PVD (peripheral vascular disease) ([place])   . Sleep apnea   . Tachy-brady syndrome ([place])    a. s/p PPM 2010.    Past Surgical History:  Procedure Laterality Date  . ABDOMINAL AORTIC ANEURYSM REPAIR    . ANKLE SURGERY Right   . CORONARY ARTERY BYPASS GRAFT    . FOOT SURGERY     left foot surgery to remove cyst.  . INSERT / REPLACE / REMOVE PACEMAKER    . Left total hip arthroplasty with revision     Dr. Margaretmary Eddy  . ORIF ANKLE FRACTURE Left 01/08/2015   Procedure: OPEN REDUCTION INTERNAL FIXATION (ORIF) ANKLE FRACTURE;  Surgeon: Earnestine Leys, MD;  Location: [facility] ORS;  Service: Orthopedics;  Laterality: Left;  . skin cancer biopsy     nose  . TRIGGER FINGER RELEASE     Family History:  Family History  Problem Relation Age of Onset  . Stomach cancer Mother        died @ 52  . Diabetes Mother   . Hypertension Mother   . Coronary artery disease Mother   . Cerebral aneurysm Father        died @ 54  . Diabetes Father   . Hypertension Father   . Coronary artery disease Father    Family Psychiatric  History: Denies any family history Social History:  History  Alcohol Use No    Comment: 1 bottle of bourbon a week      History  Drug Use No    Social History   Social History  . Marital status: Married    Spouse name: N/A  . Number of children: N/A  . Years of education: N/A   Occupational History  . retired    Social History Main Topics  . Smoking status:  Former Smoker    Packs/day: 1.00    Years: 20.00    Quit date: 04/11/1997  . Smokeless tobacco: Never Used  . Alcohol use No     Comment: 1 bottle of bourbon a week   . Drug use: No  . Sexual activity: Not Asked   Other Topics Concern  . None   Social History Narrative   Lives locally with his wife.  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     Additional Social History:    Allergies:   Allergies  Allergen Reactions  . Lipitor [Atorvastatin] Other (See Comments)  Arm pain    Labs:  Results for orders placed or performed during the hospital encounter of 10/13/16 (from the past 48 hour(s))  Basic metabolic panel     Status: Abnormal   Collection Time: 10/13/16 11:57 PM  Result Value Ref Range   Sodium 137 135 - 145 mmol/L   Potassium 4.9 3.5 - 5.1 mmol/L   Chloride 105 101 - 111 mmol/L   CO2 23 22 - 32 mmol/L   Glucose, Bld 91 65 - 99 mg/dL   BUN 46 (H) 6 - 20 mg/dL   Creatinine, Ser 4.34 (H) 0.61 - 1.24 mg/dL   Calcium 8.4 (L) 8.9 - 10.3 mg/dL   GFR calc non Af Amer 12 (L) >60 mL/min   GFR calc Af Amer 13 (L) >60 mL/min    Comment: (NOTE) The eGFR has been calculated using the CKD EPI equation. This calculation has not been validated in all clinical situations. eGFR's persistently <60 mL/min signify possible Chronic Kidney Disease.    Anion gap 9 5 - 15  CBC     Status: Abnormal   Collection Time: 10/13/16 11:57 PM  Result Value Ref Range   WBC 9.3 3.8 - 10.6 K/uL   RBC 3.21 (L) 4.40 - 5.90 MIL/uL   Hemoglobin 9.7 (L) 13.0 - 18.0 g/dL   HCT 29.5 (L) 40.0 - 52.0 %   MCV 92.2 80.0 - 100.0 fL   MCH 30.4 26.0 - 34.0 pg   MCHC 33.0 32.0 - 36.0 g/dL   RDW 16.8 (H) 11.5 - 14.5 %   Platelets 182 150 - 440 K/uL  Troponin I     Status: Abnormal   Collection Time: 10/13/16 11:57 PM  Result Value Ref Range   Troponin I 0.03 (HH) <0.03 ng/mL    Comment: CRITICAL RESULT CALLED TO, READ BACK BY AND VERIFIED WITH BRITTANY MILLER AT 0039 ON 10/14/16 RWW    Urinalysis, Complete w Microscopic     Status: Abnormal   Collection Time: 10/14/16  2:11 AM  Result Value Ref Range   Color, Urine STRAW (A) YELLOW   APPearance CLEAR (A) CLEAR   Specific Gravity, Urine 1.010 1.005 - 1.030   pH 6.0 5.0 - 8.0   Glucose, UA NEGATIVE NEGATIVE mg/dL   Hgb urine dipstick SMALL (A) NEGATIVE   Bilirubin Urine NEGATIVE NEGATIVE   Ketones, ur NEGATIVE NEGATIVE mg/dL   Protein, ur 30 (A) NEGATIVE mg/dL   Nitrite NEGATIVE NEGATIVE   Leukocytes, UA NEGATIVE NEGATIVE   RBC / HPF 0-5 0 - 5 RBC/hpf   WBC, UA 0-5 0 - 5 WBC/hpf   Bacteria, UA NONE SEEN NONE SEEN   Squamous Epithelial / LPF 0-5 (A) NONE SEEN  Ethanol     Status: Abnormal   Collection Time: 10/14/16  2:20 AM  Result Value Ref Range   Alcohol, Ethyl (B) 84 (H) <5 mg/dL    Comment:        LOWEST DETECTABLE LIMIT FOR SERUM ALCOHOL IS 5 mg/dL FOR MEDICAL PURPOSES ONLY   Surgical PCR screen     Status: Abnormal   Collection Time: 10/14/16  5:25 AM  Result Value Ref Range   MRSA, PCR NEGATIVE NEGATIVE   Staphylococcus aureus POSITIVE (A) NEGATIVE    Comment:        The Xpert SA Assay (FDA approved for NASAL specimens in patients over 57 years of age), is one component of a comprehensive surveillance program.  Test performance has been validated by [facility] for  patients greater than or equal to 7 year old. It is not intended to diagnose infection nor to guide or monitor treatment.   Basic metabolic panel     Status: Abnormal   Collection Time: 10/14/16  6:24 AM  Result Value Ref Range   Sodium 139 135 - 145 mmol/L   Potassium 4.8 3.5 - 5.1 mmol/L   Chloride 108 101 - 111 mmol/L   CO2 22 22 - 32 mmol/L   Glucose, Bld 82 65 - 99 mg/dL   BUN 42 (H) 6 - 20 mg/dL   Creatinine, Ser 4.01 (H) 0.61 - 1.24 mg/dL   Calcium 8.4 (L) 8.9 - 10.3 mg/dL   GFR calc non Af Amer 13 (L) >60 mL/min   GFR calc Af Amer 15 (L) >60 mL/min    Comment: (NOTE) The eGFR has been calculated using the CKD  EPI equation. This calculation has not been validated in all clinical situations. eGFR's persistently <60 mL/min signify possible Chronic Kidney Disease.    Anion gap 9 5 - 15  CBC     Status: Abnormal   Collection Time: 10/14/16  6:24 AM  Result Value Ref Range   WBC 6.8 3.8 - 10.6 K/uL   RBC 3.13 (L) 4.40 - 5.90 MIL/uL   Hemoglobin 9.4 (L) 13.0 - 18.0 g/dL   HCT 28.8 (L) 40.0 - 52.0 %   MCV 92.3 80.0 - 100.0 fL   MCH 30.1 26.0 - 34.0 pg   MCHC 32.6 32.0 - 36.0 g/dL   RDW 16.9 (H) 11.5 - 14.5 %   Platelets 156 150 - 440 K/uL    Current Facility-Administered Medications  Medication Dose Route Frequency Provider Last Rate Last Dose  . acetaminophen (TYLENOL) tablet 650 mg  650 mg Oral Q6H PRN Saundra Shelling, MD       Or  . acetaminophen (TYLENOL) suppository 650 mg  650 mg Rectal Q6H PRN Pyreddy, Reatha Harps, MD      . apixaban (ELIQUIS) tablet 2.5 mg  2.5 mg Oral BID Baxter Hire, MD   2.5 mg at 10/14/16 1358  . diltiazem (CARDIZEM CD) 24 hr capsule 120 mg  120 mg Oral Daily Pyreddy, Pavan, MD   120 mg at 10/14/16 1359  . donepezil (ARICEPT) tablet 5 mg  5 mg Oral QHS Pyreddy, Pavan, MD      . folic acid (FOLVITE) tablet 1 mg  1 mg Oral Daily Pyreddy, Pavan, MD   1 mg at 10/14/16 1401  . guaiFENesin (MUCINEX) 12 hr tablet 600 mg  600 mg Oral BID PRN Saundra Shelling, MD      . HYDROcodone-acetaminophen (NORCO/VICODIN) 5-325 MG per tablet 1-2 tablet  1-2 tablet Oral Q4H PRN Hooten, Laurice Record, MD   1 tablet at 10/14/16 1517  . LORazepam (ATIVAN) tablet 1 mg  1 mg Oral Q6H PRN Saundra Shelling, MD       Or  . LORazepam (ATIVAN) injection 1 mg  1 mg Intravenous Q6H PRN Pyreddy, Pavan, MD      . LORazepam (ATIVAN) tablet 0-4 mg  0-4 mg Oral Q6H Saundra Shelling, MD   Stopped at 10/14/16 1700   Followed by  . [START ON 10/16/2016] LORazepam (ATIVAN) tablet 0-4 mg  0-4 mg Oral Q12H Pyreddy, Pavan, MD      . MEDLINE mouth rinse  15 mL Mouth Rinse BID Pyreddy, Pavan, MD   15 mL at 10/14/16 1402  .  metoprolol succinate (TOPROL-XL) 24 hr tablet 100 mg  100 mg  Oral Daily Pyreddy, Reatha Harps, MD   100 mg at 10/14/16 1406  . midodrine (PROAMATINE) tablet 10 mg  10 mg Oral TID Saundra Shelling, MD   10 mg at 10/14/16 1600  . multivitamin with minerals tablet 1 tablet  1 tablet Oral Daily Saundra Shelling, MD   1 tablet at 10/14/16 1401  . nitroGLYCERIN (NITROSTAT) SL tablet 0.4 mg  0.4 mg Sublingual Q5 min PRN Pyreddy, Reatha Harps, MD      . ondansetron (ZOFRAN) tablet 4 mg  4 mg Oral Q6H PRN Pyreddy, Reatha Harps, MD       Or  . ondansetron (ZOFRAN) injection 4 mg  4 mg Intravenous Q6H PRN Pyreddy, Pavan, MD      . pantoprazole (PROTONIX) EC tablet 40 mg  40 mg Oral Daily Pyreddy, Pavan, MD   40 mg at 10/14/16 1400  . protein supplement (PREMIER PROTEIN) liquid  11 oz Oral BID BM Baxter Hire, MD   11 oz at 10/14/16 1517  . senna-docusate (Senokot-S) tablet 1 tablet  1 tablet Oral QHS PRN Saundra Shelling, MD      . simvastatin (ZOCOR) tablet 20 mg  20 mg Oral QHS Pyreddy, Pavan, MD      . tamsulosin (FLOMAX) capsule 0.4 mg  0.4 mg Oral QPC supper Pyreddy, Reatha Harps, MD      . thiamine (VITAMIN B-1) tablet 100 mg  100 mg Oral Daily Pyreddy, Pavan, MD   100 mg at 10/14/16 1400  . torsemide (DEMADEX) tablet 20 mg  20 mg Oral BID PRN Baxter Hire, MD      . traMADol Veatrice Bourbon) tablet 50 mg  50 mg Oral Q12H PRN Saundra Shelling, MD        Musculoskeletal: Strength & Muscle Tone: within normal limits Gait & Station: unable to stand Patient leans: N/A  Psychiatric Specialty Exam: Physical Exam  Nursing note and vitals reviewed. Constitutional: He appears well-developed and well-nourished.  HENT:  Head: Normocephalic and atraumatic.  Eyes: Conjunctivae are normal. Pupils are equal, round, and reactive to light.  Neck: Normal range of motion.  Cardiovascular: Regular rhythm and normal heart sounds.   Respiratory: Effort normal. No respiratory distress.  GI: Soft.  Musculoskeletal: Normal range of motion.   Neurological: He is alert.  Skin: Skin is warm and dry.  Psychiatric: His affect is blunt. His speech is delayed. He is slowed. Thought content is not paranoid. Cognition and memory are normal. He expresses inappropriate judgment. He expresses no suicidal ideation.    Review of Systems  Constitutional: Positive for malaise/fatigue and weight loss.  HENT: Negative.   Eyes: Negative.   Respiratory: Negative.   Cardiovascular: Negative.   Gastrointestinal: Negative.   Musculoskeletal: Negative.   Skin: Negative.   Neurological: Positive for weakness.  Psychiatric/Behavioral: Positive for depression and substance abuse. Negative for hallucinations, memory loss and suicidal ideas. The patient is nervous/anxious and has insomnia.     Blood pressure 139/70, pulse 65, temperature 98.8 F (37.1 C), temperature source Oral, resp. rate 20, height 6' 1"  (1.854 m), weight 97.5 kg (215 lb), SpO2 98 %.Body mass index is 28.37 kg/m.  General Appearance: Casual  Eye Contact:  Minimal  Speech:  Slow  Volume:  Decreased  Mood:  Depressed  Affect:  Congruent  Thought Process:  Goal Directed  Orientation:  Full (Time, Place, and Person)  Thought Content:  Rumination  Suicidal Thoughts:  No  Homicidal Thoughts:  No  Memory:  Immediate;   Good Recent;   Fair Remote;  Fair  Judgement:  Impaired  Insight:  Shallow  Psychomotor Activity:  Decreased  Concentration:  Concentration: Fair  Recall:  [employer] of Knowledge:  Fair  Language:  Fair  Akathisia:  No  Handed:  Right  AIMS (if indicated):     Assets:  Financial Resources/Insurance Housing Resilience Social Support  ADL's:  Impaired  Cognition:  Impaired,  Mild  Sleep:        Treatment Plan Summary: Daily contact with patient to assess and evaluate symptoms and progress in treatment, Medication management and Plan 83 year old man with Maj. depression moderate. No sign of active suicidal intent no sign of psychosis. Also alcohol  abuse with a binge type pattern. Clearly worsening the depression and putting him at risk because of falls. Patient does not meet commitment criteria and does not require inpatient treatment. I spent some time educating him about the treatable nature of alcohol abuse and depression. Suggest putting him back on antidepressants which she is agreeable to. I will use mirtazapine and Lexapro which is what he was taking last year. Patient should be given information about local substance abuse treatment when he leaves the hospital. Based on his history and presentation he does not appear to probably need medical detox.  Disposition: Patient does not meet criteria for psychiatric inpatient admission. Supportive therapy provided about ongoing stressors. Discussed crisis plan, support from social network, calling 911, coming to the Emergency Department, and calling Suicide Hotline.  Alethia Berthold, MD 10/14/2016 4:54 PM

## 2016-10-14 NOTE — Evaluation (Signed)
Physical Therapy Evaluation Patient Details Name: [name] MRN: [id number] DOB: 11-10-1933 Today's Date: 10/14/2016   History of Present Illness  83 y/o male admitted on 10/13/16 for a L periprosthetic hip fracture (minimally displaced) s/p a fall. Pt fell in his kitchen after pt states he was "drinking too much". Pt's blood alcohol level was found to be .84 at ED. CWA protocol in place for alcohol abuse. PMH includes L THA with revision, AAA, nasal cancer, chronic a-fib, CAD, COPD, CKD stage III, diastolic CHF, PVD, HTN, HLD, tachy-brady syndrome, depression and pacemaker.     Clinical Impression  Pt is a pleasant 83 year old admitted for L periprosthetic hip fracture s/p a fall. Pt performs bed mobility and transfers with mod assistance. Pt able to perform sit to/from stand 2x with mod assist, RW, and elevated surface, however pt fatigued quickly from this exercise. Pt required heavy verbal/tactile cueing to maintain his TWB status on his LLE, which he had great difficulty maintaining. SAO2 on room air at rest: 93%. SAO2 on room air with exertion: dropped to 87%, but recovered to >90% within 2min of pursed lip breathing. Pt left on 1L O2 in room due to SOB after this session and SAO2 in low 90's. Did not progress to ambulation or transfer to chair this session due to pt's weakness/unsteadiness and inability to maintain his TWB status. Pt demonstrates deficits in weakness, functional mobility, and pain. Would benefit from skilled PT to address above deficits and promote optimal return to PLOF. Pt is motivated to participate in therapy. PT recommends dc to SNF given pt's current functional status well below his baseline.     Follow Up Recommendations SNF    Equipment Recommendations  None recommended by PT    Recommendations for Other Services       Precautions / Restrictions Precautions Precautions: Fall Restrictions Weight Bearing Restrictions: Yes LLE Weight Bearing: Touchdown weight  bearing      Mobility  Bed Mobility Overal bed mobility: Needs Assistance Bed Mobility: Supine to Sit     Supine to sit: Mod assist     General bed mobility comments: Heavy verbal cueing for sequencing and hand placement. Mod assist for pulling UE and managing LLE. No dizziness once EOB. Performed on room air: SAO2 remained above 90% after this transfer.  Transfers Overall transfer level: Needs assistance Equipment used: Rolling walker (2 wheeled) Transfers: Sit to/from Stand Sit to Stand: Mod assist;From elevated surface         General transfer comment: Sit to/from stand performed 2x with mod assist and verbal cueing for weight bearing precautions (TWB on LLE) and for hand placement. Pt unsteady on feet and unable to achieve fully upright position due to weakness and pain. Pt required mod-heavy verbal cueing to maintain TWB status. Performed on room air: SAO2 dropped to 87%, but recovered to 92% within 54min of pursed lip breathing.   Ambulation/Gait             General Gait Details: Did not assess due to pt's weakness and inability to maintain TWB status on LLE while standing.  Stairs            Wheelchair Mobility    Modified Rankin (Stroke Patients Only)       Balance Overall balance assessment: No apparent balance deficits (not formally assessed);History of Falls (good sitting balance. stood with BUE support on RW-fair.)  Pertinent Vitals/Pain Pain Assessment: Faces Faces Pain Scale: Hurts even more Pain Location: L hip Pain Descriptors / Indicators: Guarding;Grimacing;Discomfort Pain Intervention(s): Limited activity within patient's tolerance;Monitored during session;Repositioned;RN gave pain meds during session    [name] expects to be discharged to:: Private residence Living Arrangements: Spouse/significant other Available Help at Discharge: Family;Available 24  hours/day Type of Home: House Home Access: Ramped entrance     Home Layout: One level Home Equipment: Walker - 2 wheels;Cane - single point;Bedside commode;Tub bench Additional Comments: Pt's wife able to care for him 24/7    Prior Function Level of Independence: Independent with assistive device(s)         Comments: Ambulated with SPC within his home. Pt states he does not ambulate outside his home often.      Hand Dominance        Extremity/Trunk Assessment   Upper Extremity Assessment Upper Extremity Assessment: Generalized weakness (4/5 for grip and elbow flex/ext B)    Lower Extremity Assessment Lower Extremity Assessment: Generalized weakness (LLE not tested. RLE 4/5 for knee flex/ext, 4+/5 for DF/PF)       Communication   Communication: No difficulties  Cognition Arousal/Alertness: Awake/alert Behavior During Therapy: WFL for tasks assessed/performed Overall Cognitive Status: Within Functional Limits for tasks assessed                                        General Comments      Exercises Other Exercises Other Exercises: Supine ther-ex x10 included: ankle pumps, SLR's (RLE only), hip abd (RLE only), heel slide (RLE only), and quad set (LLE only). Performed with supervision. Performed on room air: SAO2 remained above 90% during ther-ex.   Assessment/Plan    PT Assessment Patient needs continued PT services  PT Problem List Decreased strength;Decreased activity tolerance;Decreased mobility;Pain       PT Treatment Interventions Gait training;Stair training;Therapeutic activities;Therapeutic exercise;Patient/family education    PT Goals (Current goals can be found in the Care Plan section)  Acute Rehab PT Goals Patient Stated Goal: to get stronger PT Goal Formulation: With patient Time For Goal Achievement: 10/28/16 Potential to Achieve Goals: Good    Frequency 7X/week   Barriers to discharge        Co-evaluation                AM-PAC PT "6 Clicks" Daily Activity  Outcome Measure Difficulty turning over in bed (including adjusting bedclothes, sheets and blankets)?: A Lot Difficulty moving from lying on back to sitting on the side of the bed? : Total Difficulty sitting down on and standing up from a chair with arms (e.g., wheelchair, bedside commode, etc,.)?: Total Help needed moving to and from a bed to chair (including a wheelchair)?: Total Help needed walking in hospital room?: Total Help needed climbing 3-5 steps with a railing? : Total 6 Click Score: 7    End of Session Equipment Utilized During Treatment: Gait belt Activity Tolerance: Patient limited by fatigue;Patient limited by pain Patient left: in bed;with bed alarm set;with call bell/phone within reach Nurse Communication: Mobility status PT Visit Diagnosis: Other abnormalities of gait and mobility (R26.89);Muscle weakness (generalized) (M62.81);Pain Pain - Right/Left: Left Pain - part of body: Hip    Time: 3704-8889 PT Time Calculation (min) (ACUTE ONLY): 31 min   Charges:   PT Evaluation $PT Eval Moderate Complexity: 1 Procedure PT Treatments $Therapeutic Exercise: 8-22 mins  PT G Codes:        Donaciano Eva, PT, SPT  Marni Griffon 10/14/2016, 4:21 PM

## 2016-10-14 NOTE — Progress Notes (Signed)
Subjective: Patient admitted with hip fracture last night. Currently has no complaints.  Objective: Vital signs in last 24 hours: Temp:  [97.4 F (36.3 C)-97.5 F (36.4 C)] 97.5 F (36.4 C) (07/06 0515) Pulse Rate:  [59-93] 77 (07/06 0515) Resp:  [15-22] 19 (07/06 0428) BP: (104-132)/(57-75) 132/68 (07/06 0515) SpO2:  [82 %-100 %] 99 % (07/06 0515) Weight:  [97.5 kg (215 lb)] 97.5 kg (215 lb) (07/05 2342) Weight change:     Intake/Output from previous day: 07/05 0701 - 07/06 0700 In: -  Out: 1 [Stool:1] Intake/Output this shift: Total I/O In: 360 [P.O.:360] Out: -   General appearance: no distress Resp: clear to auscultation bilaterally Cardio: irregularly irregular rhythm GI: soft, non-tender; bowel sounds normal; no masses,  no organomegaly  Lab Results:  Recent Labs  10/13/16 2357 10/14/16 0624  WBC 9.3 6.8  HGB 9.7* 9.4*  HCT 29.5* 28.8*  PLT 182 156   BMET  Recent Labs  10/13/16 2357 10/14/16 0624  NA 137 139  K 4.9 4.8  CL 105 108  CO2 23 22  GLUCOSE 91 82  BUN 46* 42*  CREATININE 4.34* 4.01*  CALCIUM 8.4* 8.4*    Studies/Results: Ct Hip Left Wo Contrast  Result Date: 10/14/2016 CLINICAL DATA:  Left hip pain after fall. EXAM: CT OF THE LEFT HIP WITHOUT CONTRAST TECHNIQUE: Multidetector CT imaging of the left hip was performed according to the standard protocol. Multiplanar CT image reconstructions were also generated. COMPARISON:  Radiographs earlier this day. FINDINGS: Bones/Joint/Cartilage Left hip arthroplasty with cerclage wire fixation. Acute nondisplaced fracture of the subtrochanteric femur just below the intertrochanteric region. Fracture may abut the femoral stem. There is heterotopic calcification about the greater trochanter superiorly. No additional fracture of the left hip or pelvis. Pubic rami are intact. No visualize sacral fracture. Left sacroiliac joint and pubic symphysis are congruent were visualized. Ligaments Suboptimally assessed  by CT. Muscles and Tendons No evidence of intramuscular hematoma. Soft tissues Soft tissue density adjacent is cerclage wire extending to skin may be postsurgical change or hematoma. Fat within both inguinal canals. Colonic diverticulosis is incidentally noted. Atherosclerotic calcifications of pelvic vasculature. IMPRESSION: Left hip arthroplasty with acute minimally displaced periprosthetic fracture in the subtrochanteric region. Electronically Signed   By: Jeb Levering M.D.   On: 10/14/2016 02:33   Dg Chest Port 1 View  Result Date: 10/14/2016 CLINICAL DATA:  Fall EXAM: PORTABLE CHEST 1 VIEW COMPARISON:  Chest radiograph 07/22/2016 FINDINGS: Cardiomediastinal silhouette remains enlarged. Single lead left chest wall pacemaker is unchanged. There are findings of prior CABG. Calcified granuloma at the left lung base. No focal consolidation or pulmonary edema. IMPRESSION: No active disease. Electronically Signed   By: Ulyses Jarred M.D.   On: 10/14/2016 00:37   Dg Hip Unilat With Pelvis 2-3 Views Left  Result Date: 10/14/2016 CLINICAL DATA:  Left hip pain after fall from standing in the kitchen. EXAM: DG HIP (WITH OR WITHOUT PELVIS) 2-3V LEFT COMPARISON:  Left hip radiograph 02/19/2007 FINDINGS: Left hip arthroplasty in expected alignment with cerclage wire. Mild heterotopic calcification adjacent to the greater trochanter. Cortical irregularity laterally about the intertrochanteric region is unchanged from remote prior exam. No evidence of acute or periprosthetic fracture. No periprosthetic lucency. Pubic rami are intact. Pubic symphysis and sacroiliac joints are congruent. IMPRESSION: Left hip arthroplasty in place without evidence of acute or periprosthetic fracture. Mild heterotopic calcification adjacent to the greater trochanter. Electronically Signed   By: Jeb Levering M.D.   On: 10/14/2016 00:37  Medications: I have reviewed the patient's current medications.  Assessment/Plan: 1. Hip  fracture. Patient has been evaluated by orthopedics. They concluded this is a nonoperative fracture. This recommend physical therapy. PT will be evaluating and he will probably need rehabilitation services. 2. Atrial fibrillation. Rate is controlled. He has a pacemaker. He said he is getting frequent evaluations now at Dr. Candis Musa office because of batteries getting low and was coming earlier today. I will discuss this with Dr. Candis Musa as this may need to be done before he goes to rehabilitation. Also continue his anticoagulation. 3. Alcohol abuse. He is on the CWA protocol. Also psychiatry has been consulted to see him about potential depression contributing to this. 4. History of diastolic congestive heart failure. He appears to be euvolemic. We'll stop IV fluids.  Total time spent 20 minutes  LOS: 0 days   Baxter Hire 10/14/2016, 10:24 AM

## 2016-10-14 NOTE — ED Notes (Signed)
Pt used urinal  

## 2016-10-14 NOTE — Care Management Note (Signed)
Case Management Note  Patient Details  Name: JOAQUIM TOLEN MRN: [id number] Date of Birth: July 16, 1933  Subjective/Objective: ETOH. Fall at home where he lives with his spouse. Fractured left prothesis. Ortho consult pending. Following.                    Action/Plan:   Expected Discharge Date:  10/17/16               Expected Discharge Plan:     In-House Referral:     Discharge planning Services  CM Consult  Post Acute Care Choice:    Choice offered to:     DME Arranged:    DME Agency:     HH Arranged:    HH Agency:     Status of Service:  In process, will continue to follow  If discussed at Long Length of Stay Meetings, dates discussed:    Additional Comments:  Jolly Mango, RN 10/14/2016, 9:53 AM

## 2016-10-15 LAB — BLOOD GAS, ARTERIAL
ACID-BASE DEFICIT: 4.4 mmol/L — AB (ref 0.0–2.0)
Acid-base deficit: 4.3 mmol/L — ABNORMAL HIGH (ref 0.0–2.0)
Allens test (pass/fail): POSITIVE — AB
BICARBONATE: 23.7 mmol/L (ref 20.0–28.0)
Bicarbonate: 24.6 mmol/L (ref 20.0–28.0)
DELIVERY SYSTEMS: POSITIVE
EXPIRATORY PAP: 6
FIO2: 0.28
FIO2: 0.28
Inspiratory PAP: 16
O2 SAT: 96.6 %
O2 SAT: 91.3 %
PATIENT TEMPERATURE: 37
PATIENT TEMPERATURE: 37
PH ART: 7.22 — AB (ref 7.350–7.450)
RATE: 8 resp/min
pCO2 arterial: 66 mmHg (ref 32.0–48.0)
pCO2 arterial: 58 mmHg — ABNORMAL HIGH (ref 32.0–48.0)
pH, Arterial: 7.18 — CL (ref 7.350–7.450)
pO2, Arterial: 106 mmHg (ref 83.0–108.0)
pO2, Arterial: 74 mmHg — ABNORMAL LOW (ref 83.0–108.0)

## 2016-10-15 LAB — BASIC METABOLIC PANEL
ANION GAP: 7 (ref 5–15)
Anion gap: 8 (ref 5–15)
Anion gap: 9 (ref 5–15)
BUN: 48 mg/dL — AB (ref 6–20)
BUN: 47 mg/dL — AB (ref 6–20)
BUN: 47 mg/dL — AB (ref 6–20)
CALCIUM: 8.7 mg/dL — AB (ref 8.9–10.3)
CHLORIDE: 107 mmol/L (ref 101–111)
CHLORIDE: 109 mmol/L (ref 101–111)
CO2: 25 mmol/L (ref 22–32)
CO2: 24 mmol/L (ref 22–32)
CO2: 22 mmol/L (ref 22–32)
CREATININE: 3.75 mg/dL — AB (ref 0.61–1.24)
Calcium: 8.7 mg/dL — ABNORMAL LOW (ref 8.9–10.3)
Calcium: 8.9 mg/dL (ref 8.9–10.3)
Chloride: 106 mmol/L (ref 101–111)
Creatinine, Ser: 3.83 mg/dL — ABNORMAL HIGH (ref 0.61–1.24)
Creatinine, Ser: 3.75 mg/dL — ABNORMAL HIGH (ref 0.61–1.24)
GFR calc Af Amer: 16 mL/min — ABNORMAL LOW (ref >60)
GFR calc Af Amer: 16 mL/min — ABNORMAL LOW (ref >60)
GFR calc non Af Amer: 14 mL/min — ABNORMAL LOW (ref >60)
GFR, EST AFRICAN AMERICAN: 16 mL/min — AB (ref >60)
GFR, EST NON AFRICAN AMERICAN: 13 mL/min — AB (ref >60)
GFR, EST NON AFRICAN AMERICAN: 14 mL/min — AB (ref >60)
GLUCOSE: 127 mg/dL — AB (ref 65–99)
Glucose, Bld: 131 mg/dL — ABNORMAL HIGH (ref 65–99)
Glucose, Bld: 119 mg/dL — ABNORMAL HIGH (ref 65–99)
POTASSIUM: 5.2 mmol/L — AB (ref 3.5–5.1)
Potassium: 5.8 mmol/L — ABNORMAL HIGH (ref 3.5–5.1)
Potassium: 5.7 mmol/L — ABNORMAL HIGH (ref 3.5–5.1)
SODIUM: 139 mmol/L (ref 135–145)
SODIUM: 138 mmol/L (ref 135–145)
Sodium: 140 mmol/L (ref 135–145)

## 2016-10-15 LAB — TROPONIN I
TROPONIN I: 0.03 ng/mL — AB (ref <0.03)
Troponin I: <0.03 ng/mL (ref <0.03)

## 2016-10-15 LAB — MAGNESIUM: Magnesium: 2 mg/dL (ref 1.7–2.4)

## 2016-10-15 LAB — MRSA PCR SCREENING: MRSA BY PCR: NEGATIVE

## 2016-10-15 LAB — PHOSPHORUS: Phosphorus: 5.3 mg/dL — ABNORMAL HIGH (ref 2.5–4.6)

## 2016-10-15 LAB — GLUCOSE, CAPILLARY: GLUCOSE-CAPILLARY: 115 mg/dL — AB (ref 65–99)

## 2016-10-15 MED ORDER — NALOXONE HCL 0.4 MG/ML IJ SOLN
0.4000 mg | Freq: Once | INTRAMUSCULAR | Status: AC
  Administered 2016-10-15: 0.4 mg via INTRAVENOUS
  Filled 2016-10-15: qty 1

## 2016-10-15 MED ORDER — MUPIROCIN 2 % EX OINT
1.0000 "application " | TOPICAL_OINTMENT | Freq: Two times a day (BID) | CUTANEOUS | Status: DC
  Administered 2016-10-16 – 2016-10-19 (×6): 1 via NASAL
  Filled 2016-10-15 (×2): qty 22

## 2016-10-15 MED ORDER — CHLORHEXIDINE GLUCONATE CLOTH 2 % EX PADS
6.0000 | MEDICATED_PAD | Freq: Every day | CUTANEOUS | Status: DC
  Administered 2016-10-16 – 2016-10-18 (×2): 6 via TOPICAL

## 2016-10-15 MED ORDER — NALOXONE HCL 0.4 MG/ML IJ SOLN
0.4000 mg | INTRAMUSCULAR | Status: AC
Start: 2016-10-15 — End: 2016-10-15
  Administered 2016-10-15: 0.4 mg via INTRAVENOUS

## 2016-10-15 MED ORDER — METHYLPREDNISOLONE SODIUM SUCC 125 MG IJ SOLR
60.0000 mg | Freq: Four times a day (QID) | INTRAMUSCULAR | Status: DC
  Administered 2016-10-15 – 2016-10-17 (×7): 60 mg via INTRAVENOUS
  Filled 2016-10-15 (×7): qty 2

## 2016-10-15 MED ORDER — NALOXONE HCL 0.4 MG/ML IJ SOLN
INTRAMUSCULAR | Status: AC
  Administered 2016-10-15: 0.4 mg via INTRAVENOUS
  Filled 2016-10-15: qty 1

## 2016-10-15 MED ORDER — IPRATROPIUM-ALBUTEROL 0.5-2.5 (3) MG/3ML IN SOLN
3.0000 mL | Freq: Four times a day (QID) | RESPIRATORY_TRACT | Status: DC
  Administered 2016-10-15 – 2016-10-19 (×17): 3 mL via RESPIRATORY_TRACT
  Filled 2016-10-15 (×17): qty 3

## 2016-10-15 MED ORDER — SODIUM CHLORIDE 0.9 % IV SOLN
INTRAVENOUS | Status: DC
  Administered 2016-10-15: 12:00:00 via INTRAVENOUS

## 2016-10-15 MED ORDER — SODIUM CHLORIDE 0.9 % IV BOLUS (SEPSIS)
1000.0000 mL | Freq: Once | INTRAVENOUS | Status: AC
  Administered 2016-10-15: 1000 mL via INTRAVENOUS

## 2016-10-15 NOTE — Progress Notes (Signed)
Patient ph 7.18, MD notified. MD states he isn't too concerned with CO2 but more concerned with the [place] level. MD states he will place orders.

## 2016-10-15 NOTE — Progress Notes (Signed)
MD and nurse rounding on patient. Patient lethargic, arousable but falls asleep quickly, Narcan ordered.

## 2016-10-15 NOTE — Clinical Social Work Note (Signed)
Clinical Social Work Assessment  Patient Details  Name: [name] MRN: [id number] Date of Birth: 13-Jun-1933  Date of referral:  10/15/16               Reason for consult:  Substance Use/ETOH Abuse, Facility Placement                Permission sought to share information with:  Chartered certified accountant granted to share information::  Yes, Verbal Permission Granted  Name::        Agency::     Relationship::     Contact Information:     Housing/Transportation Living arrangements for the past 2 months:  Single Family Home Source of Information:  Spouse Patient Interpreter Needed:  None Criminal Activity/Legal Involvement Pertinent to Current Situation/Hospitalization:  No - Comment as needed Significant Relationships:  Siblings, Spouse Lives with:  Spouse Do you feel safe going back to the place where you live?  Yes Need for family participation in patient care:  No (Coment)  Care giving concerns: PT recommendation for STR/CIWA protocol   Social Worker assessment / plan:  CSW met with the patient, his spouse, and his sister at bedside to discuss discharge planning. The patient was severely lethargic and did not participate. The patient's spouse gave verbal permission to conduct the CSW referral. The CSW explained the referral process as well as the insurance process.   At baseline, the client seems to use/abuse alcohol daily. The patient's spouse indicates that he refuses to seek assistance with his alcohol use. The patient has a walker that he refuses to use, but he will use a cane. The client eats a regular diet; however, his appetite has been limited. The CSW advised the family of effects of alcohol use and detoxification as well as provided emotional support.   The patient's discharge date is unknown at this time due to his continued issues with significant lethargy. CSW will present bed offers as they become available.  Employment status:  Retired Designer, industrial/product PT Recommendations:  [place] / Referral to community resources:  [place]  Patient/Family's Response to care:  The family thanked the CSW for assistance.  Patient/Family's Understanding of and Emotional Response to Diagnosis, Current Treatment, and Prognosis:  The patient was unable to participate due to lethargy. The family understands the need for STR and are in agreement.   Emotional Assessment Appearance:  Appears stated age Attitude/Demeanor/Rapport:  Lethargic Affect (typically observed):  Withdrawn Orientation:   (Too lethargic to assess) Alcohol / Substance use:  Alcohol Use (CIWA protocol) Psych involvement (Current and /or in the community):  Yes (Comment) (Seen by Dr. Weber Cooks concerning ETOH abuse)  Discharge Needs  Concerns to be addressed:  Care Coordination, Substance Abuse Concerns, Discharge Planning Concerns Readmission within the last 30 days:  No Current discharge risk:  Chronically ill, Substance Abuse Barriers to Discharge:  Continued Medical Work up   [employer], LCSW 10/15/2016, 2:18 PM

## 2016-10-15 NOTE — Clinical Social Work Placement (Signed)
   CLINICAL SOCIAL WORK PLACEMENT  NOTE  Date:  10/15/2016  Patient Details  Name: [name] MRN: [id number] Date of Birth: 10/08/1933  Clinical Social Work is seeking post-discharge placement for this patient at the [place] level of care (*CSW will initial, date and re-position this form in  chart as items are completed):  Yes   Patient/family provided with [facility] Work Department's list of facilities offering this level of care within the geographic area requested by the patient (or if unable, by the patient's family).  Yes   Patient/family informed of their freedom to choose among providers that offer the needed level of care, that participate in Medicare, Medicaid or managed care program needed by the patient, have an available bed and are willing to accept the patient.  Yes   Patient/family informed of [facility]'s ownership interest in [facility] and [facility], as well as of the fact that they are under no obligation to receive care at these facilities.  PASRR submitted to EDS on       PASRR number received on       Existing PASRR number confirmed on 10/15/16     FL2 transmitted to all facilities in geographic area requested by pt/family on 10/15/16     FL2 transmitted to all facilities within larger geographic area on       Patient informed that his/her managed care company has contracts with or will negotiate with certain facilities, including the following:            Patient/family informed of bed offers received.  Patient chooses bed at       Physician recommends and patient chooses bed at      Patient to be transferred to   on  .  Patient to be transferred to facility by       Patient family notified on   of transfer.  Name of family member notified:        PHYSICIAN       Additional Comment:    _______________________________________________ Zettie Pho, LCSW 10/15/2016, 2:25 PM

## 2016-10-15 NOTE — Progress Notes (Signed)
Patient is still very lethargic. O2 at 2L. Respiratory called to come assess patient's breathing, possible abg's. Patient is confused. Continue to monitor. Wife at bedside.

## 2016-10-15 NOTE — Progress Notes (Signed)
Pts. BUN creatinine and potassium elevated. Dr. Estanislado Pandy notified and gave no new orders.

## 2016-10-15 NOTE — Progress Notes (Signed)
Patient ID: [name], male   DOB: 07-04-1933, 83 y.o.   MRN: [id number]  [facility] [employer] PROGRESS NOTE  [name] TOI:[id number] DOB: July 20, 1933 DOA: 10/13/2016 PCP: [place] Pink, MD  HPI/Subjective: Patient seen this morning and needed to be stimulated quite hard in order to wake up. I did give him a dose of Narcan earlier today and he seemed to wake up and answer some questions and he introduced me to family members that were in the room. He became lethargic again and blood gas showing a pH of 7.18 and a high PCO2 of 66 and patient will need closer monitoring in the ICU.  Objective: Vitals:   10/15/16 1130 10/15/16 1331  BP: 139/65   Pulse: 60 (!) 59  Resp: 17 17  Temp: 97.8 F (36.6 C) 97.9 F (36.6 C)    Filed Weights   10/13/16 2342  Weight: 97.5 kg (215 lb)    ROS: Review of Systems  Unable to perform ROS: Acuity of condition  Respiratory: Negative for shortness of breath.   Cardiovascular: Negative for chest pain.  Gastrointestinal: Negative for abdominal pain.   limited with lethargy Exam: Physical Exam  Constitutional: He appears lethargic.  HENT:  Nose: No mucosal edema.  Mouth/Throat: No oropharyngeal exudate or posterior oropharyngeal edema.  Eyes: Conjunctivae and lids are normal. Pupils are equal, round, and reactive to light.  Neck: No JVD present. Carotid bruit is not present. No edema present. No thyroid mass and no thyromegaly present.  Cardiovascular: S1 normal, S2 normal and normal heart sounds.  Exam reveals no gallop.   No murmur heard. Pulses:      Dorsalis pedis pulses are 2+ on the right side, and 2+ on the left side.  Respiratory: No respiratory distress. He has decreased breath sounds in the right lower field and the left lower field. He has no wheezes. He has no rhonchi. He has no rales.  GI: Soft. Bowel sounds are normal. There is no tenderness.  Musculoskeletal:       Right ankle: He exhibits swelling.       Left ankle: He  exhibits swelling.  Lymphadenopathy:    He has no cervical adenopathy.  Neurological: He appears lethargic.  Skin: Skin is warm. No rash noted. Nails show no clubbing.  Psychiatric:  Lethargic but was able to answer a few questions this morning      Data Reviewed: Basic Metabolic Panel:  Recent Labs Lab 10/13/16 2357 10/14/16 0624 10/15/16 0339  NA 137 139 139  K 4.9 4.8 5.2*  CL 105 108 107  CO2 23 22 25   GLUCOSE 91 82 131*  BUN 46* 42* 48*  CREATININE 4.34* 4.01* 3.83*  CALCIUM 8.4* 8.4* 8.7*   CBC:  Recent Labs Lab 10/13/16 2357 10/14/16 0624  WBC 9.3 6.8  HGB 9.7* 9.4*  HCT 29.5* 28.8*  MCV 92.2 92.3  PLT 182 156   Cardiac Enzymes:  Recent Labs Lab 10/13/16 2357  TROPONINI 0.03*    CBG:  Recent Labs Lab 10/15/16 1149  GLUCAP 115*    Recent Results (from the past 240 hour(s))  Surgical PCR screen     Status: Abnormal   Collection Time: 10/14/16  5:25 AM  Result Value Ref Range Status   MRSA, PCR NEGATIVE NEGATIVE Final   Staphylococcus aureus POSITIVE (A) NEGATIVE Final    Comment:        The Xpert SA Assay (FDA approved for NASAL specimens in patients over 82 years of age),  is one component of a comprehensive surveillance program.  Test performance has been validated by [facility] for patients greater than or equal to 58 year old. It is not intended to diagnose infection nor to guide or monitor treatment.      Studies: Ct Hip Left Wo Contrast  Result Date: 10/14/2016 CLINICAL DATA:  Left hip pain after fall. EXAM: CT OF THE LEFT HIP WITHOUT CONTRAST TECHNIQUE: Multidetector CT imaging of the left hip was performed according to the standard protocol. Multiplanar CT image reconstructions were also generated. COMPARISON:  Radiographs earlier this day. FINDINGS: Bones/Joint/Cartilage Left hip arthroplasty with cerclage wire fixation. Acute nondisplaced fracture of the subtrochanteric femur just below the intertrochanteric region.  Fracture may abut the femoral stem. There is heterotopic calcification about the greater trochanter superiorly. No additional fracture of the left hip or pelvis. Pubic rami are intact. No visualize sacral fracture. Left sacroiliac joint and pubic symphysis are congruent were visualized. Ligaments Suboptimally assessed by CT. Muscles and Tendons No evidence of intramuscular hematoma. Soft tissues Soft tissue density adjacent is cerclage wire extending to skin may be postsurgical change or hematoma. Fat within both inguinal canals. Colonic diverticulosis is incidentally noted. Atherosclerotic calcifications of pelvic vasculature. IMPRESSION: Left hip arthroplasty with acute minimally displaced periprosthetic fracture in the subtrochanteric region. Electronically Signed   By: Jeb Levering M.D.   On: 10/14/2016 02:33   Dg Chest Port 1 View  Result Date: 10/14/2016 CLINICAL DATA:  Fall EXAM: PORTABLE CHEST 1 VIEW COMPARISON:  Chest radiograph 07/22/2016 FINDINGS: Cardiomediastinal silhouette remains enlarged. Single lead left chest wall pacemaker is unchanged. There are findings of prior CABG. Calcified granuloma at the left lung base. No focal consolidation or pulmonary edema. IMPRESSION: No active disease. Electronically Signed   By: Ulyses Jarred M.D.   On: 10/14/2016 00:37   Dg Hip Unilat With Pelvis 2-3 Views Left  Result Date: 10/14/2016 CLINICAL DATA:  Left hip pain after fall from standing in the kitchen. EXAM: DG HIP (WITH OR WITHOUT PELVIS) 2-3V LEFT COMPARISON:  Left hip radiograph 02/19/2007 FINDINGS: Left hip arthroplasty in expected alignment with cerclage wire. Mild heterotopic calcification adjacent to the greater trochanter. Cortical irregularity laterally about the intertrochanteric region is unchanged from remote prior exam. No evidence of acute or periprosthetic fracture. No periprosthetic lucency. Pubic rami are intact. Pubic symphysis and sacroiliac joints are congruent. IMPRESSION: Left  hip arthroplasty in place without evidence of acute or periprosthetic fracture. Mild heterotopic calcification adjacent to the greater trochanter. Electronically Signed   By: Jeb Levering M.D.   On: 10/14/2016 00:37    Scheduled Meds: . apixaban  2.5 mg Oral BID  . Chlorhexidine Gluconate Cloth  6 each Topical Daily  . diltiazem  120 mg Oral Daily  . donepezil  5 mg Oral QHS  . escitalopram  10 mg Oral Daily  . folic acid  1 mg Oral Daily  . LORazepam  0-4 mg Oral Q6H   Followed by  . [START ON 10/16/2016] LORazepam  0-4 mg Oral Q12H  . mouth rinse  15 mL Mouth Rinse BID  . metoprolol succinate  100 mg Oral Daily  . midodrine  10 mg Oral TID  . mirtazapine  15 mg Oral QHS  . multivitamin with minerals  1 tablet Oral Daily  . mupirocin ointment  1 application Nasal BID  . naLOXone (NARCAN)  injection  0.4 mg Intravenous Once  . pantoprazole  40 mg Oral Daily  . protein supplement shake  11  oz Oral BID BM  . simvastatin  20 mg Oral QHS  . tamsulosin  0.4 mg Oral QPC supper  . thiamine  100 mg Oral Daily   Continuous Infusions: . sodium chloride 50 mL/hr at 10/15/16 1212    Assessment/Plan:  1. Acute respiratory acidosis with high PCO2. Acute encephalopathy. Transfer to the ICU for closer monitoring and BiPAP. Case discussed with critical care specialist. I gave a dose of Narcan earlier and I will give another dose now. Consider CT scan of the head if no improvement. 2. Alcohol abuse. Patient on CIWA protocol. May be a candidate for Precedex drip. Continue thiamine, folic acid and multivitamin 3. Acute kidney injury on chronic kidney disease stage IV. IV fluid hydration started this morning 4. Hyperkalemia. Hopefully IV fluids will improve. Recheck a BMP this afternoon 5. BPH on Flomax 6. Chronic atrial fibrillation- Eliquis, diltiazem and metoprolol  Code Status:     Code Status Orders        Start     Ordered   10/14/16 0433  Full code  Continuous     10/14/16 0432     Code Status History    Date Active Date Inactive Code Status Order ID Comments User Context   11/24/2015  5:27 PM 11/26/2015  8:13 PM Full Code [id number]  Epifanio Lesches, MD ED   01/08/2015 11:16 PM 01/10/2015  8:35 PM Full Code [id number]  Earnestine Leys, MD Inpatient   01/07/2015  3:06 PM 01/08/2015 11:16 PM Full Code [id number]  Earnestine Leys, MD ED   08/22/2014  8:47 PM 08/25/2014  5:54 PM Full Code [id number]  Dustin Flock, MD ED    Advance Directive Documentation     Most Recent Value  Type of Advance Directive  Living will  Pre-existing out of facility DNR order (yellow form or pink MOST form)  -  "MOST" Form in Place?  -     Family Communication: Spoke with wife earlier today and this afternoon Disposition Plan: Transfer to CCU stepdown  Consultants:  Critical care specialist  Orthopedic surgery  Time spent: 35 minutes. Patient transferred to the critical care unit for closer monitoring of respiratory status.  Loletha Grayer  [employer]

## 2016-10-15 NOTE — Progress Notes (Signed)
Patient alert but still drowsy. Sister and wife at bedside. Continue to monitor.

## 2016-10-15 NOTE — Progress Notes (Signed)
ABG drawn and results called to physician.  Orders placed for patient to be placed on BIPAP while in room 144 and when bed is available patient will be transferred to ICU on BIPAP.

## 2016-10-15 NOTE — NC FL2 (Signed)
[place] LEVEL OF CARE SCREENING TOOL     IDENTIFICATION  Patient Name: [name] Birthdate: Jun 16, 1933 Sex: male Admission Date (Current Location): 10/13/2016  [place] and [place] Number:  Engineering geologist and Address:  [facility], [address] [place], [place] [address]      Provider Number: [id number]  Attending Physician Name and Address:  Loletha Grayer, MD  Relative Name and Phone Number:       Current Level of Care: Hospital Recommended Level of Care: Story Prior Approval Number:    Date Approved/Denied:   PASRR Number: [id number]  Discharge Plan: SNF    Current Diagnoses: Patient Active Problem List   Diagnosis Date Noted  . Hip fracture ([place]) 10/14/2016  . Moderate recurrent major depression ([place]) 10/14/2016  . Syncope 11/24/2015  . Depression, major, single episode, severe ([place]) 08/18/2015  . Alcohol abuse 08/18/2015  . Suicidal ideation 08/18/2015  . Bimalleolar fracture of left ankle 01/07/2015  . COPD exacerbation ([place]) 08/22/2014  . Coronary artery disease   . Chronic atrial fibrillation ([place])   . Chronic diastolic CHF (congestive heart failure) ([place])   . Tachy-brady syndrome ([place])   . CKD (chronic kidney disease), stage III 04/09/2012  . Chronic diastolic heart failure ([place]) 01/26/2012  . Chest pain 09/23/2010  . Hyperlipidemia 06/12/2009  . Essential hypertension 06/12/2009  . Coronary atherosclerosis of artery bypass graft 06/12/2009  . ATRIAL FIBRILLATION 06/12/2009  . AAA 06/12/2009  . COPD 06/12/2009  . GERD 06/12/2009  . PACEMAKER, PERMANENT 06/12/2009    Orientation RESPIRATION BLADDER Height & Weight     Self, Place  O2 (2L o2) Continent Weight: 215 lb (97.5 kg) Height:  6\' 1"  (185.4 cm)  BEHAVIORAL SYMPTOMS/MOOD NEUROLOGICAL BOWEL NUTRITION STATUS      Continent    AMBULATORY STATUS COMMUNICATION OF NEEDS Skin   Extensive Assist Verbally Normal                       Personal Care Assistance Level of Assistance  Bathing, Feeding, Dressing Bathing Assistance: Maximum assistance Feeding assistance: Limited assistance Dressing Assistance: Maximum assistance     Functional Limitations Info             SPECIAL CARE FACTORS FREQUENCY  PT (By licensed PT)     PT Frequency: Up to 5X per day, 5 days per week.              Contractures      Additional Factors Info  Allergies   Allergies Info: Atorvastatin           Current Medications (10/15/2016):  This is the current hospital active medication list Current Facility-Administered Medications  Medication Dose Route Frequency Provider Last Rate Last Dose  . 0.9 %  sodium chloride infusion   Intravenous Continuous Loletha Grayer, MD 50 mL/hr at 10/15/16 1212    . acetaminophen (TYLENOL) tablet 650 mg  650 mg Oral Q6H PRN Saundra Shelling, MD       Or  . acetaminophen (TYLENOL) suppository 650 mg  650 mg Rectal Q6H PRN Pyreddy, Reatha Harps, MD      . apixaban (ELIQUIS) tablet 2.5 mg  2.5 mg Oral BID Baxter Hire, MD   2.5 mg at 10/15/16 8101  . Chlorhexidine Gluconate Cloth 2 % PADS 6 each  6 each Topical Daily Wieting, Richard, MD      . diltiazem (CARDIZEM CD) 24 hr capsule 120 mg  120  mg Oral Daily Saundra Shelling, MD   120 mg at 10/15/16 0828  . donepezil (ARICEPT) tablet 5 mg  5 mg Oral QHS Saundra Shelling, MD   5 mg at 10/14/16 2149  . escitalopram (LEXAPRO) tablet 10 mg  10 mg Oral Daily Clapacs, Madie Reno, MD   10 mg at 10/15/16 1157  . folic acid (FOLVITE) tablet 1 mg  1 mg Oral Daily Pyreddy, Reatha Harps, MD   1 mg at 10/15/16 0828  . guaiFENesin (MUCINEX) 12 hr tablet 600 mg  600 mg Oral BID PRN Pyreddy, Reatha Harps, MD      . LORazepam (ATIVAN) tablet 1 mg  1 mg Oral Q6H PRN Saundra Shelling, MD       Or  . LORazepam (ATIVAN) injection 1 mg  1 mg Intravenous Q6H PRN Pyreddy, Pavan, MD      . LORazepam (ATIVAN) tablet 0-4 mg  0-4 mg Oral Q6H Saundra Shelling, MD   Stopped at 10/14/16  1700   Followed by  . [START ON 10/16/2016] LORazepam (ATIVAN) tablet 0-4 mg  0-4 mg Oral Q12H Pyreddy, Pavan, MD      . MEDLINE mouth rinse  15 mL Mouth Rinse BID Saundra Shelling, MD   15 mL at 10/15/16 0827  . metoprolol succinate (TOPROL-XL) 24 hr tablet 100 mg  100 mg Oral Daily Pyreddy, Reatha Harps, MD   100 mg at 10/15/16 0828  . midodrine (PROAMATINE) tablet 10 mg  10 mg Oral TID Saundra Shelling, MD   10 mg at 10/15/16 2620  . mirtazapine (REMERON) tablet 15 mg  15 mg Oral QHS Clapacs, Madie Reno, MD   15 mg at 10/14/16 2148  . multivitamin with minerals tablet 1 tablet  1 tablet Oral Daily Saundra Shelling, MD   1 tablet at 10/15/16 0827  . mupirocin ointment (BACTROBAN) 2 % 1 application  1 application Nasal BID Wieting, Richard, MD      . naloxone [facility]) injection 0.4 mg  0.4 mg Intravenous Once Loletha Grayer, MD      . nitroGLYCERIN (NITROSTAT) SL tablet 0.4 mg  0.4 mg Sublingual Q5 min PRN Pyreddy, Reatha Harps, MD      . ondansetron (ZOFRAN) tablet 4 mg  4 mg Oral Q6H PRN Pyreddy, Reatha Harps, MD       Or  . ondansetron (ZOFRAN) injection 4 mg  4 mg Intravenous Q6H PRN Pyreddy, Pavan, MD      . pantoprazole (PROTONIX) EC tablet 40 mg  40 mg Oral Daily Pyreddy, Reatha Harps, MD   40 mg at 10/15/16 0828  . protein supplement (PREMIER PROTEIN) liquid  11 oz Oral BID BM Baxter Hire, MD   11 oz at 10/15/16 1000  . senna-docusate (Senokot-S) tablet 1 tablet  1 tablet Oral QHS PRN Saundra Shelling, MD      . simvastatin (ZOCOR) tablet 20 mg  20 mg Oral QHS Saundra Shelling, MD   20 mg at 10/14/16 2148  . tamsulosin (FLOMAX) capsule 0.4 mg  0.4 mg Oral QPC supper Saundra Shelling, MD   0.4 mg at 10/14/16 1753  . thiamine (VITAMIN B-1) tablet 100 mg  100 mg Oral Daily Pyreddy, Reatha Harps, MD   100 mg at 10/15/16 3559     Discharge Medications: Please see discharge summary for a list of discharge medications.  Relevant Imaging Results:  Relevant Lab Results:   Additional Information SS# [id number]  Zettie Pho,  LCSW

## 2016-10-15 NOTE — Consult Note (Signed)
[facility] Consultation    SYNOPSIS   83 y.o. male with a known history of Abdominal aortic aneurysm , nasal cancer, chronic atrial fibrillation systemic anticoagulation, coronary artery disease, COPD, chronic kidney disease stage III, diastolic heart failure, peripheral vascular disease presented to the emergency room with left hip pain. Patient drank alcohol and lost balance and fell down at home.  Patient had hip surgery on the left side and has a prosthesis in the past. Imaging studies today revealed fracture of the prosthesis. Orthopedic surgery was consulted by ER physician.   ASSESSMENT/PLAN   Hypercapnic respiratory failure. Patient with underlying history of COPD. Felt initially to be due to pain medication however given Narcan and was given early this morning. Brought up to the intensive care unit on noninvasive ventilation, patient with a component of bronchospasm, will add Solu-Medrol, albuterol, Atrovent and repeat arterial blood gas, pending chest x-ray  Underlying history of coronary artery disease. Will obtain EKG, serial cardiac enzymes  Renal failure. Baseline creatinine is 2.16, acute on chronic with creatinine now 3.83  Hyperkalemia. Will repeat BMP and shift if potassium is elevated  History of tachybradycardia syndrome with history of pacemaker  Anemia, no evidence of active bleeding     Name: [name] MRN: [id number] DOB: [birth year]    ADMISSION DATE:  10/13/2016 CONSULTATION DATE:  10/15/2016  REFERRING MD :  Hospitalist service  CHIEF COMPLAINT:  Somnolence with hypercapnic respiratory failure requiring BiPAP   HISTORY OF PRESENT ILLNESS:  [name] is an 83 year old gentleman with a past medical history remarkable for abdominal aortic aneurysm, chronic atrial fibrillation, diastolic heart failure, COPD, chronic renal insufficiency, coronary artery disease, gastroesophageal reflux disease, hypertension, hyperlipidemia,  peripheral vascular disease, tachybradycardia syndrome, obstructive sleep apnea, depression alcohol use, suffered a fall at home resulting in a periprosthetic fracture in his left hip. Seen by orthopedic surgery felt not to be a surgical candidate at this time. Today patient became more somnolent, was given a dose of Narcan without significant improvement, arterial blood gas revealed acute hypercapnic respiratory failure requiring transfer to the intensive care unit in noninvasive ventilation. Patient is presently on noninvasive ventilation, is easily arousable wakes up and communicates  PAST MEDICAL HISTORY :  Past Medical History:  Diagnosis Date  . AAA (abdominal aortic aneurysm) ([place])    a. s/p repair - 1999 @ [facility] @ time of CABG.  . Broken ankle   . Cancer ([place])    skin cancer nose  . Chronic atrial fibrillation ([facility])    a. on pradaxa  . Chronic diastolic CHF (congestive heart failure) ([place])    a. 01/2012 Echo:  EF 55%.  . Chronic obstructive pulmonary disease ([place])   . CKD (chronic kidney disease), stage III    a. Acute on chronic 03/2012  . Coronary artery disease    a. s/p MI x 3;  b. s/p CABG x2 in 1999;  c. Cath 2007: LIMA->LAD patent, VG->OM 100, PCI/DES of RPL/mid RCA (cypher DES).  . Depression   . GERD (gastroesophageal reflux disease)   . History of suicidal ideation   . Hyperlipidemia   . Hypertension   . PVD (peripheral vascular disease) ([place])   . Sleep apnea   . Tachy-brady syndrome ([place])    a. s/p PPM 2010.   Past Surgical History:  Procedure Laterality Date  . ABDOMINAL AORTIC ANEURYSM REPAIR    . ANKLE SURGERY Right   . CORONARY ARTERY BYPASS GRAFT    .  FOOT SURGERY     left foot surgery to remove cyst.  . INSERT / REPLACE / REMOVE PACEMAKER    . Left total hip arthroplasty with revision     Dr. Margaretmary Eddy  . ORIF ANKLE FRACTURE Left 01/08/2015   Procedure: OPEN REDUCTION INTERNAL FIXATION (ORIF) ANKLE FRACTURE;  Surgeon: Earnestine Leys, MD;  Location: [facility]  ORS;  Service: Orthopedics;  Laterality: Left;  . skin cancer biopsy     nose  . TRIGGER FINGER RELEASE     Prior to Admission medications   Medication Sig Start Date End Date Taking? Authorizing Provider  acetaminophen (TYLENOL) 325 MG tablet Take 650 mg by mouth every 6 (six) hours as needed.    [provider]  diltiazem (CARDIZEM CD) 120 MG 24 hr capsule Take 1 capsule (120 mg total) by mouth daily. Patient taking differently: Take 120 mg by mouth daily with lunch.  07/15/14   Deboraha Sprang, MD  donepezil (ARICEPT) 5 MG tablet Take 5 mg by mouth at bedtime.    [provider]  ELIQUIS 2.5 MG TABS tablet TAKE ONE TABLET BY MOUTH TWICE DAILY 09/09/16   Minna Merritts, MD  guaiFENesin (MUCINEX) 600 MG 12 hr tablet Take 1 tablet (600 mg total) by mouth 2 (two) times daily as needed for cough or to loosen phlegm. 07/22/16   Harvest Dark, MD  metoprolol succinate (TOPROL-XL) 100 MG 24 hr tablet Take 1 tablet (100 mg total) by mouth daily. Patient taking differently: Take 100 mg by mouth daily with lunch.  06/03/13   Deboraha Sprang, MD  midodrine (PROAMATINE) 10 MG tablet Take 1 tablet (10 mg total) by mouth 3 (three) times daily. 08/09/16   Minna Merritts, MD  nitroGLYCERIN (NITROSTAT) 0.4 MG SL tablet Place 1 tablet (0.4 mg total) under the tongue every 5 (five) minutes as needed for chest pain. 10/06/15   Minna Merritts, MD  omeprazole (PRILOSEC) 20 MG capsule Take 20 mg by mouth daily with lunch.     [provider]  simvastatin (ZOCOR) 20 MG tablet Take 1 tablet (20 mg total) by mouth at bedtime. 11/13/13   Minna Merritts, MD  Tamsulosin HCl (FLOMAX) 0.4 MG CAPS Take 0.4 mg by mouth daily after supper.    [provider]  torsemide (DEMADEX) 20 MG tablet Take 1 tablet (20 mg total) by mouth 2 (two) times daily as needed (for weight gain). 03/22/16   Minna Merritts, MD  traMADol (ULTRAM) 50 MG tablet Take 1 tablet (50 mg total) by mouth every 6  (six) hours as needed. 07/22/16 07/22/17  Harvest Dark, MD   Allergies  Allergen Reactions  . Lipitor [Atorvastatin] Other (See Comments)    Arm pain    FAMILY HISTORY:  Family History  Problem Relation Age of Onset  . Stomach cancer Mother        died @ 71  . Diabetes Mother   . Hypertension Mother   . Coronary artery disease Mother   . Cerebral aneurysm Father        died @ 4  . Diabetes Father   . Hypertension Father   . Coronary artery disease Father    SOCIAL HISTORY:  reports that he quit smoking about 19 years ago. He has a 20.00 pack-year smoking history. He has never used smokeless tobacco. He reports that he does not drink alcohol or use drugs.  REVIEW OF SYSTEMS:   Limited review of systems secondary to patient's  present medical status   VITAL SIGNS: Temp:  [97.8 F (36.6 C)-98.8 F (37.1 C)] 97.9 F (36.6 C) (07/07 1331) Pulse Rate:  [59-65] 59 (07/07 1331) Resp:  [17-20] 17 (07/07 1331) BP: (120-139)/(58-73) 139/65 (07/07 1130) SpO2:  [97 %-100 %] 97 % (07/07 1331) HEMODYNAMICS:     INTAKE / OUTPUT:  Intake/Output Summary (Last 24 hours) at 10/15/16 1453 Last data filed at 10/15/16 0645  Gross per 24 hour  Intake              240 ml  Output              650 ml  Net             -410 ml    Physical Examination:   VS: BP 139/65 (BP Location: Right Arm)   Pulse (!) 59   Temp 97.9 F (36.6 C) (Oral)   Resp 17   Ht 6\' 1"  (1.854 m)   Wt 97.5 kg (215 lb)   SpO2 97%   BMI 28.37 kg/m   General Appearance: No distress  Neuro:without focal findings, mental status, speech normal,. HEENT: PERRLA, EOM intact, no ptosis, no other lesions noticed;  Pulmonary: Mild expiratory rhonchi appreciated bilateral. Cardiovascular irregularly irregular rhythm, midline sternotomy scar noted, systolic murmur left parasternal border  Abdomen: Benign, Soft, non-tender, No masses, hepatosplenomegaly, No lymphadenopathy Skin:   warm, no rashes, no ecchymosis    Extremities: normal, no cyanosis, clubbing, no edema, warm with normal capillary refill.    LABS: Reviewed   LABORATORY PANEL:   CBC  Recent Labs Lab 10/14/16 0624  WBC 6.8  HGB 9.4*  HCT 28.8*  PLT 156    Chemistries   Recent Labs Lab 10/15/16 0339  NA 139  K 5.2*  CL 107  CO2 25  GLUCOSE 131*  BUN 48*  CREATININE 3.83*  CALCIUM 8.7*     Recent Labs Lab 10/15/16 1149  GLUCAP 115*    Recent Labs Lab 10/15/16 1346  PHART 7.18*  PCO2ART 66*  PO2ART 106   No results for input(s): AST, ALT, ALKPHOS, BILITOT, ALBUMIN in the last 168 hours.  Cardiac Enzymes  Recent Labs Lab 10/13/16 2357  TROPONINI 0.03*    RADIOLOGY:  Ct Hip Left Wo Contrast  Result Date: 10/14/2016 CLINICAL DATA:  Left hip pain after fall. EXAM: CT OF THE LEFT HIP WITHOUT CONTRAST TECHNIQUE: Multidetector CT imaging of the left hip was performed according to the standard protocol. Multiplanar CT image reconstructions were also generated. COMPARISON:  Radiographs earlier this day. FINDINGS: Bones/Joint/Cartilage Left hip arthroplasty with cerclage wire fixation. Acute nondisplaced fracture of the subtrochanteric femur just below the intertrochanteric region. Fracture may abut the femoral stem. There is heterotopic calcification about the greater trochanter superiorly. No additional fracture of the left hip or pelvis. Pubic rami are intact. No visualize sacral fracture. Left sacroiliac joint and pubic symphysis are congruent were visualized. Ligaments Suboptimally assessed by CT. Muscles and Tendons No evidence of intramuscular hematoma. Soft tissues Soft tissue density adjacent is cerclage wire extending to skin may be postsurgical change or hematoma. Fat within both inguinal canals. Colonic diverticulosis is incidentally noted. Atherosclerotic calcifications of pelvic vasculature. IMPRESSION: Left hip arthroplasty with acute minimally displaced periprosthetic fracture in the subtrochanteric  region. Electronically Signed   By: Jeb Levering M.D.   On: 10/14/2016 02:33   Dg Chest Port 1 View  Result Date: 10/14/2016 CLINICAL DATA:  Fall EXAM: PORTABLE CHEST 1 VIEW COMPARISON:  Chest radiograph  07/22/2016 FINDINGS: Cardiomediastinal silhouette remains enlarged. Single lead left chest wall pacemaker is unchanged. There are findings of prior CABG. Calcified granuloma at the left lung base. No focal consolidation or pulmonary edema. IMPRESSION: No active disease. Electronically Signed   By: Ulyses Jarred M.D.   On: 10/14/2016 00:37   Dg Hip Unilat With Pelvis 2-3 Views Left  Result Date: 10/14/2016 CLINICAL DATA:  Left hip pain after fall from standing in the kitchen. EXAM: DG HIP (WITH OR WITHOUT PELVIS) 2-3V LEFT COMPARISON:  Left hip radiograph 02/19/2007 FINDINGS: Left hip arthroplasty in expected alignment with cerclage wire. Mild heterotopic calcification adjacent to the greater trochanter. Cortical irregularity laterally about the intertrochanteric region is unchanged from remote prior exam. No evidence of acute or periprosthetic fracture. No periprosthetic lucency. Pubic rami are intact. Pubic symphysis and sacroiliac joints are congruent. IMPRESSION: Left hip arthroplasty in place without evidence of acute or periprosthetic fracture. Mild heterotopic calcification adjacent to the greater trochanter. Electronically Signed   By: Jeb Levering M.D.   On: 10/14/2016 00:37    Hermelinda Dellen, DO ICU Pager [phone] [facility] Office Number: [phone]  Patricia Pesa, M.D.  Merton Border, M.D   10/15/2016, 2:53 PM

## 2016-10-15 NOTE — Progress Notes (Signed)
Report called to Trula Slade. Called RT to come help transport. Patient currently on Bipap.

## 2016-10-15 NOTE — Progress Notes (Signed)
Patient resting in bed, sleeping between care. AM meds given, breakfast tray at bedside, patient states he is not hungry at the moment. Urinal at bedside. Tele v paced with bundle branch block. No complaints at this time. Will continue to monitor.

## 2016-10-15 NOTE — Progress Notes (Signed)
K 5.2 this am. PT refusing to work with patient due to potassium. Dr Leslye Peer notified.

## 2016-10-15 NOTE — Progress Notes (Signed)
Patient transferred to CCU.

## 2016-10-15 NOTE — Progress Notes (Signed)
PT Cancellation Note  Patient Details Name: [name] MRN: [id number] DOB: 10-09-1933   Cancelled Treatment:    Reason Eval/Treat Not Completed: Patient not medically ready. Potassium level this morning too high (5.2). Re attempt at a later date when lab values improve.    Larae Grooms, PTA 10/15/2016, 9:29 AM

## 2016-10-15 NOTE — Clinical Social Work Note (Signed)
CSW received consult for SNF placement. CSW will assess when able.  Imani Fiebelkorn Martha Rafeef Lau, MSW, LCSWA [phone] 

## 2016-10-16 LAB — BASIC METABOLIC PANEL
ANION GAP: 8 (ref 5–15)
BUN: 46 mg/dL — ABNORMAL HIGH (ref 6–20)
CALCIUM: 8.7 mg/dL — AB (ref 8.9–10.3)
CO2: 22 mmol/L (ref 22–32)
Chloride: 109 mmol/L (ref 101–111)
Creatinine, Ser: 3.63 mg/dL — ABNORMAL HIGH (ref 0.61–1.24)
GFR, EST AFRICAN AMERICAN: 17 mL/min — AB (ref >60)
GFR, EST NON AFRICAN AMERICAN: 14 mL/min — AB (ref >60)
Glucose, Bld: 148 mg/dL — ABNORMAL HIGH (ref 65–99)
Potassium: 5.6 mmol/L — ABNORMAL HIGH (ref 3.5–5.1)
SODIUM: 139 mmol/L (ref 135–145)

## 2016-10-16 LAB — BLOOD GAS, ARTERIAL
ACID-BASE DEFICIT: 7.4 mmol/L — AB (ref 0.0–2.0)
ACID-BASE DEFICIT: 7.4 mmol/L — AB (ref 0.0–2.0)
Bicarbonate: 21.5 mmol/L (ref 20.0–28.0)
Bicarbonate: 21 mmol/L (ref 20.0–28.0)
Expiratory PAP: 6
Expiratory PAP: 6
FIO2: 0.28
FIO2: 0.28
INSPIRATORY PAP: 16
INSPIRATORY PAP: 18
MODE: POSITIVE
Mechanical Rate: 8
Mode: POSITIVE
O2 Saturation: 96 %
O2 Saturation: 94.7 %
PCO2 ART: 59 mmHg — AB (ref 32.0–48.0)
PH ART: 7.17 — AB (ref 7.350–7.450)
PO2 ART: 91 mmHg (ref 83.0–108.0)
Patient temperature: 37
Patient temperature: 37
pCO2 arterial: 55 mmHg — ABNORMAL HIGH (ref 32.0–48.0)
pH, Arterial: 7.19 — CL (ref 7.350–7.450)
pO2, Arterial: 102 mmHg (ref 83.0–108.0)

## 2016-10-16 LAB — TROPONIN I

## 2016-10-16 LAB — GLUCOSE, CAPILLARY: GLUCOSE-CAPILLARY: 145 mg/dL — AB (ref 65–99)

## 2016-10-16 LAB — LACTIC ACID, PLASMA: LACTIC ACID, VENOUS: 0.8 mmol/L (ref 0.5–1.9)

## 2016-10-16 LAB — AMMONIA: Ammonia: 12 umol/L (ref 9–35)

## 2016-10-16 LAB — POTASSIUM: POTASSIUM: 5 mmol/L (ref 3.5–5.1)

## 2016-10-16 MED ORDER — INSULIN REGULAR HUMAN 100 UNIT/ML IJ SOLN: 10.0000 [IU] | Freq: Once | INTRAMUSCULAR | Status: DC

## 2016-10-16 MED ORDER — SODIUM CHLORIDE 0.9 % IV BOLUS (SEPSIS): 1000.0000 mL | Freq: Once | INTRAVENOUS | Status: DC

## 2016-10-16 MED ORDER — SODIUM BICARBONATE 8.4 % IV SOLN
50.0000 meq | Freq: Once | INTRAVENOUS | Status: AC
  Administered 2016-10-16: 50 meq via INTRAVENOUS
  Filled 2016-10-16: qty 50

## 2016-10-16 MED ORDER — DEXTROSE 50 % IV SOLN
INTRAVENOUS | Status: AC
Start: 2016-10-16 — End: 2016-10-16
  Filled 2016-10-16: qty 50

## 2016-10-16 MED ORDER — DEXTROSE 50 % IV SOLN
1.0000 | Freq: Once | INTRAVENOUS | Status: AC
  Administered 2016-10-16: 50 mL via INTRAVENOUS

## 2016-10-16 MED ORDER — SODIUM CHLORIDE 0.9 % IV SOLN
1.0000 g | Freq: Once | INTRAVENOUS | Status: AC
  Administered 2016-10-16: 1 g via INTRAVENOUS
  Filled 2016-10-16: qty 10

## 2016-10-16 MED ORDER — HYDROCODONE-ACETAMINOPHEN 5-325 MG PO TABS
1.0000 | ORAL_TABLET | ORAL | Status: DC | PRN
Start: 2016-10-16 — End: 2016-10-19
  Administered 2016-10-16: 1 via ORAL
  Filled 2016-10-16: qty 1

## 2016-10-16 MED ORDER — INSULIN ASPART 100 UNIT/ML ~~LOC~~ SOLN
10.0000 [IU] | Freq: Once | SUBCUTANEOUS | Status: AC
  Administered 2016-10-16: 10 [IU] via SUBCUTANEOUS
  Filled 2016-10-16: qty 1

## 2016-10-16 NOTE — Progress Notes (Signed)
[facility]  ROUNDING NOTE   Subjective:   Mr. Antawn Sison Arizola admitted to [facility] on Fall [W19.XXXA] Left hip pain [M25.552] Closed fracture of left hip, initial encounter ([place]) [S72.002A] Anemia, unspecified type [D64.9]   Wife at bedside. Patient was last seen by my office 06/2014. Creatinine of 2.19, GFR of 28.   Patient currently with BIPAP. Respiratory acidosis.   Patient has been drinking heavy for last 6 months. His son died in in 06/03/22.   Objective:  Vital signs in last 24 hours:  Temp:  [97.8 F (36.6 C)-98.9 F (37.2 C)] 98.9 F (37.2 C) (07/07 1947) Pulse Rate:  [59-68] 62 (07/08 0600) Resp:  [10-21] 16 (07/08 0734) BP: (106-144)/(54-67) 135/59 (07/08 0600) SpO2:  [92 %-100 %] 98 % (07/08 0734) FiO2 (%):  [28 %] 28 % (07/08 0734) Weight:  [100.7 kg (222 lb 0.1 oz)] 100.7 kg (222 lb 0.1 oz) (07/07 1500)  Weight change:  Filed Weights   10/13/16 2342 10/15/16 1500  Weight: 97.5 kg (215 lb) 100.7 kg (222 lb 0.1 oz)    Intake/Output: I/O last 3 completed shifts: In: 2200 [I.V.:1200; IV Piggyback:1000] Out: 1025 [Urine:1025]   Intake/Output this shift:  No intake/output data recorded.  Physical Exam: General: Critically ill  Head: +BIPAP  Eyes: Anicteric, PERRL  Neck: Supple, trachea midline  Lungs:  Clear, BIPAP   Heart: Regular rate and rhythm, +pacemaker  Abdomen:  Soft, nontender, obese  Extremities: no peripheral edema.  Neurologic: Nonfocal, moving all four extremities  Skin: No lesions       Basic Metabolic Panel:  Recent Labs Lab 10/14/16 0624 10/15/16 0339 10/15/16 1450 10/15/16 2137 10/16/16 0326  NA 139 139 138 140 139  K 4.8 5.2* 5.8* 5.7* 5.6*  CL 108 107 106 109 109  CO2 22 25 24 22 22   GLUCOSE 82 131* 119* 127* 148*  BUN 42* 48* 47* 47* 46*  CREATININE 4.01* 3.83* 3.75* 3.75* 3.63*  CALCIUM 8.4* 8.7* 8.7* 8.9 8.7*  MG  --   --   --  2.0  --   PHOS  --   --   --  5.3*  --     Liver Function Tests: No  results for input(s): AST, ALT, ALKPHOS, BILITOT, PROT, ALBUMIN in the last 168 hours. No results for input(s): LIPASE, AMYLASE in the last 168 hours.  Recent Labs Lab 10/16/16 0326  AMMONIA 12    CBC:  Recent Labs Lab 10/13/16 2357 10/14/16 0624  WBC 9.3 6.8  HGB 9.7* 9.4*  HCT 29.5* 28.8*  MCV 92.2 92.3  PLT 182 156    Cardiac Enzymes:  Recent Labs Lab 10/13/16 2357 10/15/16 1450 10/15/16 2137 10/16/16 0326  TROPONINI 0.03* 0.03* <0.03 <0.03    BNP: Invalid input(s): POCBNP  CBG:  Recent Labs Lab 10/15/16 1149 10/16/16 0715  GLUCAP 115* 145*    Microbiology: Results for orders placed or performed during the hospital encounter of 10/13/16  Surgical PCR screen     Status: Abnormal   Collection Time: 10/14/16  5:25 AM  Result Value Ref Range Status   MRSA, PCR NEGATIVE NEGATIVE Final   Staphylococcus aureus POSITIVE (A) NEGATIVE Final    Comment:        The Xpert SA Assay (FDA approved for NASAL specimens in patients over 78 years of age), is one component of a comprehensive surveillance program.  Test performance has been validated by [facility] for patients greater than or equal to 43 year old.  It is not intended to diagnose infection nor to guide or monitor treatment.   MRSA PCR Screening     Status: None   Collection Time: 10/15/16  3:20 PM  Result Value Ref Range Status   MRSA by PCR NEGATIVE NEGATIVE Final    Comment:        The GeneXpert MRSA Assay (FDA approved for NASAL specimens only), is one component of a comprehensive MRSA colonization surveillance program. It is not intended to diagnose MRSA infection nor to guide or monitor treatment for MRSA infections.     Coagulation Studies: No results for input(s): LABPROT, INR in the last 72 hours.  Urinalysis:  Recent Labs  10/14/16 0211  COLORURINE STRAW*  LABSPEC 1.010  PHURINE 6.0  GLUCOSEU NEGATIVE  HGBUR SMALL*  BILIRUBINUR NEGATIVE  KETONESUR NEGATIVE   PROTEINUR 30*  NITRITE NEGATIVE  LEUKOCYTESUR NEGATIVE      Imaging: No results found.   Medications:   . sodium chloride 125 mL/hr (10/15/16 2233)  . calcium gluconate 1 g (10/16/16 0931)  . sodium chloride     . apixaban  2.5 mg Oral BID  . Chlorhexidine Gluconate Cloth  6 each Topical Daily  . diltiazem  120 mg Oral Daily  . donepezil  5 mg Oral QHS  . escitalopram  10 mg Oral Daily  . folic acid  1 mg Oral Daily  . ipratropium-albuterol  3 mL Nebulization Q6H  . LORazepam  0-4 mg Oral Q12H  . mouth rinse  15 mL Mouth Rinse BID  . methylPREDNISolone (SOLU-MEDROL) injection  60 mg Intravenous Q6H  . metoprolol succinate  100 mg Oral Daily  . midodrine  10 mg Oral TID  . mirtazapine  15 mg Oral QHS  . multivitamin with minerals  1 tablet Oral Daily  . mupirocin ointment  1 application Nasal BID  . pantoprazole  40 mg Oral Daily  . protein supplement shake  11 oz Oral BID BM  . simvastatin  20 mg Oral QHS  . tamsulosin  0.4 mg Oral QPC supper  . thiamine  100 mg Oral Daily   acetaminophen **OR** acetaminophen, guaiFENesin, LORazepam **OR** LORazepam, nitroGLYCERIN, ondansetron **OR** ondansetron (ZOFRAN) IV, senna-docusate  Assessment/ Plan:  Mr. JULEZ HUSEBY is a 83 y.o. white male with Diastolic Congestive Heart Failure, coronary artery disease status post CABG and stents, AAA status post repair, atrial fibrillation, sick sinus status post pacemaker, COPD, GERD, peripheral arter disease, Hypertension, Dupuytren's contracture and history of alcohol abuse admitted to [facility] on 10/13/2016   Nephrology consulted for acute renal failure on chronic kidney disease stage IV  1. Acute Renal Failure with hyperkalemia on chronic kidney disease stage IV with baseline creatinine of 3.16 GFR of 17 on 07/22/16 - Place foley catheter - Family states that patient does not want dialysis.  - Discussed goals of care, change status to DNR.  - Continue IV fluids, monitor urine output,  renal function and electrolytes.   2. Acidosis: respiratory with hypercapnea.  - Appreciate pulm input - on BIPAP  3. Diastolic congestive heart failure: not in acute exacerbation.  - monitor closely. Discontinue IVF - Holding torsemide  4. Anemia of chronic kidney disease: hemoglobin 9.4   LOS: 2 Edilson Vital 7/8/20189:41 AM

## 2016-10-16 NOTE — Progress Notes (Signed)
[facility] Progess Note    SYNOPSIS   83 y.o.malewith a known history of Abdominal aortic aneurysm , nasal cancer, chronic atrial fibrillation systemic anticoagulation, coronary artery disease, COPD, chronic kidney disease stage III, diastolic heart failure, peripheral vascular disease presented to the emergency room with left hip pain.Patient drank alcohol and lost balance and fell down at home.  Patient had hip surgery on the left side and has a prosthesis in the past.Imaging studies today revealed fracture of the prosthesis.Orthopedic surgery was consulted by ER physician.  ASSESSMENT/PLAN   Hypercapnic respiratory failure. Patient does have a history of COPD, is presently on Solu-Medrol, albuterol, Atrovent. He is on systemic anticoagulation, with his creatinine elevated unable to perform P ACT, VQ scan secondary to noninvasive requirements. Although his pH is 7.18 he is awake alert and communicating so we'll hold on intubation for now  Renal failure acute on chronic  Hyperkalemia. Potassium is elevated at 5.6, will provide bicarbonate, D50, insulin, calcium gluconate and Kayexalate  History of tachybradycardia syndrome status post pacemaker  Anemia no evidence of active bleeding  Periprosthetic hip fracture, being followed by orthopedic surgery  History of coronary artery disease. Cardiac enzymes are negative  Critical care time 40 minutes  Name: [name] MRN: [id number] DOB: 07/01/1933    ADMISSION DATE:  10/13/2016  SUBJECTIVE:  No significant change in status, patient still requiring noninvasive ventilation, pH has fluctuated anywhere from 7.18-7.3. Patient however is easily arousable and communicating  VITAL SIGNS: Temp:  [97.8 F (36.6 C)-98.9 F (37.2 C)] 98.9 F (37.2 C) (07/07 1947) Pulse Rate:  [59-68] 62 (07/08 0600) Resp:  [10-21] 16 (07/08 0600) BP: (106-144)/(54-67) 135/59 (07/08 0600) SpO2:  [92 %-100 %] 97 % (07/08  0600) Weight:  [100.7 kg (222 lb 0.1 oz)] 100.7 kg (222 lb 0.1 oz) (07/07 1500)  PHYSICAL EXAMINATION: Physical Examination:   VS: BP (!) 135/59   Pulse 62   Temp 98.9 F (37.2 C) (Axillary)   Resp 16   Ht 6\' 1"  (1.854 m)   Wt 100.7 kg (222 lb 0.1 oz)   SpO2 97%   BMI 29.29 kg/m   General Appearance: No distress  Neuro:without focal findings, mental status normal. HEENT: PERRLA, EOM intact. Presently on noninvasive ventilation Pulmonary: Diminished breath sounds but clear Cardiovascular  rhythm is regular, paced on the monitor Abdomen: Benign, Soft, non-tender. Skin:   warm, no rashes, no ecchymosis  Extremities: normal, no cyanosis, clubbing.    LABORATORY PANEL:   CBC  Recent Labs Lab 10/14/16 0624  WBC 6.8  HGB 9.4*  HCT 28.8*  PLT 156    Chemistries   Recent Labs Lab 10/15/16 2137 10/16/16 0326  NA 140 139  K 5.7* 5.6*  CL 109 109  CO2 22 22  GLUCOSE 127* 148*  BUN 47* 46*  CREATININE 3.75* 3.63*  CALCIUM 8.9 8.7*  MG 2.0  --   PHOS 5.3*  --      Recent Labs Lab 10/15/16 1149 10/16/16 0715  GLUCAP 115* 145*    Recent Labs Lab 10/15/16 1555 10/16/16 0500 10/16/16 0800  PHART 7.22* 7.17* 7.19*  PCO2ART 58* 59* 55*  PO2ART 74* 102 91   No results for input(s): AST, ALT, ALKPHOS, BILITOT, ALBUMIN in the last 168 hours.  Cardiac Enzymes  Recent Labs Lab 10/16/16 0326  TROPONINI <0.03    RADIOLOGY:  No results found.   Hermelinda Dellen, DO ICU Pager: [phone] [facility] Office Number: [phone]  10/16/2016  

## 2016-10-16 NOTE — Progress Notes (Addendum)
Patient ID: ELENO WEIMAR, male   DOB: [birth year], 83 y.o.   MRN: [id number]  [facility] PROGRESS NOTE  DOM HAVERLAND OMV:[id number] DOB: 1934/04/11 DOA: 10/13/2016 PCP: Maryland Pink, MD  HPI/Subjective: Patient feels okay. He always has shortness of breath. He was able to answer questions and talk today. Patient having twitching of his left lower extremity that is uncontrollable  Objective: Vitals:   10/16/16 1000 10/16/16 1100  BP: (!) 142/64 (!) 157/73  Pulse: 62 65  Resp: 17 11  Temp:      Filed Weights   10/13/16 2342 10/15/16 1500  Weight: 97.5 kg (215 lb) 100.7 kg (222 lb 0.1 oz)    ROS: Review of Systems  Constitutional: Negative for chills and fever.  Eyes: Negative for blurred vision.  Respiratory: Positive for shortness of breath. Negative for cough.   Cardiovascular: Negative for chest pain.  Gastrointestinal: Negative for abdominal pain, constipation, diarrhea, nausea and vomiting.  Genitourinary: Negative for dysuria.  Musculoskeletal: Negative for joint pain.  Neurological: Positive for tremors. Negative for dizziness and headaches.    Exam: Physical Exam  HENT:  Nose: No mucosal edema.  Mouth/Throat: No oropharyngeal exudate or posterior oropharyngeal edema.  Eyes: Conjunctivae and lids are normal. Pupils are equal, round, and reactive to light.  Neck: No JVD present. Carotid bruit is not present. No edema present. No thyroid mass and no thyromegaly present.  Cardiovascular: S1 normal, S2 normal and normal heart sounds.  Exam reveals no gallop.   No murmur heard. Pulses:      Dorsalis pedis pulses are 2+ on the right side, and 2+ on the left side.  Respiratory: No respiratory distress. He has decreased breath sounds in the right lower field and the left lower field. He has no wheezes. He has no rhonchi. He has no rales.  GI: Soft. Bowel sounds are normal. There is no tenderness.  Musculoskeletal:       Right ankle: He exhibits swelling.        Left ankle: He exhibits swelling.  Lymphadenopathy:    He has no cervical adenopathy.  Neurological: He is alert.  Able to straight leg raise on the right easier than on the left  Skin: Skin is warm. No rash noted. Nails show no clubbing.  Psychiatric: He has a normal mood and affect.      Data Reviewed: Basic Metabolic Panel:  Recent Labs Lab 10/14/16 0624 10/15/16 0339 10/15/16 1450 10/15/16 2137 10/16/16 0326  NA 139 139 138 140 139  K 4.8 5.2* 5.8* 5.7* 5.6*  CL 108 107 106 109 109  CO2 22 25 24 22 22   GLUCOSE 82 131* 119* 127* 148*  BUN 42* 48* 47* 47* 46*  CREATININE 4.01* 3.83* 3.75* 3.75* 3.63*  CALCIUM 8.4* 8.7* 8.7* 8.9 8.7*  MG  --   --   --  2.0  --   PHOS  --   --   --  5.3*  --    CBC:  Recent Labs Lab 10/13/16 2357 10/14/16 0624  WBC 9.3 6.8  HGB 9.7* 9.4*  HCT 29.5* 28.8*  MCV 92.2 92.3  PLT 182 156   Cardiac Enzymes:  Recent Labs Lab 10/13/16 2357 10/15/16 1450 10/15/16 2137 10/16/16 0326  TROPONINI 0.03* 0.03* <0.03 <0.03    CBG:  Recent Labs Lab 10/15/16 1149 10/16/16 0715  GLUCAP 115* 145*    Recent Results (from the past 240 hour(s))  Surgical PCR screen     Status: Abnormal  Collection Time: 10/14/16  5:25 AM  Result Value Ref Range Status   MRSA, PCR NEGATIVE NEGATIVE Final   Staphylococcus aureus POSITIVE (A) NEGATIVE Final    Comment:        The Xpert SA Assay (FDA approved for NASAL specimens in patients over 59 years of age), is one component of a comprehensive surveillance program.  Test performance has been validated by [facility] for patients greater than or equal to 76 year old. It is not intended to diagnose infection nor to guide or monitor treatment.   MRSA PCR Screening     Status: None   Collection Time: 10/15/16  3:20 PM  Result Value Ref Range Status   MRSA by PCR NEGATIVE NEGATIVE Final    Comment:        The GeneXpert MRSA Assay (FDA approved for NASAL specimens only), is one component  of a comprehensive MRSA colonization surveillance program. It is not intended to diagnose MRSA infection nor to guide or monitor treatment for MRSA infections.       Scheduled Meds: . apixaban  2.5 mg Oral BID  . Chlorhexidine Gluconate Cloth  6 each Topical Daily  . diltiazem  120 mg Oral Daily  . donepezil  5 mg Oral QHS  . escitalopram  10 mg Oral Daily  . folic acid  1 mg Oral Daily  . ipratropium-albuterol  3 mL Nebulization Q6H  . LORazepam  0-4 mg Oral Q12H  . mouth rinse  15 mL Mouth Rinse BID  . methylPREDNISolone (SOLU-MEDROL) injection  60 mg Intravenous Q6H  . metoprolol succinate  100 mg Oral Daily  . midodrine  10 mg Oral TID  . mirtazapine  15 mg Oral QHS  . multivitamin with minerals  1 tablet Oral Daily  . mupirocin ointment  1 application Nasal BID  . pantoprazole  40 mg Oral Daily  . protein supplement shake  11 oz Oral BID BM  . simvastatin  20 mg Oral QHS  . tamsulosin  0.4 mg Oral QPC supper  . thiamine  100 mg Oral Daily   Continuous Infusions: . sodium chloride      Assessment/Plan:  1. Acute respiratory acidosis with high PCO2.  Last blood gas shows the patient is still acidotic. He is mentating better. Patient was made a DO NOT RESUSCITATE. 2. Acute encephalopathy. Improved from yesterday 3. Alcohol abuse. Patient on CIWA protocol. Continue thiamine, folic acid and multivitamin. I talked with the patient at length about stopping alcohol. Family does not think he will stop. Family is concerned that he has a Optician, dispensing. I advised the patient that he should not be driving anymore. 4. Acute kidney injury on chronic kidney disease stage IV.  Appreciate nephrology consultation. Family declined dialysis. 5. Hyperkalemia. Likely secondary to acute kidney injury 6. BPH on Flomax 7. Chronic atrial fibrillation- Eliquis, diltiazem and metoprolol 8. Hip fracture nonsurgical as per orthopedic surgery. Pain control with Tylenol 9. Twitching left lower  extremity. Case discussed with Dr. Jefferson Fuel this morning and he will keep an eye on this while the patient in the ICU. Could be a restless leg syndrome. Monitor for seizure activity.  Code Status:     Code Status Orders        Start     Ordered   10/14/16 0433  Full code  Continuous     10/14/16 0432    Code Status History    Date Active Date Inactive Code Status Order ID Comments User Context  11/24/2015  5:27 PM 11/26/2015  8:13 PM Full Code [id number]  Epifanio Lesches, MD ED   01/08/2015 11:16 PM 01/10/2015  8:35 PM Full Code [id number]  Earnestine Leys, MD Inpatient   01/07/2015  3:06 PM 01/08/2015 11:16 PM Full Code [id number]  Earnestine Leys, MD ED   08/22/2014  8:47 PM 08/25/2014  5:54 PM Full Code [id number]  Dustin Flock, MD ED    Advance Directive Documentation     Most Recent Value  Type of Advance Directive  Living will  Pre-existing out of facility DNR order (yellow form or pink MOST form)  -  "MOST" Form in Place?  -     Family Communication: Spoke with Family at the bedside Disposition Plan: To be determined but likely will need rehabilitation  Consultants:  Critical care specialist  Orthopedic surgery  Time spent: 25 minutes. Case discussed with critical care specialist and nephrologist  [place]

## 2016-10-16 NOTE — Progress Notes (Signed)
Increased IPAP to 18, Dr. Jefferson Fuel notified. Will continue to monitor.

## 2016-10-16 NOTE — Progress Notes (Signed)
PT Cancellation Note  Patient Details Name: [name] MRN: [id number] DOB: 10-09-1933   Cancelled Treatment:    Reason Eval/Treat Not Completed: Other (comment) (Change in status.) After performing chart review, PT determined new consult will be needed when patient medically ready, as he was placed on BiPAP and transferred to ICU. PT will complete current consult and new order will be necessary if PT indicated.   Dorice Lamas, PT, DPT 10/16/2016, 8:11 AM

## 2016-10-17 DIAGNOSIS — F1010 Alcohol abuse, uncomplicated: Secondary | ICD-10-CM

## 2016-10-17 LAB — BASIC METABOLIC PANEL
ANION GAP: 6 (ref 5–15)
BUN: 61 mg/dL — ABNORMAL HIGH (ref 6–20)
CHLORIDE: 105 mmol/L (ref 101–111)
CO2: 25 mmol/L (ref 22–32)
Calcium: 8.7 mg/dL — ABNORMAL LOW (ref 8.9–10.3)
Creatinine, Ser: 3.33 mg/dL — ABNORMAL HIGH (ref 0.61–1.24)
GFR calc Af Amer: 19 mL/min — ABNORMAL LOW (ref >60)
GFR, EST NON AFRICAN AMERICAN: 16 mL/min — AB (ref >60)
Glucose, Bld: 176 mg/dL — ABNORMAL HIGH (ref 65–99)
POTASSIUM: 5.1 mmol/L (ref 3.5–5.1)
SODIUM: 136 mmol/L (ref 135–145)

## 2016-10-17 LAB — CBC
HEMATOCRIT: 28.2 % — AB (ref 40.0–52.0)
HEMOGLOBIN: 9.4 g/dL — AB (ref 13.0–18.0)
MCH: 31.2 pg (ref 26.0–34.0)
MCHC: 33.4 g/dL (ref 32.0–36.0)
MCV: 93.7 fL (ref 80.0–100.0)
Platelets: 171 10*3/uL (ref 150–440)
RBC: 3.01 MIL/uL — AB (ref 4.40–5.90)
RDW: 16.4 % — ABNORMAL HIGH (ref 11.5–14.5)
WBC: 10.5 10*3/uL (ref 3.8–10.6)

## 2016-10-17 NOTE — Progress Notes (Signed)
Clinical Education officer, museum (CSW) presented bed offers to patient and his son Hal at bedside. Patient chose [facility]. CSW contacted patient's wife [name] and made her aware of above. Wife is agreeable to [facility]. [place] admissions coordinator at [facility] stated that she will start [place] authorization today. CSW will continue to follow and assist as needed.   [employer], LCSW [phone]

## 2016-10-17 NOTE — Progress Notes (Signed)
Patient ID: [name], male   DOB: 11-11-1933, 83 y.o.   MRN: [id number]   [facility] PROGRESS NOTE  [name]:[id number] DOB: 08-12-1933 DOA: 10/13/2016 PCP: [place] Pink, MD  HPI/Subjective: Patient feels okay. Not having any further twitching in his left leg. Always a [place] short of breath. Pain in his left hip when he moves his leg  Objective: Vitals:   10/17/16 1206 10/17/16 1300  BP: 129/72 126/65  Pulse: 68 61  Resp: 19 15  Temp:      Filed Weights   10/13/16 2342 10/15/16 1500  Weight: 97.5 kg (215 lb) 100.7 kg (222 lb 0.1 oz)    ROS: Review of Systems  Constitutional: Negative for chills and fever.  Eyes: Negative for blurred vision.  Respiratory: Positive for shortness of breath. Negative for cough.   Cardiovascular: Negative for chest pain.  Gastrointestinal: Negative for abdominal pain, constipation, diarrhea, nausea and vomiting.  Genitourinary: Negative for dysuria.  Musculoskeletal: Positive for joint pain.  Neurological: Negative for dizziness, tremors and headaches.    Exam: Physical Exam  HENT:  Nose: No mucosal edema.  Mouth/Throat: No oropharyngeal exudate or posterior oropharyngeal edema.  Eyes: Conjunctivae and lids are normal. Pupils are equal, round, and reactive to light.  Neck: No JVD present. Carotid bruit is not present. No edema present. No thyroid mass and no thyromegaly present.  Cardiovascular: S1 normal, S2 normal and normal heart sounds.  Exam reveals no gallop.   No murmur heard. Pulses:      Dorsalis pedis pulses are 2+ on the right side, and 2+ on the left side.  Respiratory: No respiratory distress. He has decreased breath sounds in the right lower field and the left lower field. He has no wheezes. He has no rhonchi. He has no rales.  GI: Soft. Bowel sounds are normal. There is no tenderness.  Musculoskeletal:       Right ankle: He exhibits swelling.       Left ankle: He exhibits swelling.  Lymphadenopathy:     He has no cervical adenopathy.  Neurological: He is alert.  Able to straight leg raise on the right easier than on the left  Skin: Skin is warm. No rash noted. Nails show no clubbing.  Psychiatric: He has a normal mood and affect.      Data Reviewed: Basic Metabolic Panel:  Recent Labs Lab 10/15/16 0339 10/15/16 1450 10/15/16 2137 10/16/16 0326 10/16/16 2026 10/17/16 0636  NA 139 138 140 139  --  136  K 5.2* 5.8* 5.7* 5.6* 5.0 5.1  CL 107 106 109 109  --  105  CO2 25 24 22 22   --  25  GLUCOSE 131* 119* 127* 148*  --  176*  BUN 48* 47* 47* 46*  --  61*  CREATININE 3.83* 3.75* 3.75* 3.63*  --  3.33*  CALCIUM 8.7* 8.7* 8.9 8.7*  --  8.7*  MG  --   --  2.0  --   --   --   PHOS  --   --  5.3*  --   --   --    CBC:  Recent Labs Lab 10/13/16 2357 10/14/16 0624 10/17/16 0636  WBC 9.3 6.8 10.5  HGB 9.7* 9.4* 9.4*  HCT 29.5* 28.8* 28.2*  MCV 92.2 92.3 93.7  PLT 182 156 171   Cardiac Enzymes:  Recent Labs Lab 10/13/16 2357 10/15/16 1450 10/15/16 2137 10/16/16 0326  TROPONINI 0.03* 0.03* <0.03 <0.03    CBG:  Recent Labs Lab 10/15/16 1149 10/16/16 0715  GLUCAP 115* 145*    Recent Results (from the past 240 hour(s))  Surgical PCR screen     Status: Abnormal   Collection Time: 10/14/16  5:25 AM  Result Value Ref Range Status   MRSA, PCR NEGATIVE NEGATIVE Final   Staphylococcus aureus POSITIVE (A) NEGATIVE Final    Comment:        The Xpert SA Assay (FDA approved for NASAL specimens in patients over 64 years of age), is one component of a comprehensive surveillance program.  Test performance has been validated by [facility] for patients greater than or equal to 39 year old. It is not intended to diagnose infection nor to guide or monitor treatment.   MRSA PCR Screening     Status: None   Collection Time: 10/15/16  3:20 PM  Result Value Ref Range Status   MRSA by PCR NEGATIVE NEGATIVE Final    Comment:        The GeneXpert MRSA Assay  (FDA approved for NASAL specimens only), is one component of a comprehensive MRSA colonization surveillance program. It is not intended to diagnose MRSA infection nor to guide or monitor treatment for MRSA infections.       Scheduled Meds: . apixaban  2.5 mg Oral BID  . Chlorhexidine Gluconate Cloth  6 each Topical Daily  . diltiazem  120 mg Oral Daily  . donepezil  5 mg Oral QHS  . escitalopram  10 mg Oral Daily  . folic acid  1 mg Oral Daily  . ipratropium-albuterol  3 mL Nebulization Q6H  . LORazepam  0-4 mg Oral Q12H  . mouth rinse  15 mL Mouth Rinse BID  . metoprolol succinate  100 mg Oral Daily  . midodrine  10 mg Oral TID  . mirtazapine  15 mg Oral QHS  . multivitamin with minerals  1 tablet Oral Daily  . mupirocin ointment  1 application Nasal BID  . pantoprazole  40 mg Oral Daily  . protein supplement shake  11 oz Oral BID BM  . simvastatin  20 mg Oral QHS  . tamsulosin  0.4 mg Oral QPC supper  . thiamine  100 mg Oral Daily   Continuous Infusions: . sodium chloride      Assessment/Plan:  1. Acute respiratory acidosis with high PCO2.  Last blood gas shows the patient is still acidotic. He is mentating better. Patient was made a DO NOT RESUSCITATE. 2. Acute encephalopathy. Improved from 2 days ago 3. Alcohol abuse. Patient on CIWA protocol. Continue thiamine, folic acid and multivitamin. I talked with the patient at length about stopping alcohol. I told the patient no further driving. 4. Acute kidney injury on chronic kidney disease stage IV.  Appreciate nephrology consultation. Family declined dialysis. Foley placed by nephrology 5. Hyperkalemia. Likely secondary to acute kidney injury 6. BPH on Flomax 7. Chronic atrial fibrillation- Eliquis, diltiazem and metoprolol 8. Hip fracture nonsurgical as per orthopedic surgery. Pain control with Tylenol 9. Twitching left lower extremity. This has resolved. Sometimes happens at night which could be restless leg  syndrome.  Code Status:     Code Status Orders        Start     Ordered   10/14/16 0433  Full code  Continuous     10/14/16 0432    Code Status History    Date Active Date Inactive Code Status Order ID Comments User Context   11/24/2015  5:27 PM 11/26/2015  8:13  PM Full Code [id number]  Epifanio Lesches, MD ED   01/08/2015 11:16 PM 01/10/2015  8:35 PM Full Code [id number]  Earnestine Leys, MD Inpatient   01/07/2015  3:06 PM 01/08/2015 11:16 PM Full Code [id number]  Earnestine Leys, MD ED   08/22/2014  8:47 PM 08/25/2014  5:54 PM Full Code [id number]  Dustin Flock, MD ED    Advance Directive Documentation     Most Recent Value  Type of Advance Directive  Living will  Pre-existing out of facility DNR order (yellow form or pink MOST form)  -  "MOST" Form in Place?  -     Family Communication: Spoke with Family at the bedside Disposition Plan: will need rehabilitation  Consultants:  Critical care specialist  Orthopedic surgery  Time spent: 24 minutes. Case discussed with critical care specialist  [place], [facility]

## 2016-10-17 NOTE — Consult Note (Signed)
[facility] Consult   Reason for Consult:  Consult for 83 year old man with a history of depression and alcohol abuse who is in the hospital because of a fall Referring Physician:  Edwina Barth [name] MRN:  [id number] Principal Diagnosis: Moderate recurrent major depression ([place]) Diagnosis:   Patient Active Problem List   Diagnosis Date Noted  . Hip fracture ([place]) [S72.009A] 10/14/2016  . Moderate recurrent major depression ([place]) [F33.1] 10/14/2016  . Syncope [R55] 11/24/2015  . Depression, major, single episode, severe ([place]) [F32.2] 08/18/2015  . Alcohol abuse [F10.10] 08/18/2015  . Suicidal ideation [R45.851] 08/18/2015  . Bimalleolar fracture of left ankle [S82.842A] 01/07/2015  . COPD exacerbation ([place]) [J44.1] 08/22/2014  . Coronary artery disease [I25.10]   . Chronic atrial fibrillation ([place]) [I48.2]   . Chronic diastolic CHF (congestive heart failure) ([place]) [I50.32]   . Tachy-brady syndrome ([place]) [I49.5]   . CKD (chronic kidney disease), stage III [N18.3] 04/09/2012  . Chronic diastolic heart failure ([place]) [I50.32] 01/26/2012  . Chest pain [786.5] 09/23/2010  . Hyperlipidemia [E78.5] 06/12/2009  . Essential hypertension [I10] 06/12/2009  . Coronary atherosclerosis of artery bypass graft [I25.810] 06/12/2009  . ATRIAL FIBRILLATION [I48.91] 06/12/2009  . AAA [I71.4] 06/12/2009  . COPD [J44.9] 06/12/2009  . GERD [K21.9] 06/12/2009  . PACEMAKER, PERMANENT [Z95.0] 06/12/2009    Total Time spent with patient: 20 minutes  [name].  Follow-up for this 83 year old gentleman with a history of depression and alcohol abuse. Patient got out of the intensive care unit. He was seen in his regular hospital room. He tells me that he is feeling a [place] better. Still not sleeping well at night. Appetite is improved. Not expressing any suicidal ideation  and is able to more clearly express positive plans for the future. Tolerating medicine well. No sign of alcohol withdrawal.  HPI:  Patient interviewed chart reviewed. 83 year old man came into the hospital after a fall in the kitchen that seems to of resulted in a hip fracture. Patient had a positive alcohol level when he came in. Concern has been raised by his family about his ongoing depression and drinking. Patient admits that his mood feels sad and down almost all of the time. He has very [place] in his life that he values. He does still maintain some friendships but doesn't have much of a sense of the future. His son died of cancer at the beginning of this year and he continues to grieve very hard for that. Patient wakes up frequently at night. He says he has an okay appetite now but he admits he is lost a lot of weight over the last few months. Patient is continuing to drink. He drinks he says several days a week. He minimizes the extent to which she believes that it is a problem. Not abusing any other drugs. He says that he and his wife fuss and fight quite a bit mostly about his drinking. [name]  Social history: Patient lives with his wife. 2 living adult children in the area. As mentioned above another son died of cancer earlier this year.  Medical history: Patient currently is in the hospital with a hip fracture.  Substance abuse history: Long-standing problem with drinking. No history of DTs or seizures. He says he's been able to stop at times for months at a time. He knows  that it makes his mood worse but he avoids doing anything about it.  Past Psychiatric History: No history of psychiatric hospitalization. No history of suicide attempt. I seen this patient before under similar circumstances and at that time, last year, he was taking antidepressants. For some reason those don't seem to be part of his medicine regimen anymore. He  has avoided getting actively involved in substance abuse treatment.  Risk to Self: Is patient at risk for suicide?: No Risk to Others:   Prior Inpatient Therapy:   Prior Outpatient Therapy:    Past Medical History:  Past Medical History:  Diagnosis Date  . AAA (abdominal aortic aneurysm) ([place])    a. s/p repair - 1999 @ [facility] @ time of CABG.  . Broken ankle   . Cancer ([place])    skin cancer nose  . Chronic atrial fibrillation (HCC)    a. on pradaxa  . Chronic diastolic CHF (congestive heart failure) ([place])    a. 01/2012 Echo:  EF 55%.  . Chronic obstructive pulmonary disease ([place])   . CKD (chronic kidney disease), stage III    a. Acute on chronic 03/2012  . Coronary artery disease    a. s/p MI x 3;  b. s/p CABG x2 in 1999;  c. Cath 2007: LIMA->LAD patent, VG->OM 100, PCI/DES of RPL/mid RCA (cypher DES).  . Depression   . GERD (gastroesophageal reflux disease)   . History of suicidal ideation   . Hyperlipidemia   . Hypertension   . PVD (peripheral vascular disease) ([place])   . Sleep apnea   . Tachy-brady syndrome ([place])    a. s/p PPM 2010.    Past Surgical History:  Procedure Laterality Date  . ABDOMINAL AORTIC ANEURYSM REPAIR    . ANKLE SURGERY Right   . CORONARY ARTERY BYPASS GRAFT    . FOOT SURGERY     left foot surgery to remove cyst.  . INSERT / REPLACE / REMOVE PACEMAKER    . Left total hip arthroplasty with revision     Dr. Margaretmary Eddy  . ORIF ANKLE FRACTURE Left 01/08/2015   Procedure: OPEN REDUCTION INTERNAL FIXATION (ORIF) ANKLE FRACTURE;  Surgeon: Earnestine Leys, MD;  Location: [facility] ORS;  Service: Orthopedics;  Laterality: Left;  . skin cancer biopsy     nose  . TRIGGER FINGER RELEASE     Family History:  Family History  Problem Relation Age of Onset  . Stomach cancer Mother        died @ 33  . Diabetes Mother   . Hypertension Mother   . Coronary artery disease Mother   . Cerebral aneurysm Father        died @ 55  . Diabetes Father   . Hypertension Father    . Coronary artery disease Father    Family Psychiatric  History: Denies any family history Social History:  History  Alcohol Use No    Comment: 1 bottle of bourbon a week      History  Drug Use No    Social History   Social History  . Marital status: Married    Spouse name: N/A  . Number of children: N/A  . Years of education: N/A   Occupational History  . retired    Social History Main Topics  . Smoking status: Former Smoker    Packs/day: 1.00    Years: 20.00    Quit date: 04/11/1997  . Smokeless tobacco: Never Used  . Alcohol use No  Comment: 1 bottle of bourbon a week   . Drug use: No  . Sexual activity: Not Asked   Other Topics Concern  . None   Social History Narrative   Lives locally with his wife.  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     Additional Social History:    Allergies:   Allergies  Allergen Reactions  . Lipitor [Atorvastatin] Other (See Comments)    Arm pain    Labs:  Results for orders placed or performed during the hospital encounter of 10/13/16 (from the past 48 hour(s))  Troponin I (q 6hr x 3)     Status: None   Collection Time: 10/15/16  9:37 PM  Result Value Ref Range   Troponin I <0.03 <0.03 ng/mL  Basic metabolic panel     Status: Abnormal   Collection Time: 10/15/16  9:37 PM  Result Value Ref Range   Sodium 140 135 - 145 mmol/L   Potassium 5.7 (H) 3.5 - 5.1 mmol/L   Chloride 109 101 - 111 mmol/L   CO2 22 22 - 32 mmol/L   Glucose, Bld 127 (H) 65 - 99 mg/dL   BUN 47 (H) 6 - 20 mg/dL   Creatinine, Ser 3.75 (H) 0.61 - 1.24 mg/dL   Calcium 8.9 8.9 - 10.3 mg/dL   GFR calc non Af Amer 14 (L) >60 mL/min   GFR calc Af Amer 16 (L) >60 mL/min    Comment: (NOTE) The eGFR has been calculated using the CKD EPI equation. This calculation has not been validated in all clinical situations. eGFR's persistently <60 mL/min signify possible Chronic Kidney Disease.    Anion gap 9 5 - 15  Magnesium     Status: None    Collection Time: 10/15/16  9:37 PM  Result Value Ref Range   Magnesium 2.0 1.7 - 2.4 mg/dL  Phosphorus     Status: Abnormal   Collection Time: 10/15/16  9:37 PM  Result Value Ref Range   Phosphorus 5.3 (H) 2.5 - 4.6 mg/dL  Troponin I (q 6hr x 3)     Status: None   Collection Time: 10/16/16  3:26 AM  Result Value Ref Range   Troponin I <0.03 <0.03 ng/mL  Basic metabolic panel     Status: Abnormal   Collection Time: 10/16/16  3:26 AM  Result Value Ref Range   Sodium 139 135 - 145 mmol/L   Potassium 5.6 (H) 3.5 - 5.1 mmol/L   Chloride 109 101 - 111 mmol/L   CO2 22 22 - 32 mmol/L   Glucose, Bld 148 (H) 65 - 99 mg/dL   BUN 46 (H) 6 - 20 mg/dL   Creatinine, Ser 3.63 (H) 0.61 - 1.24 mg/dL   Calcium 8.7 (L) 8.9 - 10.3 mg/dL   GFR calc non Af Amer 14 (L) >60 mL/min   GFR calc Af Amer 17 (L) >60 mL/min    Comment: (NOTE) The eGFR has been calculated using the CKD EPI equation. This calculation has not been validated in all clinical situations. eGFR's persistently <60 mL/min signify possible Chronic Kidney Disease.    Anion gap 8 5 - 15  Ammonia     Status: None   Collection Time: 10/16/16  3:26 AM  Result Value Ref Range   Ammonia 12 9 - 35 umol/L  Blood gas, arterial     Status: Abnormal   Collection Time: 10/16/16  5:00 AM  Result Value Ref Range   FIO2 0.28    Delivery  systems VENTILATOR    Mode BILEVEL POSITIVE AIRWAY PRESSURE    Inspiratory PAP 16    Expiratory PAP 6    pH, Arterial 7.17 (LL) 7.350 - 7.450    Comment: TONYA BROWN, RRT 10/17/2015 0555   pCO2 arterial 59 (H) 32.0 - 48.0 mmHg   pO2, Arterial 102 83.0 - 108.0 mmHg   Bicarbonate 21.5 20.0 - 28.0 mmol/L   Acid-base deficit 7.4 (H) 0.0 - 2.0 mmol/L   O2 Saturation 96.0 %   Patient temperature 37.0    Collection site RIGHT RADIAL    Sample type ARTERIAL DRAW    Allens test (pass/fail) PASS PASS   Mechanical Rate 8   Glucose, capillary     Status: Abnormal   Collection Time: 10/16/16  7:15 AM  Result Value  Ref Range   Glucose-Capillary 145 (H) 65 - 99 mg/dL  Lactic acid, plasma     Status: None   Collection Time: 10/16/16  7:51 AM  Result Value Ref Range   Lactic Acid, Venous 0.8 0.5 - 1.9 mmol/L  Blood gas, arterial     Status: Abnormal   Collection Time: 10/16/16  8:00 AM  Result Value Ref Range   FIO2 0.28    Mode BILEVEL POSITIVE AIRWAY PRESSURE    Inspiratory PAP 18    Expiratory PAP 6    pH, Arterial 7.19 (LL) 7.350 - 7.450    Comment: CRITICAL RESULT, NOTIFIED PHYSICIAN DR CONFORTI [id number] ON [id number]    pCO2 arterial 55 (H) 32.0 - 48.0 mmHg   pO2, Arterial 91 83.0 - 108.0 mmHg   Bicarbonate 21.0 20.0 - 28.0 mmol/L   Acid-base deficit 7.4 (H) 0.0 - 2.0 mmol/L   O2 Saturation 94.7 %   Patient temperature 37.0    Collection site RIGHT RADIAL    Sample type ARTERIAL DRAW    Allens test (pass/fail) PASS PASS  Potassium     Status: None   Collection Time: 10/16/16  8:26 PM  Result Value Ref Range   Potassium 5.0 3.5 - 5.1 mmol/L  CBC     Status: Abnormal   Collection Time: 10/17/16  6:36 AM  Result Value Ref Range   WBC 10.5 3.8 - 10.6 K/uL   RBC 3.01 (L) 4.40 - 5.90 MIL/uL   Hemoglobin 9.4 (L) 13.0 - 18.0 g/dL   HCT 28.2 (L) 40.0 - 52.0 %   MCV 93.7 80.0 - 100.0 fL   MCH 31.2 26.0 - 34.0 pg   MCHC 33.4 32.0 - 36.0 g/dL   RDW 16.4 (H) 11.5 - 14.5 %   Platelets 171 150 - 440 K/uL  Basic metabolic panel     Status: Abnormal   Collection Time: 10/17/16  6:36 AM  Result Value Ref Range   Sodium 136 135 - 145 mmol/L   Potassium 5.1 3.5 - 5.1 mmol/L   Chloride 105 101 - 111 mmol/L   CO2 25 22 - 32 mmol/L   Glucose, Bld 176 (H) 65 - 99 mg/dL   BUN 61 (H) 6 - 20 mg/dL   Creatinine, Ser 3.33 (H) 0.61 - 1.24 mg/dL   Calcium 8.7 (L) 8.9 - 10.3 mg/dL   GFR calc non Af Amer 16 (L) >60 mL/min   GFR calc Af Amer 19 (L) >60 mL/min    Comment: (NOTE) The eGFR has been calculated using the CKD EPI equation. This calculation has not been validated in all clinical  situations. eGFR's persistently <60 mL/min signify possible Chronic Kidney Disease.  Anion gap 6 5 - 15    Current Facility-Administered Medications  Medication Dose Route Frequency Provider Last Rate Last Dose  . acetaminophen (TYLENOL) tablet 650 mg  650 mg Oral Q6H PRN Saundra Shelling, MD       Or  . acetaminophen (TYLENOL) suppository 650 mg  650 mg Rectal Q6H PRN Pyreddy, Reatha Harps, MD      . apixaban (ELIQUIS) tablet 2.5 mg  2.5 mg Oral BID Baxter Hire, MD   2.5 mg at 10/17/16 1110  . Chlorhexidine Gluconate Cloth 2 % PADS 6 each  6 each Topical Daily Loletha Grayer, MD   6 each at 10/16/16 2150  . diltiazem (CARDIZEM CD) 24 hr capsule 120 mg  120 mg Oral Daily Pyreddy, Pavan, MD   120 mg at 10/17/16 1111  . donepezil (ARICEPT) tablet 5 mg  5 mg Oral QHS Saundra Shelling, MD   5 mg at 10/16/16 2145  . escitalopram (LEXAPRO) tablet 10 mg  10 mg Oral Daily Lunetta Marina, Madie Reno, MD   10 mg at 10/17/16 1112  . folic acid (FOLVITE) tablet 1 mg  1 mg Oral Daily Pyreddy, Pavan, MD   1 mg at 10/17/16 1110  . guaiFENesin (MUCINEX) 12 hr tablet 600 mg  600 mg Oral BID PRN Saundra Shelling, MD      . HYDROcodone-acetaminophen (NORCO/VICODIN) 5-325 MG per tablet 1-2 tablet  1-2 tablet Oral Q4H PRN Mikael Spray, NP   1 tablet at 10/16/16 2328  . ipratropium-albuterol (DUONEB) 0.5-2.5 (3) MG/3ML nebulizer solution 3 mL  3 mL Nebulization Q6H Conforti, Florella Mcneese, DO   3 mL at 10/17/16 1339  . LORazepam (ATIVAN) tablet 0-4 mg  0-4 mg Oral Q12H Pyreddy, Pavan, MD      . MEDLINE mouth rinse  15 mL Mouth Rinse BID Saundra Shelling, MD   15 mL at 10/16/16 2146  . metoprolol succinate (TOPROL-XL) 24 hr tablet 100 mg  100 mg Oral Daily Pyreddy, Pavan, MD   100 mg at 10/17/16 1109  . midodrine (PROAMATINE) tablet 10 mg  10 mg Oral TID Saundra Shelling, MD   10 mg at 10/17/16 1711  . mirtazapine (REMERON) tablet 15 mg  15 mg Oral QHS [place] Spainhower, Madie Reno, MD   15 mg at 10/16/16 2145  . multivitamin with minerals tablet  1 tablet  1 tablet Oral Daily Saundra Shelling, MD   1 tablet at 10/17/16 1109  . mupirocin ointment (BACTROBAN) 2 % 1 application  1 application Nasal BID Loletha Grayer, MD   1 application at 21/30/86 2146  . nitroGLYCERIN (NITROSTAT) SL tablet 0.4 mg  0.4 mg Sublingual Q5 min PRN Pyreddy, Reatha Harps, MD      . ondansetron (ZOFRAN) tablet 4 mg  4 mg Oral Q6H PRN Pyreddy, Reatha Harps, MD       Or  . ondansetron (ZOFRAN) injection 4 mg  4 mg Intravenous Q6H PRN Pyreddy, Pavan, MD      . pantoprazole (PROTONIX) EC tablet 40 mg  40 mg Oral Daily Pyreddy, Reatha Harps, MD   40 mg at 10/17/16 1110  . protein supplement (PREMIER PROTEIN) liquid  11 oz Oral BID BM Baxter Hire, MD   11 oz at 10/17/16 1000  . senna-docusate (Senokot-S) tablet 1 tablet  1 tablet Oral QHS PRN Saundra Shelling, MD      . simvastatin (ZOCOR) tablet 20 mg  20 mg Oral QHS Saundra Shelling, MD   20 mg at 10/16/16 2145  . sodium chloride 0.9 % bolus  1,000 mL  1,000 mL Intravenous Once Dorene Sorrow S, NP      . tamsulosin (FLOMAX) capsule 0.4 mg  0.4 mg Oral QPC supper Saundra Shelling, MD   0.4 mg at 10/17/16 1711  . thiamine (VITAMIN B-1) tablet 100 mg  100 mg Oral Daily Pyreddy, Reatha Harps, MD   100 mg at 10/17/16 1110    Musculoskeletal: Strength & Muscle Tone: within normal limits Gait & Station: unable to stand Patient leans: N/A  Psychiatric Specialty Exam: Physical Exam  Nursing note and vitals reviewed. Constitutional: He appears well-developed and well-nourished.  HENT:  Head: Normocephalic and atraumatic.  Eyes: Conjunctivae are normal. Pupils are equal, round, and reactive to light.  Neck: Normal range of motion.  Cardiovascular: Regular rhythm and normal heart sounds.   Respiratory: Effort normal. No respiratory distress.  GI: Soft.  Musculoskeletal: Normal range of motion.  Neurological: He is alert.  Skin: Skin is warm and dry.  Psychiatric: Judgment normal. His affect is not blunt. His speech is not delayed. He is  slowed. Thought content is not paranoid. Cognition and memory are normal. He does not express inappropriate judgment. He expresses no suicidal ideation.    Review of Systems  Constitutional: Positive for malaise/fatigue and weight loss.  HENT: Negative.   Eyes: Negative.   Respiratory: Negative.   Cardiovascular: Negative.   Gastrointestinal: Negative.   Musculoskeletal: Negative.   Skin: Negative.   Neurological: Positive for weakness.  Psychiatric/Behavioral: Positive for depression. Negative for hallucinations, memory loss, substance abuse and suicidal ideas. The patient has insomnia. The patient is not nervous/anxious.     Blood pressure (!) 127/51, pulse 64, temperature 98.7 F (37.1 C), temperature source Oral, resp. rate 16, height 6' 1"  (1.854 m), weight 100.7 kg (222 lb 0.1 oz), SpO2 97 %.Body mass index is 29.29 kg/m.  General Appearance: Casual  Eye Contact:  Good  Speech:  Slow  Volume:  Normal  Mood:  Euthymic  Affect:  Congruent  Thought Process:  Goal Directed  Orientation:  Full (Time, Place, and Person)  Thought Content:  Logical  Suicidal Thoughts:  No  Homicidal Thoughts:  No  Memory:  Immediate;   Good Recent;   Fair Remote;   Fair  Judgement:  Fair  Insight:  Shallow  Psychomotor Activity:  Decreased  Concentration:  Concentration: Fair  Recall:  [employer] of Knowledge:  Fair  Language:  Fair  Akathisia:  No  Handed:  Right  AIMS (if indicated):     Assets:  Financial Resources/Insurance Housing Resilience Social Support  ADL's:  Impaired  Cognition:  Impaired,  Mild  Sleep:        Treatment Plan Summary: Daily contact with patient to assess and evaluate symptoms and progress in treatment, Medication management and Plan Follow-up for this 83 year old gentleman. Today he looks quite a bit better than he did last week. Affect is brighter. Better eye contact. More spontaneously positive about the future. Able to give an actual opinion about which  rehabilitation he would prefer to go to. Tolerating medicine well. No changes on medication for today. Continue treatment plan and continue antidepressants outside the hospital.  Disposition: Patient does not meet criteria for psychiatric inpatient admission. Supportive therapy provided about ongoing stressors. Discussed crisis plan, support from social network, calling 911, coming to the Emergency Department, and calling Suicide Hotline.  Alethia Berthold, MD 10/17/2016 6:03 PM

## 2016-10-17 NOTE — Progress Notes (Signed)
[facility] Progess Note    SYNOPSIS   83 y.o.malewith a known history of Abdominal aortic aneurysm , nasal cancer, chronic atrial fibrillation systemic anticoagulation, coronary artery disease, COPD, chronic kidney disease stage III, diastolic heart failure, peripheral vascular disease presented to the emergency room with left hip pain.Patient drank alcohol and lost balance and fell down at home.  Patient had hip surgery on the left side and has a prosthesis in the past.Imaging studies today revealed fracture of the prosthesis.Orthopedic surgery was consulted by ER physician.   ASSESSMENT/PLAN   Hypercapnic respiratory failure. Patient does have a history of COPD, is presently on Solu-Medrol, albuterol, Atrovent. He is on systemic anticoagulation, with his creatinine elevated unable to perform P ACT, VQ scan secondary to noninvasive requirements. OFF BiPAP. Now DNR/DNI  Renal failure acute on chronic-mild improvement in creatinine level. Continue to trend  Hyperkalemia: Resolved.  History of tachybradycardia syndrome status post pacemaker  Anemia no evidence of active bleeding  Periprosthetic hip fracture, being followed by orthopedic surgery  History of coronary artery disease. Cardiac enzymes are negative  Stable for transfer out of the ICU  Name: [name] MRN: [id number] DOB: Aug 01, 1933    ADMISSION DATE:  10/13/2016  SUBJECTIVE:  No significant change in status, patient still requiring noninvasive ventilation, pH has fluctuated anywhere from 7.18-7.3. Patient however is easily arousable and communicating  VITAL SIGNS: Temp:  [97.5 F (36.4 C)-97.9 F (36.6 C)] 97.5 F (36.4 C) (07/09 0400) Pulse Rate:  [61-90] 90 (07/09 0400) Resp:  [11-20] 18 (07/08 1800) BP: (117-157)/(58-76) 129/74 (07/09 0400) SpO2:  [92 %-99 %] 94 % (07/09 0400) FiO2 (%):  [28 %] 28 % (07/08 0734)  PHYSICAL EXAMINATION: Physical Examination:   VS: BP 129/74    Pulse 90   Temp (!) 97.5 F (36.4 C) (Oral)   Resp 18   Ht 6\' 1"  (1.854 m)   Wt 222 lb 0.1 oz (100.7 kg)   SpO2 94%   BMI 29.29 kg/m   General Appearance: No distress  Neuro:aaox 3, without focal findings, moves all extremities HEENT: PERRLA, EOM intact. Presently on noninvasive ventilation Pulmonary: Diminished breath sounds but clear Cardiovascular  rhythm is regular, paced on the monitor Abdomen: Benign, Soft, non-tender. Skin:   warm, no rashes, no ecchymosis  Extremities: normal, no cyanosis, clubbing.    LABORATORY PANEL:   CBC  Recent Labs Lab 10/14/16 0624  WBC 6.8  HGB 9.4*  HCT 28.8*  PLT 156    Chemistries   Recent Labs Lab 10/15/16 2137 10/16/16 0326 10/16/16 2026  NA 140 139  --   K 5.7* 5.6* 5.0  CL 109 109  --   CO2 22 22  --   GLUCOSE 127* 148*  --   BUN 47* 46*  --   CREATININE 3.75* 3.63*  --   CALCIUM 8.9 8.7*  --   MG 2.0  --   --   PHOS 5.3*  --   --      Recent Labs Lab 10/15/16 1149 10/16/16 0715  GLUCAP 115* 145*    Recent Labs Lab 10/15/16 1555 10/16/16 0500 10/16/16 0800  PHART 7.22* 7.17* 7.19*  PCO2ART 58* 59* 55*  PO2ART 74* 102 91   No results for input(s): AST, ALT, ALKPHOS, BILITOT, ALBUMIN in the last 168 hours.  Cardiac Enzymes  Recent Labs Lab 10/16/16 0326  TROPONINI <0.03    RADIOLOGY:  No results found.   Blase Beckner S. Tukov ANP-BC Pulmonary and  [place] Pager [phone] or [phone]  10/17/2016

## 2016-10-17 NOTE — Progress Notes (Addendum)
Patient was transferred from CCU to room 159. A&O x 4. Foley and SCDs in place. Tele placed. Oriented to room, call light, TV and bed controls. Bed alarm on for safety. Notified MD of need for PT order.

## 2016-10-17 NOTE — Progress Notes (Signed)
[facility]  ROUNDING NOTE   Subjective:   [name] admitted to [facility] on Fall [W19.XXXA] Left hip pain [M25.552] Closed fracture of left hip, initial encounter ([place]) [S72.002A] Anemia, unspecified type [D64.9]   Overall he is doing fair. Family and friends at bedside. Urine output has improved to 1100 cc. Serum creatinine slightly lower to 3.33 down from 3.63. Patient is able to eat his breakfast. No nausea or vomiting. No shortness of breath. No leg edema.   .   Objective:  Vital signs in last 24 hours:  Temp:  [97.5 F (36.4 C)-98.9 F (37.2 C)] 98.9 F (37.2 C) (07/09 0747) Pulse Rate:  [61-90] 66 (07/09 0800) Resp:  [11-20] 15 (07/09 0800) BP: (111-157)/(58-87) 111/87 (07/09 0800) SpO2:  [90 %-98 %] 90 % (07/09 0800)  Weight change:  Filed Weights   10/13/16 2342 10/15/16 1500  Weight: 97.5 kg (215 lb) 100.7 kg (222 lb 0.1 oz)    Intake/Output: I/O last 3 completed shifts: In: 2870 [P.O.:720; I.V.:1150; IV Piggyback:1000] Out: 1475 [Urine:1475]   Intake/Output this shift:  Total I/O In: -  Out: 280 [Urine:280]  Physical Exam: General: No acute distress, laying in the bed       HEENT: Anicteric, Moist oral mucous membranes   Neck: Supple,    Lungs:  Clear, Coarse breath sounds at bases. Normal effort   Heart: Paced rhythm.   Abdomen:  Soft, nontender, obese, Mildly distended   Extremities: no peripheral edema.  Neurologic: Alert, oriented, able to answer questions   Skin: No Acute lesions       Basic Metabolic Panel:  Recent Labs Lab 10/15/16 0339 10/15/16 1450 10/15/16 2137 10/16/16 0326 10/16/16 2026 10/17/16 0636  NA 139 138 140 139  --  136  K 5.2* 5.8* 5.7* 5.6* 5.0 5.1  CL 107 106 109 109  --  105  CO2 25 24 22 22   --  25  GLUCOSE 131* 119* 127* 148*  --  176*  BUN 48* 47* 47* 46*  --  61*  CREATININE 3.83* 3.75* 3.75* 3.63*  --  3.33*  CALCIUM 8.7* 8.7* 8.9 8.7*  --  8.7*  MG  --   --  2.0  --   --   --    PHOS  --   --  5.3*  --   --   --     Liver Function Tests: No results for input(s): AST, ALT, ALKPHOS, BILITOT, PROT, ALBUMIN in the last 168 hours. No results for input(s): LIPASE, AMYLASE in the last 168 hours.  Recent Labs Lab 10/16/16 0326  AMMONIA 12    CBC:  Recent Labs Lab 10/13/16 2357 10/14/16 0624 10/17/16 0636  WBC 9.3 6.8 10.5  HGB 9.7* 9.4* 9.4*  HCT 29.5* 28.8* 28.2*  MCV 92.2 92.3 93.7  PLT 182 156 171    Cardiac Enzymes:  Recent Labs Lab 10/13/16 2357 10/15/16 1450 10/15/16 2137 10/16/16 0326  TROPONINI 0.03* 0.03* <0.03 <0.03    BNP: Invalid input(s): POCBNP  CBG:  Recent Labs Lab 10/15/16 1149 10/16/16 0715  GLUCAP 115* 145*    Microbiology: Results for orders placed or performed during the hospital encounter of 10/13/16  Surgical PCR screen     Status: Abnormal   Collection Time: 10/14/16  5:25 AM  Result Value Ref Range Status   MRSA, PCR NEGATIVE NEGATIVE Final   Staphylococcus aureus POSITIVE (A) NEGATIVE Final    Comment:  The Xpert SA Assay (FDA approved for NASAL specimens in patients over 46 years of age), is one component of a comprehensive surveillance program.  Test performance has been validated by [facility] for patients greater than or equal to 36 year old. It is not intended to diagnose infection nor to guide or monitor treatment.   MRSA PCR Screening     Status: None   Collection Time: 10/15/16  3:20 PM  Result Value Ref Range Status   MRSA by PCR NEGATIVE NEGATIVE Final    Comment:        The GeneXpert MRSA Assay (FDA approved for NASAL specimens only), is one component of a comprehensive MRSA colonization surveillance program. It is not intended to diagnose MRSA infection nor to guide or monitor treatment for MRSA infections.     Coagulation Studies: No results for input(s): LABPROT, INR in the last 72 hours.  Urinalysis: No results for input(s): COLORURINE, LABSPEC, PHURINE,  GLUCOSEU, HGBUR, BILIRUBINUR, KETONESUR, PROTEINUR, UROBILINOGEN, NITRITE, LEUKOCYTESUR in the last 72 hours.  Invalid input(s): APPERANCEUR    Imaging: No results found.   Medications:   . sodium chloride     . apixaban  2.5 mg Oral BID  . Chlorhexidine Gluconate Cloth  6 each Topical Daily  . diltiazem  120 mg Oral Daily  . donepezil  5 mg Oral QHS  . escitalopram  10 mg Oral Daily  . folic acid  1 mg Oral Daily  . ipratropium-albuterol  3 mL Nebulization Q6H  . LORazepam  0-4 mg Oral Q12H  . mouth rinse  15 mL Mouth Rinse BID  . methylPREDNISolone (SOLU-MEDROL) injection  60 mg Intravenous Q6H  . metoprolol succinate  100 mg Oral Daily  . midodrine  10 mg Oral TID  . mirtazapine  15 mg Oral QHS  . multivitamin with minerals  1 tablet Oral Daily  . mupirocin ointment  1 application Nasal BID  . pantoprazole  40 mg Oral Daily  . protein supplement shake  11 oz Oral BID BM  . simvastatin  20 mg Oral QHS  . tamsulosin  0.4 mg Oral QPC supper  . thiamine  100 mg Oral Daily   acetaminophen **OR** acetaminophen, guaiFENesin, HYDROcodone-acetaminophen, nitroGLYCERIN, ondansetron **OR** ondansetron (ZOFRAN) IV, senna-docusate  Assessment/ Plan:  [name] is a 83 y.o. white male with Diastolic Congestive Heart Failure, coronary artery disease status post CABG and stents, AAA status post repair, atrial fibrillation, sick sinus status post pacemaker, COPD, GERD, peripheral arter disease, Hypertension, Dupuytren's contracture and history of alcohol abuse admitted to [facility] on 10/13/2016   Nephrology consulted for acute renal failure on chronic kidney disease stage IV  1. Acute Renal Failure with hyperkalemia on chronic kidney disease stage IV with baseline creatinine of 3.16 GFR of 17 on 07/22/16 - Foley catheter in place - Serum creatinine improved slightly. Urine output 1100 cc. No acute indication for dialysis at present.  2. Acidosis: Hypercapnic respiratory acidosis  -  Appreciate pulm input - requiring BiPAP  3. Diastolic congestive heart failure: not in acute exacerbation.  - monitor closely. Discontinue IVF - Holding torsemide  4. Anemia of chronic kidney disease: hemoglobin 9.4  5. Depression and alcohol abuse. Evaluated by psychiatrist. Antidepressants restarted.  6. Status post fall, left periprosthetic hip fracture minimally displaced, patient with alcohol abuse PT recommends discharge to nursing home   LOS: 3 Magaret Justo 7/9/20189:27 AM

## 2016-10-18 LAB — BASIC METABOLIC PANEL
ANION GAP: 6 (ref 5–15)
BUN: 78 mg/dL — ABNORMAL HIGH (ref 6–20)
CALCIUM: 8.8 mg/dL — AB (ref 8.9–10.3)
CHLORIDE: 106 mmol/L (ref 101–111)
CO2: 25 mmol/L (ref 22–32)
CREATININE: 2.95 mg/dL — AB (ref 0.61–1.24)
GFR calc non Af Amer: 19 mL/min — ABNORMAL LOW (ref >60)
GFR, EST AFRICAN AMERICAN: 21 mL/min — AB (ref >60)
Glucose, Bld: 158 mg/dL — ABNORMAL HIGH (ref 65–99)
Potassium: 5.2 mmol/L — ABNORMAL HIGH (ref 3.5–5.1)
SODIUM: 137 mmol/L (ref 135–145)

## 2016-10-18 MED ORDER — SODIUM POLYSTYRENE SULFONATE 15 GM/60ML PO SUSP
30.0000 g | Freq: Once | ORAL | Status: AC
  Administered 2016-10-18: 30 g via ORAL
  Filled 2016-10-18: qty 120

## 2016-10-18 NOTE — Progress Notes (Signed)
ORTHOPAEDICS: The patient was resting well this morning.  Recently transferred back to the floor from the ICU following respiratory issues.  Physical therapy was suspended while the patient was in the ICU. He complains of some discomfort to the left hip with active movement but has essentially been at bedrest since admission. Resume physical therapy for bed mobility and transfers if okay with Medicine.  Appreciate Psychiatry consult.  James P. Holley Bouche M.D.

## 2016-10-18 NOTE — Progress Notes (Signed)
Pt. Flatly refuses to wear BIPAP. States that he had a machine at home but had to turn it in because he will not wear it.

## 2016-10-18 NOTE — Progress Notes (Signed)
[name]  ROUNDING NOTE   Subjective:   [name] admitted to [facility] for Fall [W19.XXXA] Left hip pain [M25.552] Closed fracture of left hip, initial encounter ([name]) [S72.002A] Anemia, unspecified type [D64.9]   Overall he is doing fair. Family and friends at bedside. Urine output has improved to 2080 cc. Serum creatinine slightly lower to 2.95 from 3.33  Patient is able to eat his breakfast. No nausea or vomiting. No shortness of breath. No leg edema.   .   Objective:  Vital signs in last 24 hours:  Temp:  [97.6 F (36.4 C)-98.7 F (37.1 C)] 98.7 F (37.1 C) (07/10 0830) Pulse Rate:  [60-64] 62 (07/10 0830) Resp:  [16-20] 18 (07/10 0830) BP: (127-140)/(51-83) 136/67 (07/10 0830) SpO2:  [92 %-100 %] 96 % (07/10 1405)  Weight change:  Filed Weights   10/13/16 2342 10/15/16 1500  Weight: 97.5 kg (215 lb) 100.7 kg (222 lb 0.1 oz)    Intake/Output: I/O last 3 completed shifts: In: 480 [P.O.:480] Out: 2680 [Urine:2680]   Intake/Output this shift:  Total I/O In: 360 [P.O.:360] Out: -   Physical Exam: General: No acute distress,sitting up in recliner chair      HEENT: Anicteric, Moist oral mucous membranes   Neck: Supple,    Lungs:  Clear, Normal effort   Heart: Paced rhythm.   Abdomen:  Soft, nontender, obese, Mildly distended   Extremities: no peripheral edema.  Neurologic: Resting quietly  Skin: No Acute lesions       Basic Metabolic Panel:  Recent Labs Lab 10/15/16 1450 10/15/16 2137 10/16/16 0326 10/16/16 2026 10/17/16 0636 10/18/16 0757  NA 138 140 139  --  136 137  K 5.8* 5.7* 5.6* 5.0 5.1 5.2*  CL 106 109 109  --  105 106  CO2 24 22 22   --  25 25  GLUCOSE 119* 127* 148*  --  176* 158*  BUN 47* 47* 46*  --  61* 78*  CREATININE 3.75* 3.75* 3.63*  --  3.33* 2.95*  CALCIUM 8.7* 8.9 8.7*  --  8.7* 8.8*  MG  --  2.0  --   --   --   --   PHOS  --  5.3*  --   --   --   --     Liver Function Tests: No results for  input(s): AST, ALT, ALKPHOS, BILITOT, PROT, ALBUMIN in the last 168 hours. No results for input(s): LIPASE, AMYLASE in the last 168 hours.  Recent Labs Lab 10/16/16 0326  AMMONIA 12    CBC:  Recent Labs Lab 10/13/16 2357 10/14/16 0624 10/17/16 0636  WBC 9.3 6.8 10.5  HGB 9.7* 9.4* 9.4*  HCT 29.5* 28.8* 28.2*  MCV 92.2 92.3 93.7  PLT 182 156 171    Cardiac Enzymes:  Recent Labs Lab 10/13/16 2357 10/15/16 1450 10/15/16 2137 10/16/16 0326  TROPONINI 0.03* 0.03* <0.03 <0.03    BNP: Invalid input(s): POCBNP  CBG:  Recent Labs Lab 10/15/16 1149 10/16/16 0715  GLUCAP 115* 145*    Microbiology: Results for orders placed or performed during the hospital encounter of 10/13/16  Surgical PCR screen     Status: Abnormal   Collection Time: 10/14/16  5:25 AM  Result Value Ref Range Status   MRSA, PCR NEGATIVE NEGATIVE Final   Staphylococcus aureus POSITIVE (A) NEGATIVE Final    Comment:        The Xpert SA Assay (FDA approved for NASAL specimens in patients over 21  years of age), is one component of a comprehensive surveillance program.  Test performance has been validated by [facility] for patients greater than or equal to 64 year old. It is not intended to diagnose infection nor to guide or monitor treatment.   MRSA PCR Screening     Status: None   Collection Time: 10/15/16  3:20 PM  Result Value Ref Range Status   MRSA by PCR NEGATIVE NEGATIVE Final    Comment:        The GeneXpert MRSA Assay (FDA approved for NASAL specimens only), is one component of a comprehensive MRSA colonization surveillance program. It is not intended to diagnose MRSA infection nor to guide or monitor treatment for MRSA infections.     Coagulation Studies: No results for input(s): LABPROT, INR in the last 72 hours.  Urinalysis: No results for input(s): COLORURINE, LABSPEC, PHURINE, GLUCOSEU, HGBUR, BILIRUBINUR, KETONESUR, PROTEINUR, UROBILINOGEN, NITRITE,  LEUKOCYTESUR in the last 72 hours.  Invalid input(s): APPERANCEUR    Imaging: No results found.   Medications:   . sodium chloride     . apixaban  2.5 mg Oral BID  . Chlorhexidine Gluconate Cloth  6 each Topical Daily  . diltiazem  120 mg Oral Daily  . donepezil  5 mg Oral QHS  . escitalopram  10 mg Oral Daily  . folic acid  1 mg Oral Daily  . ipratropium-albuterol  3 mL Nebulization Q6H  . mouth rinse  15 mL Mouth Rinse BID  . metoprolol succinate  100 mg Oral Daily  . midodrine  10 mg Oral TID  . mirtazapine  15 mg Oral QHS  . multivitamin with minerals  1 tablet Oral Daily  . mupirocin ointment  1 application Nasal BID  . pantoprazole  40 mg Oral Daily  . protein supplement shake  11 oz Oral BID BM  . simvastatin  20 mg Oral QHS  . sodium polystyrene  30 g Oral Once  . tamsulosin  0.4 mg Oral QPC supper  . thiamine  100 mg Oral Daily   acetaminophen **OR** acetaminophen, guaiFENesin, HYDROcodone-acetaminophen, nitroGLYCERIN, ondansetron **OR** ondansetron (ZOFRAN) IV, senna-docusate  Assessment/ Plan:  [name] is a 83 y.o. white male with Diastolic Congestive Heart Failure, coronary artery disease status post CABG and stents, AAA status post repair, atrial fibrillation, sick sinus status post pacemaker, COPD, GERD, peripheral arter disease, Hypertension, Dupuytren's contracture and history of alcohol abuse admitted to [facility] on 10/13/2016   Nephrology consulted for acute renal failure on chronic kidney disease stage IV  1. Acute Renal Failure with hyperkalemia on chronic kidney disease stage IV with baseline creatinine of 3.16 GFR of 17 on 07/22/16  - Serum creatinine improved slightly. Urine output >2000  cc.  No further need for HD is anticipated.  2. Acidosis: Hypercapnic respiratory acidosis  -  Follow pulm recommendations  3. Diastolic congestive heart failure: not in acute exacerbation.  - monitor closely.   - No diuretics/ fluids  4. Anemia of  chronic kidney disease:  hemoglobin 9.4  5. Depression and alcohol abuse. Evaluated by psychiatrist. Antidepressants restarted.  6. Status post fall, left periprosthetic hip fracture minimally displaced, patient with alcohol abuse PT recommends discharge to nursing home   LOS: 4 Pretty Weltman 7/10/20182:53 PM

## 2016-10-18 NOTE — Progress Notes (Signed)
Patient ID: [name], male   DOB: 25-Nov-1933, 83 y.o.   MRN: [id number]   [employer] PROGRESS NOTE  [name]:[id number] DOB: Sep 11, 1933 DOA: 10/13/2016 PCP: Maryland Pink, MD  HPI/Subjective: Patient less talkative today than yesterday. As per nurse was more lethargic this morning. Needed a lot of help in order to get into the chair. Family very concerned that he wants to drive when he eventually gets home.  Objective: Vitals:   10/18/16 0830 10/18/16 1515  BP: 136/67 (!) 120/59  Pulse: 62 60  Resp: 18   Temp: 98.7 F (37.1 C) 98.4 F (36.9 C)    Filed Weights   10/13/16 2342 10/15/16 1500  Weight: 97.5 kg (215 lb) 100.7 kg (222 lb 0.1 oz)    ROS: Review of Systems  Constitutional: Negative for chills and fever.  Eyes: Negative for blurred vision.  Respiratory: Positive for shortness of breath. Negative for cough.   Cardiovascular: Negative for chest pain.  Gastrointestinal: Negative for abdominal pain, constipation, diarrhea, nausea and vomiting.  Genitourinary: Negative for dysuria.  Musculoskeletal: Positive for joint pain.  Neurological: Positive for tremors. Negative for dizziness and headaches.    Exam: Physical Exam  HENT:  Nose: No mucosal edema.  Mouth/Throat: No oropharyngeal exudate or posterior oropharyngeal edema.  Eyes: Conjunctivae and lids are normal. Pupils are equal, round, and reactive to light.  Neck: No JVD present. Carotid bruit is not present. No edema present. No thyroid mass and no thyromegaly present.  Cardiovascular: S1 normal, S2 normal and normal heart sounds.  Exam reveals no gallop.   No murmur heard. Pulses:      Dorsalis pedis pulses are 2+ on the right side, and 2+ on the left side.  Respiratory: No respiratory distress. He has decreased breath sounds in the right lower field and the left lower field. He has no wheezes. He has no rhonchi. He has no rales.  GI: Soft. Bowel sounds are normal. There is no tenderness.   Musculoskeletal:       Right ankle: He exhibits swelling.       Left ankle: He exhibits swelling.  Lymphadenopathy:    He has no cervical adenopathy.  Neurological: He is alert.  Skin: Skin is warm. No rash noted. Nails show no clubbing.  Psychiatric: He has a normal mood and affect.      Data Reviewed: Basic Metabolic Panel:  Recent Labs Lab 10/15/16 1450 10/15/16 2137 10/16/16 0326 10/16/16 2026 10/17/16 0636 10/18/16 0757  NA 138 140 139  --  136 137  K 5.8* 5.7* 5.6* 5.0 5.1 5.2*  CL 106 109 109  --  105 106  CO2 24 22 22   --  25 25  GLUCOSE 119* 127* 148*  --  176* 158*  BUN 47* 47* 46*  --  61* 78*  CREATININE 3.75* 3.75* 3.63*  --  3.33* 2.95*  CALCIUM 8.7* 8.9 8.7*  --  8.7* 8.8*  MG  --  2.0  --   --   --   --   PHOS  --  5.3*  --   --   --   --    CBC:  Recent Labs Lab 10/13/16 2357 10/14/16 0624 10/17/16 0636  WBC 9.3 6.8 10.5  HGB 9.7* 9.4* 9.4*  HCT 29.5* 28.8* 28.2*  MCV 92.2 92.3 93.7  PLT 182 156 171   Cardiac Enzymes:  Recent Labs Lab 10/13/16 2357 10/15/16 1450 10/15/16 2137 10/16/16 0326  TROPONINI 0.03* 0.03* <  0.03 <0.03    CBG:  Recent Labs Lab 10/15/16 1149 10/16/16 0715  GLUCAP 115* 145*    Recent Results (from the past 240 hour(s))  Surgical PCR screen     Status: Abnormal   Collection Time: 10/14/16  5:25 AM  Result Value Ref Range Status   MRSA, PCR NEGATIVE NEGATIVE Final   Staphylococcus aureus POSITIVE (A) NEGATIVE Final    Comment:        The Xpert SA Assay (FDA approved for NASAL specimens in patients over 66 years of age), is one component of a comprehensive surveillance program.  Test performance has been validated by [facility] for patients greater than or equal to 15 year old. It is not intended to diagnose infection nor to guide or monitor treatment.   MRSA PCR Screening     Status: None   Collection Time: 10/15/16  3:20 PM  Result Value Ref Range Status   MRSA by PCR NEGATIVE NEGATIVE  Final    Comment:        The GeneXpert MRSA Assay (FDA approved for NASAL specimens only), is one component of a comprehensive MRSA colonization surveillance program. It is not intended to diagnose MRSA infection nor to guide or monitor treatment for MRSA infections.       Scheduled Meds: . apixaban  2.5 mg Oral BID  . Chlorhexidine Gluconate Cloth  6 each Topical Daily  . diltiazem  120 mg Oral Daily  . donepezil  5 mg Oral QHS  . escitalopram  10 mg Oral Daily  . folic acid  1 mg Oral Daily  . ipratropium-albuterol  3 mL Nebulization Q6H  . mouth rinse  15 mL Mouth Rinse BID  . metoprolol succinate  100 mg Oral Daily  . midodrine  10 mg Oral TID  . mirtazapine  15 mg Oral QHS  . multivitamin with minerals  1 tablet Oral Daily  . mupirocin ointment  1 application Nasal BID  . pantoprazole  40 mg Oral Daily  . protein supplement shake  11 oz Oral BID BM  . sodium polystyrene  30 g Oral Once  . tamsulosin  0.4 mg Oral QPC supper  . thiamine  100 mg Oral Daily   Continuous Infusions: . sodium chloride      Assessment/Plan:  1. Acute respiratory acidosis with high PCO2.  Patient was made a DO NOT RESUSCITATE.  Try BiPAP at night. Order VBG for the morning 2. Acute encephalopathy. Improved but less talkative today 3. Alcohol abuse. Patient on CIWA protocol. Continue thiamine, folic acid and multivitamin. Patient must stop alcohol. Case discussed with social worker about family being concerned about taking away his license. She will talk with the family about this. 4. Acute kidney injury on chronic kidney disease stage IV.  Appreciate nephrology consultation. Remove Foley catheter 5. Hyperkalemia, secondary to acute kidney injury. We'll give a dose of Kayexalate today 6. BPH on Flomax 7. Chronic atrial fibrillation- Eliquis, diltiazem and metoprolol 8. Hip fracture nonsurgical as per orthopedic surgery. Pain control with Tylenol 9. Twitching left lower extremity. This is  better today than the other day.  Code Status:     Code Status Orders        Start     Ordered   10/14/16 0433  Full code  Continuous     10/14/16 0432    Code Status History    Date Active Date Inactive Code Status Order ID Comments User Context   11/24/2015  5:27 PM  11/26/2015  8:13 PM Full Code [id number]  Epifanio Lesches, MD ED   01/08/2015 11:16 PM 01/10/2015  8:35 PM Full Code [id number]  Earnestine Leys, MD Inpatient   01/07/2015  3:06 PM 01/08/2015 11:16 PM Full Code [id number]  Earnestine Leys, MD ED   08/22/2014  8:47 PM 08/25/2014  5:54 PM Full Code [id number]  Dustin Flock, MD ED    Advance Directive Documentation     Most Recent Value  Type of Advance Directive  Living will  Pre-existing out of facility DNR order (yellow form or pink MOST form)  -  "MOST" Form in Place?  -     Family Communication: Spoke with Wife at the bedside Disposition Plan: will need rehabilitation  Consultants:  Critical care specialist  Orthopedic surgery  Time spent: 25 minutes  [place], [place]

## 2016-10-18 NOTE — Progress Notes (Addendum)
Patient is A&O x4. Up with assist x2, pivot transfer to chair. Foley removed. LBM this shift, Weaned down off oxygen. Lethargic in mornings, refuses bipap over night. Good appetite. Tele discontinued this pm. Potassium 5.2. Bed alarm on for safety.

## 2016-10-18 NOTE — Evaluation (Signed)
Physical Therapy Evaluation Patient Details Name: KINNEY SACKMANN MRN: [id number] DOB: June 02, 1933 Today's Date: 10/18/2016   History of Present Illness  83 y/o male admitted on 10/13/16 for a L periprosthetic hip fracture (minimally displaced) s/p a fall, plan for conservative/non-op management at this time (TTWB L LE).  Hospital course significant for respiratory decline/distress requiring transfer to CCU for BiPAP; now returned to floor, weaned to [place] (4L) and cleared to resumption of PT.  PMH includes L THA with revision, AAA, nasal cancer, chronic a-fib, CAD, COPD, CKD stage III, diastolic CHF, PVD, HTN, HLD, tachy-brady syndrome, depression and pacemaker.   Clinical Impression  Patient alert and oriented to basic information; follows simple commands and demonstrates good effort with tasks throughout session.  Fatigues quickly and requires intermittent rest breaks with all activities.  Constant cuing/assist for L LE TTWBing.  Currently requiring mod assist for sit/stand and single step forward/backward with RW.  Poor ability to fully clear R LE during efforts; poor balance dynamically.  Additional gait efforts deferred as result.   Educated in and completed bed/chair transfer via scoot pivot over level surfaces, min assist; improved indep and adherence to [employer] restrictions. Moderate SOB with exertion; sats >90% on 4L throughout session. Would benefit from skilled PT to address above deficits and promote optimal return to PLOF; recommend transition to STR upon discharge from acute hospitalization.     Follow Up Recommendations SNF    Equipment Recommendations       Recommendations for Other Services       Precautions / Restrictions Precautions Precautions: Fall Precaution Comments: CIWA Restrictions Weight Bearing Restrictions: Yes LLE Weight Bearing: Touchdown weight bearing      Mobility  Bed Mobility Overal bed mobility: Needs Assistance Bed Mobility: Supine to Sit     Supine  to sit: Min assist     General bed mobility comments: cuing for hand placement, heavy use of bedrails; min assist for L LE management  Transfers Overall transfer level: Needs assistance Equipment used: Rolling walker (2 wheeled) Transfers: Sit to/from Stand Sit to Stand: Mod assist         General transfer comment: cuing for hand placement; dep assist and cuing for L LE WBing  Ambulation/Gait Ambulation/Gait assistance: Mod assist Ambulation Distance (Feet): 1 Feet Assistive device: Rolling walker (2 wheeled)       General Gait Details: single hop forward/backward with R LE; constant cuing/assist for L LE TTWB.  Poor standing balance; poor ability to fully clear R LE and clear in modified SLS  Stairs            Wheelchair Mobility    Modified Rankin (Stroke Patients Only)       Balance Overall balance assessment: Needs assistance Sitting-balance support: No upper extremity supported;Feet supported Sitting balance-Leahy Scale: Good     Standing balance support: Bilateral upper extremity supported Standing balance-Leahy Scale: Fair                               Pertinent Vitals/Pain Pain Assessment: 0-10 Pain Score: 7  Pain Location: L hip Pain Descriptors / Indicators: Guarding;Grimacing;Discomfort Pain Intervention(s): Limited activity within patient's tolerance;Monitored during session;Repositioned    Home Living Family/patient expects to be discharged to:: Private residence Living Arrangements: Spouse/significant other Available Help at Discharge: Family;Available 24 hours/day Type of Home: House Home Access: Ramped entrance     Home Layout: One level Home Equipment: Walker - 2 wheels;Cane - single  point;Bedside commode;Tub bench Additional Comments: Pt's wife able to care for him 24/7    Prior Function Level of Independence: Independent with assistive device(s)         Comments: Ambulated with SPC within his home. Pt states he  does not ambulate outside his home often.      Hand Dominance        Extremity/Trunk Assessment   Upper Extremity Assessment Upper Extremity Assessment: Overall WFL for tasks assessed (grossly 4-/5 throughout, chronic L shoulder RTC irritation/injury reported)    Lower Extremity Assessment Lower Extremity Assessment: Generalized weakness (R LE grossly 4/5, L LE hip 3-/5 (limited by pain), knee and ankle 4/5.  Baseline paresthesia bilat ankles distally)       Communication   Communication: No difficulties  Cognition Arousal/Alertness: Awake/alert Behavior During Therapy: WFL for tasks assessed/performed Overall Cognitive Status: Within Functional Limits for tasks assessed                                        General Comments      Exercises Other Exercises Other Exercises: Sit/stand x4 with RW, mod assist-awareness of and adherence to TTWB improved with repetition; fatigues quickly and requires frequent rest periods Other Exercises: Lateral tranfser, bed/chair over  level surfaces, min assist-constant cuing for technique, L LE WBing.  Fair/good UB strength required to complete.   Assessment/Plan    PT Assessment Patient needs continued PT services  PT Problem List Decreased strength;Decreased activity tolerance;Decreased mobility;Pain;Decreased range of motion;Decreased balance;Decreased coordination;Decreased knowledge of use of DME;Decreased safety awareness;Decreased knowledge of precautions;Cardiopulmonary status limiting activity       PT Treatment Interventions Gait training;Stair training;Therapeutic activities;Therapeutic exercise;Patient/family education;Functional mobility training;DME instruction;Balance training    PT Goals (Current goals can be found in the Care Plan section)  Acute Rehab PT Goals Patient Stated Goal: to get stronger PT Goal Formulation: With patient Time For Goal Achievement: 11/01/16 Potential to Achieve Goals: Good     Frequency 7X/week   Barriers to discharge Decreased caregiver support      Co-evaluation               AM-PAC PT "6 Clicks" Daily Activity  Outcome Measure Difficulty turning over in bed (including adjusting bedclothes, sheets and blankets)?: Total Difficulty moving from lying on back to sitting on the side of the bed? : Total Difficulty sitting down on and standing up from a chair with arms (e.g., wheelchair, bedside commode, etc,.)?: Total Help needed moving to and from a bed to chair (including a wheelchair)?: A Lot Help needed walking in hospital room?: A Lot Help needed climbing 3-5 steps with a railing? : Total 6 Click Score: 8    End of Session Equipment Utilized During Treatment: Gait belt;Oxygen Activity Tolerance: Patient tolerated treatment well Patient left: in chair;with call bell/phone within reach;with chair alarm set;with family/visitor present Nurse Communication: Mobility status PT Visit Diagnosis: Other abnormalities of gait and mobility (R26.89);Muscle weakness (generalized) (M62.81);Pain Pain - Right/Left: Left Pain - part of body: Hip    Time: 3846-6599 PT Time Calculation (min) (ACUTE ONLY): 30 min   Charges:   PT Evaluation $PT Re-evaluation: 1 Procedure PT Treatments $Therapeutic Activity: 8-22 mins   PT G Codes:        Teren Zurcher H. Owens Shark, PT, DPT, NCS 10/18/16, 1:51 PM [phone]

## 2016-10-19 DIAGNOSIS — Z4789 Encounter for other orthopedic aftercare: Secondary | ICD-10-CM | POA: Diagnosis not present

## 2016-10-19 DIAGNOSIS — F1010 Alcohol abuse, uncomplicated: Secondary | ICD-10-CM | POA: Diagnosis not present

## 2016-10-19 DIAGNOSIS — M6281 Muscle weakness (generalized): Secondary | ICD-10-CM | POA: Diagnosis not present

## 2016-10-19 DIAGNOSIS — J9601 Acute respiratory failure with hypoxia: Secondary | ICD-10-CM | POA: Diagnosis not present

## 2016-10-19 DIAGNOSIS — R488 Other symbolic dysfunctions: Secondary | ICD-10-CM | POA: Diagnosis not present

## 2016-10-19 DIAGNOSIS — S72012D Unspecified intracapsular fracture of left femur, subsequent encounter for closed fracture with routine healing: Secondary | ICD-10-CM | POA: Diagnosis not present

## 2016-10-19 DIAGNOSIS — N189 Chronic kidney disease, unspecified: Secondary | ICD-10-CM | POA: Diagnosis not present

## 2016-10-19 DIAGNOSIS — R4182 Altered mental status, unspecified: Secondary | ICD-10-CM | POA: Diagnosis not present

## 2016-10-19 DIAGNOSIS — I5032 Chronic diastolic (congestive) heart failure: Secondary | ICD-10-CM | POA: Diagnosis not present

## 2016-10-19 DIAGNOSIS — R278 Other lack of coordination: Secondary | ICD-10-CM | POA: Diagnosis not present

## 2016-10-19 DIAGNOSIS — J449 Chronic obstructive pulmonary disease, unspecified: Secondary | ICD-10-CM | POA: Diagnosis not present

## 2016-10-19 DIAGNOSIS — S72009A Fracture of unspecified part of neck of unspecified femur, initial encounter for closed fracture: Secondary | ICD-10-CM | POA: Diagnosis not present

## 2016-10-19 DIAGNOSIS — G9340 Encephalopathy, unspecified: Secondary | ICD-10-CM | POA: Diagnosis not present

## 2016-10-19 DIAGNOSIS — Z7401 Bed confinement status: Secondary | ICD-10-CM | POA: Diagnosis not present

## 2016-10-19 DIAGNOSIS — E872 Acidosis: Secondary | ICD-10-CM | POA: Diagnosis not present

## 2016-10-19 DIAGNOSIS — S82842A Displaced bimalleolar fracture of left lower leg, initial encounter for closed fracture: Secondary | ICD-10-CM | POA: Diagnosis not present

## 2016-10-19 DIAGNOSIS — I4891 Unspecified atrial fibrillation: Secondary | ICD-10-CM | POA: Diagnosis not present

## 2016-10-19 DIAGNOSIS — R2681 Unsteadiness on feet: Secondary | ICD-10-CM | POA: Diagnosis not present

## 2016-10-19 DIAGNOSIS — S72012S Unspecified intracapsular fracture of left femur, sequela: Secondary | ICD-10-CM | POA: Diagnosis not present

## 2016-10-19 DIAGNOSIS — Z9181 History of falling: Secondary | ICD-10-CM | POA: Diagnosis not present

## 2016-10-19 DIAGNOSIS — M25552 Pain in left hip: Secondary | ICD-10-CM | POA: Diagnosis not present

## 2016-10-19 DIAGNOSIS — R2689 Other abnormalities of gait and mobility: Secondary | ICD-10-CM | POA: Diagnosis not present

## 2016-10-19 DIAGNOSIS — I509 Heart failure, unspecified: Secondary | ICD-10-CM | POA: Diagnosis not present

## 2016-10-19 DIAGNOSIS — I495 Sick sinus syndrome: Secondary | ICD-10-CM | POA: Diagnosis not present

## 2016-10-19 LAB — CK: CK TOTAL: 32 U/L — AB (ref 49–397)

## 2016-10-19 LAB — BLOOD GAS, ARTERIAL
ACID-BASE EXCESS: 0.6 mmol/L (ref 0.0–2.0)
Bicarbonate: 27.6 mmol/L (ref 20.0–28.0)
FIO2: 0.24
O2 SAT: 90 %
PATIENT TEMPERATURE: 37
pCO2 arterial: 56 mmHg — ABNORMAL HIGH (ref 32.0–48.0)
pH, Arterial: 7.3 — ABNORMAL LOW (ref 7.350–7.450)
pO2, Arterial: 65 mmHg — ABNORMAL LOW (ref 83.0–108.0)

## 2016-10-19 LAB — BASIC METABOLIC PANEL
Anion gap: 7 (ref 5–15)
BUN: 77 mg/dL — AB (ref 6–20)
CHLORIDE: 108 mmol/L (ref 101–111)
CO2: 25 mmol/L (ref 22–32)
Calcium: 8.7 mg/dL — ABNORMAL LOW (ref 8.9–10.3)
Creatinine, Ser: 2.97 mg/dL — ABNORMAL HIGH (ref 0.61–1.24)
GFR calc Af Amer: 21 mL/min — ABNORMAL LOW (ref >60)
GFR, EST NON AFRICAN AMERICAN: 18 mL/min — AB (ref >60)
GLUCOSE: 101 mg/dL — AB (ref 65–99)
POTASSIUM: 4.5 mmol/L (ref 3.5–5.1)
Sodium: 140 mmol/L (ref 135–145)

## 2016-10-19 MED ORDER — ADULT MULTIVITAMIN W/MINERALS CH: 1.0000 | ORAL_TABLET | Freq: Every day | ORAL | 0 refills | Status: AC

## 2016-10-19 MED ORDER — FOLIC ACID 1 MG PO TABS: 1.0000 mg | ORAL_TABLET | Freq: Every day | ORAL | 0 refills | Status: AC

## 2016-10-19 MED ORDER — TORSEMIDE 20 MG PO TABS: 20.0000 mg | ORAL_TABLET | Freq: Every day | ORAL | 6 refills | Status: AC | PRN

## 2016-10-19 MED ORDER — SENNOSIDES-DOCUSATE SODIUM 8.6-50 MG PO TABS: 1.0000 | ORAL_TABLET | Freq: Every evening | ORAL | 0 refills | Status: AC | PRN

## 2016-10-19 MED ORDER — HYDROCODONE-ACETAMINOPHEN 5-325 MG PO TABS: 1.0000 | ORAL_TABLET | Freq: Four times a day (QID) | ORAL | 0 refills | Status: AC | PRN

## 2016-10-19 MED ORDER — PREMIER PROTEIN SHAKE: 11.0000 [oz_av] | Freq: Two times a day (BID) | ORAL | 0 refills | Status: AC

## 2016-10-19 MED ORDER — MIRTAZAPINE 15 MG PO TABS: 15.0000 mg | ORAL_TABLET | Freq: Every day | ORAL | 0 refills | Status: AC

## 2016-10-19 MED ORDER — ESCITALOPRAM OXALATE 10 MG PO TABS: 10.0000 mg | ORAL_TABLET | Freq: Every day | ORAL | 0 refills | Status: AC

## 2016-10-19 MED ORDER — IPRATROPIUM-ALBUTEROL 0.5-2.5 (3) MG/3ML IN SOLN: 3.0000 mL | Freq: Four times a day (QID) | RESPIRATORY_TRACT | 0 refills | Status: AC

## 2016-10-19 MED ORDER — THIAMINE HCL 100 MG PO TABS: 100.0000 mg | ORAL_TABLET | Freq: Every day | ORAL | 0 refills | Status: AC

## 2016-10-19 NOTE — Clinical Social Work Placement (Signed)
   CLINICAL SOCIAL WORK PLACEMENT  NOTE  Date:  10/19/2016  Patient Details  Name: [name] MRN: [id number] Date of Birth: [birth year]  Clinical Social Work is seeking post-discharge placement for this patient at the [place] level of care (*CSW will initial, date and re-position this form in  chart as items are completed):  Yes   Patient/family provided with [facility] Work Department's list of facilities offering this level of care within the geographic area requested by the patient (or if unable, by the patient's family).  Yes   Patient/family informed of their freedom to choose among providers that offer the needed level of care, that participate in Medicare, Medicaid or managed care program needed by the patient, have an available bed and are willing to accept the patient.  Yes   Patient/family informed of [employer]'s ownership interest in [facility] and [facility], as well as of the fact that they are under no obligation to receive care at these facilities.  PASRR submitted to EDS on       PASRR number received on       Existing PASRR number confirmed on 10/15/16     FL2 transmitted to all facilities in geographic area requested by pt/family on 10/15/16     FL2 transmitted to all facilities within larger geographic area on       Patient informed that his/her managed care company has contracts with or will negotiate with certain facilities, including the following:        Yes   Patient/family informed of bed offers received.  Patient chooses bed at  [facility] )     Physician recommends and patient chooses bed at      Patient to be transferred to  [facility] ) on 10/19/16.  Patient to be transferred to facility by  [facility] )     Patient family notified on 10/19/16 of transfer.  Name of family member notified:   (Patient's wife [name] and daughter Bethena Roys are aware of D/C today. )     PHYSICIAN        Additional Comment:    _______________________________________________ Chereese Cilento, Veronia Beets, LCSW 10/19/2016, 1:43 PM

## 2016-10-19 NOTE — Progress Notes (Signed)
Patient is medically stable for D/C to [facility] today. Per [facility] admissions coordinator at [facility] patient can come today to room 703. Per Luretha Rued authorization has been received. RN will call report and arrange EMS for transport. Clinical Education officer, museum (CSW) sent D/C orders to [facility] via [place]. Patient is aware of above. Patient's wife [name] and daughter Bethena Roys are at bedside and aware of above. Please reconsult if future social work needs arise. CSW signing off.   [employer], LCSW [phone]

## 2016-10-19 NOTE — Progress Notes (Signed)
Patient is being discharged to [facility]. IV removed with cath intact. Report called. Patient packed and set, waiting for EMS at this time.

## 2016-10-19 NOTE — Discharge Instructions (Addendum)
Weight bearing as tolerated   Information on my medicine - ELIQUIS (apixaban)  Why was Eliquis prescribed for you? Eliquis was prescribed for you to reduce the risk of a blood clot forming that can cause a stroke if you have a medical condition called atrial fibrillation (a type of irregular heartbeat).  What do You need to know about Eliquis ? Take your Eliquis TWICE DAILY - one tablet in the morning and one tablet in the evening with or without food. If you have difficulty swallowing the tablet whole please discuss with your pharmacist how to take the medication safely.  Take Eliquis exactly as prescribed by your doctor and DO NOT stop taking Eliquis without talking to the doctor who prescribed the medication.  Stopping may increase your risk of developing a stroke.  Refill your prescription before you run out.  After discharge, you should have regular check-up appointments with your healthcare provider that is prescribing your Eliquis.  In the future your dose may need to be changed if your kidney function or weight changes by a significant amount or as you get older.  What do you do if you miss a dose? If you miss a dose, take it as soon as you remember on the same day and resume taking twice daily.  Do not take more than one dose of ELIQUIS at the same time to make up a missed dose.  Important Safety Information A possible side effect of Eliquis is bleeding. You should call your healthcare provider right away if you experience any of the following: ? Bleeding from an injury or your nose that does not stop. ? Unusual colored urine (red or dark brown) or unusual colored stools (red or black). ? Unusual bruising for unknown reasons. ? A serious fall or if you hit your head (even if there is no bleeding).  Some medicines may interact with Eliquis and might increase your risk of bleeding or clotting while on Eliquis. To help avoid this, consult your healthcare provider or  pharmacist prior to using any new prescription or non-prescription medications, including herbals, vitamins, non-steroidal anti-inflammatory drugs (NSAIDs) and supplements.  This website has more information on Eliquis (apixaban): http://www.eliquis.com/eliquis/home

## 2016-10-19 NOTE — Progress Notes (Signed)
Nutrition Follow-up  DOCUMENTATION CODES:   Not applicable  INTERVENTION:   -D/c Premier Protein, due to poor acceptance -Continue MVI daily  NUTRITION DIAGNOSIS:   Increased nutrient needs related to  (hip fracture and etoh abuse) as evidenced by estimated needs.  Ongoing  GOAL:   Patient will meet greater than or equal to 90% of their needs  Met  MONITOR:   PO intake, Supplement acceptance, Labs, Weight trends  REASON FOR ASSESSMENT:   Malnutrition Screening Tool    ASSESSMENT:   83 y.o. male with a known history of Abdominal aortic aneurysm , nasal cancer, chronic atrial fibrillation on anticoagulation with liquids, coronary artery disease, COPD, chronic kidney disease stage III, diastolic heart failure, peripheral vascular disease presented to the emergency room with left hip pain. Patient drank alcohol and lost balance and fell down at home. It happened in his kitchen and he was lying on the kitchen floor. When he fell down he landed on the left side of the hip. He has aching pain in the left hip which is 7 out of 10 on a scale of 1-10. Patient had hip surgery on the left side and has a prosthesis in the past. Imaging studies today revealed fracture of the prosthesis.  Pt required ICU transfer from 10/15/16 to 10/17/16 related to respiratory distress. Pt sleepy, but able to participate in conversation. Per pt wife, pt has not been breathing or sleeping well secondary to refusing CPAP at night.   Pt reports very good and improved appetite. Noted meal completion 100%. Observed breakfast tray; pt consumed 75% of eggs and pancake breakfast. He has been refusing Premier Protein supplements.   Discussed with pt and wife importance of continued good meal intake for healing, as well as progression with therapy while at SNF.   Per CSW note, plan to d/c to SNF soon.   Labs reviewed.   Diet Order:  Diet Heart Room service appropriate? Yes; Fluid consistency: Thin  Skin:   Reviewed, no issues  Last BM:  10/18/16  Height:   Ht Readings from Last 1 Encounters:  10/13/16 6' 1"  (1.854 m)    Weight:   Wt Readings from Last 1 Encounters:  10/15/16 222 lb 0.1 oz (100.7 kg)    Ideal Body Weight:  83.6 kg  BMI:  Body mass index is 29.29 kg/m.  Estimated Nutritional Needs:   Kcal:  1800-2000  Protein:  90-105 grams  Fluid:  1.8-2.0 L  EDUCATION NEEDS:   No education needs identified at this time  Garhett Bernhard A. Jimmye Norman, RD, LDN, CDE Pager: [phone] After hours Pager: [phone]

## 2016-10-19 NOTE — Discharge Summary (Signed)
[facility] NAME: [name]    MR#:  [id number]  DATE OF BIRTH:  1934/02/10  DATE OF ADMISSION:  10/13/2016 ADMITTING PHYSICIAN: Saundra Shelling, MD  DATE OF DISCHARGE: 10/20/2106  PRIMARY CARE PHYSICIAN: Maryland Pink, MD    ADMISSION DIAGNOSIS:  Fall [W19.XXXA] Left hip pain [M25.552] Closed fracture of left hip, initial encounter ([place]) [S72.002A] Anemia, unspecified type [D64.9]  DISCHARGE DIAGNOSIS:  Principal Problem:   Moderate recurrent major depression ([place]) Active Problems:   Alcohol abuse   Hip fracture ([place])   SECONDARY DIAGNOSIS:   Past Medical History:  Diagnosis Date  . AAA (abdominal aortic aneurysm) ([place])    a. s/p repair - 1999 @ [facility] @ time of CABG.  . Broken ankle   . Cancer ([place])    skin cancer nose  . Chronic atrial fibrillation (HCC)    a. on pradaxa  . Chronic diastolic CHF (congestive heart failure) ([place])    a. 01/2012 Echo:  EF 55%.  . Chronic obstructive pulmonary disease ([place])   . CKD (chronic kidney disease), stage III    a. Acute on chronic 03/2012  . Coronary artery disease    a. s/p MI x 3;  b. s/p CABG x2 in 1999;  c. Cath 2007: LIMA->LAD patent, VG->OM 100, PCI/DES of RPL/mid RCA (cypher DES).  . Depression   . GERD (gastroesophageal reflux disease)   . History of suicidal ideation   . Hyperlipidemia   . Hypertension   . PVD (peripheral vascular disease) ([place])   . Sleep apnea   . Tachy-brady syndrome ([place])    a. s/p PPM 2010.    HOSPITAL COURSE:   1. Hip fracture left side nonsurgical as per orthopedic surgery. Pain control with Tylenol first then Norco if needed. Weightbearing as tolerated with physical therapy 2. Acute respiratory acidosis with high PCO2. The patient had acute encephalopathy and a blood gas was drawn and he was very acidotic and he was transferred to the ICU for BiPAP. The patient improves in the next day his mental status was better and continued to stay  well during the hospital course. The patient refused any BiPAP treatments. His acidosis improved. 3. Acute encephalopathy. This has improved from when he was unresponsive. 4. Alcohol abuse. The patient did not go through withdrawal while here. Continue thiamine and folic acid and multivitamin. I advised the patient must stop all alcohol. I also advised that he stop driving. Social worker spoke with family about driving him to the places that he needs to go. 5. Acute kidney injury on chronic kidney disease stage IV. Follow-up with nephrology as outpatient. The patient is urinating on his own and Foley catheter was removed. Creatinine peaked at 4.34 and continued to improve down to 2.97. This may end up being his new baseline. I held his torsemide while here in the hospital. Can use when necessary for weight gain as outpatient. 6. BPH on Flomax 7. Chronic atrial fibrillation on Eliquis diltiazem and metoprolol 8. Patient had some twitching of his left lower extremity unclear whether this was for him the acidosis or not. At this point not having it. 9. Depression psychiatry started him on a medication for this.   DISCHARGE CONDITIONS:   Fair  CONSULTS OBTAINED:  Treatment Team:  Dereck Leep, MD Clapacs, Madie Reno, MD Hermelinda Dellen, DO Lavonia Dana, MD  DRUG ALLERGIES:   Allergies  Allergen Reactions  . Lipitor [Atorvastatin] Other (See Comments)  Arm pain    DISCHARGE MEDICATIONS:   Current Discharge Medication List    START taking these medications   Details  escitalopram (LEXAPRO) 10 MG tablet Take 1 tablet (10 mg total) by mouth daily. Qty: 30 tablet, Refills: 0    folic acid (FOLVITE) 1 MG tablet Take 1 tablet (1 mg total) by mouth daily. Qty: 30 tablet, Refills: 0    HYDROcodone-acetaminophen (NORCO/VICODIN) 5-325 MG tablet Take 1 tablet by mouth every 6 (six) hours as needed for moderate pain. Qty: 12 tablet, Refills: 0    ipratropium-albuterol (DUONEB) 0.5-2.5  (3) MG/3ML SOLN Take 3 mLs by nebulization every 6 (six) hours. Qty: 360 mL, Refills: 0    mirtazapine (REMERON) 15 MG tablet Take 1 tablet (15 mg total) by mouth at bedtime. Qty: 30 tablet, Refills: 0    Multiple Vitamin (MULTIVITAMIN WITH MINERALS) TABS tablet Take 1 tablet by mouth daily. Qty: 30 tablet, Refills: 0    protein supplement shake (PREMIER PROTEIN) LIQD Take 325 mLs (11 oz total) by mouth 2 (two) times daily between meals. Qty: 60 Can, Refills: 0    senna-docusate (SENOKOT-S) 8.6-50 MG tablet Take 1 tablet by mouth at bedtime as needed for mild constipation. Qty: 30 tablet, Refills: 0    thiamine 100 MG tablet Take 1 tablet (100 mg total) by mouth daily. Qty: 30 tablet, Refills: 0      CONTINUE these medications which have CHANGED   Details  torsemide (DEMADEX) 20 MG tablet Take 1 tablet (20 mg total) by mouth daily as needed (for weight gain 3 lbs one day or 5 lbs one week). Qty: 30 tablet, Refills: 6      CONTINUE these medications which have NOT CHANGED   Details  acetaminophen (TYLENOL) 325 MG tablet Take 650 mg by mouth every 6 (six) hours as needed.    diltiazem (CARDIZEM CD) 120 MG 24 hr capsule Take 1 capsule (120 mg total) by mouth daily. Qty: 30 capsule, Refills: 3   Associated Diagnoses: Chest pain, unspecified chest pain type; Cardiac pacemaker in situ; Chronic atrial fibrillation (HCC)    donepezil (ARICEPT) 5 MG tablet Take 5 mg by mouth at bedtime.    ELIQUIS 2.5 MG TABS tablet TAKE ONE TABLET BY MOUTH TWICE DAILY Qty: 60 tablet, Refills: 3    metoprolol succinate (TOPROL-XL) 100 MG 24 hr tablet Take 1 tablet (100 mg total) by mouth daily. Qty: 30 tablet, Refills: 3   Associated Diagnoses: Unspecified essential hypertension    nitroGLYCERIN (NITROSTAT) 0.4 MG SL tablet Place 1 tablet (0.4 mg total) under the tongue every 5 (five) minutes as needed for chest pain. Qty: 25 tablet, Refills: 3    omeprazole (PRILOSEC) 20 MG capsule Take 20 mg by  mouth daily with lunch.     Tamsulosin HCl (FLOMAX) 0.4 MG CAPS Take 0.4 mg by mouth daily after supper.      STOP taking these medications     simvastatin (ZOCOR) 20 MG tablet      guaiFENesin (MUCINEX) 600 MG 12 hr tablet      midodrine (PROAMATINE) 10 MG tablet      traMADol (ULTRAM) 50 MG tablet          DISCHARGE INSTRUCTIONS:   Follow-up with Dr. at rehabilitation one day Follow-up with nephrologist as outpatient  If you experience worsening of your admission symptoms, develop shortness of breath, life threatening emergency, suicidal or homicidal thoughts you must seek medical attention immediately by calling 911 or calling your MD  immediately  if symptoms less severe.  You Must read complete instructions/literature along with all the possible adverse reactions/side effects for all the Medicines you take and that have been prescribed to you. Take any new Medicines after you have completely understood and accept all the possible adverse reactions/side effects.   Please note  You were cared for by a hospitalist during your hospital stay. If you have any questions about your discharge medications or the care you received while you were in the hospital after you are discharged, you can call the unit and asked to speak with the hospitalist on call if the hospitalist that took care of you is not available. Once you are discharged, your primary care physician will handle any further medical issues. Please note that NO REFILLS for any discharge medications will be authorized once you are discharged, as it is imperative that you return to your primary care physician (or establish a relationship with a primary care physician if you do not have one) for your aftercare needs so that they can reassess your need for medications and monitor your lab values.    Today   CHIEF COMPLAINT:   Chief Complaint  Patient presents with  . Hip Pain  . Leg Pain    HISTORY OF PRESENT ILLNESS:   Adriell Polansky  is a 83 y.o. male presented with hip and leg pain and found to have a hip fracture   VITAL SIGNS:  Blood pressure 127/69, pulse 61, temperature 98.4 F (36.9 C), resp. rate 18, height 6\' 1"  (1.854 m), weight 100.7 kg (222 lb 0.1 oz), SpO2 93 %.    PHYSICAL EXAMINATION:  GENERAL:  83 y.o.-year-old patient lying in the bed with no acute distress.  EYES: Pupils equal, round, reactive to light and accommodation. No scleral icterus. Extraocular muscles intact.  HEENT: Head atraumatic, normocephalic. Oropharynx and nasopharynx clear.  NECK:  Supple, no jugular venous distention. No thyroid enlargement, no tenderness.  LUNGS: Normal breath sounds bilaterally, no wheezing, rales,rhonchi or crepitation. No use of accessory muscles of respiration.  CARDIOVASCULAR: S1, S2 normal. No murmurs, rubs, or gallops.  ABDOMEN: Soft, non-tender, non-distended. Bowel sounds present. No organomegaly or mass.  EXTREMITIES: 2+ edema, no cyanosis, or clubbing.  NEUROLOGIC: Cranial nerves II through XII are intact. Muscle strength 5/5 in all extremities. Sensation intact. Gait not checked.  PSYCHIATRIC: The patient is alert and oriented x 3.  SKIN: No obvious rash, lesion, or ulcer.   DATA REVIEW:   CBC  Recent Labs Lab 10/17/16 0636  WBC 10.5  HGB 9.4*  HCT 28.2*  PLT 171    Chemistries   Recent Labs Lab 10/15/16 2137  10/19/16 0446  NA 140  < > 140  K 5.7*  < > 4.5  CL 109  < > 108  CO2 22  < > 25  GLUCOSE 127*  < > 101*  BUN 47*  < > 77*  CREATININE 3.75*  < > 2.97*  CALCIUM 8.9  < > 8.7*  MG 2.0  --   --   < > = values in this interval not displayed.  Cardiac Enzymes  Recent Labs Lab 10/16/16 0326  TROPONINI <0.03    Microbiology Results  Results for orders placed or performed during the hospital encounter of 10/13/16  Surgical PCR screen     Status: Abnormal   Collection Time: 10/14/16  5:25 AM  Result Value Ref Range Status   MRSA, PCR NEGATIVE NEGATIVE  Final   Staphylococcus aureus  POSITIVE (A) NEGATIVE Final    Comment:        The Xpert SA Assay (FDA approved for NASAL specimens in patients over 86 years of age), is one component of a comprehensive surveillance program.  Test performance has been validated by [facility] for patients greater than or equal to 67 year old. It is not intended to diagnose infection nor to guide or monitor treatment.   MRSA PCR Screening     Status: None   Collection Time: 10/15/16  3:20 PM  Result Value Ref Range Status   MRSA by PCR NEGATIVE NEGATIVE Final    Comment:        The GeneXpert MRSA Assay (FDA approved for NASAL specimens only), is one component of a comprehensive MRSA colonization surveillance program. It is not intended to diagnose MRSA infection nor to guide or monitor treatment for MRSA infections.        Management plans discussed with the patient, family (yesterday) and they are in agreement.  CODE STATUS:     Code Status Orders        Start     Ordered   10/16/16 0954  Do not attempt resuscitation (DNR)  Continuous    Question Answer Comment  In the event of cardiac or respiratory ARREST Do not call a "code blue"   In the event of cardiac or respiratory ARREST Do not perform Intubation, CPR, defibrillation or ACLS   In the event of cardiac or respiratory ARREST Use medication by any route, position, wound care, and other measures to relive pain and suffering. May use oxygen, suction and manual treatment of airway obstruction as needed for comfort.      10/16/16 0953    Code Status History    Date Active Date Inactive Code Status Order ID Comments User Context   10/14/2016  4:32 AM 10/16/2016  9:53 AM Full Code [id number]  Saundra Shelling, MD ED   11/24/2015  5:27 PM 11/26/2015  8:13 PM Full Code [id number]  Epifanio Lesches, MD ED   01/08/2015 11:16 PM 01/10/2015  8:35 PM Full Code [id number]  Earnestine Leys, MD Inpatient   01/07/2015  3:06 PM 01/08/2015 11:16 PM Full  Code [id number]  Earnestine Leys, MD ED   08/22/2014  8:47 PM 08/25/2014  5:54 PM Full Code [id number]  Dustin Flock, MD ED    Advance Directive Documentation     Most Recent Value  Type of Advance Directive  Living will  Pre-existing out of facility DNR order (yellow form or pink MOST form)  -  "MOST" Form in Place?  -      TOTAL TIME TAKING CARE OF THIS PATIENT: 35 minutes.    Loletha Grayer M.D on 10/19/2016 at 12:05 PM  Between 7am to 6pm - Pager - [phone]  After 6pm go to [email or web address] - password [email or web address] [place]  [phone]  CC: Primary care physician; Maryland Pink, MD

## 2016-10-19 NOTE — Evaluation (Signed)
Occupational Therapy Evaluation Patient Details Name: [name] MRN: [id number] DOB: 1933-08-02 Today's Date: 10/19/2016    History of Present Illness 83 y/o male admitted on 10/13/16 for a L periprosthetic hip fracture (minimally displaced) s/p a fall, plan for conservative/non-op management at this time (TTWB L LE).  Hospital course significant for respiratory decline/distress requiring transfer to CCU for BiPAP; now returned to floor, weaned to  (4L) and cleared to resumption of PT.  PMH includes L THA with revision, AAA, nasal cancer, chronic a-fib, CAD, COPD, CKD stage III, diastolic CHF, PVD, HTN, HLD, tachy-brady syndrome, depression and pacemaker.    Clinical Impression   Pt seen for OT Evaluation this date. Pt presents with decreased strength, ROM, activity tolerance, pain with movement in L hip, increased falls risk and increased need for assist for functional mobility and self care tasks due to noted impairments. Pt will benefit from skilled OT services to address noted impairments and functional deficits, including education/training in AE/DME, self care skills, functional mobility training, and falls prevention. Pt educated in energy conservation strategies and falls prevention as related to orthostatic hypotension, since pt endorsed previous BP testing indicated OH. Pt would benefit from STR following hospitalization to continue addressing goals prior to return home.    Follow Up Recommendations  SNF    Equipment Recommendations  None recommended by OT    Recommendations for Other Services       Precautions / Restrictions Precautions Precautions: Fall Precaution Comments: CIWA Restrictions Weight Bearing Restrictions: Yes LLE Weight Bearing: Touchdown weight bearing      Mobility Bed Mobility      General bed mobility comments: deferred d/t pt up in recliner for session  Transfers Overall transfer level: Needs assistance Equipment used: None Transfers:  Lateral/Scoot Transfers          Lateral/Scoot Transfers: Min assist General transfer comment: Pt demonstrates good use of upper body to scoot towards the R; cues for touchdown use of LLE only. Mostly compliant    Balance Overall balance assessment: Needs assistance Sitting-balance support: No upper extremity supported;Feet supported Sitting balance-Leahy Scale: Good                                     ADL either performed or assessed with clinical judgement   ADL Overall ADL's : Needs assistance/impaired Eating/Feeding: Sitting;Set up   Grooming: Sitting;Set up   Upper Body Bathing: Sitting;Set up;Min guard   Lower Body Bathing: Sitting/lateral leans;Moderate assistance   Upper Body Dressing : Sitting;Set up;Min guard   Lower Body Dressing: Sitting/lateral leans;Moderate assistance                 General ADL Comments: pt generally mod assist for LB ADL, cueing for LLE TTWB precautions     Vision Baseline Vision/History: Wears glasses Wears Glasses: At all times Patient Visual Report: No change from baseline Vision Assessment?: No apparent visual deficits     Perception     Praxis      Pertinent Vitals/Pain Pain Assessment: 0-10 Pain Score: 5  Faces Pain Scale: Hurts even more Pain Location: L hip (with movement), 0/10 at rest Pain Descriptors / Indicators: Sore;Grimacing;Guarding Pain Intervention(s): Limited activity within patient's tolerance;Monitored during session     Hand Dominance Right   Extremity/Trunk Assessment Upper Extremity Assessment Upper Extremity Assessment: Overall WFL for tasks assessed (grossly 4-/5, chronic L RTC injury reported)   Lower Extremity Assessment  Lower Extremity Assessment: Defer to PT evaluation;Generalized weakness   Cervical / Trunk Assessment Cervical / Trunk Assessment: Normal   Communication Communication Communication: No difficulties   Cognition Arousal/Alertness: Awake/alert Behavior  During Therapy: Flat affect;WFL for tasks assessed/performed Overall Cognitive Status: Within Functional Limits for tasks assessed                                     General Comments       Exercises Other Exercises Other Exercises: pt educated in energy conservation strategies with handout provided as well as home/routines modifications to support safety, functional independence with ADL, and falls prevention Other Exercises: pt educated in orthostatic hypotension after pt endorses previous orthostatic vitals indicated OH, education in body positioning and modifications to self care tasks to minimize risk of OH and falls   Shoulder Instructions      Home Living Family/patient expects to be discharged to:: Private residence Living Arrangements: Spouse/significant other Available Help at Discharge: Family;Available 24 hours/day Type of Home: House Home Access: Ramped entrance     Home Layout: One level     Bathroom Shower/Tub: Walk-in shower;Tub/shower unit   Bathroom Toilet: Standard     Home Equipment: Environmental consultant - 2 wheels;Cane - single point;Bedside commode;Tub bench;Adaptive equipment Adaptive Equipment: Reacher;Sock aid Additional Comments: Pt's wife able to care for him 24/7      Prior Functioning/Environment Level of Independence: Independent with assistive device(s)        Comments: Ambulated with SPC within his home. Pt states he does not ambulate outside his home often. Endorses 4-5 falls in past 12 months (likely related to [facility])        OT Problem List: Decreased strength;Pain;Decreased range of motion;Decreased activity tolerance;Decreased safety awareness;Impaired balance (sitting and/or standing);Decreased knowledge of use of DME or AE;Decreased knowledge of precautions      OT Treatment/Interventions: Self-care/ADL training;Therapeutic exercise;Therapeutic activities;Energy conservation;DME and/or AE instruction;Patient/family education    OT  Goals(Current goals can be found in the care plan section) Acute Rehab OT Goals Patient Stated Goal: to get stronger OT Goal Formulation: With patient/family Time For Goal Achievement: 11/02/16 Potential to Achieve Goals: Good  OT Frequency: Min 2X/week   Barriers to D/C:            Co-evaluation              AM-PAC PT "6 Clicks" Daily Activity     Outcome Measure Help from another person eating meals?: None Help from another person taking care of personal grooming?: None Help from another person toileting, which includes using toliet, bedpan, or urinal?: A Lot Help from another person bathing (including washing, rinsing, drying)?: A Lot Help from another person to put on and taking off regular upper body clothing?: A [place] Help from another person to put on and taking off regular lower body clothing?: A Lot 6 Click Score: 17   End of Session    Activity Tolerance: Patient tolerated treatment well Patient left: in chair;with call bell/phone within reach;with chair alarm set;with family/visitor present  OT Visit Diagnosis: Other abnormalities of gait and mobility (R26.89);Muscle weakness (generalized) (M62.81);Repeated falls (R29.6);Pain Pain - Right/Left: Left Pain - part of body: Hip                Time: 1204-1218 OT Time Calculation (min): 14 min Charges:  OT General Charges $OT Visit: 1 Procedure OT Evaluation $OT Eval Low Complexity: 1 Procedure  G-Codes: OT G-codes **NOT FOR INPATIENT CLASS** Functional Assessment Tool Used: AM-PAC 6 Clicks Daily Activity;Clinical judgement Functional Limitation: Self care Self Care Current Status (F8421): At least 40 percent but less than 60 percent impaired, limited or restricted Self Care Goal Status (I3128): At least 20 percent but less than 40 percent impaired, limited or restricted   Jeni Salles, MPH, MS, OTR/L ascom [phone] 10/19/16, 1:24 PM

## 2016-10-19 NOTE — Progress Notes (Signed)
Physical Therapy Treatment Patient Details Name: [name] MRN: [id number] DOB: 1933/09/22 Today's Date: 10/19/2016    History of Present Illness 83 y/o male admitted on 10/13/16 for a L periprosthetic hip fracture (minimally displaced) s/p a fall, plan for conservative/non-op management at this time (TTWB L LE).  Hospital course significant for respiratory decline/distress requiring transfer to CCU for BiPAP; now returned to floor, weaned to [place] (4L) and cleared to resumption of PT.  PMH includes L THA with revision, AAA, nasal cancer, chronic a-fib, CAD, COPD, CKD stage III, diastolic CHF, PVD, HTN, HLD, tachy-brady syndrome, depression and pacemaker.     PT Comments    Pt agreeable to PT; reports 6/10 pain in left hip and breathing difficulty today. O2 saturation on room air 93%. Pt participates in seated and long sit exercises in chair. Lateral scoot performed to the right bed to chair with Min A; good use of upper extremities with cues required to avoid pressure through Left lower extremity. Continue PT to progress strength, endurance to improve functional mobility and progress to stand tolerance.    Follow Up Recommendations  SNF     Equipment Recommendations  None recommended by PT    Recommendations for Other Services       Precautions / Restrictions Precautions Precautions: Fall Restrictions Weight Bearing Restrictions: Yes LLE Weight Bearing: Touchdown weight bearing    Mobility  Bed Mobility Overal bed mobility: Needs Assistance Bed Mobility: Supine to Sit     Supine to sit: Min assist     General bed mobility comments: For trunk  Transfers Overall transfer level: Needs assistance Equipment used: None Transfers: Lateral/Scoot Transfers          Lateral/Scoot Transfers: Min assist General transfer comment: Pt demonstrates good use of upper body to scoot towards the R; cues for touchdown use of LLE only. Mostly compliant  Ambulation/Gait             General Gait Details: unable currently   Stairs            Wheelchair Mobility    Modified Rankin (Stroke Patients Only)       Balance                                            Cognition Arousal/Alertness: Awake/alert Behavior During Therapy: WFL for tasks assessed/performed Overall Cognitive Status: Within Functional Limits for tasks assessed                                        Exercises General Exercises - Lower Extremity Ankle Circles/Pumps: AROM;Both;20 reps;Supine Quad Sets: Strengthening;Both;10 reps Gluteal Sets: Strengthening;Both;10 reps Long Arc Quad: AROM;Strengthening;Both;10 reps;Seated Heel Slides: AAROM;Left;10 reps (self assisted, 10x R)    General Comments        Pertinent Vitals/Pain Pain Assessment: Faces Faces Pain Scale: Hurts even more Pain Location: L hip Pain Descriptors / Indicators: Sore;Grimacing;Guarding Pain Intervention(s): Repositioned;Monitored during session;Limited activity within patient's tolerance    Home Living                      Prior Function            PT Goals (current goals can now be found in the care plan section) Progress towards PT goals: Progressing  toward goals    Frequency    7X/week      PT Plan Current plan remains appropriate    Co-evaluation              AM-PAC PT "6 Clicks" Daily Activity  Outcome Measure  Difficulty turning over in bed (including adjusting bedclothes, sheets and blankets)?: Total Difficulty moving from lying on back to sitting on the side of the bed? : Total Difficulty sitting down on and standing up from a chair with arms (e.g., wheelchair, bedside commode, etc,.)?: Total Help needed moving to and from a bed to chair (including a wheelchair)?: A Feher (lateral scoot) Help needed walking in hospital room?: A Lot Help needed climbing 3-5 steps with a railing? : Total 6 Click Score: 9    End of Session  Equipment Utilized During Treatment: Gait belt Activity Tolerance: Patient limited by pain;Patient limited by fatigue;Other (comment) (SOB) Patient left: in chair;with call bell/phone within reach;with chair alarm set;with family/visitor present   PT Visit Diagnosis: Other abnormalities of gait and mobility (R26.89);Muscle weakness (generalized) (M62.81);Pain Pain - Right/Left: Left Pain - part of body: Hip     Time: 2233-6122 PT Time Calculation (min) (ACUTE ONLY): 30 min  Charges:  $Therapeutic Exercise: 8-22 mins $Therapeutic Activity: 8-22 mins                    G Codes:        Larae Grooms, PTA 10/19/2016, 11:31 AM

## 2016-10-19 NOTE — Care Management Important Message (Signed)
Important Message  Patient Details  Name: COULTER OLDAKER MRN: [id number] Date of Birth: 1934/02/15   Medicare Important Message Given:  Yes    Jolly Mango, RN 10/19/2016, 1:55 PM

## 2016-10-20 DIAGNOSIS — J449 Chronic obstructive pulmonary disease, unspecified: Secondary | ICD-10-CM | POA: Diagnosis not present

## 2016-10-20 DIAGNOSIS — N189 Chronic kidney disease, unspecified: Secondary | ICD-10-CM | POA: Diagnosis not present

## 2016-10-20 DIAGNOSIS — I4891 Unspecified atrial fibrillation: Secondary | ICD-10-CM | POA: Diagnosis not present

## 2016-10-20 DIAGNOSIS — S72009A Fracture of unspecified part of neck of unspecified femur, initial encounter for closed fracture: Secondary | ICD-10-CM | POA: Diagnosis not present

## 2016-10-24 ENCOUNTER — Telehealth: Payer: Self-pay | Admitting: Internal Medicine

## 2016-10-24 NOTE — Telephone Encounter (Signed)
Pt has fallen and broke his hip on 10/13/2016 and is now residing in [facility] Will not be able to make Thursday appt Please call to discuss care plan

## 2016-10-25 NOTE — Telephone Encounter (Signed)
Spoke with [name] who stated that patient will be at [facility]. I encouraged her to take his home monitor when she went to see him next and we would keep his current remote check appointment. She agreed with this plan.

## 2016-10-25 NOTE — Telephone Encounter (Signed)
Patient scheduled for a remote pacer check on 7/19.  Will forward to Device clinic to review.

## 2016-10-26 DIAGNOSIS — R2689 Other abnormalities of gait and mobility: Secondary | ICD-10-CM | POA: Diagnosis not present

## 2016-10-27 ENCOUNTER — Ambulatory Visit (INDEPENDENT_AMBULATORY_CARE_PROVIDER_SITE_OTHER): Payer: Medicare HMO | Admitting: *Deleted

## 2016-10-27 DIAGNOSIS — I495 Sick sinus syndrome: Secondary | ICD-10-CM

## 2016-10-27 NOTE — Progress Notes (Signed)
Remote pacemaker transmission.   

## 2016-10-28 LAB — CUP PACEART REMOTE DEVICE CHECK
Battery Impedance: 6479 Ohm
Battery Voltage: 2.63 V
Date Time Interrogation Session: 20180719132112
Implantable Lead Implant Date: 20100205
Implantable Lead Location: 753860
Implantable Pulse Generator Implant Date: 20100205
Lead Channel Impedance Value: 397 Ohm
Lead Channel Pacing Threshold Pulse Width: 0.4 ms
Lead Channel Setting Sensing Sensitivity: 4 mV
MDC IDC MSMT BATTERY REMAINING LONGEVITY: 3 mo
MDC IDC MSMT LEADCHNL RA IMPEDANCE VALUE: 0 Ohm
MDC IDC MSMT LEADCHNL RV PACING THRESHOLD AMPLITUDE: 0.875 V
MDC IDC SET LEADCHNL RV PACING AMPLITUDE: 2.5 V
MDC IDC SET LEADCHNL RV PACING PULSEWIDTH: 0.4 ms
MDC IDC STAT BRADY RV PERCENT PACED: 81 %

## 2016-10-31 DIAGNOSIS — R2689 Other abnormalities of gait and mobility: Secondary | ICD-10-CM | POA: Diagnosis not present

## 2016-11-03 ENCOUNTER — Encounter: Payer: Self-pay | Admitting: Cardiology

## 2016-11-03 DIAGNOSIS — R2689 Other abnormalities of gait and mobility: Secondary | ICD-10-CM | POA: Diagnosis not present

## 2016-11-04 DIAGNOSIS — S72009A Fracture of unspecified part of neck of unspecified femur, initial encounter for closed fracture: Secondary | ICD-10-CM | POA: Diagnosis not present

## 2016-11-04 DIAGNOSIS — N189 Chronic kidney disease, unspecified: Secondary | ICD-10-CM | POA: Diagnosis not present

## 2016-11-04 DIAGNOSIS — J449 Chronic obstructive pulmonary disease, unspecified: Secondary | ICD-10-CM | POA: Diagnosis not present

## 2016-11-04 DIAGNOSIS — I4891 Unspecified atrial fibrillation: Secondary | ICD-10-CM | POA: Diagnosis not present

## 2016-11-09 DIAGNOSIS — S72009A Fracture of unspecified part of neck of unspecified femur, initial encounter for closed fracture: Secondary | ICD-10-CM | POA: Diagnosis not present

## 2016-11-09 DIAGNOSIS — I509 Heart failure, unspecified: Secondary | ICD-10-CM | POA: Diagnosis not present

## 2016-11-09 DIAGNOSIS — J449 Chronic obstructive pulmonary disease, unspecified: Secondary | ICD-10-CM | POA: Diagnosis not present

## 2016-11-09 DIAGNOSIS — Z9181 History of falling: Secondary | ICD-10-CM | POA: Diagnosis not present

## 2016-11-10 DIAGNOSIS — Z9181 History of falling: Secondary | ICD-10-CM | POA: Diagnosis not present

## 2016-11-10 DIAGNOSIS — S72009A Fracture of unspecified part of neck of unspecified femur, initial encounter for closed fracture: Secondary | ICD-10-CM | POA: Diagnosis not present

## 2016-11-10 DIAGNOSIS — I509 Heart failure, unspecified: Secondary | ICD-10-CM | POA: Diagnosis not present

## 2016-11-13 DIAGNOSIS — R2689 Other abnormalities of gait and mobility: Secondary | ICD-10-CM | POA: Diagnosis not present

## 2016-11-13 DIAGNOSIS — S72012S Unspecified intracapsular fracture of left femur, sequela: Secondary | ICD-10-CM | POA: Diagnosis not present

## 2016-11-13 DIAGNOSIS — I509 Heart failure, unspecified: Secondary | ICD-10-CM | POA: Diagnosis not present

## 2016-11-13 DIAGNOSIS — S82842A Displaced bimalleolar fracture of left lower leg, initial encounter for closed fracture: Secondary | ICD-10-CM | POA: Diagnosis not present

## 2016-11-13 DIAGNOSIS — I5032 Chronic diastolic (congestive) heart failure: Secondary | ICD-10-CM | POA: Diagnosis not present

## 2016-11-13 DIAGNOSIS — R2681 Unsteadiness on feet: Secondary | ICD-10-CM | POA: Diagnosis not present

## 2016-11-13 DIAGNOSIS — J449 Chronic obstructive pulmonary disease, unspecified: Secondary | ICD-10-CM | POA: Diagnosis not present

## 2016-12-10 DEATH — deceased

## 2016-12-12 IMAGING — CR RIGHT ANKLE - 2 VIEW
1 series · 2 of 2 positions shown · non-contrast
Comparison: None.

CLINICAL DATA: Right ankle pain and deformity following a fall this
morning.

EXAM:
RIGHT ANKLE - 2 VIEW

[Series 1: ap · 0.17mm/px · 2 of 2 slices shown]
[im 1/2]
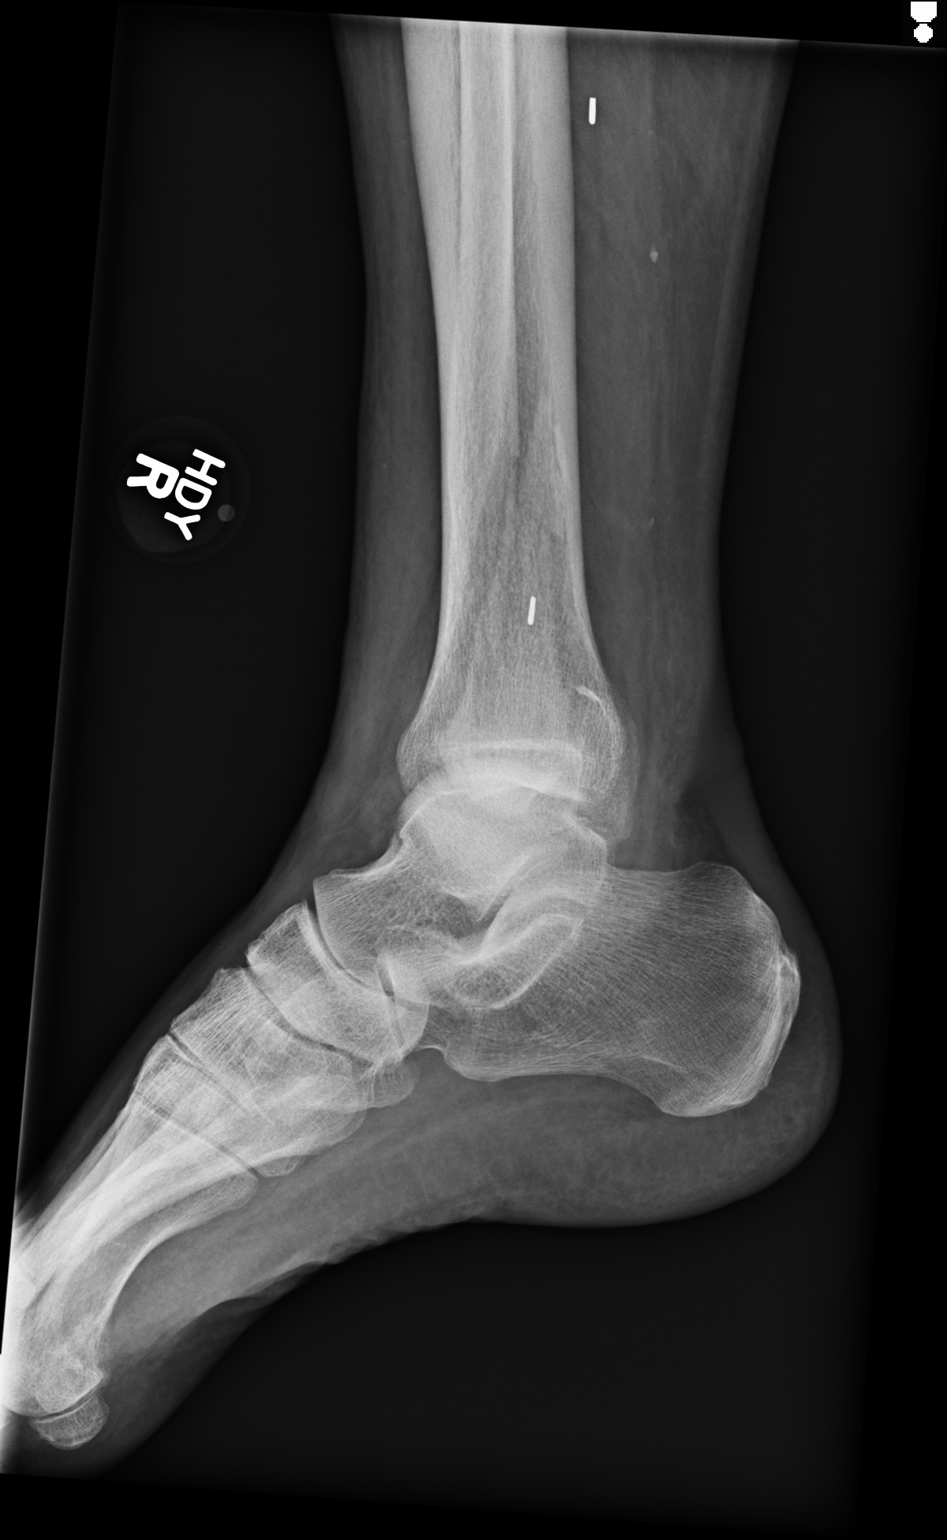
[im 2/2]
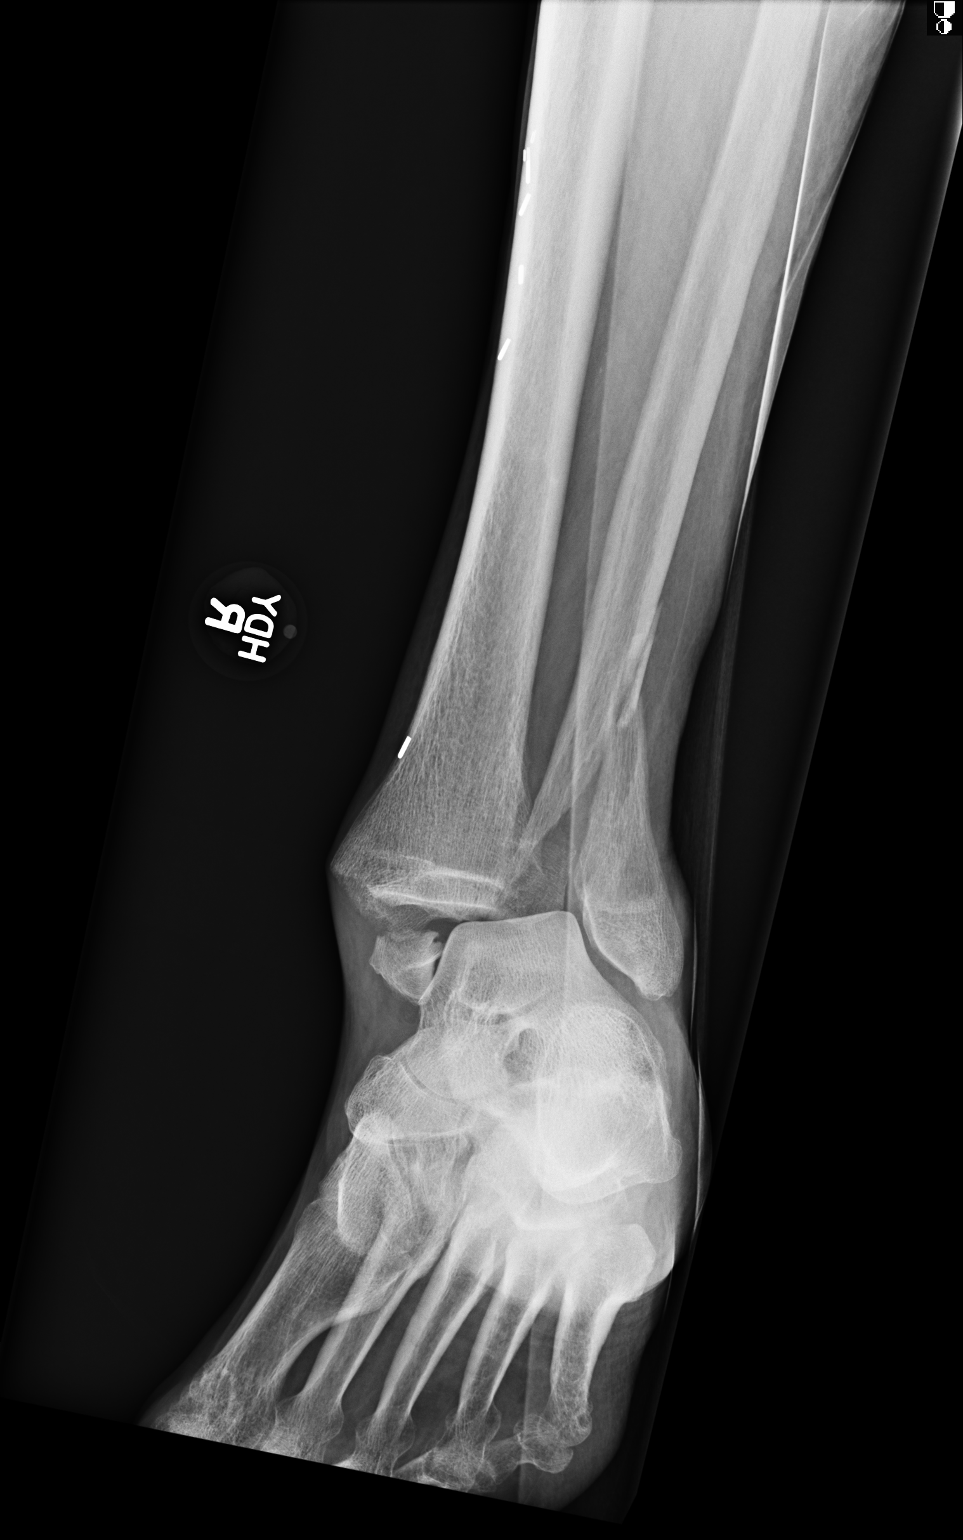

[2 of 2 positions shown; findings below may reference images not displayed]

FINDINGS: AP lateral views of the right ankle demonstrate a comminuted
fracture of the distal fibula extending into the proximal lateral
malleolus with almost 1 shaft width of lateral displacement as well
as lateral angulation of the distal fragment. There is also a
fracture through the proximal aspect of the medial malleolus with
marked lateral displacement of the distal fragment. There is also
lateral displacement and angulation of the talus and remainder the
foot relative to the distal tibia. Vascular clips are noted in the
lower leg. The posterior malleolus appears intact.
IMPRESSION: Bimalleolar fracture/subluxation, as described above.

## 2017-08-24 IMAGING — CR DG ANKLE 2V *L*
1 series · 2 of 2 positions shown · non-contrast
Comparison: Plain films earlier today.

CLINICAL DATA: Status post fall today with medial and lateral
malleolar fractures. Postreduction films. Initial encounter.

EXAM:
LEFT ANKLE - 2 VIEW

[Series 1: ap · 0.17mm/px · 2 of 2 slices shown]
[im 1/2]
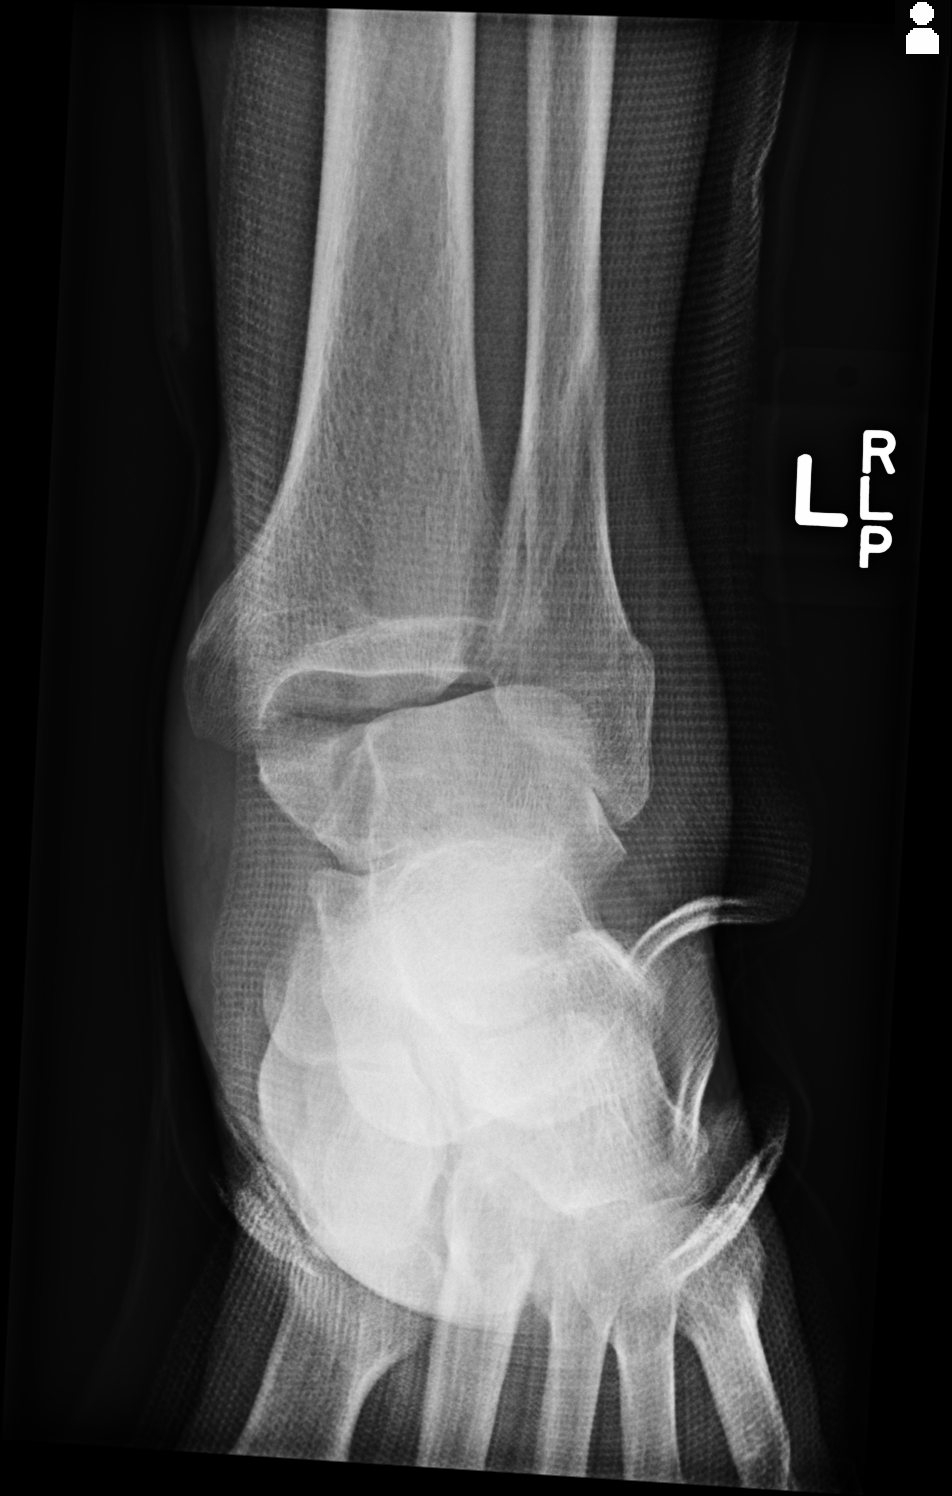
[im 2/2]
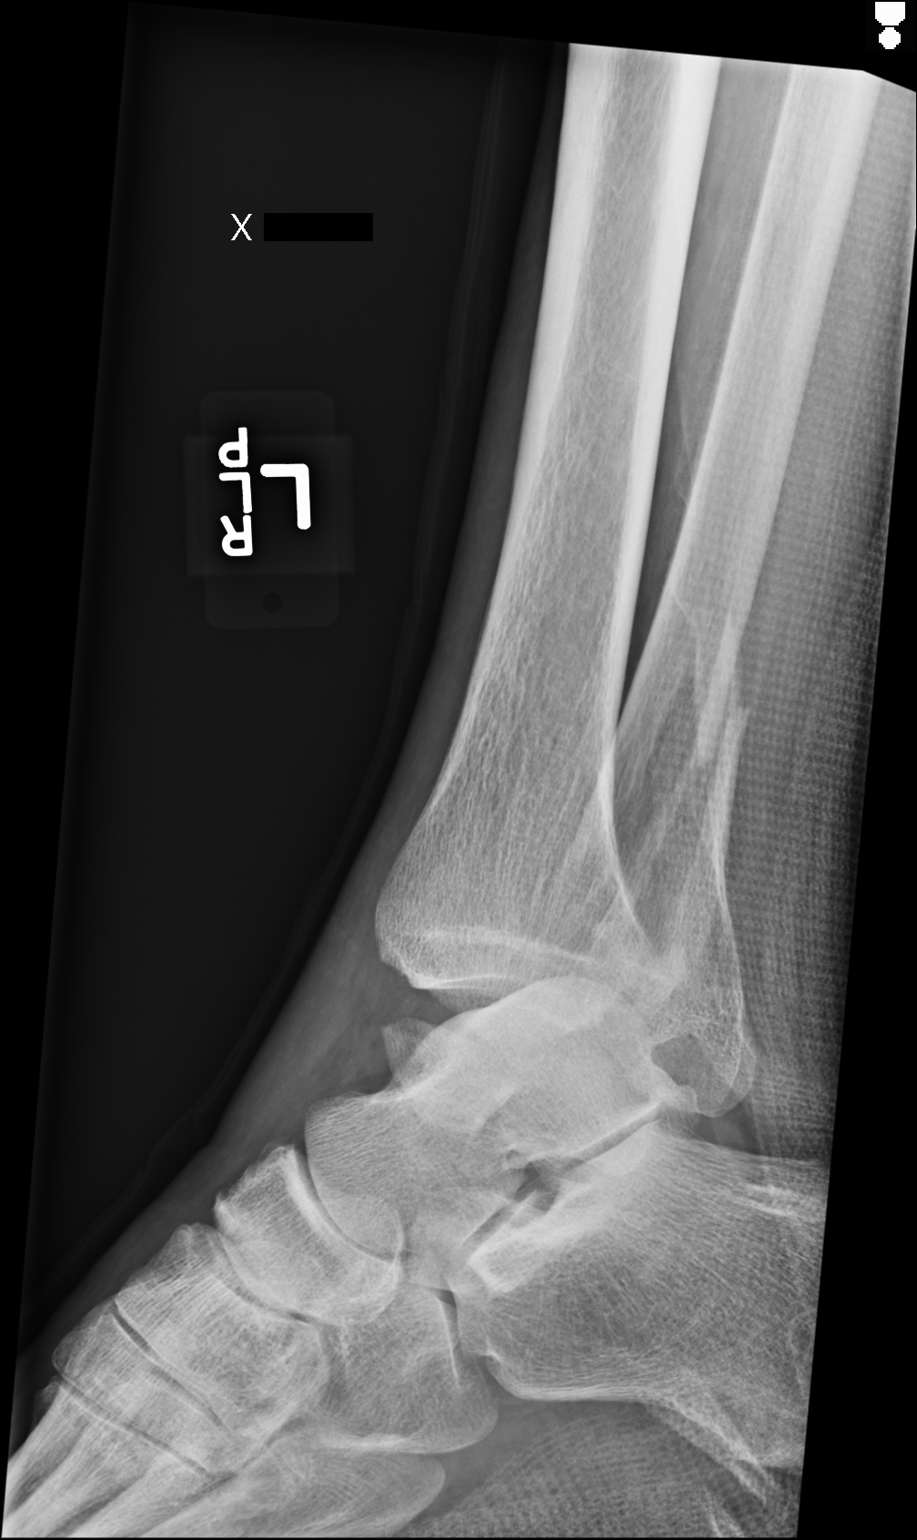

[2 of 2 positions shown; findings below may reference images not displayed]

FINDINGS: Marked lateral and posterior subluxation of the talus at the
tibiotalar joint is unchanged. Medial and lateral malleolar
fractures are again seen without change in position or alignment.
The patient is in a fiberglass splint.
IMPRESSION: No marked change in position or alignment of medial and lateral
malleolar fractures and medial and posterior subluxation of the
talus at the tibiotalar joint.

## 2017-08-24 IMAGING — CR DG ANKLE COMPLETE 3+V*L*
1 series · 3 of 3 positions shown · non-contrast
Comparison: None.

CLINICAL DATA: Fell today and injured left ankle.

EXAM:
LEFT ANKLE COMPLETE - 3+ VIEW

[Series 1: ap · 0.17mm/px · 3 of 3 slices shown]
[im 1/3]
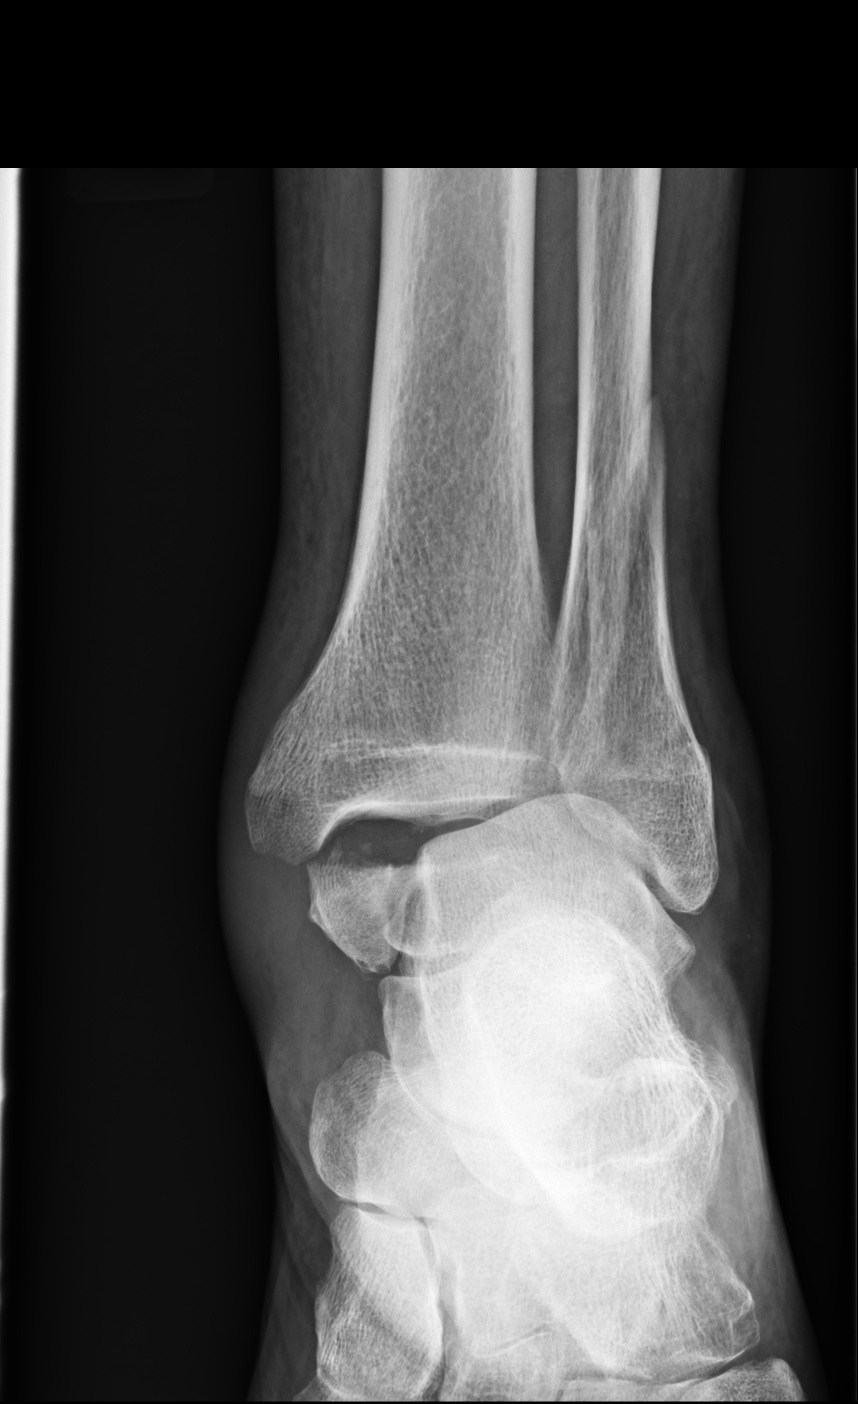
[im 2/3]
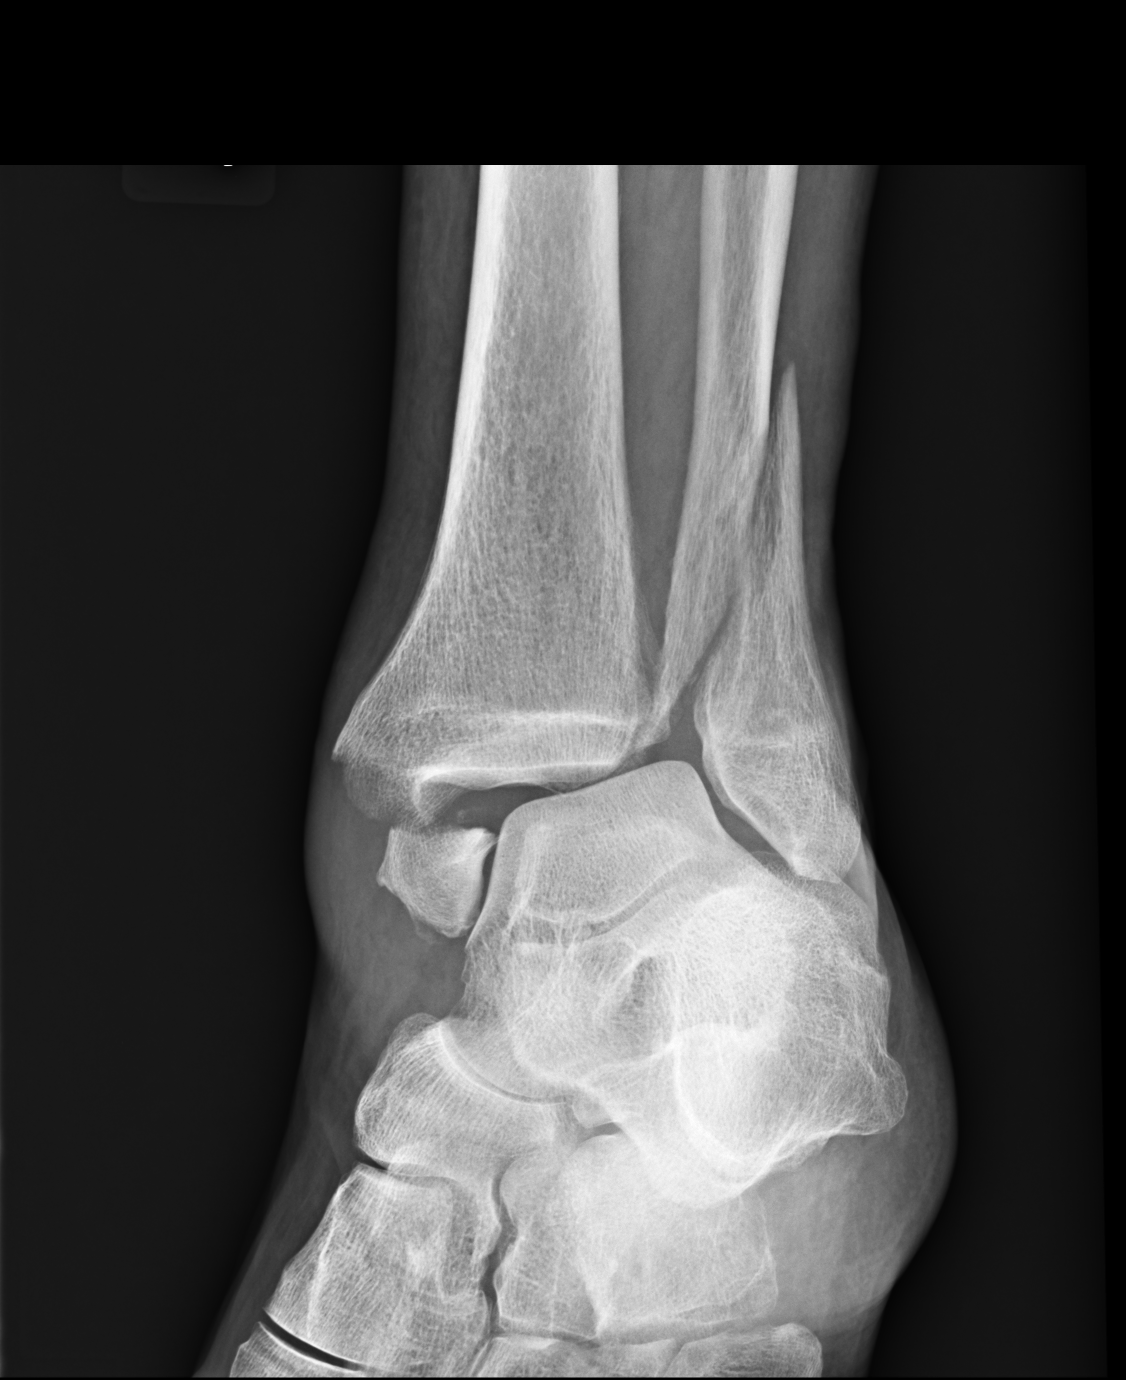
[im 3/3]
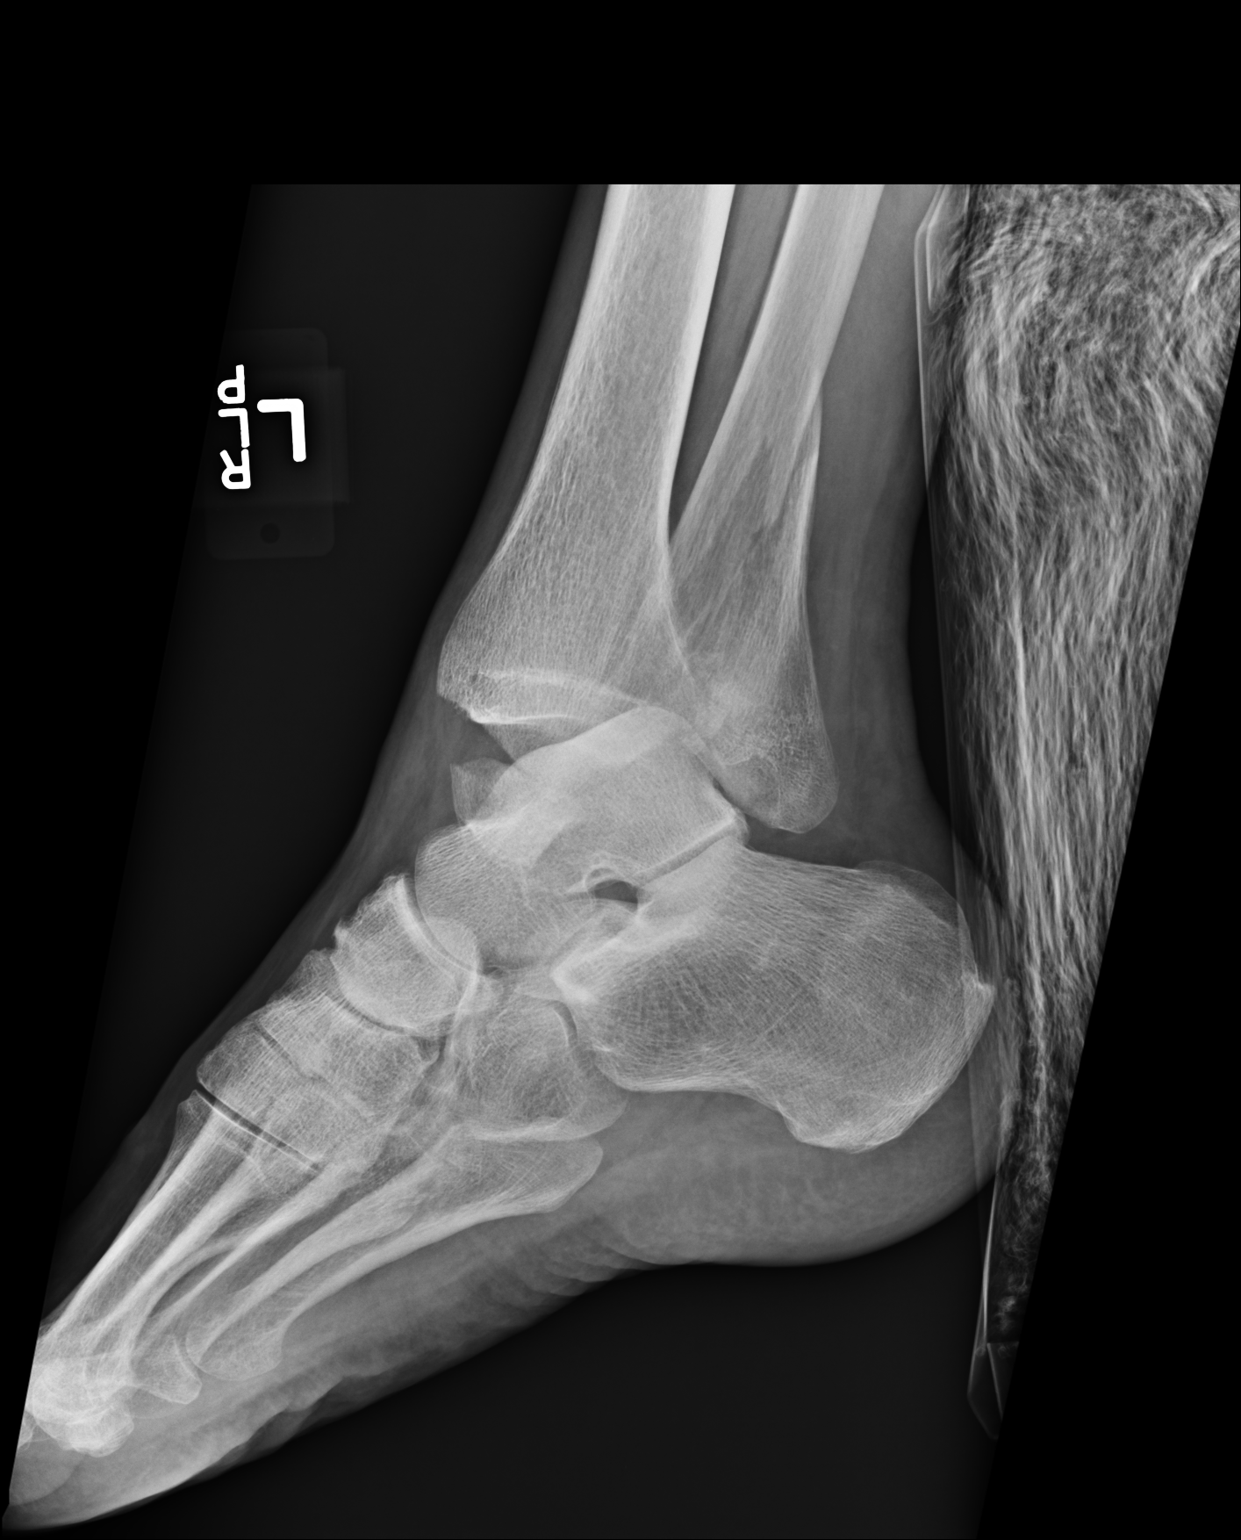

[3 of 3 positions shown; findings below may reference images not displayed]

FINDINGS: Bimalleolar ankle fractures are demonstrated. There is a displaced
transverse fracture through the medial malleolus at the level of the
ankle mortise. The talus is subluxed laterally approximately 1 cm.
There is also a long oblique displaced fracture of the distal
fibular shaft above the ankle mortise. No talus fracture is
identified. The subtalar joints are maintained.
IMPRESSION: Displaced bimalleolar ankle fractures with moderate lateral
subluxation of the talus in relation to the tibia.

## 2018-05-28 IMAGING — US US CAROTID DUPLEX BILAT
1 series · 13 of 24 positions shown · non-contrast
Comparison: CT 11/24/2015.

CLINICAL DATA: Syncope.

EXAM:
BILATERAL CAROTID DUPLEX ULTRASOUND
TECHNIQUE: Gray scale imaging, color Doppler and duplex ultrasound were
performed of bilateral carotid and vertebral arteries in the neck.

[Series 1: us carotid duplex bilat · 0.08mm/px · 13 of 65 slices shown]
[im 1/65]
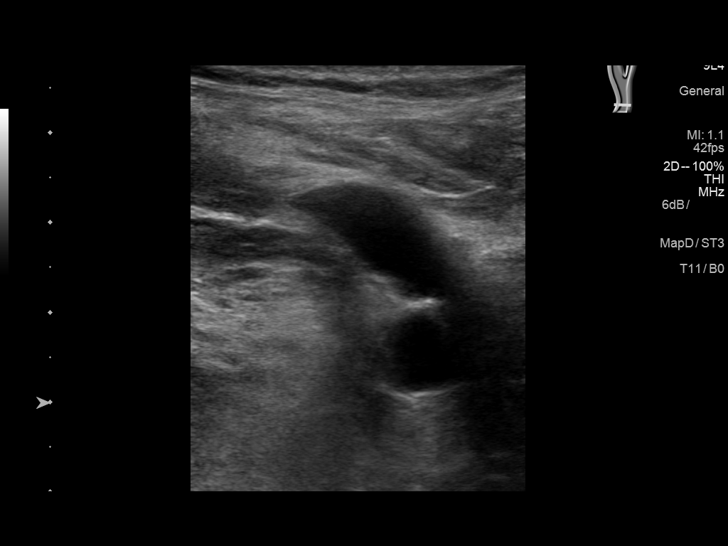
[im 6/65]
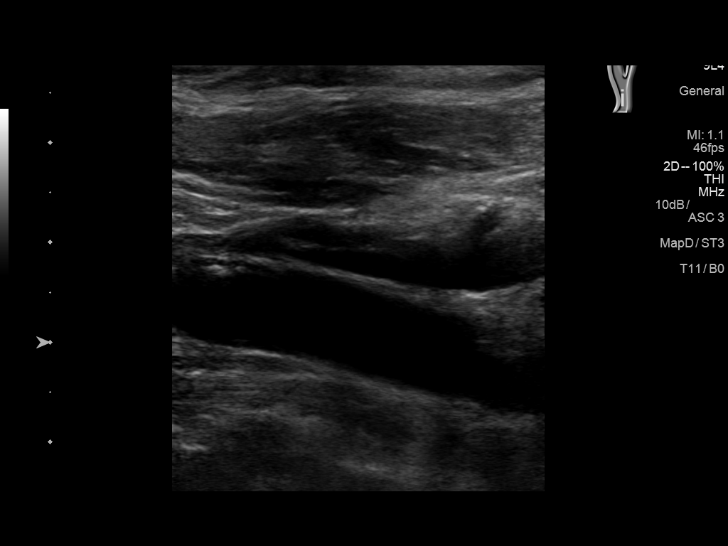
[im 12/65]
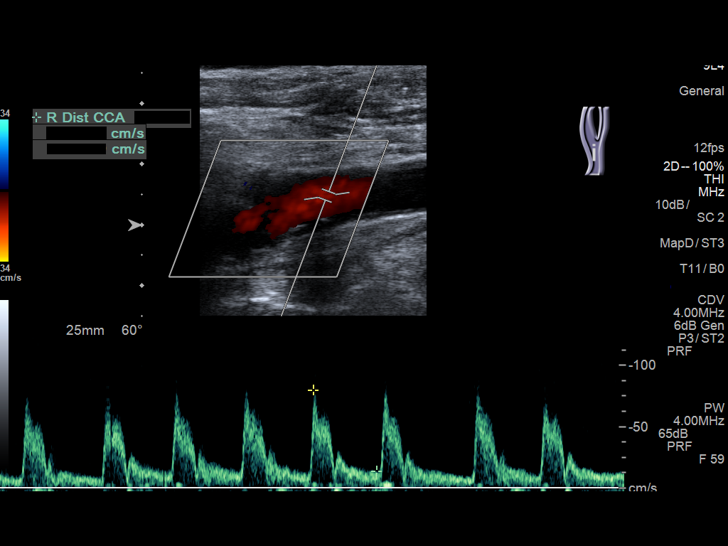
[im 17/65]
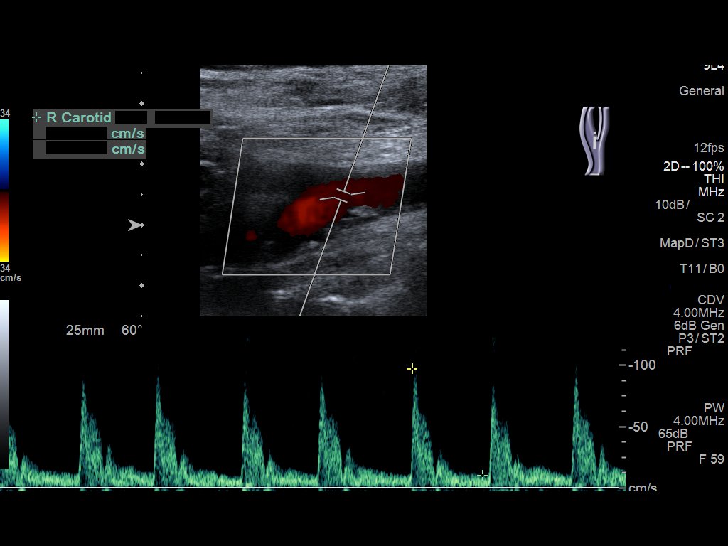
[im 23/65]
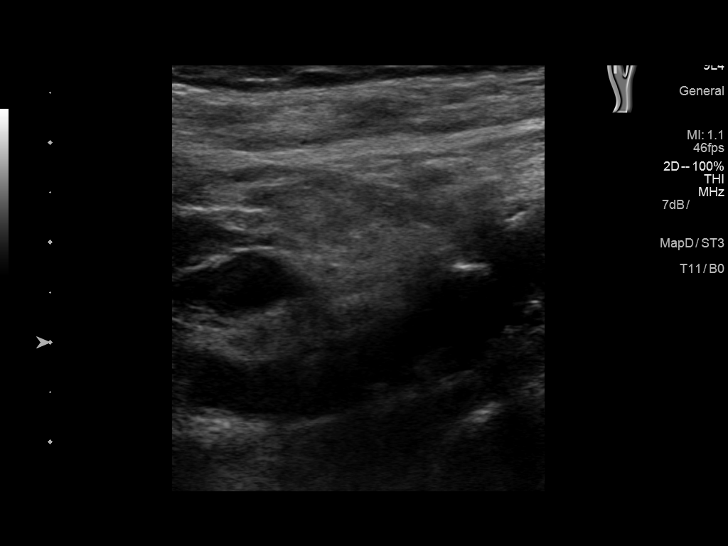
[im 28/65]
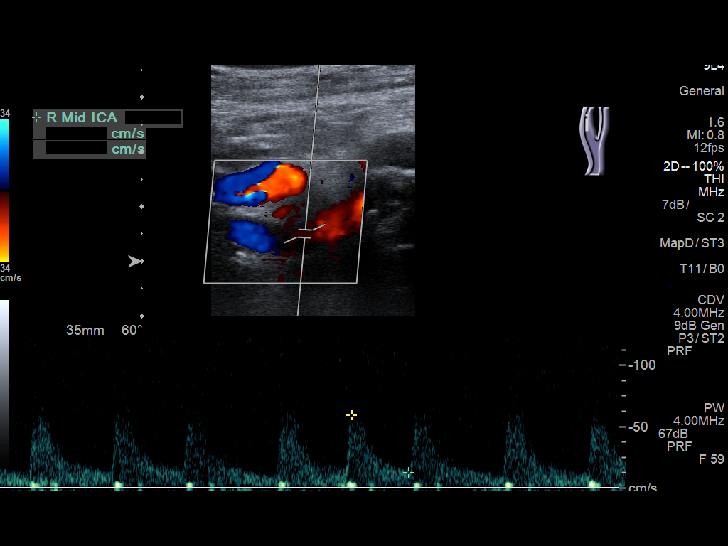
[im 34/65]
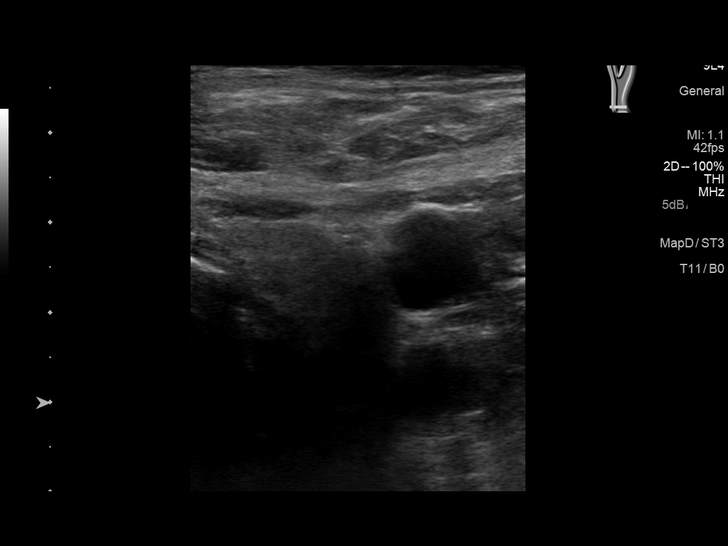
[im 37/65]
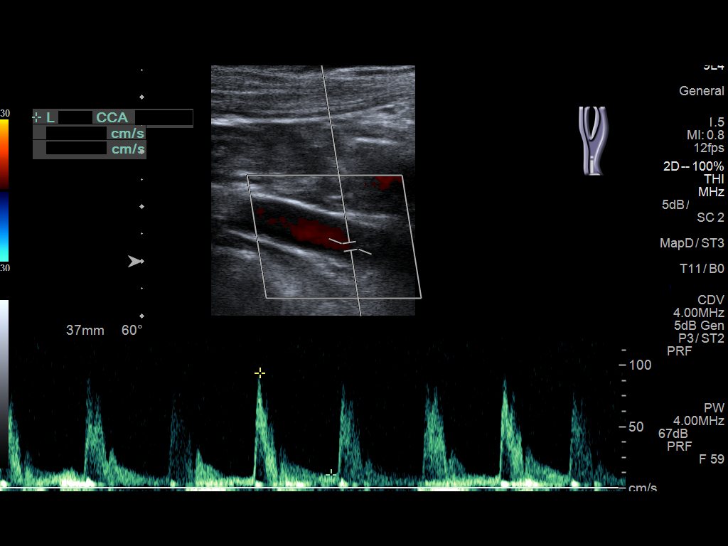
[im 42/65]
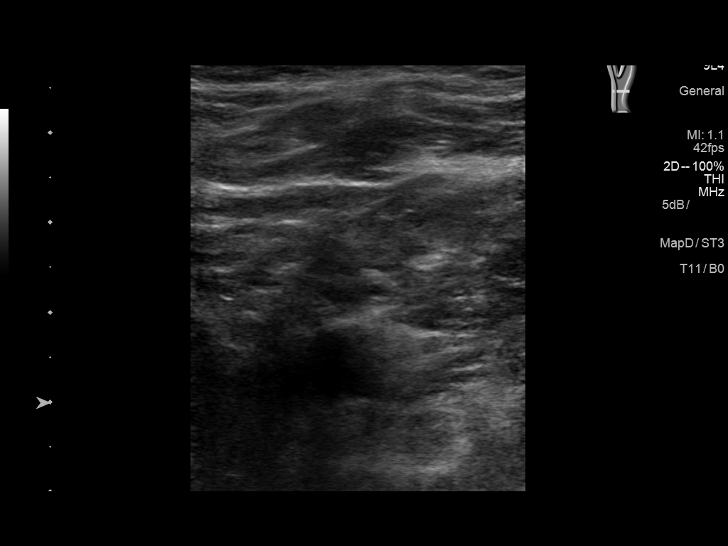
[im 48/65]
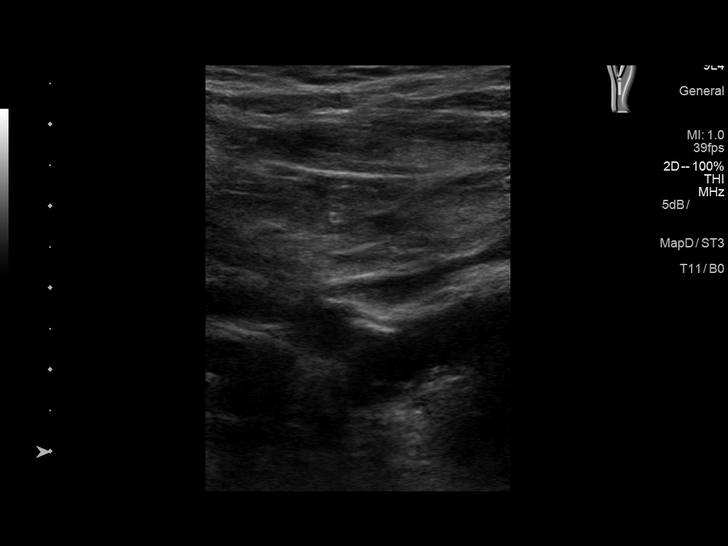
[im 53/65]
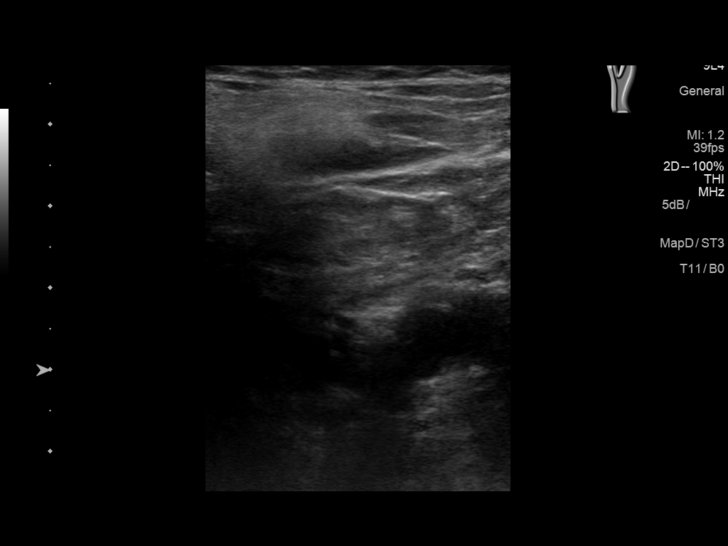
[im 59/65]
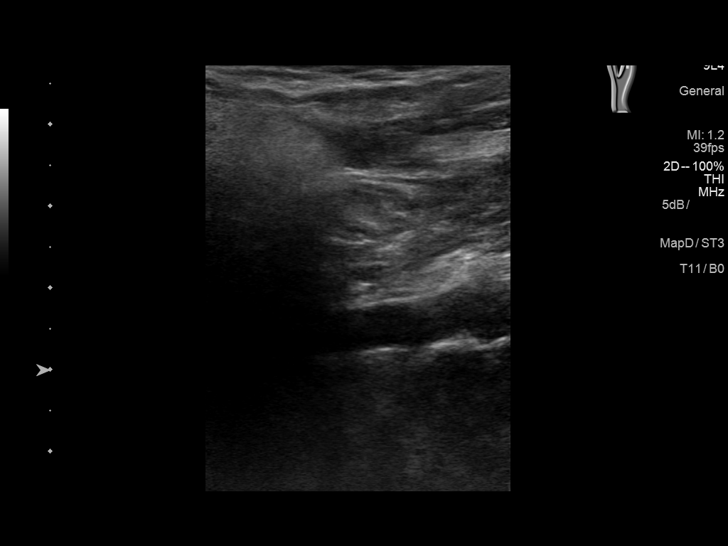
[im 65/65]
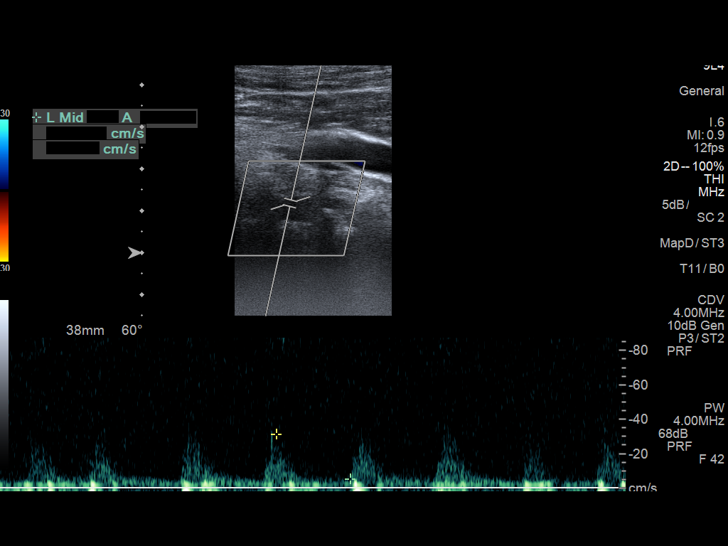

[13 of 24 positions shown; findings below may reference images not displayed]

FINDINGS: Criteria: Quantification of carotid stenosis is based on velocity
parameters that correlate the residual internal carotid diameter
with NASCET-based stenosis levels, using the diameter of the distal
internal carotid lumen as the denominator for stenosis measurement.

The following velocity measurements were obtained:

RIGHT

ICA:   101/14 cm/sec

CCA:  104/18 cm/sec

SYSTOLIC ICA/CCA RATIO:

DIASTOLIC ICA/CCA RATIO:

ECA:  98 cm/sec

LEFT

ICA:  127/14 cm/sec

CCA:  107/13 cm/sec

SYSTOLIC ICA/CCA RATIO:

DIASTOLIC ICA/CCA RATIO:

ECA:  103 cm/sec

RIGHT CAROTID ARTERY: Mild right carotid bifurcation atherosclerotic
vascular disease .

RIGHT VERTEBRAL ARTERY:  Patent with antegrade flow.

LEFT CAROTID ARTERY: Mild left carotid bifurcation atherosclerotic
vascular disease .

LEFT VERTEBRAL ARTERY: Patent antegrade flow .
IMPRESSION: 1. Mild bilateral carotid bifurcation atherosclerotic vascular
disease. No flow limiting stenosis.

2.  Vertebral arteries are patent with antegrade flow.

## 2018-07-11 IMAGING — DX DG CHEST 1V PORT
1 series · 1 of 1 positions shown · non-contrast
Comparison: 08/19/2015

CLINICAL DATA: Fall, syncope

EXAM:
PORTABLE CHEST 1 VIEW

[chest ap]
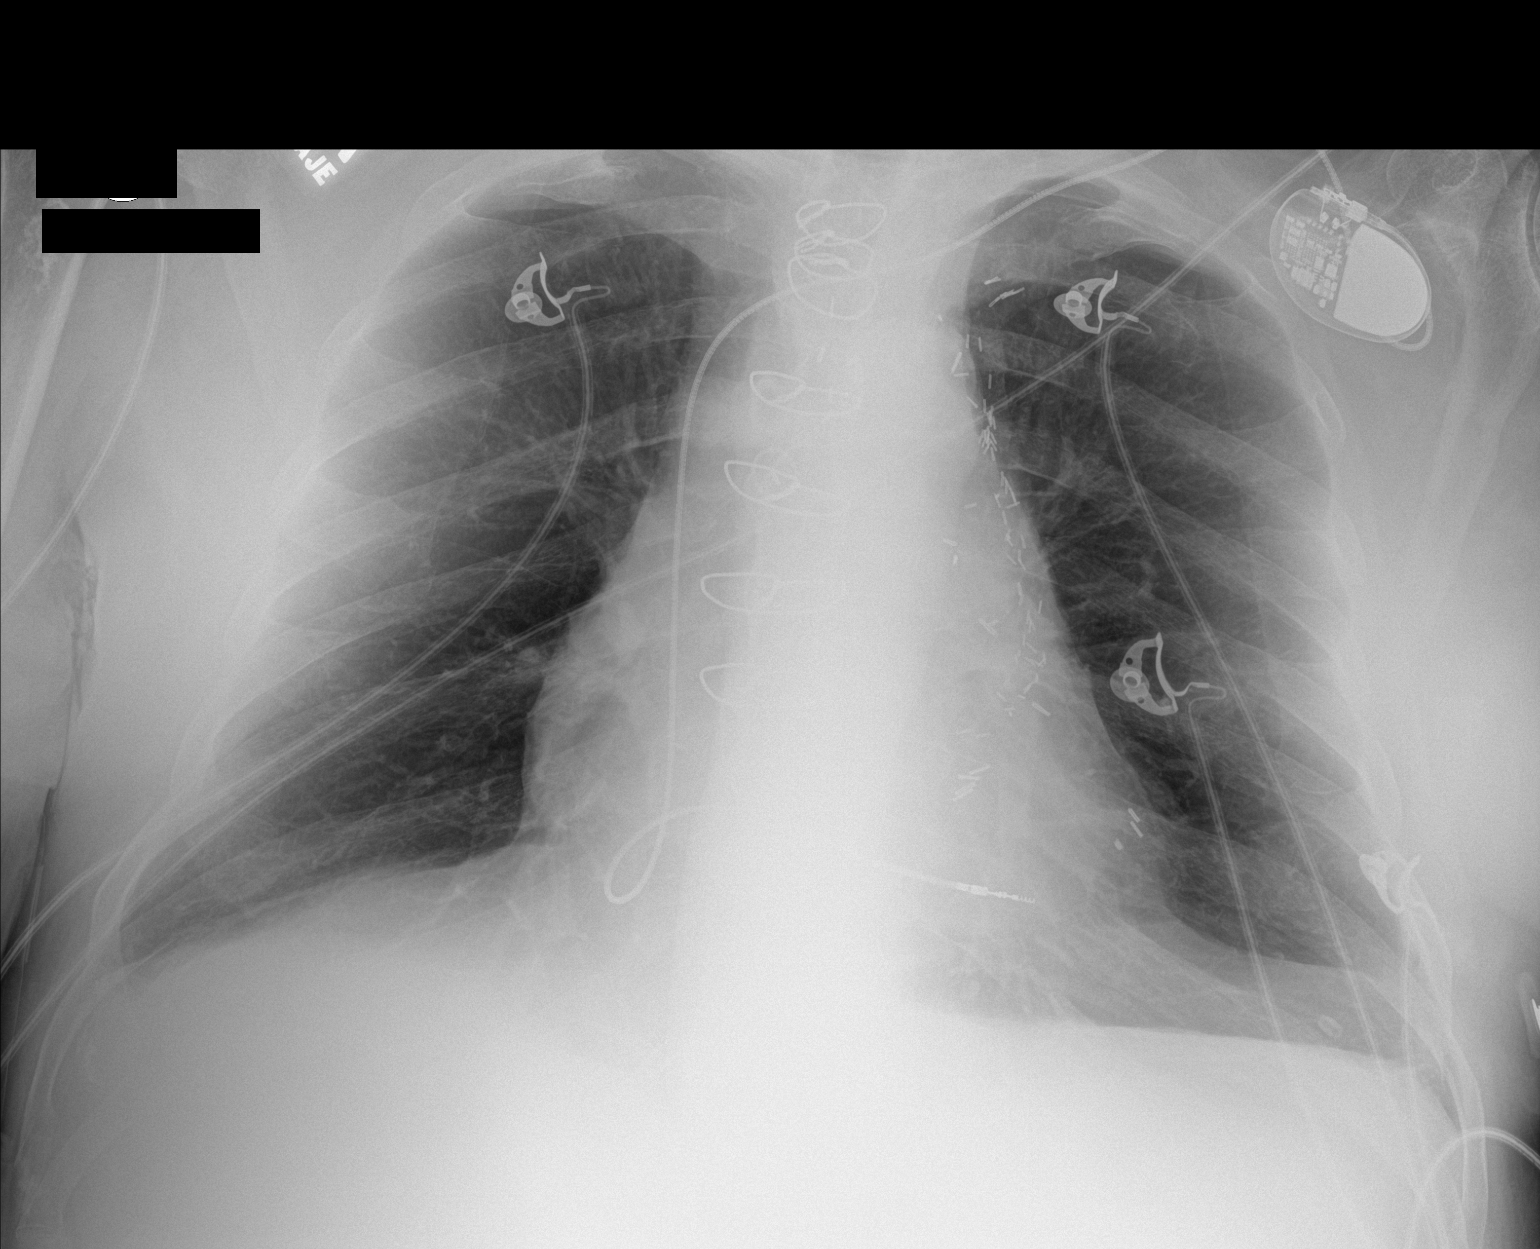

[1 of 1 positions shown; findings below may reference images not displayed]

FINDINGS: Postop CABG.  Single lead pacemaker unchanged.

Lungs are clear without infiltrate or effusion. Negative for heart
failure or effusion.
IMPRESSION: No active disease.
